# Patient Record
Sex: Male | Born: 1970 | Race: Black or African American | Hispanic: No | Marital: Single | Smoking: Current every day smoker
Health system: Southern US, Community
[De-identification: ages and names within clinical notes are randomized; demographics above are authoritative.]

## PROBLEM LIST (undated history)

## (undated) DIAGNOSIS — E876 Hypokalemia: Secondary | ICD-10-CM

## (undated) DIAGNOSIS — I209 Angina pectoris, unspecified: Secondary | ICD-10-CM

## (undated) DIAGNOSIS — I10 Essential (primary) hypertension: Secondary | ICD-10-CM

## (undated) DIAGNOSIS — I219 Acute myocardial infarction, unspecified: Secondary | ICD-10-CM

## (undated) DIAGNOSIS — I6529 Occlusion and stenosis of unspecified carotid artery: Secondary | ICD-10-CM

## (undated) DIAGNOSIS — F419 Anxiety disorder, unspecified: Secondary | ICD-10-CM

## (undated) DIAGNOSIS — E785 Hyperlipidemia, unspecified: Secondary | ICD-10-CM

## (undated) DIAGNOSIS — D649 Anemia, unspecified: Secondary | ICD-10-CM

## (undated) DIAGNOSIS — G4730 Sleep apnea, unspecified: Secondary | ICD-10-CM

## (undated) DIAGNOSIS — W3400XA Accidental discharge from unspecified firearms or gun, initial encounter: Secondary | ICD-10-CM

## (undated) DIAGNOSIS — I509 Heart failure, unspecified: Secondary | ICD-10-CM

## (undated) DIAGNOSIS — I2510 Atherosclerotic heart disease of native coronary artery without angina pectoris: Secondary | ICD-10-CM

## (undated) DIAGNOSIS — Z9289 Personal history of other medical treatment: Secondary | ICD-10-CM

## (undated) DIAGNOSIS — R569 Unspecified convulsions: Secondary | ICD-10-CM

## (undated) HISTORY — DX: Personal history of other medical treatment: Z92.89

## (undated) HISTORY — PX: HERNIA REPAIR: SHX51

## (undated) HISTORY — DX: Atherosclerotic heart disease of native coronary artery without angina pectoris: I25.10

## (undated) HISTORY — DX: Hyperlipidemia, unspecified: E78.5

## (undated) HISTORY — DX: Occlusion and stenosis of unspecified carotid artery: I65.29

## (undated) HISTORY — PX: KNEE SURGERY: SHX244

## (undated) HISTORY — PX: UMBILICAL HERNIA REPAIR: SHX196

---
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
## 2004-04-15 ENCOUNTER — Emergency Department (HOSPITAL_COMMUNITY): Admission: EM | Admit: 2004-04-15 | Discharge: 2004-04-15 | Payer: Self-pay | Admitting: Family Medicine

## 2004-10-01 ENCOUNTER — Inpatient Hospital Stay (HOSPITAL_COMMUNITY): Admission: EM | Admit: 2004-10-01 | Discharge: 2004-10-02 | Payer: Self-pay | Admitting: Emergency Medicine

## 2004-10-05 ENCOUNTER — Inpatient Hospital Stay (HOSPITAL_COMMUNITY): Admission: EM | Admit: 2004-10-05 | Discharge: 2004-10-07 | Payer: Self-pay | Admitting: Emergency Medicine

## 2004-10-29 ENCOUNTER — Emergency Department (HOSPITAL_COMMUNITY): Admission: EM | Admit: 2004-10-29 | Discharge: 2004-10-30 | Payer: Self-pay | Admitting: Emergency Medicine

## 2005-05-08 HISTORY — PX: CORONARY ANGIOPLASTY WITH STENT PLACEMENT: SHX49

## 2007-05-25 ENCOUNTER — Emergency Department (HOSPITAL_COMMUNITY): Admission: EM | Admit: 2007-05-25 | Discharge: 2007-05-25 | Payer: Self-pay | Admitting: Emergency Medicine

## 2008-03-17 ENCOUNTER — Observation Stay (HOSPITAL_COMMUNITY): Admission: EM | Admit: 2008-03-17 | Discharge: 2008-03-17 | Payer: Self-pay | Admitting: Emergency Medicine

## 2010-09-20 NOTE — H&P (Signed)
NAMENICCO, YAP           ACCOUNT NO.:  [id number]   MEDICAL RECORD NO.:  [id number]          PATIENT TYPE:  INP   LOCATION:  3707                         FACILITY:  [facility]   PHYSICIAN:  Belva Crome, M.D.   DATE OF BIRTH:  25-Sep-1970   DATE OF ADMISSION:  03/17/2008  DATE OF DISCHARGE:  03/17/2008                              HISTORY & PHYSICAL   CHIEF COMPLAINT:  Chest pain.   Mr. Chiappetta is a 40 year old male patient who has known coronary artery  disease.  He has a drug-eluting stent to the OM1 and a bare-metal stent  to the circumflex, which were placed in 2006.  He began having  substernal chest pain/pressure around 5:00 a.m. today.  He said it was a  pressure sensation and he had difficulty breathing.  He took a  sublingual nitroglycerin that helped somewhat, but he took it out of his  mouth before it fully dissolved.  He said it did give him a headache and  his pain did get somewhat better.  He says now that he feels better but  feels funny.  He drinks up to a fifth a liquor daily and he drank last  night and he took some ecstasy.  He denies any cocaine.   PAST MEDICAL HISTORY:  Hypertension and hyperlipidemia.   SOCIAL HISTORY:  Lives in [place], smokes two packs of cigarettes per  day.  Drinks up to fifth a liquor a day, uses ecstasy.   FAMILY HISTORY:  Dad had heart disease in his 34s.   ALLERGIES:  No known drug allergies.   MEDICATIONS:  None.   REVIEW OF SYSTEMS:  As above, otherwise negative.   PHYSICAL EXAM:  VITAL SIGNS:  Temperature 97.8, pulse 67, respirations  20, blood pressure 158/92, and O2 saturations 97% on room air.  GENERAL:  He is in no acute distress.  HEENT:  Grossly normal.  No carotid or subclavian bruits.  No JVD or  thyromegaly.  Sclerae clear.  Conjunctivae normal.  Nares without  drainage.  CHEST:  Clear to auscultation bilaterally.  No wheezing or rhonchi.  HEART:  Regular rate and rhythm.  No gross murmur.  ABDOMEN:  Good  bowel sounds, nontender, nontender, no abdominal masses.  EXTREMITIES:  No peripheral edema.  SKIN:  Warm and dry.  NEUROLOGIC:  Cranial nerves II through XII intact.  Normal mood and  affect, although he does smell of alcohol.   Chest x-ray, no active disease.   Labs today showed hemoglobin 16.8, hematocrit 50.5, and platelets 230,  white count 6.8.  Sodium 139, potassium 3.0, replete, BUN 9, creatinine  1.2, glucose 123, alcohol level 111.  EKG, normal sinus rhythm in the  80s within normal limits.   ASSESSMENT AND PLAN:  1. Chest pain.  2. Coronary artery disease.  3. Polysubstance abuse.   We will need to admit him to the hospital with cycle enzymes.  We will  get a urine drug screen as well as a Education officer, museum consult.  The patient  has been seen and examined by Dr. Daneen Schick.      Joesphine Bare, P.A.  Belva Crome, M.D.  Electronically Signed    LB/MEDQ  D:  03/17/2008  T:  03/17/2008  Job:  [id number]   cc:   Barnett Abu, M.D.

## 2010-09-23 NOTE — Cardiovascular Report (Signed)
Scott Salinas, Scott Salinas           ACCOUNT NO.:  [id number]   MEDICAL RECORD NO.:  [id number]          PATIENT TYPE:  INP   LOCATION:  6524                         FACILITY:  [facility]   PHYSICIAN:  Barnett Abu, M.D.  DATE OF BIRTH:  1971-03-28   DATE OF PROCEDURE:  10/06/2004  DATE OF DISCHARGE:                              CARDIAC CATHETERIZATION   PROCEDURES PERFORMED:  1.  Left heart catheterization.  2.  Coronary angiography.  3.  Left ventriculogram.  4.  PCI/drug-eluting stent implantation, first marginal branch.  5.  PCI/bare metal stent implantation, proximal circumflex.  6.  Percutaneous closure of right femoral artery.   INDICATIONS:  Scott Salinas is a 40 year old man who presented to [facility] initially one week ago complaining of chest pain. It  initially had exertion and then becoming  at rest. Troponin was mildly  elevated. An exercise treadmill test reproduced symptoms within the first  stage of Bruce protocol, and there was 1 mm ST-segment depression. Perfusion  images associated with this showed a mild reversible inferolateral defect.  He was scheduled for cardiac catheterization but signed out AMA. Two days  later he returned to the hospital, continuing to complain of chest pain.  Again, he was treated with heparin and IV nitroglycerin as well as  Integrilin. He did have another increase in his troponin enzymes. He is  brought to catheterization laboratory at this time to identify the extent of  disease and provide for further therapeutic options.   PROCEDURAL NOTE:  The patient brought to the cardiac catheterization  laboratory in a fasting state. The right groin was prepped and draped in  usual sterile fashion. Local anesthesia was obtained with the infiltration  of 1% lidocaine. A 6-French catheter sheath was inserted percutaneously into  the right femoral artery, utilizing an anterior approach over a guiding J-  wire. Left heart  catheterization then proceeded in the standard fashion,  with a 110 cm pigtail catheter and 6-French #4 left and right Judkins  catheters. All catheter manipulations were  performed using fluoroscopic  observation, and exchanges performed over a long guiding J-wire. At the  completion of the diagnostic procedure, the decision was made to proceed  with percutaneous coronary intervention.   The 6-French catheter sheath was exchanged for a 7-French catheter sheath  over a long  guiding J-wire. The patient received 6300 units of intravenous  heparin, for an ACT initially of approximately 170. He did arrive in the  catheterization laboratory on heparin and intravenous Integrilin. The  Integrilin was continued. The resultant ACT after 6100 units of heparin was  190. A 7-French #4 Voda left guiding catheter was advanced to the descending  aorta, where left coronary os was engaged. A 0.014 inch Luge intracoronary  guidewire was passed across the lesion to the proximal to mid circumflex  without difficulty, and on into a very large second marginal. The first  marginal was very small. The lesion in the mid circumflex was primarily  stented using a 4.0 x 16 mm SciMed Liberte'. This was carefully placed  across the lesion and deployed at peak pressure of  14 atm for approximately  35 sec. The stent balloon was deflated and removed. The patient received two  intracoronary doses of 0.2 mg nitroglycerin. A moderate stenosis in  the  first marginal appeared to have worsened/was more severe. This might be due  to nitroglycerin causing increased dilatation of the more normal artery or  because of increased flow. I decided to treat this lesion as well. A 3.20  x  28 mm SciMed Taxus drug-eluting intracoronary stent was advanced into place,  carefully positioned and deployed at peak pressure of 14 atm for 39 sec. The  stent balloon was removed and a 3.5 x 15 mm SciMed Quantum Maverick  intracoronary balloon was  advanced into place. The artery was larger in the  more distal portion of the stent than the proximal portion. Therefore the  3.5 mm diameter balloon was used in the more mid to distal portion of the  stent for post dilatation. It was inflated at 12 atm for 56 sec. It was  deflated and removed. Following confirmation of adequate patency in  orthogonal views, both with and without the guidewire in place, the guiding  catheter was removed.   A right femoral arteriogram in the 45 degree RAO angulation was performed,  via the catheter sheath by hand injection. It documented adequate anatomy  for placement of the percutaneous closure device AngioSeal. This was  deployed successfully with good hemostasis and an intact distal pulse.   HEMODYNAMIC RESULTS:  1.  Systemic arterial pressure:  115/77 with a mean of 96 mmHg.  2.  There was no systolic gradient across the aortic valve.  3.  Left ventricular end-diastolic pressure was 13 mmHg.   ANGIOGRAPHY:  The left ventriculogram demonstrated normal chamber size and  normal global systolic function. There was dense inferoposterior/  diaphragmatic akinesis. The basal portion of the inferior wall moved  normally, as did the apical inferior wall and the inferolateral wall.  A  visual estimate of the ejection fraction is 50-55%.  There was no  significant mitral regurgitation.  There was no significant coronary  calcification.   There was a right dominant coronary system present; although the right  coronary was rather small.   1.  LEFT MAIN CORONARY ARTERY:  Short, large and normal.  2.  LEFT ANTERIOR DESCENDING ARTERY: (and its branches) Minimally diseased;      there are luminal irregularities in the vessel and there is a 30%      stenosis at the junction of the mid and distal segments,  after the      origin of the third diagonal branch (which follows shortly after the      second diagonal branch). The third diagonal branch itself has 50%      stenosis about 1 cm from its origin. Both the second and third diagonal      branches are relatively small.  The first diagonal branch is moderate in      size. The ongoing anterior descending artery is large, transapical and      diffusely diseased, but not obstructed at all.  3.  LEFT CIRCUMFLEX CORONARY ARTERY: (and its branches) Highly diseased; the      vessel gives rise proximally to a tiny marginal branch. The anatomic      second marginal branch is large and dominant vessel on the the lateral      and  the inferolateral wall of the heart. The ongoing circumflex in the      AV  groove gives rise to only a small posterolateral branch. In the      proximal segment there is a focal concentric 90% stenosis. Initial      diagnostic angiography,  the lesion in the marginal branch is diffuse;      but it did not appear to be than 50% stenotic in any segment.  4.  RIGHT CORONARY ARTERY:  Small but anatomically dominant. It gives rise      to a very small posterior descending artery, which does not leave the      basal segment of the LV inferoseptal wall.  Collateral vessels are not      seen.   Following balloon dilatation and stent implantation,  there is a 10%  residual stenosis in the proximal circumflex. There is no residual stenosis  in the marginal branch.   FINAL IMPRESSION:  1.  Atherosclerotic coronary vascular disease, single vessel.  2.  Intact global left ventricular systolic function with regional wall      motion abnormalities noted.  3.  Typical angina was reproduced with device insertion and balloon      inflation.      JHE/MEDQ  D:  10/06/2004  T:  10/06/2004  Job:  [id number]

## 2010-09-23 NOTE — Discharge Summary (Signed)
NAME:  ADRICK, SCHNEIDERS           ACCOUNT NO.:  [id number]   MEDICAL RECORD NO.:  [id number]          PATIENT TYPE:  INP   LOCATION:  [id number]                         FACILITY:  [facility]   PHYSICIAN:  Corinna L. Conley Canal, MDDATE OF BIRTH:  1971-02-06   DATE OF ADMISSION:  09/30/2004  DATE OF DISCHARGE:  10/02/2004                                 DISCHARGE SUMMARY   The patient left against medical advice on Oct 02, 2004.   DIAGNOSES:  1.  Acute coronary syndrome.  2.  Hypertension.  3.  Dyslipidemia.  4.  Tobacco abuse.   HISTORY AND HOSPITAL COURSE:  Mr. Wohlwend is a 40 year old black male who  presented with chest pain. Please see H&P for admission details. Despite his  young age, he did rule in for myocardial infarction by enzymes. His peak  troponin was 7.29, peak CPK was nearly 2000. Peak CPK-MB was 44. His EKG on  admission showed sinus bradycardia with a rate of 53, nonspecific changes.  Serial EKGs were done and were unchanged. The patient was started on  aspirin, nitroglycerin, beta-blocker, Lovenox. Cardiology was consulted and  performed Cardiolite which showed reversible ischemia to the mid lateral  wall, hypokinesia of the mid inferior and lateral walls, and ejection  fraction of 39%. The patient was to have a cardiac catheterization and left  against medical advice before this could be done.       CLS/MEDQ  D:  11/23/2004  T:  11/23/2004  Job:  [id number]

## 2010-09-23 NOTE — H&P (Signed)
Scott Salinas, Scott Salinas           ACCOUNT NO.:  [id number]   MEDICAL RECORD NO.:  [id number]          PATIENT TYPE:  INP   LOCATION:  [id number]                         FACILITY:  [facility]   PHYSICIAN:  Sharlet Salina, M.D.   DATE OF BIRTH:  [birth year]   DATE OF ADMISSION:  09/30/2004  DATE OF DISCHARGE:                                HISTORY & PHYSICAL   CHIEF COMPLAINT:  Chest pain.   HISTORY OF PRESENT ILLNESS:  The patient is a 40 year old, African-American  male with no significant past medical history. The patient presented to the  ED with chest pain with activity that has been going on for the past 3  weeks. It got worse today after he climbed one flight of stairs. He was  diaphoretic, short of breath, and pain lasted for approximately 45 minutes.  This is worse on the right side of the chest, and worse with deep breathing.  No nausea or vomiting. This pain is not related to food. He had some chest  wall tenderness. He smokes about 2.5 packs of cigarettes per day. He does  not use any IV drugs, no cocaine or steroids. He does smoke marijuana.   PAST MEDICAL HISTORY:  None.   FAMILY HISTORY:  Mother and father are both healthy. Mother is 33, father is  23.   SOCIAL HISTORY:  He smoked 2.5 packs tobacco per day. Drinks socially. No IV  drug use, no steroids. Positive for marijuana. Two children. Presently not  employed. He has a brother and a sister that are healthy.   MEDICATIONS:  None.   ALLERGIES:  No known drug allergies.   REVIEW OF SYSTEMS:  As per stated in the HPI.   PHYSICAL EXAMINATION:  Temperature 97.3, blood pressure 137/79, pulse 82,  respirations 24, pulse oximetry 98% on room air.  HEENT:  Head is normocephalic, atraumatic. Pupils are equal, round and  reactive to light. Throat without erythema.  CARDIOVASCULAR:  Regular rate and rhythm, no murmurs, rubs, or gallops.  ABDOMEN:  Positive bowel sounds, soft, nontender, nondistended.  EXTREMITIES:  Without  edema.  LUNGS:  Clear to auscultation bilaterally, no wheezes, rhonchi, rales.   LABORATORY DATA:  WBC 6200, hemoglobin 15.2, platelets 301,000. Sodium 136,  potassium 2.5, chloride 105, CO2 24, BUN 14, creatinine 1.2, glucose 102.  AST 51, ALT 42. UDS positive for marijuana. ECG negative. Myoglobin elevated  at 391 and 480 with MBs of 7.9 and 8.0. Troponin negative.   ASSESSMENT/PLAN:  1.  Chest pain. Differential diagnoses - musculoskeletal versus pulmonary      embolism, versus cardiac, versus gastroesophageal reflux disease. Will      observe. Will rule out all the above. Will cycle enzymes, TSH, fasting      lipid panel, 2-D echocardiogram. Will get a cardiology consultation for      a treadmill stress-test in the morning.   1.  Increased liver function tests. Will recheck in the morning. If elevated      will check an acute hepatitis panel.   1.  Pre-hypertension. Blood pressure is 137/79, will continue to monitor.      Could  be secondary to stress reaction.   1.  Tobacco abuse. Will discuss with him smoking cessation programs.      NJ/MEDQ  D:  10/01/2004  T:  10/01/2004  Job:  [id number]

## 2010-09-23 NOTE — Discharge Summary (Signed)
NAMEADRYAN, Scott Salinas           ACCOUNT NO.:  [id number]   MEDICAL RECORD NO.:  [id number]          PATIENT TYPE:  INP   LOCATION:  [id number]                         FACILITY:  [facility]   PHYSICIAN:  Belva Crome, M.D.   DATE OF BIRTH:  1970-10-04   DATE OF ADMISSION:  03/17/2008  DATE OF DISCHARGE:  03/17/2008                               DISCHARGE SUMMARY   DISCHARGE DIAGNOSES:  1. Chest pain.  2. Coronary artery disease.  3. Polysubstance abuse.   [name] is a 40 year old male patient who has known coronary artery  disease.  He had a drug-eluting stent to the OM1 in the past as well as  a bare-metal stent to the circumflex in 2006.  He began having chest  pain at 5 a.m. on the date of admission.  He complains of pressure and  difficulty breathing.  He took sublingual nitroglycerin that helped  somewhat, but he took it out of his mouth before it completely  dissolved.  He said that it did give him a headache.  He states he feels  better but feels funny.  He drinks up to a 1/5 of liquor daily.  He  drank last night and had some ecstasy.   We admitted him to make sure he ruled out from myocardial infarction and  to possibly even cath him if need be.  Lab studies during his  hospitalization included urine-drug screen that was positive for  cannabinoids.  Otherwise, studies include total cholesterol 187, HDL 38,  LDL 135, triglycerides 66.  Initial point of care marker was negative.  Total CK was 1158 with a MB fraction of 7.5, relative index of 0.6, and  troponin was normal.  His alcohol level was 111.   During the patient's admission, he found it was imperative that he  leave.  He was asked to sign leaving hospital against medical advice  form, he did so voluntarily and he was then discharged to home.      Joesphine Bare, P.A.      Belva Crome, M.D.     LB/MEDQ  D:  04/28/2008  T:  04/29/2008  Job:  [id number]

## 2010-09-23 NOTE — Discharge Summary (Signed)
Scott Salinas, Scott Salinas           ACCOUNT NO.:  [id number]   MEDICAL RECORD NO.:  [id number]          PATIENT TYPE:  INP   LOCATION:  [id number]                         FACILITY:  [facility]   PHYSICIAN:  Barnett Abu, M.D.  DATE OF BIRTH:  22-Aug-1970   DATE OF ADMISSION:  10/04/2004  DATE OF DISCHARGE:  10/07/2004                                 DISCHARGE SUMMARY   CHIEF COMPLAINT AND REASON FOR ADMISSION:  Mr. Christine is a 40 year old male  patient who had been admitted by the hospitalist service on May 27 for a non-  ST segment elevated acute MI.  Please refer to the admission H&P from that  time period for specifics.  We had seen him on consultation on May 27 as  well.  He had a significant bump in his cardiac isoenzymes within eight  hours of initial symptoms.  He also had had a Cardiolite study that had  shown some ischemic changes but, unfortunately, before Dr. Leonia Reeves could  speak with the patient to arrange additional care, the patient signed out  [facility] May 28.   The patient reappeared to the ER on Oct 05, 2004, with additional episodes  of chest pain.  His troponin I in the ER was 2.07.  Because of continued  symptoms in a patient with known significantly elevated cardiac isoenzymes  and non-ST elevated MI with abnormal Cardiolite study, he was admitted for  further treatment and evaluation.   HOSPITAL COURSE:  Problem 1. CHEST PAIN WITH ASSOCIATED NON-ST SEGMENT ELEVATED MYOCARDIAL  INFARCTION WITH POSITIVE ENZYMES:  The patient was admitted to the cardiac  step-down unit, where cardiac isoenzymes were cycled.  He was begun on  aspirin, IV heparin, IV Integrilin and IV nitroglycerin, started on  metoprolol and Lipitor.  He was evaluated by Dr. Leonia Reeves on the following  day.  He was chest pain-free at that time.  His EKG showed ST segment  depression in the inferior leads and the Cardiolite that had been done on  May 28 showed a reversible inferolateral defect.  The patient told  Dr.  Leonia Reeves that he left because he was scared.  Because the patient was having  symptoms of a stuttering non-ST segment elevated MI and noting that his  troponin I peaked on May 31 at 10.17, he had planned on taking the patient  to the cardiac catheterization lab.  Unfortunately, on the morning of May  31, there were no openings in the catheterization lab so the patient was  continued on his admitting medications and was subsequently taken to the  catheterization lab on October 06, 2004, where he underwent left heart  catheterization.  EF was 50% with an inferior posterior akinesis.  Left  circumflex had a 90% proximal lesion and the OM-1 had an 80% diffuse lesion.  He subsequently underwent PCI to both of these lesions with a Taxus stent  implantation in the OM-1 and a Liberty bare metal stent in the proximal left  circumflex.  The patient tolerated the procedure well, was continued on  Integrilin for 18 hours post procedure and had good hemostasis after Angio-  Seal  with intact distal pulses.   On the date of discharge, the patient was very stable, no further chest  pain, the right groin was unremarkable except for some fullness over the  actual right artery area consistent with soft tissue swelling.  Blood  pressure was 125/64.  At sleep, patient's heart rate would go down into the  mid-40s, around 45.  When awake, the patient's heart rate ranged from 52 to  70.  The patient otherwise was asymptomatic and eager to be discharged home.   Problem 2. DYSLIPIDEMIA:  The patient had a fasting statin panel drawn on  the initial admission in late May.  Cholesterol was 232, triglycerides 164,  HDL cholesterol 40, LDL 159.  The patient has been started on Zocor this  admission, and this will continue after discharge with recommended statin  panel in six weeks, noting that on admission the patient had mildly elevated  ASTs and ALTs.  This was suspected due to the stuttering MI that was in   progress.   Problem 3. TOBACCO ABUSE:  The patient has been a heavy smoker for many  years, 2-1/2 packs per day.  The patient has received smoking cessation  counseling on the previous hospitalization as well as this hospitalization.  Also note that on the previous admission as well as this admission, there  was some concern that due to the patient's young age he may have other  substances he was abusing. Urine drug screen was positive for marijuana  metabolites.   Problem 4. NONCOMPLIANCE:  As noted on the previous admission, the patient  became frightened and left AMA prior to receiving adequate treatment for his  serious medical conditions.  Today before I evaluated the patient, he was  eager to be discharged home and told the nursing staff that he would leave  AMA if he did not see a physician soon.  I did explain to the patient that  Dr. Leonia Reeves was tied up doing cardiac catheterization procedures up until  lunch time, and he would be seeing Dr. Leonia Reeves after that.  He was amenable  to waiting for Dr. Leonia Reeves as instructed.   FINAL DISCHARGE DIAGNOSES:  1.  Non-ST segment elevated myocardial infarction, stuttering, initial      presentation Oct 01, 2004.  2.  Status post bare metal stent to the proximal circumflex as well as Taxus      stent to the obtuse marginal 1 after percutaneous coronary balloon      angioplasty.  3.  Dyslipidemia with elevated low density lipoprotein cholesterol, 159, and      total cholesterol elevated at 232.  4.  Tobacco abuse.  5.  Issues related to medical noncompliance and leaving against medical      advice.   DISCHARGE MEDICATIONS:  1.  Lopressor 50 mg tablets one-half tablet twice daily.  2.  Enteric-coated aspirin 325 mg daily.  3.  Plavix 75 mg daily.  4.  Zocor 20 mg daily.   ACTIVITY:  No lifting more than 10 pounds the next two days.  The patient is  unemployed so does not need note to return to work.  DIET:  Heart-healthy.   WOUND  CARE:  Shower only for the next two days.  You are to call cardiology  at [phone] if you have any increase in swelling or bruising of the right  groin or leg.   FOLLOW-UP APPOINTMENT:  It is recommended that he follow up with the nurse  practitioner at Dr.  Leonia Reeves' office in one week for a groin check.  He needs  to see Dr. Leonia Reeves in the next two to three weeks and have a fasting  cholesterol panel done in six weeks.  Our office needs to call this patient  in regard to these appointment times because our computers were down at time  of discharge and I was unable to schedule these appointments.  In addition,  the patient has been given written instructions to not eat or drink anything  the night before or the morning of his cholesterol panel.      ALE/MEDQ  D:  10/07/2004  T:  10/07/2004  Job:  [id number]

## 2010-09-23 NOTE — H&P (Signed)
NAMEAADHAVAN, CLYATT           ACCOUNT NO.:  [id number]   MEDICAL RECORD NO.:  [id number]          PATIENT TYPE:  INP   LOCATION:  [id number]                         FACILITY:  [facility]   PHYSICIAN:  Broadus John, MD DATE OF BIRTH:  [birth year]   DATE OF ADMISSION:  10/04/2004  DATE OF DISCHARGE:                                HISTORY & PHYSICAL   REASON FOR ADMISSION:  Scott Salinas is a 40 year old black man who was  admitted on Oct 01, 2004 for a non-ST segment elevation, acute myocardial  infarction. He signed out [facility] on Oct 02, 2004. He is now readmitted after  experiencing further chest pain today. He reports intermittent episodes of  chest pain, lasting several hours at a time, throughout the course of the  day. The chest pain is described as a substernal left anterior chest  pressure which radiates to the left arm. It is associated with dyspnea but  no diaphoresis or nausea. It seems to be exacerbated by activity such as  walking. He has also experienced chest pain at rest. It is the same chest  pain that prompted his admission four days ago to this hospital. There are  no other exacerbating or ameliorating factors. Please refer to the original  history and physical and cardiology consultation note for further background  details.  He had been scheduled for a cardiac catheterization but obviously  this did not transpire since he left the hospital AMA.   PAST MEDICAL HISTORY:  1.  Hypertension.  2.  Dyslipidemia.  3.  Polysubstance abuse including alcohol, tobacco, and illicit drugs.   PHYSICAL EXAMINATION:  VITAL SIGNS:  Blood pressure 138/73, pulse 68 and  regular, respirations are 22. Temperature 98.5. Pulse oximeter 96% on room  air.  GENERAL:  The patient was a young, black male in no discomfort. He was alert  and oriented.  HEENT:  Unremarkable.  NECK:  Without thyromegaly or adenopathy. Carotid pulses were palpable  bilaterally and without bruits.  CARDIOVASCULAR:  Normal S1 and S2. There was no S3, S4, murmur, rub, or  click. Cardiac rhythm was regular.  CHEST:  No chest wall tenderness noted.  LUNGS:  Clear.  ABDOMEN:  Soft and nontender. There was no mass, hepatosplenomegaly, bruit,  distension, rebound, guarding, or rigidity. Bowel sounds were normal.  RECTAL:  Not performed as they were not pertinent to the reason for acute  care hospitalization.  GENITAL:  Not performed as they were not pertinent to the reason for acute  care hospitalization.  EXTREMITIES:  Without edema, deviation, or deformity. Radial and dorsalis  pedis pulses were palpable bilaterally.  NEUROLOGICAL:  Brief screening neurologic survey was unremarkable.   LABORATORY DATA:  Electrocardiogram revealed normal sinus rhythm at a rate  of 67 beats per minute. There was ST segment depression in the inferior and  anterolateral leads. Chest radiograph report was pending at the time of this  dictation. Myoglobin was 187 with a CK-MB of 7.8 and troponin of 2.07,  potassium 3.9, BUN 9, creatinine 1.1. Remaining studies were pending at the  time of this dictation.   IMPRESSION:  1.  Acute non-ST segment elevation myocardial infarction.  2.  Hypertension.  3.  Ischemia.  4.  Polysubstance abuse.   PLAN:  1.  Cardiac stepdown unit.  2.  Serial cardiac enzymes.  3.  Aspirin.  4.  Intravenous heparin.  5.  Intravenous Integrilin.  6.  Intravenous nitroglycerin.  7.  Metoprolol.  8.  Lipitor.  9.  Discontinue smoking and other substances of abuse.  10. Further measures per Dr. Barnett Abu.      MSC/MEDQ  D:  10/05/2004  T:  10/05/2004  Job:  [id number]   cc:   Barnett Abu, M.D.  [address]  [address]  [place] [address]  Fax: [phone]

## 2010-10-20 ENCOUNTER — Emergency Department (HOSPITAL_COMMUNITY): Payer: Self-pay

## 2010-10-20 ENCOUNTER — Emergency Department (HOSPITAL_COMMUNITY)
Admission: EM | Admit: 2010-10-20 | Discharge: 2010-10-20 | Disposition: A | Discharge status: Home / Self Care | Payer: Self-pay | Attending: Emergency Medicine | Admitting: Emergency Medicine

## 2010-10-20 ENCOUNTER — Inpatient Hospital Stay (INDEPENDENT_AMBULATORY_CARE_PROVIDER_SITE_OTHER)
Admission: RE | Admit: 2010-10-20 | Discharge: 2010-10-20 | Disposition: A | Discharge status: Home / Self Care | Payer: Self-pay | Source: Ambulatory Visit | Attending: Family Medicine | Admitting: Family Medicine

## 2010-10-20 DIAGNOSIS — W2209XA Striking against other stationary object, initial encounter: Secondary | ICD-10-CM | POA: Insufficient documentation

## 2010-10-20 DIAGNOSIS — T148XXA Other injury of unspecified body region, initial encounter: Secondary | ICD-10-CM

## 2010-10-20 DIAGNOSIS — S61209A Unspecified open wound of unspecified finger without damage to nail, initial encounter: Secondary | ICD-10-CM | POA: Insufficient documentation

## 2010-10-20 DIAGNOSIS — M79609 Pain in unspecified limb: Secondary | ICD-10-CM | POA: Insufficient documentation

## 2010-10-20 DIAGNOSIS — I10 Essential (primary) hypertension: Secondary | ICD-10-CM | POA: Insufficient documentation

## 2010-10-20 DIAGNOSIS — R45850 Homicidal ideations: Secondary | ICD-10-CM

## 2010-10-20 DIAGNOSIS — I252 Old myocardial infarction: Secondary | ICD-10-CM | POA: Insufficient documentation

## 2010-10-20 DIAGNOSIS — F3289 Other specified depressive episodes: Secondary | ICD-10-CM | POA: Insufficient documentation

## 2010-10-20 DIAGNOSIS — F329 Major depressive disorder, single episode, unspecified: Secondary | ICD-10-CM | POA: Insufficient documentation

## 2010-10-20 LAB — ETHANOL: Alcohol, Ethyl (B): <11 mg/dL — ABNORMAL HIGH (ref 0–10)

## 2010-10-20 LAB — COMPREHENSIVE METABOLIC PANEL
ALT: 41 U/L (ref 0–53)
AST: 46 U/L — ABNORMAL HIGH (ref 0–37)
BUN: 15 mg/dL (ref 6–23)
CO2: 22 mEq/L (ref 19–32)
Chloride: 103 mEq/L (ref 96–112)
Potassium: 3.7 mEq/L (ref 3.5–5.1)
Sodium: 137 mEq/L (ref 135–145)
Total Protein: 9 g/dL — ABNORMAL HIGH (ref 6.0–8.3)

## 2010-10-20 LAB — RAPID URINE DRUG SCREEN, HOSP PERFORMED
Amphetamines: NOT DETECTED
Barbiturates: NOT DETECTED
Benzodiazepines: NOT DETECTED
Cocaine: NOT DETECTED
Opiates: NOT DETECTED

## 2010-10-20 LAB — DIFFERENTIAL
Eosinophils Relative: 0 % (ref 0–5)
Lymphocytes Relative: 39 % (ref 12–46)
Monocytes Relative: 8 % (ref 3–12)
Neutro Abs: 3.4 10*3/uL (ref 1.7–7.7)
Neutrophils Relative %: 53 % (ref 43–77)

## 2010-10-20 LAB — CBC: Hemoglobin: 16.3 g/dL (ref 13.0–17.0)

## 2011-01-26 LAB — GC/CHLAMYDIA PROBE AMP, GENITAL: GC Probe Amp, Genital: NEGATIVE

## 2011-01-26 LAB — HIV ANTIBODY (ROUTINE TESTING W REFLEX): HIV: NONREACTIVE

## 2011-01-26 LAB — RPR: RPR Ser Ql: NONREACTIVE

## 2011-01-26 LAB — HEPATITIS B SURFACE ANTIBODY,QUALITATIVE: Hep B S Ab: NEGATIVE

## 2011-01-26 LAB — HERPES SIMPLEX VIRUS CULTURE

## 2011-02-07 LAB — POCT I-STAT, CHEM 8
BUN: 9
Creatinine, Ser: 1.2
Hemoglobin: 17.7 — ABNORMAL HIGH
Potassium: 3 — ABNORMAL LOW
Sodium: 139
TCO2: 18

## 2011-02-07 LAB — DIFFERENTIAL
Basophils Absolute: 0.1
Eosinophils Absolute: 0
Lymphs Abs: 1.5
Monocytes Absolute: 0.4
Neutrophils Relative %: 71

## 2011-02-07 LAB — URINALYSIS, ROUTINE W REFLEX MICROSCOPIC
Bilirubin Urine: NEGATIVE
Hgb urine dipstick: NEGATIVE
Ketones, ur: NEGATIVE
Protein, ur: NEGATIVE
Specific Gravity, Urine: 1.008

## 2011-02-07 LAB — CBC
HCT: 50.5
Hemoglobin: 16.8
MCHC: 33.3
MCV: 97
Platelets: 230

## 2011-02-07 LAB — RAPID URINE DRUG SCREEN, HOSP PERFORMED
Amphetamines: NOT DETECTED
Barbiturates: NOT DETECTED
Tetrahydrocannabinol: POSITIVE — AB

## 2011-02-07 LAB — LIPID PANEL
Cholesterol: 187
HDL: 38 — ABNORMAL LOW
Triglycerides: 68

## 2011-02-07 LAB — DRUG SCREEN PANEL (SERUM)
Methadone (Dolophine), Serum: NEGATIVE
Opiates, Blood: NEGATIVE
Phencyclidine, Serum: NEGATIVE
Propoxyphene,Serum: NEGATIVE

## 2011-02-07 LAB — CK TOTAL AND CKMB (NOT AT ARMC)
CK, MB: 7.5 — ABNORMAL HIGH
Total CK: 1158 — ABNORMAL HIGH

## 2011-02-07 LAB — POCT CARDIAC MARKERS
CKMB, poc: 5.3
Myoglobin, poc: 193
Troponin i, poc: <0.05

## 2011-02-07 LAB — TROPONIN I: Troponin I: 0.02

## 2011-03-06 ENCOUNTER — Emergency Department (HOSPITAL_COMMUNITY)
Admission: EM | Admit: 2011-03-06 | Discharge: 2011-03-06 | Disposition: A | Discharge status: Home / Self Care | Payer: Self-pay | Attending: Emergency Medicine | Admitting: Emergency Medicine

## 2011-03-06 DIAGNOSIS — M545 Low back pain, unspecified: Secondary | ICD-10-CM | POA: Insufficient documentation

## 2011-03-06 DIAGNOSIS — Y9229 Other specified public building as the place of occurrence of the external cause: Secondary | ICD-10-CM | POA: Insufficient documentation

## 2011-03-06 DIAGNOSIS — E669 Obesity, unspecified: Secondary | ICD-10-CM | POA: Insufficient documentation

## 2011-03-06 DIAGNOSIS — S335XXA Sprain of ligaments of lumbar spine, initial encounter: Secondary | ICD-10-CM | POA: Insufficient documentation

## 2011-03-06 DIAGNOSIS — I252 Old myocardial infarction: Secondary | ICD-10-CM | POA: Insufficient documentation

## 2011-03-06 DIAGNOSIS — M79609 Pain in unspecified limb: Secondary | ICD-10-CM | POA: Insufficient documentation

## 2011-03-06 DIAGNOSIS — X500XXA Overexertion from strenuous movement or load, initial encounter: Secondary | ICD-10-CM | POA: Insufficient documentation

## 2011-03-06 DIAGNOSIS — I10 Essential (primary) hypertension: Secondary | ICD-10-CM | POA: Insufficient documentation

## 2013-02-02 ENCOUNTER — Encounter (HOSPITAL_COMMUNITY): Payer: Self-pay | Admitting: Nurse Practitioner

## 2013-02-02 ENCOUNTER — Emergency Department (HOSPITAL_COMMUNITY)
Admission: EM | Admit: 2013-02-02 | Discharge: 2013-02-02 | Disposition: A | Discharge status: Home / Self Care | Payer: Medicaid - Out of State | Attending: Emergency Medicine | Admitting: Emergency Medicine

## 2013-02-02 ENCOUNTER — Emergency Department (HOSPITAL_COMMUNITY): Payer: Medicaid - Out of State

## 2013-02-02 DIAGNOSIS — I2510 Atherosclerotic heart disease of native coronary artery without angina pectoris: Secondary | ICD-10-CM | POA: Insufficient documentation

## 2013-02-02 DIAGNOSIS — Z9861 Coronary angioplasty status: Secondary | ICD-10-CM | POA: Insufficient documentation

## 2013-02-02 DIAGNOSIS — Z87828 Personal history of other (healed) physical injury and trauma: Secondary | ICD-10-CM | POA: Insufficient documentation

## 2013-02-02 DIAGNOSIS — R072 Precordial pain: Secondary | ICD-10-CM | POA: Insufficient documentation

## 2013-02-02 DIAGNOSIS — E785 Hyperlipidemia, unspecified: Secondary | ICD-10-CM | POA: Insufficient documentation

## 2013-02-02 DIAGNOSIS — R059 Cough, unspecified: Secondary | ICD-10-CM | POA: Insufficient documentation

## 2013-02-02 DIAGNOSIS — I10 Essential (primary) hypertension: Secondary | ICD-10-CM | POA: Insufficient documentation

## 2013-02-02 DIAGNOSIS — F17200 Nicotine dependence, unspecified, uncomplicated: Secondary | ICD-10-CM | POA: Insufficient documentation

## 2013-02-02 DIAGNOSIS — R61 Generalized hyperhidrosis: Secondary | ICD-10-CM | POA: Insufficient documentation

## 2013-02-02 DIAGNOSIS — Z79899 Other long term (current) drug therapy: Secondary | ICD-10-CM | POA: Insufficient documentation

## 2013-02-02 DIAGNOSIS — R05 Cough: Secondary | ICD-10-CM | POA: Insufficient documentation

## 2013-02-02 DIAGNOSIS — R7989 Other specified abnormal findings of blood chemistry: Secondary | ICD-10-CM

## 2013-02-02 DIAGNOSIS — I252 Old myocardial infarction: Secondary | ICD-10-CM | POA: Insufficient documentation

## 2013-02-02 DIAGNOSIS — R748 Abnormal levels of other serum enzymes: Secondary | ICD-10-CM | POA: Insufficient documentation

## 2013-02-02 DIAGNOSIS — Z791 Long term (current) use of non-steroidal anti-inflammatories (NSAID): Secondary | ICD-10-CM | POA: Insufficient documentation

## 2013-02-02 DIAGNOSIS — R0602 Shortness of breath: Secondary | ICD-10-CM | POA: Insufficient documentation

## 2013-02-02 DIAGNOSIS — R220 Localized swelling, mass and lump, head: Secondary | ICD-10-CM | POA: Insufficient documentation

## 2013-02-02 DIAGNOSIS — F1210 Cannabis abuse, uncomplicated: Secondary | ICD-10-CM | POA: Insufficient documentation

## 2013-02-02 HISTORY — DX: Essential (primary) hypertension: I10

## 2013-02-02 HISTORY — DX: Accidental discharge from unspecified firearms or gun, initial encounter: W34.00XA

## 2013-02-02 LAB — URINE MICROSCOPIC-ADD ON

## 2013-02-02 LAB — PRO B NATRIURETIC PEPTIDE: Pro B Natriuretic peptide (BNP): 269.7 pg/mL — ABNORMAL HIGH (ref 0–125)

## 2013-02-02 LAB — CBC WITH DIFFERENTIAL/PLATELET
Basophils Absolute: 0 10*3/uL (ref 0.0–0.1)
Basophils Relative: 1 % (ref 0–1)
Eosinophils Absolute: 0.1 10*3/uL (ref 0.0–0.7)
Hemoglobin: 16.3 g/dL (ref 13.0–17.0)
MCH: 31 pg (ref 26.0–34.0)
MCHC: 35 g/dL (ref 30.0–36.0)
Neutro Abs: 2.7 10*3/uL (ref 1.7–7.7)
Neutrophils Relative %: 46 % (ref 43–77)
Platelets: 252 10*3/uL (ref 150–400)

## 2013-02-02 LAB — URINALYSIS, ROUTINE W REFLEX MICROSCOPIC
Glucose, UA: NEGATIVE mg/dL
Hgb urine dipstick: NEGATIVE
Specific Gravity, Urine: 1.024 (ref 1.005–1.030)
Urobilinogen, UA: 0.2 mg/dL (ref 0.0–1.0)
pH: 7 (ref 5.0–8.0)

## 2013-02-02 LAB — HEPATIC FUNCTION PANEL
ALT: 30 U/L (ref 0–53)
AST: 34 U/L (ref 0–37)
Albumin: 3.9 g/dL (ref 3.5–5.2)
Bilirubin, Direct: 0.1 mg/dL (ref 0.0–0.3)
Total Protein: 8.1 g/dL (ref 6.0–8.3)

## 2013-02-02 LAB — BASIC METABOLIC PANEL
Chloride: 102 mEq/L (ref 96–112)
GFR calc Af Amer: 73 mL/min — ABNORMAL LOW (ref >90)
GFR calc non Af Amer: 63 mL/min — ABNORMAL LOW (ref >90)
Potassium: 3.8 mEq/L (ref 3.5–5.1)
Sodium: 138 mEq/L (ref 135–145)

## 2013-02-02 LAB — POCT I-STAT TROPONIN I
Troponin i, poc: 0.01 ng/mL (ref 0.00–0.08)
Troponin i, poc: 0.01 ng/mL (ref 0.00–0.08)

## 2013-02-02 MED ORDER — HYDROCHLOROTHIAZIDE 12.5 MG PO TABS: 12.5000 mg | ORAL_TABLET | Freq: Every day | ORAL | Status: DC

## 2013-02-02 MED ORDER — PREDNISONE 20 MG PO TABS
60.0000 mg | ORAL_TABLET | Freq: Once | ORAL | Status: AC
  Administered 2013-02-02: 60 mg via ORAL
  Filled 2013-02-02: qty 3

## 2013-02-02 MED ORDER — FAMOTIDINE 20 MG PO TABS: 40.0000 mg | ORAL_TABLET | Freq: Every evening | ORAL | Status: DC | PRN

## 2013-02-02 MED ORDER — PREDNISONE 20 MG PO TABS: 40.0000 mg | ORAL_TABLET | Freq: Every day | ORAL | Status: DC

## 2013-02-02 MED ORDER — NITROGLYCERIN 0.4 MG SL SUBL: 0.4000 mg | SUBLINGUAL_TABLET | SUBLINGUAL | Status: DC | PRN

## 2013-02-02 MED ORDER — TRAMADOL HCL 50 MG PO TABS
50.0000 mg | ORAL_TABLET | Freq: Once | ORAL | Status: AC
  Administered 2013-02-02: 50 mg via ORAL
  Filled 2013-02-02: qty 1

## 2013-02-02 MED ORDER — ASPIRIN 81 MG PO CHEW
324.0000 mg | CHEWABLE_TABLET | Freq: Once | ORAL | Status: AC
  Administered 2013-02-02: 324 mg via ORAL
  Filled 2013-02-02: qty 4

## 2013-02-02 MED ORDER — DIPHENHYDRAMINE HCL 25 MG PO TABS: 50.0000 mg | ORAL_TABLET | ORAL | Status: DC | PRN
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
## 2013-02-02 NOTE — ED Notes (Signed)
Pt reports after he smoked marijuana on Friday he noticed numbness to his face and headache, then he began to feel sob. States yesterday he noticed his face felt swollen and he continues to feel SOB. Pt is speaking in full unlabored sentences and he is A&Ox4.

## 2013-02-02 NOTE — ED Notes (Signed)
PT states cannot provide urine sample at this time

## 2013-02-02 NOTE — ED Notes (Addendum)
Pt c/o increased facial swelling, face appears to be the same amount of swelling as initial assessment. EDPA made aware of pt concern. Continuing to encourage pt to provide urine sample, states will try to provide sample.

## 2013-02-02 NOTE — ED Notes (Signed)
EKg shown to the Dr., copies in the chart

## 2013-02-07 NOTE — ED Provider Notes (Signed)
Medical screening examination/treatment/procedure(s) were conducted as a shared visit with non-physician practitioner(s) and myself.  I personally evaluated the patient during the encounter  71M here with facial swelling. Smoked marijuana a few days ago, several of patient's friends smoked weed also and experienced similar facial swelling. Will stop lisinopril and given allergic medications.  Hx of CAD with stent placement, here with chest pain also. CP free now. Troponins normal. Stable for discharge.  Osvaldo Shipper, MD 02/07/13 [phone]

## 2014-12-26 ENCOUNTER — Emergency Department (HOSPITAL_COMMUNITY)

## 2014-12-26 ENCOUNTER — Observation Stay (HOSPITAL_COMMUNITY)
Admission: EM | Admit: 2014-12-26 | Discharge: 2014-12-29 | Disposition: A | Discharge status: Home / Self Care | Attending: Internal Medicine | Admitting: Internal Medicine

## 2014-12-26 DIAGNOSIS — I129 Hypertensive chronic kidney disease with stage 1 through stage 4 chronic kidney disease, or unspecified chronic kidney disease: Secondary | ICD-10-CM | POA: Insufficient documentation

## 2014-12-26 DIAGNOSIS — I209 Angina pectoris, unspecified: Secondary | ICD-10-CM

## 2014-12-26 DIAGNOSIS — Z8249 Family history of ischemic heart disease and other diseases of the circulatory system: Secondary | ICD-10-CM | POA: Diagnosis not present

## 2014-12-26 DIAGNOSIS — R079 Chest pain, unspecified: Principal | ICD-10-CM | POA: Insufficient documentation

## 2014-12-26 DIAGNOSIS — N179 Acute kidney failure, unspecified: Secondary | ICD-10-CM | POA: Insufficient documentation

## 2014-12-26 DIAGNOSIS — I5022 Chronic systolic (congestive) heart failure: Secondary | ICD-10-CM | POA: Diagnosis not present

## 2014-12-26 DIAGNOSIS — I160 Hypertensive urgency: Secondary | ICD-10-CM

## 2014-12-26 DIAGNOSIS — Z9119 Patient's noncompliance with other medical treatment and regimen: Secondary | ICD-10-CM | POA: Diagnosis not present

## 2014-12-26 DIAGNOSIS — E785 Hyperlipidemia, unspecified: Secondary | ICD-10-CM | POA: Diagnosis not present

## 2014-12-26 DIAGNOSIS — I25119 Atherosclerotic heart disease of native coronary artery with unspecified angina pectoris: Secondary | ICD-10-CM

## 2014-12-26 DIAGNOSIS — I200 Unstable angina: Secondary | ICD-10-CM

## 2014-12-26 DIAGNOSIS — R61 Generalized hyperhidrosis: Secondary | ICD-10-CM | POA: Insufficient documentation

## 2014-12-26 DIAGNOSIS — I2510 Atherosclerotic heart disease of native coronary artery without angina pectoris: Secondary | ICD-10-CM | POA: Insufficient documentation

## 2014-12-26 DIAGNOSIS — N183 Chronic kidney disease, stage 3 (moderate): Secondary | ICD-10-CM | POA: Diagnosis not present

## 2014-12-26 DIAGNOSIS — R51 Headache: Secondary | ICD-10-CM

## 2014-12-26 DIAGNOSIS — Z9861 Coronary angioplasty status: Secondary | ICD-10-CM | POA: Diagnosis not present

## 2014-12-26 DIAGNOSIS — I10 Essential (primary) hypertension: Secondary | ICD-10-CM | POA: Diagnosis present

## 2014-12-26 DIAGNOSIS — F17210 Nicotine dependence, cigarettes, uncomplicated: Secondary | ICD-10-CM | POA: Insufficient documentation

## 2014-12-26 DIAGNOSIS — I2583 Coronary atherosclerosis due to lipid rich plaque: Secondary | ICD-10-CM

## 2014-12-26 DIAGNOSIS — Z91199 Patient's noncompliance with other medical treatment and regimen due to unspecified reason: Secondary | ICD-10-CM

## 2014-12-26 DIAGNOSIS — I252 Old myocardial infarction: Secondary | ICD-10-CM | POA: Insufficient documentation

## 2014-12-26 DIAGNOSIS — R0602 Shortness of breath: Secondary | ICD-10-CM | POA: Diagnosis not present

## 2014-12-26 LAB — BASIC METABOLIC PANEL
ANION GAP: 11 (ref 5–15)
BUN: 31 mg/dL — ABNORMAL HIGH (ref 6–20)
CO2: 23 mmol/L (ref 22–32)
CREATININE: 1.6 mg/dL — AB (ref 0.61–1.24)
Calcium: 9.7 mg/dL (ref 8.9–10.3)
Chloride: 100 mmol/L — ABNORMAL LOW (ref 101–111)
GFR calc Af Amer: 59 mL/min — ABNORMAL LOW (ref >60)
GFR, EST NON AFRICAN AMERICAN: 51 mL/min — AB (ref >60)
Glucose, Bld: 106 mg/dL — ABNORMAL HIGH (ref 65–99)
Potassium: 3.6 mmol/L (ref 3.5–5.1)
Sodium: 134 mmol/L — ABNORMAL LOW (ref 135–145)

## 2014-12-26 LAB — CBC
HCT: 49.5 % (ref 39.0–52.0)
HEMOGLOBIN: 17.3 g/dL — AB (ref 13.0–17.0)
MCH: 30.7 pg (ref 26.0–34.0)
MCHC: 34.9 g/dL (ref 30.0–36.0)
MCV: 87.9 fL (ref 78.0–100.0)
Platelets: 261 10*3/uL (ref 150–400)
RBC: 5.63 MIL/uL (ref 4.22–5.81)
RDW: 14.6 % (ref 11.5–15.5)
WBC: 7.2 10*3/uL (ref 4.0–10.5)

## 2014-12-26 LAB — HEPARIN LEVEL (UNFRACTIONATED): Heparin Unfractionated: 0.4 IU/mL (ref 0.30–0.70)

## 2014-12-26 LAB — RAPID URINE DRUG SCREEN, HOSP PERFORMED
AMPHETAMINES: NOT DETECTED
BARBITURATES: NOT DETECTED
Benzodiazepines: NOT DETECTED
Cocaine: NOT DETECTED
Opiates: NOT DETECTED
TETRAHYDROCANNABINOL: POSITIVE — AB

## 2014-12-26 LAB — APTT: aPTT: 35 seconds (ref 24–37)

## 2014-12-26 LAB — I-STAT TROPONIN, ED
TROPONIN I, POC: 0.03 ng/mL (ref 0.00–0.08)
Troponin i, poc: 0.03 ng/mL (ref 0.00–0.08)

## 2014-12-26 LAB — PROTIME-INR
INR: 1.04 (ref 0.00–1.49)
Prothrombin Time: 13.8 seconds (ref 11.6–15.2)

## 2014-12-26 LAB — TROPONIN I: TROPONIN I: 0.09 ng/mL — AB (ref <0.031)

## 2014-12-26 MED ORDER — MORPHINE SULFATE (PF) 4 MG/ML IV SOLN
4.0000 mg | Freq: Once | INTRAVENOUS | Status: DC
  Filled 2014-12-26: qty 1

## 2014-12-26 MED ORDER — HYDRALAZINE HCL 50 MG PO TABS
50.0000 mg | ORAL_TABLET | Freq: Three times a day (TID) | ORAL | Status: DC
  Filled 2014-12-26 (×2): qty 1

## 2014-12-26 MED ORDER — HEPARIN BOLUS VIA INFUSION
4000.0000 [IU] | INTRAVENOUS | Status: AC
  Administered 2014-12-26: 4000 [IU] via INTRAVENOUS
  Filled 2014-12-26: qty 4000

## 2014-12-26 MED ORDER — HEPARIN (PORCINE) IN NACL 100-0.45 UNIT/ML-% IJ SOLN
1200.0000 [IU]/h | INTRAMUSCULAR | Status: DC
  Administered 2014-12-26 – 2014-12-28 (×3): 1200 [IU]/h via INTRAVENOUS
  Filled 2014-12-26 (×5): qty 250

## 2014-12-26 MED ORDER — HYDRALAZINE HCL 25 MG PO TABS
25.0000 mg | ORAL_TABLET | Freq: Once | ORAL | Status: AC
  Administered 2014-12-26: 25 mg via ORAL
  Filled 2014-12-26: qty 1

## 2014-12-26 MED ORDER — METOPROLOL TARTRATE 25 MG PO TABS
25.0000 mg | ORAL_TABLET | Freq: Once | ORAL | Status: AC
  Administered 2014-12-26: 25 mg via ORAL
  Filled 2014-12-26: qty 1

## 2014-12-26 MED ORDER — FENTANYL CITRATE (PF) 100 MCG/2ML IJ SOLN
50.0000 ug | Freq: Once | INTRAMUSCULAR | Status: AC
  Administered 2014-12-26: 50 ug via INTRAVENOUS
  Filled 2014-12-26: qty 2

## 2014-12-26 MED ORDER — LORAZEPAM 0.5 MG PO TABS
0.5000 mg | ORAL_TABLET | Freq: Four times a day (QID) | ORAL | Status: DC | PRN
  Administered 2014-12-26 – 2014-12-29 (×3): 0.5 mg via ORAL
  Filled 2014-12-26 (×3): qty 1

## 2014-12-26 MED ORDER — HYDRALAZINE HCL 20 MG/ML IJ SOLN
10.0000 mg | Freq: Once | INTRAMUSCULAR | Status: AC
  Administered 2014-12-26: 10 mg via INTRAVENOUS
  Filled 2014-12-26: qty 1

## 2014-12-26 MED ORDER — HYDRALAZINE HCL 50 MG PO TABS
50.0000 mg | ORAL_TABLET | Freq: Three times a day (TID) | ORAL | Status: DC
  Filled 2014-12-26: qty 1

## 2014-12-26 MED ORDER — ASPIRIN 81 MG PO CHEW
324.0000 mg | CHEWABLE_TABLET | Freq: Once | ORAL | Status: AC
  Administered 2014-12-26: 324 mg via ORAL
  Filled 2014-12-26: qty 4

## 2014-12-26 MED ORDER — NITROGLYCERIN 0.4 MG SL SUBL
0.4000 mg | SUBLINGUAL_TABLET | SUBLINGUAL | Status: DC | PRN
Start: 2014-12-26 — End: 2014-12-29
  Administered 2014-12-26 (×2): 0.4 mg via SUBLINGUAL
  Filled 2014-12-26: qty 1

## 2014-12-26 MED ORDER — ACETAMINOPHEN 325 MG PO TABS
650.0000 mg | ORAL_TABLET | ORAL | Status: DC | PRN
  Administered 2014-12-26 – 2014-12-28 (×2): 650 mg via ORAL
  Filled 2014-12-26 (×2): qty 2

## 2014-12-26 MED ORDER — CLONIDINE HCL 0.1 MG PO TABS
0.1000 mg | ORAL_TABLET | Freq: Every day | ORAL | Status: DC
  Administered 2014-12-26 – 2014-12-27 (×2): 0.1 mg via ORAL
  Filled 2014-12-26 (×2): qty 1

## 2014-12-26 MED ORDER — NITROGLYCERIN 2 % TD OINT
1.0000 [in_us] | TOPICAL_OINTMENT | Freq: Once | TRANSDERMAL | Status: AC
  Administered 2014-12-26: 1 [in_us] via TOPICAL
  Filled 2014-12-26: qty 30

## 2014-12-26 MED ORDER — MORPHINE SULFATE (PF) 2 MG/ML IV SOLN
2.0000 mg | INTRAVENOUS | Status: DC | PRN
  Administered 2014-12-26 – 2014-12-29 (×16): 2 mg via INTRAVENOUS
  Filled 2014-12-26 (×16): qty 1

## 2014-12-26 MED ORDER — CARVEDILOL 25 MG PO TABS: 25.0000 mg | ORAL_TABLET | Freq: Two times a day (BID) | ORAL | Status: DC

## 2014-12-26 MED ORDER — ONDANSETRON HCL 4 MG/2ML IJ SOLN
4.0000 mg | Freq: Four times a day (QID) | INTRAMUSCULAR | Status: DC | PRN
  Administered 2014-12-28: 4 mg via INTRAVENOUS
  Filled 2014-12-26: qty 2

## 2014-12-26 MED ORDER — CARVEDILOL 12.5 MG PO TABS
25.0000 mg | ORAL_TABLET | Freq: Two times a day (BID) | ORAL | Status: DC
Start: 2014-12-26 — End: 2014-12-29
  Administered 2014-12-26 – 2014-12-28 (×5): 25 mg via ORAL
  Administered 2014-12-29: 12.5 mg via ORAL
  Filled 2014-12-26 (×5): qty 2
  Filled 2014-12-26 (×2): qty 1
  Filled 2014-12-26: qty 2
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
## 2014-12-26 NOTE — ED Notes (Signed)
Delay in lab draw pt en route to [place] 

## 2014-12-26 NOTE — ED Notes (Signed)
Pt refused last nitro stating it gives him a headache

## 2014-12-26 NOTE — ED Notes (Signed)
Pt is aware of need urine specimen however could not provide one at that time.
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
## 2014-12-27 ENCOUNTER — Observation Stay (HOSPITAL_BASED_OUTPATIENT_CLINIC_OR_DEPARTMENT_OTHER)

## 2014-12-27 ENCOUNTER — Encounter (HOSPITAL_COMMUNITY): Payer: Self-pay | Admitting: *Deleted

## 2014-12-27 DIAGNOSIS — I214 Non-ST elevation (NSTEMI) myocardial infarction: Secondary | ICD-10-CM

## 2014-12-27 DIAGNOSIS — I10 Essential (primary) hypertension: Secondary | ICD-10-CM

## 2014-12-27 DIAGNOSIS — E785 Hyperlipidemia, unspecified: Secondary | ICD-10-CM

## 2014-12-27 DIAGNOSIS — Z91199 Patient's noncompliance with other medical treatment and regimen due to unspecified reason: Secondary | ICD-10-CM

## 2014-12-27 DIAGNOSIS — Z9119 Patient's noncompliance with other medical treatment and regimen: Secondary | ICD-10-CM

## 2014-12-27 DIAGNOSIS — I209 Angina pectoris, unspecified: Secondary | ICD-10-CM

## 2014-12-27 DIAGNOSIS — R0789 Other chest pain: Secondary | ICD-10-CM

## 2014-12-27 DIAGNOSIS — N179 Acute kidney failure, unspecified: Secondary | ICD-10-CM

## 2014-12-27 DIAGNOSIS — N183 Chronic kidney disease, stage 3 (moderate): Secondary | ICD-10-CM

## 2014-12-27 DIAGNOSIS — R079 Chest pain, unspecified: Secondary | ICD-10-CM

## 2014-12-27 LAB — CBC WITH DIFFERENTIAL/PLATELET
BASOS ABS: 0 10*3/uL (ref 0.0–0.1)
Basophils Relative: 1 % (ref 0–1)
EOS ABS: 0.1 10*3/uL (ref 0.0–0.7)
EOS PCT: 2 % (ref 0–5)
HCT: 45.9 % (ref 39.0–52.0)
Hemoglobin: 15.6 g/dL (ref 13.0–17.0)
Lymphocytes Relative: 46 % (ref 12–46)
Lymphs Abs: 2.8 10*3/uL (ref 0.7–4.0)
MCH: 30 pg (ref 26.0–34.0)
MCHC: 34 g/dL (ref 30.0–36.0)
MCV: 88.3 fL (ref 78.0–100.0)
MONO ABS: 0.6 10*3/uL (ref 0.1–1.0)
Monocytes Relative: 10 % (ref 3–12)
Neutro Abs: 2.4 10*3/uL (ref 1.7–7.7)
Neutrophils Relative %: 41 % — ABNORMAL LOW (ref 43–77)
PLATELETS: 225 10*3/uL (ref 150–400)
RBC: 5.2 MIL/uL (ref 4.22–5.81)
RDW: 14.5 % (ref 11.5–15.5)
WBC: 5.9 10*3/uL (ref 4.0–10.5)

## 2014-12-27 LAB — COMPREHENSIVE METABOLIC PANEL
ALT: 31 U/L (ref 17–63)
AST: 39 U/L (ref 15–41)
Albumin: 3.7 g/dL (ref 3.5–5.0)
Alkaline Phosphatase: 75 U/L (ref 38–126)
Anion gap: 7 (ref 5–15)
BUN: 26 mg/dL — ABNORMAL HIGH (ref 6–20)
CHLORIDE: 103 mmol/L (ref 101–111)
CO2: 26 mmol/L (ref 22–32)
CREATININE: 1.42 mg/dL — AB (ref 0.61–1.24)
Calcium: 8.8 mg/dL — ABNORMAL LOW (ref 8.9–10.3)
GFR, EST NON AFRICAN AMERICAN: 59 mL/min — AB (ref >60)
Glucose, Bld: 97 mg/dL (ref 65–99)
POTASSIUM: 3.2 mmol/L — AB (ref 3.5–5.1)
SODIUM: 136 mmol/L (ref 135–145)
Total Bilirubin: 1 mg/dL (ref 0.3–1.2)
Total Protein: 8 g/dL (ref 6.5–8.1)

## 2014-12-27 LAB — TROPONIN I
TROPONIN I: 0.08 ng/mL — AB (ref <0.031)
TROPONIN I: 0.09 ng/mL — AB (ref <0.031)
TROPONIN I: 0.09 ng/mL — AB (ref <0.031)
Troponin I: 0.09 ng/mL — ABNORMAL HIGH (ref <0.031)

## 2014-12-27 LAB — PROTIME-INR
INR: 1.11 (ref 0.00–1.49)
PROTHROMBIN TIME: 14.5 s (ref 11.6–15.2)

## 2014-12-27 LAB — HEPARIN LEVEL (UNFRACTIONATED): HEPARIN UNFRACTIONATED: 0.38 [IU]/mL (ref 0.30–0.70)

## 2014-12-27 LAB — MRSA PCR SCREENING: MRSA BY PCR: POSITIVE — AB

## 2014-12-27 MED ORDER — MUPIROCIN 2 % EX OINT
1.0000 "application " | TOPICAL_OINTMENT | Freq: Two times a day (BID) | CUTANEOUS | Status: DC
  Administered 2014-12-27 – 2014-12-29 (×5): 1 via NASAL
  Filled 2014-12-27: qty 22

## 2014-12-27 MED ORDER — ATORVASTATIN CALCIUM 40 MG PO TABS
80.0000 mg | ORAL_TABLET | Freq: Every day | ORAL | Status: DC
  Administered 2014-12-27 – 2014-12-28 (×3): 80 mg via ORAL
  Filled 2014-12-27 (×2): qty 2
  Filled 2014-12-27: qty 8
  Filled 2014-12-27: qty 2

## 2014-12-27 MED ORDER — SODIUM CHLORIDE 0.9 % IV SOLN
INTRAVENOUS | Status: DC
  Administered 2014-12-27 – 2014-12-29 (×3): via INTRAVENOUS

## 2014-12-27 MED ORDER — HYDRALAZINE HCL 50 MG PO TABS
50.0000 mg | ORAL_TABLET | Freq: Four times a day (QID) | ORAL | Status: DC
  Administered 2014-12-27 – 2014-12-29 (×8): 50 mg via ORAL
  Filled 2014-12-27 (×8): qty 1

## 2014-12-27 MED ORDER — ISOSORBIDE MONONITRATE ER 60 MG PO TB24
30.0000 mg | ORAL_TABLET | Freq: Every day | ORAL | Status: DC
  Administered 2014-12-27 – 2014-12-29 (×3): 30 mg via ORAL
  Filled 2014-12-27 (×3): qty 1

## 2014-12-27 MED ORDER — ASPIRIN EC 325 MG PO TBEC
325.0000 mg | DELAYED_RELEASE_TABLET | Freq: Every day | ORAL | Status: DC
  Administered 2014-12-27 – 2014-12-29 (×3): 325 mg via ORAL
  Filled 2014-12-27 (×3): qty 1

## 2014-12-27 MED ORDER — AMLODIPINE BESYLATE 10 MG PO TABS
10.0000 mg | ORAL_TABLET | Freq: Every day | ORAL | Status: DC
  Administered 2014-12-28 – 2014-12-29 (×2): 10 mg via ORAL
  Filled 2014-12-27 (×2): qty 1

## 2014-12-27 MED ORDER — AMLODIPINE BESYLATE 5 MG PO TABS
5.0000 mg | ORAL_TABLET | Freq: Every day | ORAL | Status: DC
  Administered 2014-12-27: 5 mg via ORAL
  Filled 2014-12-27: qty 1

## 2014-12-27 MED ORDER — POTASSIUM CHLORIDE CRYS ER 20 MEQ PO TBCR
40.0000 meq | EXTENDED_RELEASE_TABLET | Freq: Once | ORAL | Status: AC
  Administered 2014-12-27: 40 meq via ORAL
  Filled 2014-12-27: qty 2

## 2014-12-27 MED ORDER — HYDRALAZINE HCL 20 MG/ML IJ SOLN
10.0000 mg | INTRAMUSCULAR | Status: DC | PRN
  Administered 2014-12-27 – 2014-12-28 (×3): 10 mg via INTRAVENOUS
  Filled 2014-12-27 (×3): qty 1

## 2014-12-27 MED ORDER — CHLORHEXIDINE GLUCONATE CLOTH 2 % EX PADS
6.0000 | MEDICATED_PAD | Freq: Every day | CUTANEOUS | Status: DC
  Administered 2014-12-27 – 2014-12-29 (×2): 6 via TOPICAL

## 2014-12-27 MED ORDER — HYDRALAZINE HCL 20 MG/ML IJ SOLN: 10.0000 mg | Freq: Four times a day (QID) | INTRAMUSCULAR | Status: DC | PRN
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
## 2014-12-27 NOTE — Progress Notes (Signed)
  Echocardiogram 2D Echocardiogram has been performed.  Lysle Rubens 12/27/2014, 8:51 AM
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
## 2014-12-27 NOTE — Progress Notes (Signed)
ANTICOAGULATION CONSULT NOTE - Follow Up Consult  Pharmacy Consult for Heparin Indication: chest pain/ACS  Allergies  Allergen Reactions  . Lisinopril Anaphylaxis    Whole right side of face swelled.     Patient Measurements: Height: 5' 8.75" (174.6 cm) Weight: 248 lb 3.8 oz (112.6 kg) IBW/kg (Calculated) : 70.13 Heparin Dosing Weight:   Vital Signs: BP: 181/117 mmHg (08/21 0200) Pulse Rate: 60 (08/21 0200)  Labs:  Recent Labs  12/26/14 1521 12/26/14 1648 12/26/14 2153 12/26/14 2154 12/27/14 0025  HGB 17.3*  --   --   --   --   HCT 49.5  --   --   --   --   PLT 261  --   --   --   --   APTT  --  35  --   --   --   LABPROT  --  13.8  --   --   --   INR  --  1.04  --   --   --   HEPARINUNFRC  --   --   --  0.40  --   CREATININE 1.60*  --   --   --   --   TROPONINI  --   --  0.09*  --  0.09*    Estimated Creatinine Clearance: 72.6 mL/min (by C-G formula based on Cr of 1.6).   Medications:  Infusions:  . heparin 1,200 Units/hr (12/26/14 1714)    Assessment: Patient with heparin level at goal.  No heparin issues noted.  Goal of Therapy:  Heparin level 0.3-0.7 units/ml Monitor platelets by anticoagulation protocol: Yes   Plan:  Continue heparin drip at current rate Recheck level with AM labs  Tyler Deis, Shea Stakes Crowford 12/27/2014,2:45 AM

## 2014-12-27 NOTE — Progress Notes (Signed)
CRITICAL VALUE ALERT  Critical value received:  Positive MRSA by PCR   Date of notification:  12/27/2014  Time of notification:  0202  Critical value read back:Yes.    Nurse who received alert:  Reche Dixon  MD notified (1st page):  L Easterwood  Time of first page:  0215  MD notified (2nd page):  Time of second page:  Responding MD:  Vivien Presto   Time MD responded:  [id number]
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
## 2014-12-27 NOTE — Progress Notes (Signed)
[facility] PROGRESS NOTE  Scott Salinas [id number] DOB: 16-Feb-1971 DOA: 12/26/2014 PCP: PROVIDER NOT IN SYSTEM  Assessment/Plan: 1. Chest pain, concerns for unstable angina 1. Presented with sx of L sided chest pain with sob, diaphoresis, nausea and dizziness 2. Pt has been continued on heparin gtt 3. 2d echo with findings of normal LVEF and hypokinesis with LVH suggestive of grade 2 diastolic dysfunction 4. Patient continues with L sided chest pain improved with analgesics 5. Prior records reviewed. Pt was previously admitted in 2009 for chest pain, found to have reversible ischemia on stress test but left against medical advice before cath could be performed. Pt had been lost to follow up since 6. Have discussed case with Cardiology who will see in consultation 7. Trop remains stable at 0.09. Will follow additional trop 2. Accelerated HTN 1. Poorly controlled 2. Question compliance of medications 3. Cont home meds, cont with PRN hydralazine 4. Titrate BP meds as needed 3. Headache 1. Cont analgesics as tolerated 4. Anxiety 1. Seems stable presently 5. Hx noncompliance 1. Per above, historically has demonstrated noncompliance with cardiac recommendations 6. Hx polysubstance abuse 1. Drug screen pos for marijuana 7. DVT prophylaxis 1. On heparin gtt currently  Code Status: Full Family Communication: Pt in room (indicate person spoken with, relationship, and if by phone, the number) Disposition Plan: Pending   Consultants:  [facility]  Procedures:    Antibiotics:   (indicate start date, and stop date if known)  HPI/Subjective: Continues to complain of L sided chest pain  Objective: Filed Vitals:   12/27/14 1100 12/27/14 1200 12/27/14 1221 12/27/14 1300  BP:  125/93 145/107   Pulse: 64 69  69  Temp:      TempSrc:      Resp: 22 19  21   Height:      Weight:      SpO2: 96% 96%  97%    Intake/Output Summary (Last 24 hours) at 12/27/14  1437 Last data filed at 12/27/14 1300  Gross per 24 hour  Intake  237.2 ml  Output   1125 ml  Net -887.8 ml   Filed Weights   12/26/14 1650 12/26/14 2145  Weight: 112.492 kg (248 lb) 112.6 kg (248 lb 3.8 oz)    Exam:   General:  Awake, in nad  Cardiovascular: regular, s1, s2  Respiratory: normal resp effort, no wheezing  Abdomen: soft,nondistended  Musculoskeletal: perfused, no clubbing   Data Reviewed: Basic Metabolic Panel:  Recent Labs Lab 12/26/14 1521 12/27/14 0410  NA 134* 136  K 3.6 3.2*  CL 100* 103  CO2 23 26  GLUCOSE 106* 97  BUN 31* 26*  CREATININE 1.60* 1.42*  CALCIUM 9.7 8.8*   Liver Function Tests:  Recent Labs Lab 12/27/14 0410  AST 39  ALT 31  ALKPHOS 75  BILITOT 1.0  PROT 8.0  ALBUMIN 3.7   No results for input(s): LIPASE, AMYLASE in the last 168 hours. No results for input(s): AMMONIA in the last 168 hours. CBC:  Recent Labs Lab 12/26/14 1521 12/27/14 0410  WBC 7.2 5.9  NEUTROABS  --  2.4  HGB 17.3* 15.6  HCT 49.5 45.9  MCV 87.9 88.3  PLT 261 225   Cardiac Enzymes:  Recent Labs Lab 12/26/14 2153 12/27/14 0025 12/27/14 0410 12/27/14 1403  TROPONINI 0.09* 0.09* 0.09* 0.09*   BNP (last 3 results) No results for input(s): BNP in the last 8760 hours.  ProBNP (last 3 results) No results for input(s): PROBNP in the last 8760  hours.  CBG: No results for input(s): GLUCAP in the last 168 hours.  Recent Results (from the past 240 hour(s))  MRSA PCR Screening     Status: Abnormal   Collection Time: 12/26/14 10:50 PM  Result Value Ref Range Status   MRSA by PCR POSITIVE (A) NEGATIVE Final    Comment:        The GeneXpert MRSA Assay (FDA approved for NASAL specimens only), is one component of a comprehensive MRSA colonization surveillance program. It is not intended to diagnose MRSA infection nor to guide or monitor treatment for MRSA infections. RESULT CALLED TO, READ BACK BY AND VERIFIED WITH: KROLICZAK,A/2W  @0202  ON 12/27/14 BY KARCZEWSKI,S.      Studies: Dg Chest 2 View  12/26/2014   CLINICAL DATA:  Sharp left-sided chest pain and shortness of Breath  EXAM: CHEST - 2 VIEW  COMPARISON:  02/02/2013  FINDINGS: The heart size and mediastinal contours are within normal limits. Both lungs are clear. The visualized skeletal structures are unremarkable.  IMPRESSION: No active disease.   Electronically Signed   By: Inez Catalina M.D.   On: 12/26/2014 15:14    Scheduled Meds: . amLODipine  5 mg Oral Daily  . aspirin EC  325 mg Oral Daily  . atorvastatin  80 mg Oral q1800  . carvedilol  25 mg Oral BID WC  . Chlorhexidine Gluconate Cloth  6 each Topical Q0600  . cloNIDine  0.1 mg Oral Daily  . hydrALAZINE  50 mg Oral 4 times per day  . mupirocin ointment  1 application Nasal BID   Continuous Infusions: . heparin 1,200 Units/hr (12/27/14 1105)    Principal Problem:   Chest pain Active Problems:   Accelerated hypertension   Headache   Scott Salinas K  [facility] Pager [phone]. If 7PM-7AM, please contact night-coverage at [email or web address], password [facility] 12/27/2014, 2:37 PM

## 2014-12-28 ENCOUNTER — Ambulatory Visit (HOSPITAL_COMMUNITY)
Admit: 2014-12-28 | Discharge: 2014-12-28 | Disposition: A | Discharge status: Home / Self Care | Attending: Physician Assistant | Admitting: Physician Assistant

## 2014-12-28 DIAGNOSIS — I200 Unstable angina: Secondary | ICD-10-CM

## 2014-12-28 DIAGNOSIS — R079 Chest pain, unspecified: Secondary | ICD-10-CM | POA: Diagnosis not present

## 2014-12-28 DIAGNOSIS — N189 Chronic kidney disease, unspecified: Secondary | ICD-10-CM

## 2014-12-28 DIAGNOSIS — I25119 Atherosclerotic heart disease of native coronary artery with unspecified angina pectoris: Secondary | ICD-10-CM

## 2014-12-28 DIAGNOSIS — I5022 Chronic systolic (congestive) heart failure: Secondary | ICD-10-CM

## 2014-12-28 DIAGNOSIS — R072 Precordial pain: Secondary | ICD-10-CM

## 2014-12-28 DIAGNOSIS — R7989 Other specified abnormal findings of blood chemistry: Secondary | ICD-10-CM

## 2014-12-28 LAB — NM MYOCAR MULTI W/SPECT W/WALL MOTION / EF
CHL CUP MPHR: 176 {beats}/min
CHL CUP NUCLEAR SDS: 0
CHL CUP NUCLEAR SRS: 8
CHL CUP RESTING HR STRESS: 62 {beats}/min
CHL RATE OF PERCEIVED EXERTION: 0
CSEPED: 0 min
CSEPEDS: 0 s
CSEPEW: 1 METS
LV sys vol: 154 mL
LVDIAVOL: 249 mL
Peak HR: 99 {beats}/min
Percent HR: 0 %
RATE: 0
SSS: 8
TID: 1.16

## 2014-12-28 LAB — CBC
HEMATOCRIT: 45.1 % (ref 39.0–52.0)
Hemoglobin: 15.2 g/dL (ref 13.0–17.0)
MCH: 29.8 pg (ref 26.0–34.0)
MCHC: 33.7 g/dL (ref 30.0–36.0)
MCV: 88.4 fL (ref 78.0–100.0)
PLATELETS: 240 10*3/uL (ref 150–400)
RBC: 5.1 MIL/uL (ref 4.22–5.81)
RDW: 14.4 % (ref 11.5–15.5)
WBC: 5.7 10*3/uL (ref 4.0–10.5)

## 2014-12-28 LAB — BASIC METABOLIC PANEL
Anion gap: 8 (ref 5–15)
BUN: 22 mg/dL — AB (ref 6–20)
CALCIUM: 8.9 mg/dL (ref 8.9–10.3)
CO2: 24 mmol/L (ref 22–32)
CREATININE: 1.49 mg/dL — AB (ref 0.61–1.24)
Chloride: 103 mmol/L (ref 101–111)
GFR, EST NON AFRICAN AMERICAN: 55 mL/min — AB (ref >60)
Glucose, Bld: 101 mg/dL — ABNORMAL HIGH (ref 65–99)
Potassium: 3.6 mmol/L (ref 3.5–5.1)
SODIUM: 135 mmol/L (ref 135–145)

## 2014-12-28 LAB — HEPARIN LEVEL (UNFRACTIONATED): HEPARIN UNFRACTIONATED: 0.43 [IU]/mL (ref 0.30–0.70)

## 2014-12-28 LAB — TROPONIN I: TROPONIN I: 0.08 ng/mL — AB (ref <0.031)

## 2014-12-28 MED ORDER — REGADENOSON 0.4 MG/5ML IV SOLN
0.4000 mg | Freq: Once | INTRAVENOUS | Status: AC
  Administered 2014-12-28: 0.4 mg via INTRAVENOUS
  Filled 2014-12-28: qty 5

## 2014-12-28 MED ORDER — TECHNETIUM TC 99M SESTAMIBI GENERIC - CARDIOLITE
30.0000 | Freq: Once | INTRAVENOUS | Status: AC | PRN
  Administered 2014-12-28: 30 via INTRAVENOUS

## 2014-12-28 MED ORDER — COLCHICINE 0.6 MG PO TABS
0.6000 mg | ORAL_TABLET | Freq: Two times a day (BID) | ORAL | Status: DC
  Administered 2014-12-28 – 2014-12-29 (×2): 0.6 mg via ORAL
  Filled 2014-12-28 (×3): qty 1

## 2014-12-28 MED ORDER — TECHNETIUM TC 99M SESTAMIBI GENERIC - CARDIOLITE
10.0000 | Freq: Once | INTRAVENOUS | Status: AC | PRN
  Administered 2014-12-28: 10 via INTRAVENOUS

## 2014-12-28 MED ORDER — SODIUM CHLORIDE 0.9 % IJ SOLN
80.0000 mg | INTRAVENOUS | Status: AC
  Administered 2014-12-28: 80 mg via INTRAVENOUS
  Filled 2014-12-28: qty 3.2

## 2014-12-28 MED ORDER — CLONIDINE HCL 0.1 MG PO TABS
0.2000 mg | ORAL_TABLET | Freq: Three times a day (TID) | ORAL | Status: DC
  Administered 2014-12-28 (×3): 0.2 mg via ORAL
  Filled 2014-12-28 (×3): qty 2

## 2014-12-28 MED ORDER — REGADENOSON 0.4 MG/5ML IV SOLN
0.4000 mg | Freq: Once | INTRAVENOUS | Status: AC
  Filled 2014-12-28: qty 5

## 2014-12-28 NOTE — Progress Notes (Signed)
[facility] PROGRESS NOTE  Scott Salinas Orders [id number] DOB: March 14, 1971 DOA: 12/26/2014 PCP: PROVIDER NOT IN SYSTEM  Assessment/Plan: 1. Chest pain, concerns for unstable angina 1. Presented with sx of L sided chest pain with sob, diaphoresis, nausea and dizziness 2. Pt has been continued on heparin gtt 3. 2d echo with findings of normal LVEF and hypokinesis with LVH suggestive of grade 2 diastolic dysfunction 4. Patient continues with L sided chest pain improved with analgesics 5. Prior records reviewed. Pt was previously admitted in 2009 for chest pain, found to have reversible ischemia on stress test but left against medical advice before cath could be performed. Pt had been lost to follow up since 6. Trop has remained stable at around 0.09. 7. Pt underwent stress testing on 8/22 that was found to be an intermediate risk study 2. Accelerated HTN 1. Question compliance of medications 2. Initially cont home meds with PRN hydralazine 3. Amlodipine increased to 10mg , imdur added, and  Increased clonidine from 0.1mg  qday to 0.2mg  TID 4. BP much improved 3. Headache 1. Cont analgesics as tolerated 4. Anxiety 1. Seems stable presently 5. Hx noncompliance 1. Per above, historically has demonstrated noncompliance with cardiac recommendations 6. Hx polysubstance abuse 1. Drug screen pos for marijuana 7. DVT prophylaxis 1. On heparin gtt currently  Code Status: Full Family Communication: Pt in room  Disposition Plan: Pending   Consultants:  [facility]ilogy  Procedures:    Antibiotics:   (indicate start date, and stop date if known)  HPI/Subjective: Still complains of L sided chest pains  Objective: Filed Vitals:   12/28/14 1300 12/28/14 1309 12/28/14 1600 12/28/14 1659  BP: 167/97 167/97  135/77  Pulse: 68     Temp:   97.7 F (36.5 C)   TempSrc:   Oral   Resp: 20     Height:      Weight:      SpO2: 99%       Intake/Output Summary (Last 24 hours) at  12/28/14 1748 Last data filed at 12/28/14 0930  Gross per 24 hour  Intake   1533 ml  Output   2650 ml  Net  -1117 ml   Filed Weights   12/26/14 1650 12/26/14 2145  Weight: 112.492 kg (248 lb) 112.6 kg (248 lb 3.8 oz)    Exam:   General:  Awake, laying in bed, in nad  Cardiovascular: regular, s1, s2  Respiratory: normal resp effort, no wheezing  Abdomen: soft,nondistended  Musculoskeletal: perfused, no clubbing, no cyanosis  Data Reviewed: Basic Metabolic Panel:  Recent Labs Lab 12/26/14 1521 12/27/14 0410 12/28/14 0116  NA 134* 136 135  K 3.6 3.2* 3.6  CL 100* 103 103  CO2 23 26 24   GLUCOSE 106* 97 101*  BUN 31* 26* 22*  CREATININE 1.60* 1.42* 1.49*  CALCIUM 9.7 8.8* 8.9   Liver Function Tests:  Recent Labs Lab 12/27/14 0410  AST 39  ALT 31  ALKPHOS 75  BILITOT 1.0  PROT 8.0  ALBUMIN 3.7   No results for input(s): LIPASE, AMYLASE in the last 168 hours. No results for input(s): AMMONIA in the last 168 hours. CBC:  Recent Labs Lab 12/26/14 1521 12/27/14 0410 12/28/14 0116  WBC 7.2 5.9 5.7  NEUTROABS  --  2.4  --   HGB 17.3* 15.6 15.2  HCT 49.5 45.9 45.1  MCV 87.9 88.3 88.4  PLT 261 225 240   Cardiac Enzymes:  Recent Labs Lab 12/27/14 0025 12/27/14 0410 12/27/14 1403 12/27/14 1932 12/28/14 0116  TROPONINI 0.09* 0.09* 0.09* 0.08* 0.08*   BNP (last 3 results) No results for input(s): BNP in the last 8760 hours.  ProBNP (last 3 results) No results for input(s): PROBNP in the last 8760 hours.  CBG: No results for input(s): GLUCAP in the last 168 hours.  Recent Results (from the past 240 hour(s))  MRSA PCR Screening     Status: Abnormal   Collection Time: 12/26/14 10:50 PM  Result Value Ref Range Status   MRSA by PCR POSITIVE (A) NEGATIVE Final    Comment:        The GeneXpert MRSA Assay (FDA approved for NASAL specimens only), is one component of a comprehensive MRSA colonization surveillance program. It is not intended to  diagnose MRSA infection nor to guide or monitor treatment for MRSA infections. RESULT CALLED TO, READ BACK BY AND VERIFIED WITH: KROLICZAK,A/2W @0202  ON 12/27/14 BY KARCZEWSKI,S.      Studies: Nm Myocar Multi W/spect W/wall Motion / Ef  12/28/2014    Downsloping ST segment depression ST segment depression was noted during  stress in the II, III, V4, V5, V6 and aVF leads, beginning at 0 minutes of  stress.  T wave inversion was noted during stress, beginning at 0 minutes of  stress. T wave inversion persisted.  Defect 1: There is a medium defect of mild severity present in the basal  inferolateral and mid inferolateral location.  The left ventricular ejection fraction is moderately decreased (30-44%).  Findings consistent with prior myocardial infarction.  This is an intermediate risk study.     Scheduled Meds: . amLODipine  10 mg Oral Daily  . aspirin EC  325 mg Oral Daily  . atorvastatin  80 mg Oral q1800  . carvedilol  25 mg Oral BID WC  . Chlorhexidine Gluconate Cloth  6 each Topical Q0600  . cloNIDine  0.2 mg Oral TID  . hydrALAZINE  50 mg Oral 4 times per day  . isosorbide mononitrate  30 mg Oral Daily  . mupirocin ointment  1 application Nasal BID   Continuous Infusions: . sodium chloride 75 mL/hr at 12/28/14 0600  . heparin 1,200 Units/hr (12/28/14 [id number])    Principal Problem:   Chest pain Active Problems:   Accelerated hypertension   Headache   History of noncompliance with medical treatment   [facility], [facility] Pager [phone]. If 7PM-7AM, please contact night-coverage at [email or web address], password [facility] 12/28/2014, 5:48 PM
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
## 2014-12-28 NOTE — Progress Notes (Signed)
No ischemia on nuc study, only scar from previous MI, will begin colchicine for pericarditis. Nurse will notify pt.  Dr. Meda Coffee believes echo EF is more accurate.

## 2014-12-28 NOTE — Progress Notes (Signed)
ANTICOAGULATION CONSULT NOTE - Follow Up  Pharmacy Consult for Heparin Indication: chest pain/ACS  Allergies  Allergen Reactions  . Lisinopril Anaphylaxis    Whole right side of face swelled.     Patient Measurements: Height: 5' 8.75" (174.6 cm) Weight: 248 lb 3.8 oz (112.6 kg) IBW/kg (Calculated) : 70.13 Heparin Dosing Weight: 95 kg  Vital Signs: Temp: 97.9 F (36.6 C) (08/22 0000) Temp Source: Oral (08/22 0000) BP: 168/120 mmHg (08/22 0746) Pulse Rate: 82 (08/22 0746)  Labs:  Recent Labs  12/26/14 1521 12/26/14 1648  12/26/14 2154  12/27/14 0410 12/27/14 1403 12/27/14 1932 12/28/14 0116  HGB 17.3*  --   --   --   --  15.6  --   --  15.2  HCT 49.5  --   --   --   --  45.9  --   --  45.1  PLT 261  --   --   --   --  225  --   --  240  APTT  --  35  --   --   --   --   --   --   --   LABPROT  --  13.8  --   --   --  14.5  --   --   --   INR  --  1.04  --   --   --  1.11  --   --   --   HEPARINUNFRC  --   --   --  0.40  --  0.38  --   --  0.43  CREATININE 1.60*  --   --   --   --  1.42*  --   --  1.49*  TROPONINI  --   --   < >  --   < > 0.09* 0.09* 0.08* 0.08*  < > = values in this interval not displayed.  Estimated Creatinine Clearance: 77.9 mL/min (by C-G formula based on Cr of 1.49).   Medications:  Infusions:  . sodium chloride 75 mL/hr at 12/28/14 0600  . heparin 1,200 Units/hr (12/27/14 1900)    Assessment: 84 yoM admitted 8/20 with chest pain.  PMH significant for HTN, MI, and current cigarette smoker.  Pharmacy is consulted to dose Heparin IV for r/o ACS.  Today, 12/28/2014:  Heparin level remains therapeutic on current rate of 1200 units/hr  CBC WNL stable  No reported bleeding  Troponin unchanged  Goal of Therapy:  Heparin level 0.3-0.7 units/ml Monitor platelets by anticoagulation protocol: Yes   Plan:  1) Continue IV heparin at 1200 units/hr 2) Daily heparin level and CBC 3) Per cardiology note, needs cath but holding off d/t renal  insufficiency.  If unable to pursue cath then plan is stress test.   Doreene Eland, PharmD, BCPS.   Pager: [phone] 12/28/2014 8:08 AM

## 2014-12-28 NOTE — Progress Notes (Addendum)
lexiscan myoview completed without complications.  Nuc results to follow. 

## 2014-12-29 ENCOUNTER — Encounter (HOSPITAL_COMMUNITY): Payer: Self-pay | Admitting: Student

## 2014-12-29 DIAGNOSIS — Z9119 Patient's noncompliance with other medical treatment and regimen: Secondary | ICD-10-CM

## 2014-12-29 DIAGNOSIS — I255 Ischemic cardiomyopathy: Secondary | ICD-10-CM

## 2014-12-29 LAB — CBC
HCT: 38.9 % — ABNORMAL LOW (ref 39.0–52.0)
Hemoglobin: 12.7 g/dL — ABNORMAL LOW (ref 13.0–17.0)
MCH: 29.5 pg (ref 26.0–34.0)
MCHC: 32.6 g/dL (ref 30.0–36.0)
MCV: 90.5 fL (ref 78.0–100.0)
PLATELETS: 191 10*3/uL (ref 150–400)
RBC: 4.3 MIL/uL (ref 4.22–5.81)
RDW: 14.6 % (ref 11.5–15.5)
WBC: 4.3 10*3/uL (ref 4.0–10.5)

## 2014-12-29 LAB — HEPARIN LEVEL (UNFRACTIONATED): Heparin Unfractionated: 0.28 IU/mL — ABNORMAL LOW (ref 0.30–0.70)

## 2014-12-29 MED ORDER — NITROGLYCERIN 0.4 MG SL SUBL: 0.4000 mg | SUBLINGUAL_TABLET | SUBLINGUAL | Status: DC | PRN

## 2014-12-29 MED ORDER — ACETAMINOPHEN 325 MG PO TABS: 325.0000 mg | ORAL_TABLET | ORAL | Status: DC | PRN

## 2014-12-29 MED ORDER — ISOSORBIDE MONONITRATE ER 30 MG PO TB24: 30.0000 mg | ORAL_TABLET | Freq: Every day | ORAL | Status: DC

## 2014-12-29 MED ORDER — HEPARIN SODIUM (PORCINE) 5000 UNIT/ML IJ SOLN: 5000.0000 [IU] | Freq: Three times a day (TID) | INTRAMUSCULAR | Status: DC

## 2014-12-29 MED ORDER — AMLODIPINE BESYLATE 10 MG PO TABS: 10.0000 mg | ORAL_TABLET | Freq: Every day | ORAL | Status: DC

## 2014-12-29 MED ORDER — ASPIRIN 325 MG PO TBEC: 325.0000 mg | DELAYED_RELEASE_TABLET | Freq: Every day | ORAL | Status: DC

## 2014-12-29 MED ORDER — COLCHICINE 0.6 MG PO TABS: 0.6000 mg | ORAL_TABLET | Freq: Two times a day (BID) | ORAL | Status: DC

## 2014-12-29 MED ORDER — CLONIDINE HCL 0.1 MG PO TABS: 0.1000 mg | ORAL_TABLET | Freq: Three times a day (TID) | ORAL | Status: DC

## 2014-12-29 MED ORDER — CLONIDINE HCL 0.1 MG PO TABS
0.1000 mg | ORAL_TABLET | Freq: Three times a day (TID) | ORAL | Status: DC
  Administered 2014-12-29: 0.1 mg via ORAL
  Filled 2014-12-29: qty 1

## 2014-12-29 MED ORDER — HYDRALAZINE HCL 50 MG PO TABS: 50.0000 mg | ORAL_TABLET | Freq: Four times a day (QID) | ORAL | Status: DC

## 2014-12-29 MED ORDER — ATORVASTATIN CALCIUM 80 MG PO TABS: 80.0000 mg | ORAL_TABLET | Freq: Every day | ORAL | Status: DC

## 2014-12-29 NOTE — Progress Notes (Signed)
Patient verbalized understanding of discharge instructions. Prescriptions and discharge papers given to [facility]. Patient is stable at discharge.

## 2014-12-29 NOTE — Progress Notes (Signed)
ANTICOAGULATION CONSULT NOTE - Follow Up  Pharmacy Consult for Heparin Indication: chest pain/ACS  Allergies  Allergen Reactions  . Lisinopril Anaphylaxis    Whole right side of face swelled.     Patient Measurements: Height: 5' 8.75" (174.6 cm) Weight: 248 lb 3.8 oz (112.6 kg) IBW/kg (Calculated) : 70.13 Heparin Dosing Weight: 95 kg  Vital Signs: Temp: 97.9 F (36.6 C) (08/23 0349) Temp Source: Oral (08/23 0349) BP: 114/86 mmHg (08/23 0600) Pulse Rate: 59 (08/23 0600)  Labs:  Recent Labs  12/26/14 1521 12/26/14 1648  12/27/14 0410 12/27/14 1403 12/27/14 1932 12/28/14 0116 12/29/14 0350  HGB 17.3*  --   --  15.6  --   --  15.2 12.7*  HCT 49.5  --   --  45.9  --   --  45.1 38.9*  PLT 261  --   --  225  --   --  240 191  APTT  --  35  --   --   --   --   --   --   LABPROT  --  13.8  --  14.5  --   --   --   --   INR  --  1.04  --  1.11  --   --   --   --   HEPARINUNFRC  --   --   < > 0.38  --   --  0.43 0.28*  CREATININE 1.60*  --   --  1.42*  --   --  1.49*  --   TROPONINI  --   --   < > 0.09* 0.09* 0.08* 0.08*  --   < > = values in this interval not displayed.  Estimated Creatinine Clearance: 77.9 mL/min (by C-G formula based on Cr of 1.49).   Medications:  Infusions:  . sodium chloride 75 mL/hr at 12/28/14 0600  . heparin 1,200 Units/hr (12/28/14 1400)    Assessment: Scott Salinas admitted 8/20 with chest pain.  PMH significant for HTN, MI, and current cigarette smoker.  Pharmacy is consulted to dose Heparin IV for r/o ACS.  Today, 12/29/2014:  Heparin level SUBtherapeutic this morning on heparin 1200 units/hr  CBC: Hgb trending down to 12.7, pltc trending down but remains WNL  No reported bleeding  Troponin unchanged  No ischemia on nuclear scan  Goal of Therapy:  Heparin level 0.3-0.7 units/ml Monitor platelets by anticoagulation protocol: Yes   Plan:   Seen by cardiology PA-C and orders to stop heparin gtt and change to VTE prophylaxis.  Start  heparin 5000 units SQ q8h this afternoon  No further adjustment needed.   Doreene Eland, PharmD, BCPS.   Pager: [phone] 12/29/2014 7:08 AM

## 2014-12-29 NOTE — Progress Notes (Addendum)
Patient Name: Scott Salinas Date of Encounter: 12/29/2014  Principal Problem:   Chest pain Active Problems:   Accelerated hypertension   Headache   History of noncompliance with medical treatment   Primary Cardiologist: New Patient Profile: 44yo male w/ PMH of HTN and CAD (s/p PCI 2006 - 2 stents) admitted for chest pain on 12/26/14.  SUBJECTIVE: Denies any chest pain or shortness of breath. Reports having a mild headache when taking the hydralazine. Reports some dizziness when standing up, but reports he has only been out of bed 3 times in the past few days.  OBJECTIVE Filed Vitals:   12/29/14 0349 12/29/14 0400 12/29/14 0600 12/29/14 0800  BP:  103/67 114/86   Pulse:  54 59   Temp: 97.9 F (36.6 C)   97.4 F (36.3 C)  TempSrc: Oral   Oral  Resp:  14 14   Height:      Weight:      SpO2:  96% 96%     Intake/Output Summary (Last 24 hours) at 12/29/14 0815 Last data filed at 12/29/14 0600  Gross per 24 hour  Intake   2574 ml  Output   2770 ml  Net   -196 ml   Filed Weights   12/26/14 1650 12/26/14 2145  Weight: 248 lb (112.492 kg) 248 lb 3.8 oz (112.6 kg)    PHYSICAL EXAM General: Well developed, well nourished, male in no acute distress. Head: Normocephalic, atraumatic.  Neck: Supple without bruits, JVD not elevated. Lungs:  Resp regular and unlabored, CTA without wheezing or rales. Heart: RRR, S1, S2, no S3, S4, or murmur; no rub. Abdomen: Soft, non-tender, non-distended with normoactive bowel sounds. No hepatomegaly. No rebound/guarding. No obvious abdominal masses. Extremities: No clubbing, cyanosis, edema. Distal pedal pulses are 2+ bilaterally. Neuro: Alert and oriented X 3. Moves all extremities spontaneously. Psych: Normal affect.  LABS: CBC: Recent Labs  12/27/14 0410 12/28/14 0116 12/29/14 0350  WBC 5.9 5.7 4.3  NEUTROABS 2.4  --   --   HGB 15.6 15.2 12.7*  HCT 45.9 45.1 38.9*  MCV 88.3 88.4 90.5  PLT 225 240 191   INR: Recent  Labs  12/27/14 0410  INR 0000000   Basic Metabolic Panel: Recent Labs  12/27/14 0410 12/28/14 0116  NA 136 135  K 3.2* 3.6  CL 103 103  CO2 26 24  GLUCOSE 97 101*  BUN 26* 22*  CREATININE 1.42* 1.49*  CALCIUM 8.8* 8.9   Liver Function Tests: Recent Labs  12/27/14 0410  AST 39  ALT 31  ALKPHOS 75  BILITOT 1.0  PROT 8.0  ALBUMIN 3.7   Cardiac Enzymes: Recent Labs  12/27/14 1403 12/27/14 1932 12/28/14 0116  TROPONINI 0.09* 0.08* 0.08*    Recent Labs  12/26/14 1520 12/26/14 1852  TROPIPOC 0.03 0.03    TELE:  Sinus bradycardia with rate in 40's -50's.      ECHO: 12/27/14 Study Conclusions - Left ventricle: The cavity size was normal. Wall thickness was increased in a pattern of moderate LVH. The estimated ejection fraction was 50%. Basal to mid anterolateral hypokinesis. Features are consistent with a pseudonormal left ventricular filling pattern, with concomitant abnormal relaxation and increased filling pressure (grade 2 diastolic dysfunction). E/medial e&' > 15, suggesting LV end diastolic pressure at least 20 mmHg. - Aortic valve: There was no stenosis. There was trivial regurgitation. - Mitral valve: There was mild regurgitation. - Left atrium: The atrium was mildly dilated. - Right ventricle: The cavity size  was normal. Systolic function was normal. - Pulmonary arteries: PA peak pressure: 40 mm Hg (S). - Inferior vena cava: The vessel was normal in size. The respirophasic diameter changes were in the normal range (>= 50%), consistent with normal central venous pressure.  Impressions: - Normal LV size with moderate LV hypertrophy. EF 50% with basal to mid anterolateral hypokinesis. Normal RV size and systolic function. Mild MR. Mild pulmonary hypertension.  Radiology/Studies: Nm Myocar Multi W/spect W/wall Motion / Ef: 12/28/2014    Downsloping ST segment depression ST segment depression was noted during  stress in the  II, III, V4, V5, V6 and aVF leads, beginning at 0 minutes of  stress.  T wave inversion was noted during stress, beginning at 0 minutes of  stress. T wave inversion persisted.  Defect 1: There is a medium defect of mild severity present in the basal  inferolateral and mid inferolateral location.  The left ventricular ejection fraction is moderately decreased (30-44%).  Findings consistent with prior myocardial infarction.  This is an intermediate risk study.      Current Medications:  . amLODipine  10 mg Oral Daily  . aspirin EC  325 mg Oral Daily  . atorvastatin  80 mg Oral q1800  . carvedilol  25 mg Oral BID WC  . Chlorhexidine Gluconate Cloth  6 each Topical Q0600  . cloNIDine  0.1 mg Oral TID  . colchicine  0.6 mg Oral BID  . hydrALAZINE  50 mg Oral 4 times per day  . isosorbide mononitrate  30 mg Oral Daily  . mupirocin ointment  1 application Nasal BID   . sodium chloride 75 mL/hr at 12/28/14 0600  . heparin 1,200 Units/hr (12/28/14 1400)    ASSESSMENT AND PLAN: 1. Elevated troponin likely secondary to pericarditis:  - history of CAD with prior stenting in 2006. Cyclic troponins have been 0.09, 0.08, and 0.08 respectively.   - stress test performed on 12/28/14 as an intermediate risk study, with no ischemia present, only scar from previous MI. - Colchicine 0.6mg  BID started 12/28/14 for probable pericarditis. Will need to continue for 3 months. Avoid NSAIDS due to Stage 3 CKD. Consider adding Aspirin or Prednisone for refractory chest pain.  2. Chronic combined systolic and diastolic CHF  - appears euvolemic - LVEF 50% on Echo 12/27/14 with Grade 2 Diastolic Dysfunction. - Continue carvedilol, hydralazine, and imdur. -  hold ACEI/ARB in the settings of acute on chronic kidney failure.  3. Acute on chronic kidney failure  -  Creatinine was 1.49 on 12/28/14.  4. Hypertension  - BP has been 125/85 - 189/110 in the past 24 hours. - currently on amlodipine 10 mg daily, coreg  25mg  BID, clonidine 0.1mg  qd, hydralazine 50mg  QID. Imdur 30 mg added 12/27/14.  5. Hyperlipidemia:  - continue Atorvastatin 80 mg po daily.   Possible discharge this PM pending sign-off. Patient is currently incarcerated and will follow-up with on-site provider.  Signed, Dineen Kid , PA-C 8:15 AM 12/29/2014   The patient was seen, examined and discussed with Dineen Kid, PA-C and I agree with the above.    44 year old male with known CAD, prior PCIs in 2006, known meds non-compliance, currently incarcerated, presented with atypical chest pain and low troponin elevation with flat trend in the settings of acute on chronic kidney failure and hypertensive urgency. The stress test showed an old inferior scar and no ischemia.  His pain is pleuritic, there is a suspicion for pericarditis, we started colchicine, continue  for 3 months, trying to avoid NSAIDS with CKD 3. He has mild LV dysfunction, but appears euvolemic. BP is now controlled, avoid ACEI/ARB with CKD stage 3.  He is ready for discharge, we will sign off.  Dorothy Spark 12/29/2014

## 2014-12-29 NOTE — Discharge Summary (Signed)
Physician Discharge Summary  Race Hider [id number] DOB: [birth year] DOA: 12/26/2014  PCP: PROVIDER NOT IN SYSTEM  Admit date: 12/26/2014 Discharge date: 12/29/2014  Time spent: 20 minutes  Recommendations for Outpatient Follow-up:  1. Follow with facility provider in 1-2 weeks  Discharge Diagnoses:  Principal Problem:   Chest pain Active Problems:   Accelerated hypertension   Headache   History of noncompliance with medical treatment   Discharge Condition: Stable  Diet recommendation: Heart healthy  Filed Weights   12/26/14 1650 12/26/14 2145  Weight: 112.492 kg (248 lb) 112.6 kg (248 lb 3.8 oz)    History of present illness:  Please see admit h and p from 8/20 for details. Briefly, pt presented with chest pains in the setting of known CAD. Patient was admitted for further work up.  Hospital Course: 1. Chest pain, concerns for unstable angina 1. Presented with sx of L sided chest pain with sob, diaphoresis, nausea and dizziness 2. Pt was empirically continued on heparin gtt 3. 2d echo with findings of normal LVEF and hypokinesis with LVH suggestive of grade 2 diastolic dysfunction 4. Patient continued with L sided chest pain improved with analgesics 5. Prior records reviewed. Pt was previously admitted in 2009 for chest pain, found to have reversible ischemia on stress test but chose to leave against medical advice before cath could be performed. Pt had been lost to follow up since 6. Trop has remained stable at around 0.09. 7. Pt underwent stress testing on 8/22 that was found to be an intermediate risk study which showed an old inferior scar and no ischemia 8. Cardiology started colchicine for pericarditis, advised against NSAIDs given decreased renal function 9. Patient was cleared for discharge per Cardiology 2. Accelerated HTN 1. Question compliance of medications 2. Initially cont home meds with PRN hydralazine 3. Amlodipine increased to 10mg , imdur  added, and Increased clonidine from 0.1mg  qday to 0.1mg  TID 4. BP much improved 5. Avoid ACEI/ARB given suspected chronic renal insufficiency 3. Suspected stage 3 CKD 1. Stable 4. Headache 1. Cont analgesics as tolerated 5. Anxiety 1. Seems stable presently 6. Hx noncompliance 1. Per above, historically has demonstrated noncompliance with cardiac recommendations 7. Hx polysubstance abuse 1. Drug screen pos for marijuana 8. DVT prophylaxis 1. On heparin gtt while admitted  Procedures:  Stress test 8/22 - showed old inferior scar, no ischemia  Consultations:  Cardiology  Discharge Exam: Filed Vitals:   12/29/14 0400 12/29/14 0600 12/29/14 0800 12/29/14 1120  BP: 103/67 114/86 139/89 134/77  Pulse: 54 59 63 51  Temp:   97.4 F (36.3 C)   TempSrc:   Oral   Resp: 14 14 19 16   Height:      Weight:      SpO2: 96% 96% 96% 97%    General: awake, in nad Cardiovascular: regular, s1, s2 Respiratory: normal resp effort,  No wheezing  Discharge Instructions     Medication List    STOP taking these medications        diphenhydrAMINE 25 MG tablet  Commonly known as:  BENADRYL     hydrochlorothiazide 12.5 MG tablet  Commonly known as:  HYDRODIURIL     predniSONE 20 MG tablet  Commonly known as:  DELTASONE      TAKE these medications        acetaminophen 325 MG tablet  Commonly known as:  TYLENOL  Take 1 tablet (325 mg total) by mouth every 4 (four) hours as needed for mild pain, moderate pain or  headache.     amLODipine 10 MG tablet  Commonly known as:  NORVASC  Take 1 tablet (10 mg total) by mouth daily.     aspirin 325 MG EC tablet  Take 1 tablet (325 mg total) by mouth daily.     atorvastatin 80 MG tablet  Commonly known as:  LIPITOR  Take 1 tablet (80 mg total) by mouth daily at 6 PM.     carvedilol 25 MG tablet  Commonly known as:  COREG  Take 25 mg by mouth 2 (two) times daily with a meal.     cloNIDine 0.1 MG tablet  Commonly known as:   CATAPRES  Take 1 tablet (0.1 mg total) by mouth 3 (three) times daily.     colchicine 0.6 MG tablet  Take 1 tablet (0.6 mg total) by mouth 2 (two) times daily.     famotidine 20 MG tablet  Commonly known as:  PEPCID  Take 2 tablets (40 mg total) by mouth at bedtime as needed for heartburn.     hydrALAZINE 50 MG tablet  Commonly known as:  APRESOLINE  Take 1 tablet (50 mg total) by mouth every 6 (six) hours.     isosorbide mononitrate 30 MG 24 hr tablet  Commonly known as:  IMDUR  Take 1 tablet (30 mg total) by mouth daily.     nitroGLYCERIN 0.4 MG SL tablet  Commonly known as:  NITROSTAT  Place 1 tablet (0.4 mg total) under the tongue every 5 (five) minutes as needed for chest pain.       Allergies  Allergen Reactions  . Lisinopril Anaphylaxis    Whole right side of face swelled.        Follow-up Information    Follow up with Follow up with facility provider in 1-2 weeks.   Why:  Hospital follow up       The results of significant diagnostics from this hospitalization (including imaging, microbiology, ancillary and laboratory) are listed below for reference.    Significant Diagnostic Studies: Dg Chest 2 View  12/26/2014   CLINICAL DATA:  Sharp left-sided chest pain and shortness of Breath  EXAM: CHEST - 2 VIEW  COMPARISON:  02/02/2013  FINDINGS: The heart size and mediastinal contours are within normal limits. Both lungs are clear. The visualized skeletal structures are unremarkable.  IMPRESSION: No active disease.   Electronically Signed   By: Inez Catalina M.D.   On: 12/26/2014 15:14   Nm Myocar Multi W/spect W/wall Motion / Ef  12/28/2014    Downsloping ST segment depression ST segment depression was noted during  stress in the II, III, V4, V5, V6 and aVF leads, beginning at 0 minutes of  stress.  T wave inversion was noted during stress, beginning at 0 minutes of  stress. T wave inversion persisted.  Defect 1: There is a medium defect of mild severity present in the  basal  inferolateral and mid inferolateral location.  The left ventricular ejection fraction is moderately decreased (30-44%).  Findings consistent with prior myocardial infarction.  This is an intermediate risk study.     Microbiology: Recent Results (from the past 240 hour(s))  MRSA PCR Screening     Status: Abnormal   Collection Time: 12/26/14 10:50 PM  Result Value Ref Range Status   MRSA by PCR POSITIVE (A) NEGATIVE Final    Comment:        The GeneXpert MRSA Assay (FDA approved for NASAL specimens only), is one component of a comprehensive  MRSA colonization surveillance program. It is not intended to diagnose MRSA infection nor to guide or monitor treatment for MRSA infections. RESULT CALLED TO, READ BACK BY AND VERIFIED WITH: KROLICZAK,A/2W @0202  ON 12/27/14 BY KARCZEWSKI,S.      Labs: Basic Metabolic Panel:  Recent Labs Lab 12/26/14 1521 12/27/14 0410 12/28/14 0116  NA 134* 136 135  K 3.6 3.2* 3.6  CL 100* 103 103  CO2 23 26 24   GLUCOSE 106* 97 101*  BUN 31* 26* 22*  CREATININE 1.60* 1.42* 1.49*  CALCIUM 9.7 8.8* 8.9   Liver Function Tests:  Recent Labs Lab 12/27/14 0410  AST 39  ALT 31  ALKPHOS 75  BILITOT 1.0  PROT 8.0  ALBUMIN 3.7   No results for input(s): LIPASE, AMYLASE in the last 168 hours. No results for input(s): AMMONIA in the last 168 hours. CBC:  Recent Labs Lab 12/26/14 1521 12/27/14 0410 12/28/14 0116 12/29/14 0350  WBC 7.2 5.9 5.7 4.3  NEUTROABS  --  2.4  --   --   HGB 17.3* 15.6 15.2 12.7*  HCT 49.5 45.9 45.1 38.9*  MCV 87.9 88.3 88.4 90.5  PLT 261 225 240 191   Cardiac Enzymes:  Recent Labs Lab 12/27/14 0025 12/27/14 0410 12/27/14 1403 12/27/14 1932 12/28/14 0116  TROPONINI 0.09* 0.09* 0.09* 0.08* 0.08*   BNP: BNP (last 3 results) No results for input(s): BNP in the last 8760 hours.  ProBNP (last 3 results) No results for input(s): PROBNP in the last 8760 hours.  CBG: No results for input(s): GLUCAP  in the last 168 hours.   Signed:  Ksenia Kunz K  [facility] 12/29/2014, 1:15 PM

## 2015-01-05 DIAGNOSIS — I2510 Atherosclerotic heart disease of native coronary artery without angina pectoris: Secondary | ICD-10-CM | POA: Insufficient documentation

## 2015-01-05 DIAGNOSIS — I2583 Coronary atherosclerosis due to lipid rich plaque: Secondary | ICD-10-CM

## 2015-01-11 ENCOUNTER — Emergency Department (HOSPITAL_COMMUNITY): Payer: Medicaid Other

## 2015-01-11 ENCOUNTER — Inpatient Hospital Stay (HOSPITAL_COMMUNITY)
Admission: EM | Admit: 2015-01-11 | Discharge: 2015-01-25 | DRG: 234 | Disposition: A | Discharge status: Home / Self Care | Payer: Medicaid Other | Attending: Thoracic Surgery (Cardiothoracic Vascular Surgery) | Admitting: Thoracic Surgery (Cardiothoracic Vascular Surgery)

## 2015-01-11 ENCOUNTER — Encounter (HOSPITAL_COMMUNITY): Payer: Self-pay | Admitting: *Deleted

## 2015-01-11 DIAGNOSIS — N183 Chronic kidney disease, stage 3 unspecified: Secondary | ICD-10-CM | POA: Insufficient documentation

## 2015-01-11 DIAGNOSIS — I129 Hypertensive chronic kidney disease with stage 1 through stage 4 chronic kidney disease, or unspecified chronic kidney disease: Secondary | ICD-10-CM | POA: Diagnosis present

## 2015-01-11 DIAGNOSIS — Z9114 Patient's other noncompliance with medication regimen: Secondary | ICD-10-CM | POA: Diagnosis present

## 2015-01-11 DIAGNOSIS — J9811 Atelectasis: Secondary | ICD-10-CM | POA: Diagnosis not present

## 2015-01-11 DIAGNOSIS — Z79899 Other long term (current) drug therapy: Secondary | ICD-10-CM

## 2015-01-11 DIAGNOSIS — Z9119 Patient's noncompliance with other medical treatment and regimen: Secondary | ICD-10-CM | POA: Diagnosis present

## 2015-01-11 DIAGNOSIS — E785 Hyperlipidemia, unspecified: Secondary | ICD-10-CM | POA: Diagnosis present

## 2015-01-11 DIAGNOSIS — I2510 Atherosclerotic heart disease of native coronary artery without angina pectoris: Secondary | ICD-10-CM | POA: Diagnosis present

## 2015-01-11 DIAGNOSIS — D62 Acute posthemorrhagic anemia: Secondary | ICD-10-CM | POA: Diagnosis not present

## 2015-01-11 DIAGNOSIS — I25110 Atherosclerotic heart disease of native coronary artery with unstable angina pectoris: Principal | ICD-10-CM | POA: Diagnosis present

## 2015-01-11 DIAGNOSIS — F17210 Nicotine dependence, cigarettes, uncomplicated: Secondary | ICD-10-CM | POA: Diagnosis present

## 2015-01-11 DIAGNOSIS — I2583 Coronary atherosclerosis due to lipid rich plaque: Secondary | ICD-10-CM

## 2015-01-11 DIAGNOSIS — Z955 Presence of coronary angioplasty implant and graft: Secondary | ICD-10-CM

## 2015-01-11 DIAGNOSIS — Z7982 Long term (current) use of aspirin: Secondary | ICD-10-CM

## 2015-01-11 DIAGNOSIS — Y831 Surgical operation with implant of artificial internal device as the cause of abnormal reaction of the patient, or of later complication, without mention of misadventure at the time of the procedure: Secondary | ICD-10-CM | POA: Diagnosis present

## 2015-01-11 DIAGNOSIS — T82857A Stenosis of cardiac prosthetic devices, implants and grafts, initial encounter: Secondary | ICD-10-CM | POA: Diagnosis present

## 2015-01-11 DIAGNOSIS — I252 Old myocardial infarction: Secondary | ICD-10-CM

## 2015-01-11 DIAGNOSIS — Z6837 Body mass index (BMI) 37.0-37.9, adult: Secondary | ICD-10-CM

## 2015-01-11 DIAGNOSIS — N179 Acute kidney failure, unspecified: Secondary | ICD-10-CM | POA: Diagnosis not present

## 2015-01-11 DIAGNOSIS — E8779 Other fluid overload: Secondary | ICD-10-CM | POA: Diagnosis not present

## 2015-01-11 DIAGNOSIS — R079 Chest pain, unspecified: Secondary | ICD-10-CM

## 2015-01-11 DIAGNOSIS — Z8249 Family history of ischemic heart disease and other diseases of the circulatory system: Secondary | ICD-10-CM

## 2015-01-11 DIAGNOSIS — F1290 Cannabis use, unspecified, uncomplicated: Secondary | ICD-10-CM | POA: Diagnosis present

## 2015-01-11 DIAGNOSIS — Z951 Presence of aortocoronary bypass graft: Secondary | ICD-10-CM

## 2015-01-11 LAB — BASIC METABOLIC PANEL
Anion gap: 6 (ref 5–15)
BUN: 17 mg/dL (ref 6–20)
CALCIUM: 9 mg/dL (ref 8.9–10.3)
CHLORIDE: 104 mmol/L (ref 101–111)
CO2: 27 mmol/L (ref 22–32)
CREATININE: 1.45 mg/dL — AB (ref 0.61–1.24)
GFR calc Af Amer: >60 mL/min (ref >60)
GFR calc non Af Amer: 57 mL/min — ABNORMAL LOW (ref >60)
GLUCOSE: 99 mg/dL (ref 65–99)
Potassium: 3.9 mmol/L (ref 3.5–5.1)
Sodium: 137 mmol/L (ref 135–145)

## 2015-01-11 LAB — I-STAT TROPONIN, ED: TROPONIN I, POC: 0.03 ng/mL (ref 0.00–0.08)

## 2015-01-11 LAB — CBC
HCT: 41.5 % (ref 39.0–52.0)
Hemoglobin: 14.1 g/dL (ref 13.0–17.0)
MCH: 30.7 pg (ref 26.0–34.0)
MCHC: 34 g/dL (ref 30.0–36.0)
MCV: 90.2 fL (ref 78.0–100.0)
PLATELETS: 275 10*3/uL (ref 150–400)
RBC: 4.6 MIL/uL (ref 4.22–5.81)
RDW: 14.9 % (ref 11.5–15.5)
WBC: 8.3 10*3/uL (ref 4.0–10.5)

## 2015-01-11 NOTE — ED Notes (Signed)
Pt reports L side cp since yesterday.  Reports pain radiates from L back to L chest.  Worse when taking a deep breath.  Pt describes pain as constant shooting pressure.  Pt also reports SOB when ambulating.

## 2015-01-12 ENCOUNTER — Encounter (HOSPITAL_COMMUNITY): Payer: Self-pay

## 2015-01-12 ENCOUNTER — Emergency Department (HOSPITAL_COMMUNITY): Payer: Medicaid Other

## 2015-01-12 DIAGNOSIS — I10 Essential (primary) hypertension: Secondary | ICD-10-CM | POA: Diagnosis not present

## 2015-01-12 DIAGNOSIS — N179 Acute kidney failure, unspecified: Secondary | ICD-10-CM | POA: Diagnosis not present

## 2015-01-12 DIAGNOSIS — F17210 Nicotine dependence, cigarettes, uncomplicated: Secondary | ICD-10-CM | POA: Diagnosis present

## 2015-01-12 DIAGNOSIS — F1290 Cannabis use, unspecified, uncomplicated: Secondary | ICD-10-CM | POA: Diagnosis present

## 2015-01-12 DIAGNOSIS — Z7982 Long term (current) use of aspirin: Secondary | ICD-10-CM | POA: Diagnosis not present

## 2015-01-12 DIAGNOSIS — R0789 Other chest pain: Secondary | ICD-10-CM | POA: Diagnosis not present

## 2015-01-12 DIAGNOSIS — Z01810 Encounter for preprocedural cardiovascular examination: Secondary | ICD-10-CM | POA: Diagnosis not present

## 2015-01-12 DIAGNOSIS — I209 Angina pectoris, unspecified: Secondary | ICD-10-CM | POA: Diagnosis not present

## 2015-01-12 DIAGNOSIS — Z9114 Patient's other noncompliance with medication regimen: Secondary | ICD-10-CM | POA: Diagnosis present

## 2015-01-12 DIAGNOSIS — T82857A Stenosis of cardiac prosthetic devices, implants and grafts, initial encounter: Secondary | ICD-10-CM | POA: Diagnosis present

## 2015-01-12 DIAGNOSIS — Z6837 Body mass index (BMI) 37.0-37.9, adult: Secondary | ICD-10-CM | POA: Diagnosis not present

## 2015-01-12 DIAGNOSIS — N183 Chronic kidney disease, stage 3 (moderate): Secondary | ICD-10-CM | POA: Diagnosis present

## 2015-01-12 DIAGNOSIS — Z955 Presence of coronary angioplasty implant and graft: Secondary | ICD-10-CM | POA: Diagnosis not present

## 2015-01-12 DIAGNOSIS — I25110 Atherosclerotic heart disease of native coronary artery with unstable angina pectoris: Secondary | ICD-10-CM | POA: Diagnosis present

## 2015-01-12 DIAGNOSIS — I25119 Atherosclerotic heart disease of native coronary artery with unspecified angina pectoris: Secondary | ICD-10-CM | POA: Diagnosis not present

## 2015-01-12 DIAGNOSIS — Z9119 Patient's noncompliance with other medical treatment and regimen: Secondary | ICD-10-CM | POA: Diagnosis present

## 2015-01-12 DIAGNOSIS — R079 Chest pain, unspecified: Secondary | ICD-10-CM | POA: Diagnosis present

## 2015-01-12 DIAGNOSIS — D62 Acute posthemorrhagic anemia: Secondary | ICD-10-CM | POA: Diagnosis not present

## 2015-01-12 DIAGNOSIS — J9811 Atelectasis: Secondary | ICD-10-CM | POA: Diagnosis not present

## 2015-01-12 DIAGNOSIS — I129 Hypertensive chronic kidney disease with stage 1 through stage 4 chronic kidney disease, or unspecified chronic kidney disease: Secondary | ICD-10-CM | POA: Diagnosis present

## 2015-01-12 DIAGNOSIS — Y831 Surgical operation with implant of artificial internal device as the cause of abnormal reaction of the patient, or of later complication, without mention of misadventure at the time of the procedure: Secondary | ICD-10-CM | POA: Diagnosis present

## 2015-01-12 DIAGNOSIS — Z79899 Other long term (current) drug therapy: Secondary | ICD-10-CM | POA: Diagnosis not present

## 2015-01-12 DIAGNOSIS — E8779 Other fluid overload: Secondary | ICD-10-CM | POA: Diagnosis not present

## 2015-01-12 DIAGNOSIS — Z8249 Family history of ischemic heart disease and other diseases of the circulatory system: Secondary | ICD-10-CM | POA: Diagnosis not present

## 2015-01-12 DIAGNOSIS — I252 Old myocardial infarction: Secondary | ICD-10-CM | POA: Diagnosis not present

## 2015-01-12 DIAGNOSIS — E785 Hyperlipidemia, unspecified: Secondary | ICD-10-CM | POA: Diagnosis present

## 2015-01-12 LAB — TROPONIN I
TROPONIN I: 0.05 ng/mL — AB (ref <0.031)
Troponin I: 0.05 ng/mL — ABNORMAL HIGH (ref <0.031)

## 2015-01-12 LAB — CBC
HEMATOCRIT: 42.6 % (ref 39.0–52.0)
HEMOGLOBIN: 14.6 g/dL (ref 13.0–17.0)
MCH: 30.7 pg (ref 26.0–34.0)
MCHC: 34.3 g/dL (ref 30.0–36.0)
MCV: 89.5 fL (ref 78.0–100.0)
Platelets: 256 10*3/uL (ref 150–400)
RBC: 4.76 MIL/uL (ref 4.22–5.81)
RDW: 15 % (ref 11.5–15.5)
WBC: 7.5 10*3/uL (ref 4.0–10.5)

## 2015-01-12 LAB — CREATININE, SERUM
Creatinine, Ser: 1.48 mg/dL — ABNORMAL HIGH (ref 0.61–1.24)
GFR, EST NON AFRICAN AMERICAN: 56 mL/min — AB (ref >60)

## 2015-01-12 LAB — GLUCOSE, CAPILLARY: Glucose-Capillary: 102 mg/dL — ABNORMAL HIGH (ref 65–99)

## 2015-01-12 MED ORDER — MORPHINE SULFATE (PF) 2 MG/ML IV SOLN
2.0000 mg | Freq: Once | INTRAVENOUS | Status: AC
  Administered 2015-01-12: 2 mg via INTRAVENOUS
  Filled 2015-01-12: qty 1

## 2015-01-12 MED ORDER — AMLODIPINE BESYLATE 10 MG PO TABS
10.0000 mg | ORAL_TABLET | Freq: Every day | ORAL | Status: DC
  Administered 2015-01-13 – 2015-01-19 (×7): 10 mg via ORAL
  Filled 2015-01-12 (×7): qty 1

## 2015-01-12 MED ORDER — SODIUM CHLORIDE 0.9 % IJ SOLN: 3.0000 mL | INTRAMUSCULAR | Status: DC | PRN

## 2015-01-12 MED ORDER — ALPRAZOLAM 0.5 MG PO TABS
1.0000 mg | ORAL_TABLET | Freq: Three times a day (TID) | ORAL | Status: DC | PRN
  Administered 2015-01-12 – 2015-01-19 (×10): 1 mg via ORAL
  Filled 2015-01-12 (×11): qty 2
  Filled 2015-01-12: qty 4
  Filled 2015-01-12: qty 2

## 2015-01-12 MED ORDER — COLCHICINE 0.6 MG PO TABS
0.6000 mg | ORAL_TABLET | Freq: Two times a day (BID) | ORAL | Status: DC
  Administered 2015-01-12 – 2015-01-25 (×24): 0.6 mg via ORAL
  Filled 2015-01-12 (×26): qty 1

## 2015-01-12 MED ORDER — ASPIRIN 325 MG PO TABS
325.0000 mg | ORAL_TABLET | Freq: Once | ORAL | Status: AC
  Administered 2015-01-12: 325 mg via ORAL
  Filled 2015-01-12: qty 1

## 2015-01-12 MED ORDER — SODIUM CHLORIDE 0.9 % IV BOLUS (SEPSIS)
1000.0000 mL | Freq: Once | INTRAVENOUS | Status: AC
  Administered 2015-01-12: 1000 mL via INTRAVENOUS

## 2015-01-12 MED ORDER — CLONIDINE HCL 0.1 MG PO TABS
0.2000 mg | ORAL_TABLET | Freq: Once | ORAL | Status: AC
  Administered 2015-01-12: 0.2 mg via ORAL
  Filled 2015-01-12: qty 2

## 2015-01-12 MED ORDER — CLONIDINE HCL 0.1 MG PO TABS: 0.1000 mg | ORAL_TABLET | Freq: Three times a day (TID) | ORAL | Status: DC

## 2015-01-12 MED ORDER — ATORVASTATIN CALCIUM 80 MG PO TABS
80.0000 mg | ORAL_TABLET | Freq: Every day | ORAL | Status: DC
  Administered 2015-01-12 – 2015-01-24 (×12): 80 mg via ORAL
  Filled 2015-01-12 (×12): qty 1

## 2015-01-12 MED ORDER — IOHEXOL 350 MG/ML SOLN
100.0000 mL | Freq: Once | INTRAVENOUS | Status: AC | PRN
  Administered 2015-01-12: 100 mL via INTRAVENOUS

## 2015-01-12 MED ORDER — MORPHINE SULFATE (PF) 2 MG/ML IV SOLN
2.0000 mg | INTRAVENOUS | Status: DC | PRN
  Administered 2015-01-12 – 2015-01-14 (×12): 2 mg via INTRAVENOUS
  Filled 2015-01-12 (×12): qty 1

## 2015-01-12 MED ORDER — NITROGLYCERIN 0.4 MG SL SUBL
0.4000 mg | SUBLINGUAL_TABLET | SUBLINGUAL | Status: AC | PRN
  Administered 2015-01-12 (×3): 0.4 mg via SUBLINGUAL

## 2015-01-12 MED ORDER — FAMOTIDINE 20 MG PO TABS: 40.0000 mg | ORAL_TABLET | Freq: Every evening | ORAL | Status: DC | PRN

## 2015-01-12 MED ORDER — ASPIRIN EC 325 MG PO TBEC
325.0000 mg | DELAYED_RELEASE_TABLET | Freq: Every day | ORAL | Status: DC
  Administered 2015-01-13 – 2015-01-19 (×6): 325 mg via ORAL
  Filled 2015-01-12 (×7): qty 1

## 2015-01-12 MED ORDER — SODIUM CHLORIDE 0.9 % IJ SOLN
3.0000 mL | Freq: Two times a day (BID) | INTRAMUSCULAR | Status: DC
  Administered 2015-01-12 – 2015-01-19 (×8): 3 mL via INTRAVENOUS

## 2015-01-12 MED ORDER — ONDANSETRON HCL 4 MG/2ML IJ SOLN
INTRAMUSCULAR | Status: AC
  Administered 2015-01-12: 4 mg
  Filled 2015-01-12: qty 2

## 2015-01-12 MED ORDER — CARVEDILOL 25 MG PO TABS
25.0000 mg | ORAL_TABLET | Freq: Two times a day (BID) | ORAL | Status: DC
  Administered 2015-01-12: 25 mg via ORAL
  Filled 2015-01-12 (×3): qty 1

## 2015-01-12 MED ORDER — ONDANSETRON HCL 4 MG PO TABS: 4.0000 mg | ORAL_TABLET | Freq: Four times a day (QID) | ORAL | Status: DC | PRN

## 2015-01-12 MED ORDER — HYDROCHLOROTHIAZIDE 25 MG PO TABS
12.5000 mg | ORAL_TABLET | Freq: Every day | ORAL | Status: DC
  Administered 2015-01-12 – 2015-01-13 (×2): 12.5 mg via ORAL
  Filled 2015-01-12 (×2): qty 1

## 2015-01-12 MED ORDER — CLONIDINE HCL 0.1 MG PO TABS
0.1000 mg | ORAL_TABLET | Freq: Three times a day (TID) | ORAL | Status: DC
  Administered 2015-01-12 – 2015-01-19 (×22): 0.1 mg via ORAL
  Filled 2015-01-12 (×22): qty 1

## 2015-01-12 MED ORDER — NITROGLYCERIN IN D5W 200-5 MCG/ML-% IV SOLN
5.0000 ug/min | INTRAVENOUS | Status: DC
  Administered 2015-01-13: 5 ug/min via INTRAVENOUS
  Administered 2015-01-13: 50 ug/min via INTRAVENOUS
  Filled 2015-01-12 (×2): qty 250

## 2015-01-12 MED ORDER — ENOXAPARIN SODIUM 40 MG/0.4ML ~~LOC~~ SOLN
40.0000 mg | SUBCUTANEOUS | Status: DC
  Administered 2015-01-12 – 2015-01-13 (×2): 40 mg via SUBCUTANEOUS
  Filled 2015-01-12 (×2): qty 0.4

## 2015-01-12 MED ORDER — CARVEDILOL 25 MG PO TABS
25.0000 mg | ORAL_TABLET | Freq: Two times a day (BID) | ORAL | Status: DC
  Administered 2015-01-12 – 2015-01-19 (×15): 25 mg via ORAL
  Filled 2015-01-12 (×15): qty 1

## 2015-01-12 MED ORDER — SODIUM CHLORIDE 0.9 % IV SOLN: 250.0000 mL | INTRAVENOUS | Status: DC | PRN

## 2015-01-12 MED ORDER — NITROGLYCERIN IN D5W 200-5 MCG/ML-% IV SOLN
0.0000 ug/min | Freq: Once | INTRAVENOUS | Status: AC
  Administered 2015-01-12: 10 ug/min via INTRAVENOUS
  Filled 2015-01-12: qty 250

## 2015-01-12 MED ORDER — NITROGLYCERIN 0.4 MG SL SUBL: 0.4000 mg | SUBLINGUAL_TABLET | SUBLINGUAL | Status: DC | PRN

## 2015-01-12 MED ORDER — ACETAMINOPHEN 325 MG PO TABS
650.0000 mg | ORAL_TABLET | Freq: Four times a day (QID) | ORAL | Status: DC | PRN
  Administered 2015-01-13 – 2015-01-19 (×5): 650 mg via ORAL
  Filled 2015-01-12 (×5): qty 2

## 2015-01-12 MED ORDER — HYDRALAZINE HCL 25 MG PO TABS
25.0000 mg | ORAL_TABLET | Freq: Three times a day (TID) | ORAL | Status: DC
  Administered 2015-01-12: 25 mg via ORAL
  Filled 2015-01-12 (×4): qty 1

## 2015-01-12 MED ORDER — ONDANSETRON HCL 4 MG/2ML IJ SOLN: 4.0000 mg | Freq: Four times a day (QID) | INTRAMUSCULAR | Status: DC | PRN

## 2015-01-12 MED ORDER — HYDRALAZINE HCL 25 MG PO TABS
25.0000 mg | ORAL_TABLET | Freq: Three times a day (TID) | ORAL | Status: DC
  Administered 2015-01-12 – 2015-01-13 (×4): 25 mg via ORAL
  Filled 2015-01-12 (×4): qty 1

## 2015-01-12 MED ORDER — MORPHINE SULFATE (PF) 4 MG/ML IV SOLN
4.0000 mg | Freq: Once | INTRAVENOUS | Status: AC
  Administered 2015-01-12: 4 mg via INTRAVENOUS
  Filled 2015-01-12: qty 1

## 2015-01-12 NOTE — ED Notes (Signed)
Cardiology PA at bedside. 
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
## 2015-01-12 NOTE — ED Notes (Signed)
Ntg Gtt increased to 89mcg/min; pt BP remains elevated and pt continues to complain of chest pain rated at a 6 on a scale of 0-10

## 2015-01-12 NOTE — Progress Notes (Signed)
[facility] response hx indicates pcp is raymond hilu [place] [address] [place] [address] [phone] EPI updated
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
## 2015-01-12 NOTE — ED Notes (Signed)
Pt states that he began having chest pain Sunday evening; pt states that it has been just aggrevating; pt states that holding his arm up over his head relieves the pain; pt concerned due to family hx

## 2015-01-12 NOTE — ED Notes (Signed)
MD at bedside. 

## 2015-01-12 NOTE — ED Notes (Signed)
Patient transported to CT 

## 2015-01-12 NOTE — ED Notes (Signed)
Spoke with Dr Blaine Hamper about patients BP and continued chest pain; Dr Blaine Hamper advised to change bed to step down and start a NTG gtt.

## 2015-01-12 NOTE — ED Notes (Signed)
Pt would like labs drawn from IV - RN aware.

## 2015-01-13 DIAGNOSIS — N183 Chronic kidney disease, stage 3 unspecified: Secondary | ICD-10-CM | POA: Insufficient documentation

## 2015-01-13 DIAGNOSIS — I25110 Atherosclerotic heart disease of native coronary artery with unstable angina pectoris: Principal | ICD-10-CM

## 2015-01-13 LAB — RAPID URINE DRUG SCREEN, HOSP PERFORMED
Amphetamines: NOT DETECTED
BARBITURATES: NOT DETECTED
Benzodiazepines: POSITIVE — AB
Cocaine: NOT DETECTED
OPIATES: POSITIVE — AB
TETRAHYDROCANNABINOL: POSITIVE — AB

## 2015-01-13 LAB — COMPREHENSIVE METABOLIC PANEL
ALBUMIN: 3.2 g/dL — AB (ref 3.5–5.0)
ALK PHOS: 66 U/L (ref 38–126)
ALT: 24 U/L (ref 17–63)
ANION GAP: 8 (ref 5–15)
AST: 26 U/L (ref 15–41)
BILIRUBIN TOTAL: 0.7 mg/dL (ref 0.3–1.2)
BUN: 10 mg/dL (ref 6–20)
CALCIUM: 8.8 mg/dL — AB (ref 8.9–10.3)
CO2: 26 mmol/L (ref 22–32)
Chloride: 104 mmol/L (ref 101–111)
Creatinine, Ser: 1.69 mg/dL — ABNORMAL HIGH (ref 0.61–1.24)
GFR calc Af Amer: 55 mL/min — ABNORMAL LOW (ref >60)
GFR, EST NON AFRICAN AMERICAN: 48 mL/min — AB (ref >60)
GLUCOSE: 93 mg/dL (ref 65–99)
Potassium: 3.7 mmol/L (ref 3.5–5.1)
Sodium: 138 mmol/L (ref 135–145)
TOTAL PROTEIN: 6.9 g/dL (ref 6.5–8.1)

## 2015-01-13 LAB — MAGNESIUM: MAGNESIUM: 1.9 mg/dL (ref 1.7–2.4)

## 2015-01-13 LAB — MRSA PCR SCREENING: MRSA by PCR: NEGATIVE

## 2015-01-13 MED ORDER — SODIUM CHLORIDE 0.9 % IV SOLN: 250.0000 mL | INTRAVENOUS | Status: DC | PRN

## 2015-01-13 MED ORDER — SODIUM CHLORIDE 0.9 % WEIGHT BASED INFUSION
3.0000 mL/kg/h | INTRAVENOUS | Status: AC
  Administered 2015-01-13: 3 mL/kg/h via INTRAVENOUS

## 2015-01-13 MED ORDER — SODIUM CHLORIDE 0.9 % IJ SOLN
3.0000 mL | Freq: Two times a day (BID) | INTRAMUSCULAR | Status: DC
  Administered 2015-01-13 – 2015-01-14 (×2): 3 mL via INTRAVENOUS

## 2015-01-13 MED ORDER — SODIUM CHLORIDE 0.9 % IJ SOLN: 3.0000 mL | INTRAMUSCULAR | Status: DC | PRN

## 2015-01-13 MED ORDER — ASPIRIN 81 MG PO CHEW
81.0000 mg | CHEWABLE_TABLET | ORAL | Status: AC
  Administered 2015-01-14: 81 mg via ORAL
  Filled 2015-01-13: qty 1

## 2015-01-13 MED ORDER — SODIUM CHLORIDE 0.9 % WEIGHT BASED INFUSION
1.0000 mL/kg/h | INTRAVENOUS | Status: DC
  Administered 2015-01-14: 1 mL/kg/h via INTRAVENOUS

## 2015-01-13 NOTE — Progress Notes (Signed)
PROGRESS NOTE  Acen Mcveigh [id number] DOB: [birth year] DOA: 01/11/2015 PCP: PROVIDER NOT IN SYSTEM  Assessment/Plan: Chest pain Cath tomm per cardiology -UDS  Hypertensive urgency Continue Coreg 25 mg by mouth twice a day, hydralazine 25 mg by mouth 3 times a day, Catapres 0.1 mg 3 times a day. D/C HCTZ -wean off nitro as tolerated  Hyperlipidemia Continue statin  CAD Patient has a history of 2 stents placed 10 years ago Continue nitroglycerin infusion, aspirin, statin, Coreg Cardiology consultation  C KD stage II Patient's creatinine is stable. Baseline 1.49, today creatinine is 1.45. Follow BMP in a.m.  Court tomm -note given by Education officer, museum   Code Status: full Family Communication: patient Disposition Plan:    Consultants:  cards  Procedures:      HPI/Subjective: Mild CP  Objective: Filed Vitals:   01/13/15 0847  BP: 145/95  Pulse: 71  Temp:   Resp:     Intake/Output Summary (Last 24 hours) at 01/13/15 1232 Last data filed at 01/13/15 0800  Gross per 24 hour  Intake    560 ml  Output      0 ml  Net    560 ml   Filed Weights   01/12/15 1750 01/13/15 1049  Weight: 118.389 kg (261 lb) 114.9 kg (253 lb 4.9 oz)    Exam:   General:  A+Ox3, NAD  Cardiovascular: rrr  Respiratory: clear  Abdomen: +BS, soft  Skin: multiple tatooes   Data Reviewed: Basic Metabolic Panel:  Recent Labs Lab 01/11/15 2311 01/12/15 1725 01/13/15 0420  NA 137  --  138  K 3.9  --  3.7  CL 104  --  104  CO2 27  --  26  GLUCOSE 99  --  93  BUN 17  --  10  CREATININE 1.45* 1.48* 1.69*  CALCIUM 9.0  --  8.8*  MG  --   --  1.9   Liver Function Tests:  Recent Labs Lab 01/13/15 0420  AST 26  ALT 24  ALKPHOS 66  BILITOT 0.7  PROT 6.9  ALBUMIN 3.2*   No results for input(s): LIPASE, AMYLASE in the last 168 hours. No results for input(s): AMMONIA in the last 168 hours. CBC:  Recent Labs Lab 01/11/15 2311 01/12/15 1725  WBC 8.3 7.5    HGB 14.1 14.6  HCT 41.5 42.6  MCV 90.2 89.5  PLT 275 256   Cardiac Enzymes:  Recent Labs Lab 01/12/15 0535 01/12/15 1140  TROPONINI 0.05* 0.05*   BNP (last 3 results) No results for input(s): BNP in the last 8760 hours.  ProBNP (last 3 results) No results for input(s): PROBNP in the last 8760 hours.  CBG:  Recent Labs Lab 01/12/15 2150  GLUCAP 102*    Recent Results (from the past 240 hour(s))  MRSA PCR Screening     Status: None   Collection Time: 01/13/15  2:04 AM  Result Value Ref Range Status   MRSA by PCR NEGATIVE NEGATIVE Final    Comment:        The GeneXpert MRSA Assay (FDA approved for NASAL specimens only), is one component of a comprehensive MRSA colonization surveillance program. It is not intended to diagnose MRSA infection nor to guide or monitor treatment for MRSA infections.      Studies: Dg Chest 2 View  01/12/2015   CLINICAL DATA:  44 year old male with chest pain  EXAM: CHEST  2 VIEW  COMPARISON:  Radiograph dated 12/26/2014  FINDINGS: The heart size and mediastinal  contours are within normal limits. Both lungs are clear. The visualized skeletal structures are unremarkable.  IMPRESSION: No active cardiopulmonary disease.   Electronically Signed   By: Anner Crete M.D.   On: 01/12/2015 00:10   Ct Angio Chest Aorta W/cm &/or Wo/cm  01/12/2015   CLINICAL DATA:  Left-sided chest pain radiating to the left shoulder for 2 days. Shortness of breath. Palpitations. Sweating. Coronary stent placement in 2006.  EXAM: CT ANGIOGRAPHY CHEST WITH CONTRAST  TECHNIQUE: Multidetector CT imaging of the chest was performed using the standard protocol during bolus administration of intravenous contrast. Multiplanar CT image reconstructions and MIPs were obtained to evaluate the vascular anatomy.  CONTRAST:  151mL OMNIPAQUE IOHEXOL 350 MG/ML SOLN  COMPARISON:  None.  FINDINGS: Noncontrast axial images of the chest obtained demonstrate calcified granulomas in the  left hilum. Coronary artery calcifications and stents. No significant aortic calcification. No evidence of intramural hematoma.  Images obtained during arterial phase of contrast injection demonstrate normal caliber thoracic aorta. No evidence of aortic dissection or aneurysm. Great vessel origins are patent.  Normal heart size. Mediastinal lymph nodes are not pathologically enlarged. Visualized central pulmonary arteries appear grossly patent although the bolus is not sufficient for complete exclusion of pulmonary emboli. Esophagus is decompressed.  Mild dependent atelectasis in the lung bases. No focal consolidation or airspace disease. Airways are patent. No pleural effusions. No pneumothorax.  Included portions of the upper abdominal organs are grossly unremarkable. Degenerative changes in the spine. No destructive bone lesions appreciated.  Review of the MIP images confirms the above findings.  IMPRESSION: Normal appearance of the thoracic aorta. No evidence of aneurysm or dissection. No evidence of active pulmonary disease.   Electronically Signed   By: Lucienne Capers M.D.   On: 01/12/2015 04:43    Scheduled Meds: . amLODipine  10 mg Oral Daily  . [START ON 01/14/2015] aspirin  81 mg Oral Pre-Cath  . aspirin EC  325 mg Oral Daily  . atorvastatin  80 mg Oral q1800  . carvedilol  25 mg Oral BID WC  . cloNIDine  0.1 mg Oral TID  . colchicine  0.6 mg Oral BID  . enoxaparin (LOVENOX) injection  40 mg Subcutaneous Q24H  . hydrALAZINE  25 mg Oral TID  . hydrochlorothiazide  12.5 mg Oral Daily  . sodium chloride  3 mL Intravenous Q12H  . sodium chloride  3 mL Intravenous Q12H   Continuous Infusions: . sodium chloride Stopped (01/13/15 1219)  . 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

## 2015-01-13 NOTE — Progress Notes (Signed)
CSW (Clinical Education officer, museum) notified pt is requesting CSW assistance because of scheduled court hearing tomorrow. Per pt request, CSW faxed letter notifying of admission and unknown dc date to pt lawyer. CSW also provided pt with copy of letter. Per pt, no further needs. CSW signing off.  [place], [place]

## 2015-01-13 NOTE — Progress Notes (Signed)
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 BP control still suboptimal. Had a more detailed discussion regarding his symptoms and he clearly has a longstanding pattern of exertional angina pectoris, in addition to recent exacerbation. The location and quality of his exertional complaints is identical to angina present in 2006 and reproduced with balloon inflation during angioplasty  CURRENT MEDS . amLODipine  10 mg Oral Daily  . [START ON 01/14/2015] aspirin  81 mg Oral Pre-Cath  . aspirin EC  325 mg Oral Daily  . atorvastatin  80 mg Oral q1800  . carvedilol  25 mg Oral BID WC  . cloNIDine  0.1 mg Oral TID  . colchicine  0.6 mg Oral BID  . enoxaparin (LOVENOX) injection  40 mg Subcutaneous Q24H  . hydrALAZINE  25 mg Oral TID  . hydrochlorothiazide  12.5 mg Oral Daily  . sodium chloride  3 mL Intravenous Q12H  . sodium chloride  3 mL Intravenous Q12H    OBJECTIVE   Intake/Output Summary (Last 24 hours) at 01/13/15 1115 Last data filed at 01/13/15 0800  Gross per 24 hour  Intake    560 ml  Output    600 ml  Net    -40 ml   Filed Weights   01/12/15 1750  Weight: 261 lb (118.389 kg)    PHYSICAL EXAM Filed Vitals:   01/12/15 2035 01/13/15 0011 01/13/15 0635 01/13/15 0847  BP: 164/92 135/73 146/60 145/95  Pulse: 72 71 76 71  Temp: 98.2 F (36.8 C) 98.9 F (37.2 C) 98.1 F (36.7 C)   TempSrc: Oral Oral Oral   Resp:      Height:      Weight:      SpO2: 97% 94% 95% 98%   General: Alert, oriented x3, no distress Head: no evidence of trauma, PERRL, EOMI, no exophtalmos or lid lag, no myxedema, no xanthelasma; normal ears, nose and oropharynx Neck: normal jugular venous pulsations and no hepatojugular reflux; brisk carotid pulses without delay and no carotid bruits Chest:  clear to auscultation, no signs of consolidation by percussion or palpation, normal fremitus, symmetrical and full respiratory excursions Cardiovascular: normal position and quality of the apical impulse, regular rhythm, normal first and second heart sounds, no rubs, loud S4 gallop, no murmur Abdomen: no tenderness or distention, no masses by palpation, no abnormal pulsatility or arterial bruits, normal bowel sounds, no hepatosplenomegaly Extremities: no clubbing, cyanosis or edema; extensive scarring around right knee, 2+ radial, ulnar and brachial pulses bilaterally; 2+ right femoral, posterior tibial and dorsalis pedis pulses; 2+ left femoral, posterior tibial and dorsalis pedis pulses; no subclavian or femoral bruits Neurological: grossly nonfocal  LABS  CBC  Recent Labs  01/11/15 2311 01/12/15 1725  WBC 8.3 7.5  HGB 14.1 14.6  HCT 41.5 42.6  MCV 90.2 89.5  PLT 275 [id number]   Basic Metabolic Panel  Recent Labs  01/11/15 2311 01/12/15 1725 01/13/15 0420  NA 137  --  138  K 3.9  --  3.7  CL 104  --  104  CO2 27  --  26  GLUCOSE 99  --  93  BUN 17  --  10  CREATININE 1.45* 1.48* 1.69*  CALCIUM 9.0  --  8.8*  MG  --   --  1.9  Liver Function Tests  Recent Labs  01/13/15 0420  AST 26  ALT 24  ALKPHOS 66  BILITOT 0.7  PROT 6.9  ALBUMIN 3.2*   No results for input(s): LIPASE, AMYLASE in the last 72 hours. Cardiac Enzymes  Recent Labs  01/12/15 0535 01/12/15 1140  TROPONINI 0.05* 0.05*   BNP Invalid input(s): POCBNP D-Dimer No results for input(s): DDIMER in the last 72 hours. Hemoglobin A1C No results for input(s): HGBA1C in the last 72 hours. Fasting Lipid Panel No results for input(s): CHOL, HDL, LDLCALC, TRIG, CHOLHDL, LDLDIRECT in the last 72 hours. Thyroid Function Tests No results for input(s): TSH, T4TOTAL, T3FREE, THYROIDAB in the last 72 hours.  Invalid input(s): [place]  Radiology Studies Imaging results have been reviewed and Dg Chest 2  View  01/12/2015   CLINICAL DATA:  44 year old male with chest pain  EXAM: CHEST  2 VIEW  COMPARISON:  Radiograph dated 12/26/2014  FINDINGS: The heart size and mediastinal contours are within normal limits. Both lungs are clear. The visualized skeletal structures are unremarkable.  IMPRESSION: No active cardiopulmonary disease.   Electronically Signed   By: Anner Crete M.D.   On: 01/12/2015 00:10   Ct Angio Chest Aorta W/cm &/or Wo/cm  01/12/2015   CLINICAL DATA:  Left-sided chest pain radiating to the left shoulder for 2 days. Shortness of breath. Palpitations. Sweating. Coronary stent placement in 2006.  EXAM: CT ANGIOGRAPHY CHEST WITH CONTRAST  TECHNIQUE: Multidetector CT imaging of the chest was performed using the standard protocol during bolus administration of intravenous contrast. Multiplanar CT image reconstructions and MIPs were obtained to evaluate the vascular anatomy.  CONTRAST:  164mL OMNIPAQUE IOHEXOL 350 MG/ML SOLN  COMPARISON:  None.  FINDINGS: Noncontrast axial images of the chest obtained demonstrate calcified granulomas in the left hilum. Coronary artery calcifications and stents. No significant aortic calcification. No evidence of intramural hematoma.  Images obtained during arterial phase of contrast injection demonstrate normal caliber thoracic aorta. No evidence of aortic dissection or aneurysm. Great vessel origins are patent.  Normal heart size. Mediastinal lymph nodes are not pathologically enlarged. Visualized central pulmonary arteries appear grossly patent although the bolus is not sufficient for complete exclusion of pulmonary emboli. Esophagus is decompressed.  Mild dependent atelectasis in the lung bases. No focal consolidation or airspace disease. Airways are patent. No pleural effusions. No pneumothorax.  Included portions of the upper abdominal organs are grossly unremarkable. Degenerative changes in the spine. No destructive bone lesions appreciated.  Review of the MIP  images confirms the above findings.  IMPRESSION: Normal appearance of the thoracic aorta. No evidence of aneurysm or dissection. No evidence of active pulmonary disease.   Electronically Signed   By: Lucienne Capers M.D.   On: 01/12/2015 04:43    TELE NSR  ASSESSMENT AND PLAN  Young man with established CAD, prior PCI, multiple poorly controlled coronary risk factors, including ongoing smoking, poorly controlled BP and a very strong family history of premature CAD (inluding his father and younger sister). He describes CCS class 2 exertional angina for months, recent exacerbation related to severe BP elevation following cessation of some of his BP meds. Now asymptomatic at rest and BP normal (almost consistently). Creatinine marginally increased from yesterday, really little change from 12/26/2014. Nuclear study showed a large fixed defect, but the presence of angina suggests he may have a very severe stenosis with ischemia at rest or a chronic total occlusion with collateral filling, inferolateral distribution (left circumflex stent 2006 , Dr. Leonia Reeves,  4.0 x 16 mm [employer] Liberte' in mid LCX, 3.20x28 mm [employer] Taxus in OM1).  Depressed EF could be related to myocardial hibernation rather than a true scar. Recommend coronary angio and PCI if indicated.   Sanda Klein, MD, [facility] [phone] office [phone] pager 01/13/2015 11:15 AM

## 2015-01-13 NOTE — Progress Notes (Signed)
Utilization review completed. Dima Ferrufino, RN, BSN. 
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
## 2015-01-14 ENCOUNTER — Encounter (HOSPITAL_COMMUNITY)
Admission: EM | Disposition: A | Discharge status: Home / Self Care | Payer: BLUE CROSS/BLUE SHIELD | Source: Home / Self Care | Attending: Thoracic Surgery (Cardiothoracic Vascular Surgery)

## 2015-01-14 DIAGNOSIS — N183 Chronic kidney disease, stage 3 (moderate): Secondary | ICD-10-CM

## 2015-01-14 HISTORY — PX: CARDIAC CATHETERIZATION: SHX172

## 2015-01-14 LAB — BASIC METABOLIC PANEL
ANION GAP: 8 (ref 5–15)
BUN: 12 mg/dL (ref 6–20)
CHLORIDE: 104 mmol/L (ref 101–111)
CO2: 25 mmol/L (ref 22–32)
CREATININE: 1.51 mg/dL — AB (ref 0.61–1.24)
Calcium: 8.8 mg/dL — ABNORMAL LOW (ref 8.9–10.3)
GFR calc non Af Amer: 55 mL/min — ABNORMAL LOW (ref >60)
Glucose, Bld: 88 mg/dL (ref 65–99)
POTASSIUM: 3.9 mmol/L (ref 3.5–5.1)
SODIUM: 137 mmol/L (ref 135–145)

## 2015-01-14 LAB — CBC
HEMATOCRIT: 40.5 % (ref 39.0–52.0)
HEMOGLOBIN: 14.1 g/dL (ref 13.0–17.0)
MCH: 30.7 pg (ref 26.0–34.0)
MCHC: 34.8 g/dL (ref 30.0–36.0)
MCV: 88 fL (ref 78.0–100.0)
Platelets: 224 10*3/uL (ref 150–400)
RBC: 4.6 MIL/uL (ref 4.22–5.81)
RDW: 15 % (ref 11.5–15.5)
WBC: 5.8 10*3/uL (ref 4.0–10.5)

## 2015-01-14 SURGERY — LEFT HEART CATH AND CORONARY ANGIOGRAPHY

## 2015-01-14 MED ORDER — HEPARIN (PORCINE) IN NACL 100-0.45 UNIT/ML-% IJ SOLN
1700.0000 [IU]/h | INTRAMUSCULAR | Status: DC
  Administered 2015-01-14: 1200 [IU]/h via INTRAVENOUS
  Administered 2015-01-16 – 2015-01-19 (×4): 1700 [IU]/h via INTRAVENOUS
  Filled 2015-01-14 (×7): qty 250

## 2015-01-14 MED ORDER — MIDAZOLAM HCL 2 MG/2ML IJ SOLN
INTRAMUSCULAR | Status: AC
  Filled 2015-01-14: qty 4

## 2015-01-14 MED ORDER — HEPARIN (PORCINE) IN NACL 2-0.9 UNIT/ML-% IJ SOLN
INTRAMUSCULAR | Status: AC
  Filled 2015-01-14: qty 1000

## 2015-01-14 MED ORDER — ISOSORBIDE MONONITRATE ER 30 MG PO TB24
30.0000 mg | ORAL_TABLET | Freq: Every day | ORAL | Status: DC
  Administered 2015-01-14 – 2015-01-19 (×6): 30 mg via ORAL
  Filled 2015-01-14 (×6): qty 1

## 2015-01-14 MED ORDER — LIDOCAINE HCL (PF) 1 % IJ SOLN
INTRAMUSCULAR | Status: AC
  Filled 2015-01-14: qty 30

## 2015-01-14 MED ORDER — FENTANYL CITRATE (PF) 100 MCG/2ML IJ SOLN
INTRAMUSCULAR | Status: AC
  Filled 2015-01-14: qty 4

## 2015-01-14 MED ORDER — HEPARIN SODIUM (PORCINE) 1000 UNIT/ML IJ SOLN
INTRAMUSCULAR | Status: DC | PRN
  Administered 2015-01-14: 5000 [IU] via INTRAVENOUS

## 2015-01-14 MED ORDER — VERAPAMIL HCL 2.5 MG/ML IV SOLN
INTRAVENOUS | Status: DC | PRN
  Administered 2015-01-14: 9 mL via INTRA_ARTERIAL

## 2015-01-14 MED ORDER — HYDROMORPHONE HCL 1 MG/ML IJ SOLN
1.0000 mg | INTRAMUSCULAR | Status: DC | PRN
  Administered 2015-01-14 – 2015-01-20 (×20): 1 mg via INTRAVENOUS
  Filled 2015-01-14 (×20): qty 1

## 2015-01-14 MED ORDER — LABETALOL HCL 5 MG/ML IV SOLN
INTRAVENOUS | Status: DC | PRN
  Administered 2015-01-14: 20 mg via INTRAVENOUS

## 2015-01-14 MED ORDER — SODIUM CHLORIDE 0.9 % IV SOLN
INTRAVENOUS | Status: AC
Start: 2015-01-14 — End: 2015-01-14
  Administered 2015-01-14: 17:00:00 via INTRAVENOUS

## 2015-01-14 MED ORDER — SODIUM CHLORIDE 0.9 % IJ SOLN
3.0000 mL | Freq: Two times a day (BID) | INTRAMUSCULAR | Status: DC
Start: 2015-01-14 — End: 2015-01-20
  Administered 2015-01-14 – 2015-01-19 (×8): 3 mL via INTRAVENOUS

## 2015-01-14 MED ORDER — HYDRALAZINE HCL 50 MG PO TABS
50.0000 mg | ORAL_TABLET | Freq: Three times a day (TID) | ORAL | Status: DC
  Filled 2015-01-14: qty 1

## 2015-01-14 MED ORDER — MIDAZOLAM HCL 2 MG/2ML IJ SOLN
INTRAMUSCULAR | Status: DC | PRN
  Administered 2015-01-14: 2 mg via INTRAVENOUS

## 2015-01-14 MED ORDER — FENTANYL CITRATE (PF) 100 MCG/2ML IJ SOLN
INTRAMUSCULAR | Status: DC | PRN
  Administered 2015-01-14: 50 ug via INTRAVENOUS

## 2015-01-14 MED ORDER — LABETALOL HCL 5 MG/ML IV SOLN
INTRAVENOUS | Status: AC
  Filled 2015-01-14: qty 4

## 2015-01-14 MED ORDER — HEPARIN SODIUM (PORCINE) 1000 UNIT/ML IJ SOLN
INTRAMUSCULAR | Status: AC
  Filled 2015-01-14: qty 1

## 2015-01-14 MED ORDER — SODIUM CHLORIDE 0.9 % IJ SOLN: 3.0000 mL | INTRAMUSCULAR | Status: DC | PRN

## 2015-01-14 MED ORDER — SODIUM CHLORIDE 0.9 % IV SOLN
250.0000 mL | INTRAVENOUS | Status: DC | PRN
  Administered 2015-01-19: 250 mL via INTRAVENOUS

## 2015-01-14 MED ORDER — OXYCODONE-ACETAMINOPHEN 5-325 MG PO TABS
1.0000 | ORAL_TABLET | ORAL | Status: DC | PRN
  Administered 2015-01-15 – 2015-01-18 (×8): 2 via ORAL
  Administered 2015-01-18: 1 via ORAL
  Administered 2015-01-19 – 2015-01-20 (×5): 2 via ORAL
  Filled 2015-01-14 (×13): qty 2

## 2015-01-14 MED ORDER — VERAPAMIL HCL 2.5 MG/ML IV SOLN
INTRAVENOUS | Status: AC
  Filled 2015-01-14: qty 2

## 2015-01-14 MED ORDER — HYDRALAZINE HCL 50 MG PO TABS
50.0000 mg | ORAL_TABLET | Freq: Three times a day (TID) | ORAL | Status: DC
  Administered 2015-01-14 – 2015-01-17 (×10): 50 mg via ORAL
  Filled 2015-01-14 (×9): qty 1

## 2015-01-14 SURGICAL SUPPLY — 11 items
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
## 2015-01-14 NOTE — H&P (View-Only) (Signed)
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  CURRENT MEDS . amLODipine  10 mg Oral Daily  . aspirin EC  325 mg Oral Daily  . atorvastatin  80 mg Oral q1800  . carvedilol  25 mg Oral BID WC  . cloNIDine  0.1 mg Oral TID  . colchicine  0.6 mg Oral BID  . enoxaparin (LOVENOX) injection  40 mg Subcutaneous Q24H  . hydrALAZINE  50 mg Oral TID  . isosorbide mononitrate  30 mg Oral Daily  . sodium chloride  3 mL Intravenous Q12H  . sodium chloride  3 mL Intravenous Q12H    OBJECTIVE   Intake/Output Summary (Last 24 hours) at 01/14/15 1002 Last data filed at 01/13/15 2230  Gross per 24 hour  Intake    960 ml  Output    625 ml  Net    335 ml   Filed Weights   01/12/15 1750 01/13/15 1049 01/14/15 0447  Weight: 261 lb (118.389 kg) 253 lb 4.9 oz (114.9 kg) 252 lb 9.6 oz (114.579 kg)    PHYSICAL EXAM Filed Vitals:   01/13/15 1049 01/13/15 1453 01/13/15 2127 01/14/15 0447  BP:  126/88 141/75 127/96  Pulse:  74 72   Temp:   98.5 F (36.9 C) 98.2 F (36.8 C)  TempSrc:   Oral Oral  Resp:   18 19  Height:      Weight: 253 lb 4.9 oz (114.9 kg)   252 lb 9.6 oz (114.579 kg)  SpO2:  99% 100% 100%   General: Alert, oriented x3, no distress Head: no evidence of trauma, PERRL, EOMI, no exophtalmos or lid lag, no myxedema, no xanthelasma; normal ears, nose and oropharynx Neck: normal jugular venous pulsations and no hepatojugular reflux; brisk carotid pulses without delay and no carotid bruits Chest: clear to auscultation, no signs of consolidation by percussion or palpation, normal fremitus, symmetrical and full respiratory excursions Cardiovascular: normal position and quality of the apical impulse, regular rhythm, normal first  and second heart sounds, no rubs, S4 gallop, no murmur Abdomen: no tenderness or distention, no masses by palpation, no abnormal pulsatility or arterial bruits, normal bowel sounds, no hepatosplenomegaly Extremities: no clubbing, cyanosis or edema; 2+ radial, ulnar and brachial pulses bilaterally; 2+ right femoral, posterior tibial and dorsalis pedis pulses; 2+ left femoral, posterior tibial and dorsalis pedis pulses; no subclavian or femoral bruits Neurological: grossly nonfocal  LABS  CBC  Recent Labs  01/12/15 1725 01/14/15 0540  WBC 7.5 5.8  HGB 14.6 14.1  HCT 42.6 40.5  MCV 89.5 88.0  PLT 256 [id number]   Basic Metabolic Panel  Recent Labs  01/13/15 0420 01/14/15 0540  NA 138 137  K 3.7 3.9  CL 104 104  CO2 26 25  GLUCOSE 93 88  BUN 10 12  CREATININE 1.69* 1.51*  CALCIUM 8.8* 8.8*  MG 1.9  --    Liver Function Tests  Recent Labs  01/13/15 0420  AST 26  ALT 24  ALKPHOS 66  BILITOT 0.7  PROT 6.9  ALBUMIN 3.2*   No results for input(s): LIPASE, AMYLASE in the last 72 hours. Cardiac Enzymes  Recent Labs  01/12/15 0535 01/12/15 1140  TROPONINI 0.05* 0.05*    Radiology Studies Imaging  results have been reviewed and No results found.  TELE Sinus bradycardia 45-55   ASSESSMENT AND PLAN  For cardiac cath today, delayed from yesterday. BP much better, but diastolic BP still unacceptably high. Increase hydralazine. No room to add more beta blocker or clonidine due to heart rate. Avoiding ACEi/ARB for upcoming cath, but these are not contraindicated.   Sanda Klein, MD, [facility] [phone] office [phone] pager 01/14/2015 10:02 AM

## 2015-01-14 NOTE — Progress Notes (Signed)
PROGRESS NOTE  Vanderbilt Portee [id number] DOB: 1970/09/02 DOA: 01/11/2015 PCP: PROVIDER NOT IN SYSTEM  Assessment/Plan: Chest pain Cath today - still on nitro  Hypertensive urgency Continue Coreg 25 mg by mouth twice a day, hydralazine 25 mg by mouth 3 times a day, Catapres 0.1 mg 3 times a day. D/C HCTZ -wean off nitro as tolerated  Hyperlipidemia Continue statin  CAD Patient has a history of 2 stents placed 10 years ago Continue nitroglycerin infusion, aspirin, statin, Coreg Cardiology consultation  CKD stage II Patient's creatinine is stable. Baseline 1.49, today creatinine is 1.45. Follow BMP in a.m.  Court tomm -note given by Education officer, museum   Code Status: full Family Communication: patient Disposition Plan: home after cath?? Or in AM?   Consultants:  cards  Procedures:      HPI/Subjective: Still with HA and chest pain  Objective: Filed Vitals:   01/14/15 0447  BP: 127/96  Pulse:   Temp: 98.2 F (36.8 C)  Resp: 19    Intake/Output Summary (Last 24 hours) at 01/14/15 0935 Last data filed at 01/13/15 2230  Gross per 24 hour  Intake    960 ml  Output    625 ml  Net    335 ml   Filed Weights   01/12/15 1750 01/13/15 1049 01/14/15 0447  Weight: 118.389 kg (261 lb) 114.9 kg (253 lb 4.9 oz) 114.579 kg (252 lb 9.6 oz)    Exam:   General:  A+Ox3, NAD  Cardiovascular: rrr  Respiratory: clear  Abdomen: +BS, soft  Skin: multiple tatooes   Data Reviewed: Basic Metabolic Panel:  Recent Labs Lab 01/11/15 2311 01/12/15 1725 01/13/15 0420 01/14/15 0540  NA 137  --  138 137  K 3.9  --  3.7 3.9  CL 104  --  104 104  CO2 27  --  26 25  GLUCOSE 99  --  93 88  BUN 17  --  10 12  CREATININE 1.45* 1.48* 1.69* 1.51*  CALCIUM 9.0  --  8.8* 8.8*  MG  --   --  1.9  --    Liver Function Tests:  Recent Labs Lab 01/13/15 0420  AST 26  ALT 24  ALKPHOS 66  BILITOT 0.7  PROT 6.9  ALBUMIN 3.2*   No results for input(s): LIPASE,  AMYLASE in the last 168 hours. No results for input(s): AMMONIA in the last 168 hours. CBC:  Recent Labs Lab 01/11/15 2311 01/12/15 1725 01/14/15 0540  WBC 8.3 7.5 5.8  HGB 14.1 14.6 14.1  HCT 41.5 42.6 40.5  MCV 90.2 89.5 88.0  PLT 275 256 224   Cardiac Enzymes:  Recent Labs Lab 01/12/15 0535 01/12/15 1140  TROPONINI 0.05* 0.05*   BNP (last 3 results) No results for input(s): BNP in the last 8760 hours.  ProBNP (last 3 results) No results for input(s): PROBNP in the last 8760 hours.  CBG:  Recent Labs Lab 01/12/15 2150  GLUCAP 102*    Recent Results (from the past 240 hour(s))  MRSA PCR Screening     Status: None   Collection Time: 01/13/15  2:04 AM  Result Value Ref Range Status   MRSA by PCR NEGATIVE NEGATIVE Final    Comment:        The GeneXpert MRSA Assay (FDA approved for NASAL specimens only), is one component of a comprehensive MRSA colonization surveillance program. It is not intended to diagnose MRSA infection nor to guide or monitor treatment for MRSA infections.  Studies: No results found.  Scheduled Meds: . amLODipine  10 mg Oral Daily  . aspirin EC  325 mg Oral Daily  . atorvastatin  80 mg Oral q1800  . carvedilol  25 mg Oral BID WC  . cloNIDine  0.1 mg Oral TID  . colchicine  0.6 mg Oral BID  . enoxaparin (LOVENOX) injection  40 mg Subcutaneous Q24H  . hydrALAZINE  25 mg Oral TID  . sodium chloride  3 mL Intravenous Q12H  . sodium chloride  3 mL Intravenous Q12H   Continuous Infusions: . sodium chloride Stopped (01/13/15 1219)  . 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

## 2015-01-14 NOTE — Interval H&P Note (Signed)
Cath Lab Visit (complete for each Cath Lab visit)  Clinical Evaluation Leading to the Procedure:   ACS: Yes.    Non-ACS:    Anginal Classification: CCS IV  Anti-ischemic medical therapy: Maximal Therapy (2 or more classes of medications)  Non-Invasive Test Results: Intermediate-risk stress test findings: cardiac mortality 1-3%/year  Prior CABG: No previous CABG      History and Physical Interval Note:  01/14/2015 2:36 PM  Scott Salinas  has presented today for surgery, with the diagnosis of cp  The various methods of treatment have been discussed with the patient and family. After consideration of risks, benefits and other options for treatment, the patient has consented to  Procedure(s): Left Heart Cath and Coronary Angiography (N/A) as a surgical intervention .  The patient's history has been reviewed, patient examined, no change in status, stable for surgery.  I have reviewed the patient's chart and labs.  Questions were answered to the patient's satisfaction.     Sherren Mocha

## 2015-01-14 NOTE — Care Management Note (Signed)
Case Management Note  Patient Details  Name: Scott Salinas MRN: [id number] Date of Birth: [birth year]  Subjective/Objective:    Pt admitted for Chest Pain.                 Action/Plan: No needs identified by CM at this time. Will continue to monitor.    Expected Discharge Date:   (unknown)               Expected Discharge Plan:  Home/Self Care  In-House Referral:  NA  Discharge planning Services  CM Consult  Post Acute Care Choice:  NA Choice offered to:  NA  DME Arranged:  N/A DME Agency:  NA  HH Arranged:  NA HH Agency:  NA  Status of Service:  Completed, signed off  Medicare Important Message Given:    Date Medicare IM Given:    Medicare IM give by:    Date Additional Medicare IM Given:    Additional Medicare Important Message give by:     If discussed at Media of Stay Meetings, dates discussed:    Additional Comments:  Bethena Roys, RN 01/14/2015, 11:53 AM

## 2015-01-14 NOTE — Progress Notes (Signed)
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  CURRENT MEDS . amLODipine  10 mg Oral Daily  . aspirin EC  325 mg Oral Daily  . atorvastatin  80 mg Oral q1800  . carvedilol  25 mg Oral BID WC  . cloNIDine  0.1 mg Oral TID  . colchicine  0.6 mg Oral BID  . enoxaparin (LOVENOX) injection  40 mg Subcutaneous Q24H  . hydrALAZINE  50 mg Oral TID  . isosorbide mononitrate  30 mg Oral Daily  . sodium chloride  3 mL Intravenous Q12H  . sodium chloride  3 mL Intravenous Q12H    OBJECTIVE   Intake/Output Summary (Last 24 hours) at 01/14/15 1002 Last data filed at 01/13/15 2230  Gross per 24 hour  Intake    960 ml  Output    625 ml  Net    335 ml   Filed Weights   01/12/15 1750 01/13/15 1049 01/14/15 0447  Weight: 261 lb (118.389 kg) 253 lb 4.9 oz (114.9 kg) 252 lb 9.6 oz (114.579 kg)    PHYSICAL EXAM Filed Vitals:   01/13/15 1049 01/13/15 1453 01/13/15 2127 01/14/15 0447  BP:  126/88 141/75 127/96  Pulse:  74 72   Temp:   98.5 F (36.9 C) 98.2 F (36.8 C)  TempSrc:   Oral Oral  Resp:   18 19  Height:      Weight: 253 lb 4.9 oz (114.9 kg)   252 lb 9.6 oz (114.579 kg)  SpO2:  99% 100% 100%   General: Alert, oriented x3, no distress Head: no evidence of trauma, PERRL, EOMI, no exophtalmos or lid lag, no myxedema, no xanthelasma; normal ears, nose and oropharynx Neck: normal jugular venous pulsations and no hepatojugular reflux; brisk carotid pulses without delay and no carotid bruits Chest: clear to auscultation, no signs of consolidation by percussion or palpation, normal fremitus, symmetrical and full respiratory excursions Cardiovascular: normal position and quality of the apical impulse, regular rhythm, normal first  and second heart sounds, no rubs, S4 gallop, no murmur Abdomen: no tenderness or distention, no masses by palpation, no abnormal pulsatility or arterial bruits, normal bowel sounds, no hepatosplenomegaly Extremities: no clubbing, cyanosis or edema; 2+ radial, ulnar and brachial pulses bilaterally; 2+ right femoral, posterior tibial and dorsalis pedis pulses; 2+ left femoral, posterior tibial and dorsalis pedis pulses; no subclavian or femoral bruits Neurological: grossly nonfocal  LABS  CBC  Recent Labs  01/12/15 1725 01/14/15 0540  WBC 7.5 5.8  HGB 14.6 14.1  HCT 42.6 40.5  MCV 89.5 88.0  PLT 256 [id number]   Basic Metabolic Panel  Recent Labs  01/13/15 0420 01/14/15 0540  NA 138 137  K 3.7 3.9  CL 104 104  CO2 26 25  GLUCOSE 93 88  BUN 10 12  CREATININE 1.69* 1.51*  CALCIUM 8.8* 8.8*  MG 1.9  --    Liver Function Tests  Recent Labs  01/13/15 0420  AST 26  ALT 24  ALKPHOS 66  BILITOT 0.7  PROT 6.9  ALBUMIN 3.2*   No results for input(s): LIPASE, AMYLASE in the last 72 hours. Cardiac Enzymes  Recent Labs  01/12/15 0535 01/12/15 1140  TROPONINI 0.05* 0.05*    Radiology Studies Imaging  results have been reviewed and No results found.  TELE Sinus bradycardia 45-55   ASSESSMENT AND PLAN  For cardiac cath today, delayed from yesterday. BP much better, but diastolic BP still unacceptably high. Increase hydralazine. No room to add more beta blocker or clonidine due to heart rate. Avoiding ACEi/ARB for upcoming cath, but these are not contraindicated.   Sanda Klein, MD, [facility] [phone] office [phone] pager 01/14/2015 10:02 AM

## 2015-01-14 NOTE — Progress Notes (Signed)
Spoke to Merrick PharmD about starting IV heparin in 2 hours per order.  Night shift RN(Alexis)  removing TR band now

## 2015-01-15 ENCOUNTER — Other Ambulatory Visit: Payer: Self-pay | Admitting: *Deleted

## 2015-01-15 ENCOUNTER — Encounter (HOSPITAL_COMMUNITY): Payer: Self-pay | Admitting: Cardiovascular Disease

## 2015-01-15 ENCOUNTER — Inpatient Hospital Stay (HOSPITAL_COMMUNITY): Payer: Medicaid Other

## 2015-01-15 DIAGNOSIS — R079 Chest pain, unspecified: Secondary | ICD-10-CM

## 2015-01-15 DIAGNOSIS — Z01810 Encounter for preprocedural cardiovascular examination: Secondary | ICD-10-CM

## 2015-01-15 DIAGNOSIS — I2510 Atherosclerotic heart disease of native coronary artery without angina pectoris: Secondary | ICD-10-CM

## 2015-01-15 DIAGNOSIS — I25110 Atherosclerotic heart disease of native coronary artery with unstable angina pectoris: Secondary | ICD-10-CM

## 2015-01-15 LAB — HEPARIN LEVEL (UNFRACTIONATED)
Heparin Unfractionated: 0.15 IU/mL — ABNORMAL LOW (ref 0.30–0.70)
Heparin Unfractionated: 0.24 IU/mL — ABNORMAL LOW (ref 0.30–0.70)
Heparin Unfractionated: 0.51 IU/mL (ref 0.30–0.70)

## 2015-01-15 LAB — CBC
HEMATOCRIT: 39.5 % (ref 39.0–52.0)
Hemoglobin: 13.3 g/dL (ref 13.0–17.0)
MCH: 30.2 pg (ref 26.0–34.0)
MCHC: 33.7 g/dL (ref 30.0–36.0)
MCV: 89.8 fL (ref 78.0–100.0)
Platelets: 208 10*3/uL (ref 150–400)
RBC: 4.4 MIL/uL (ref 4.22–5.81)
RDW: 15 % (ref 11.5–15.5)
WBC: 5.2 10*3/uL (ref 4.0–10.5)

## 2015-01-15 MED ORDER — WHITE PETROLATUM GEL
Status: AC
  Filled 2015-01-15: qty 1

## 2015-01-15 MED FILL — Heparin Sodium (Porcine) 2 Unit/ML in Sodium Chloride 0.9%: INTRAMUSCULAR | Qty: 500 | Status: AC

## 2015-01-15 NOTE — Progress Notes (Signed)
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 Cath 09/08 w/   SUBJECTIVE: Nitro off since yesterday, had some chest pain overnight. Struggling emotionally with the news.  OBJECTIVE Filed Vitals:   01/14/15 1539 01/14/15 1655 01/14/15 1936 01/15/15 0524  BP: 159/118 146/103 121/70 122/80  Pulse: 64  62 68  Temp:   97.6 F (36.4 C) 98.9 F (37.2 C)  TempSrc:   Oral Oral  Resp: 13  18 18   Height:      Weight:    257 lb 11.2 oz (116.892 kg)  SpO2: 100%  98% 96%    Intake/Output Summary (Last 24 hours) at 01/15/15 0914 Last data filed at 01/15/15 0815  Gross per 24 hour  Intake   1302 ml  Output   2200 ml  Net   -898 ml   Filed Weights   01/13/15 1049 01/14/15 0447 01/15/15 0524  Weight: 253 lb 4.9 oz (114.9 kg) 252 lb 9.6 oz (114.579 kg) 257 lb 11.2 oz (116.892 kg)    PHYSICAL EXAM General: Well developed, well nourished, male in no acute distress. Head: Normocephalic, atraumatic.  Neck: Supple without bruits, JVD. Lungs:  Resp regular and unlabored, CTA. Heart: RRR, S1, S2, no S3, S4, or murmur; no rub. Abdomen: Soft, non-tender, non-distended, BS + x 4.  Extremities: No clubbing, cyanosis, edema. R radial cath site without ecchymosis or hematoma Neuro: Alert and oriented X 3. Moves all extremities spontaneously. Psych: Normal affect.  LABS: CBC: Recent Labs  01/14/15 0540 01/15/15 0330  WBC 5.8 5.2  HGB 14.1 13.3  HCT 40.5 39.5  MCV 88.0 89.8  PLT 224 [id number]   Basic Metabolic Panel: Recent Labs  01/13/15 0420 01/14/15 0540  NA 138  137  K 3.7 3.9  CL 104 104  CO2 26 25  GLUCOSE 93 88  BUN 10 12  CREATININE 1.69* 1.51*  CALCIUM 8.8* 8.8*  MG 1.9  --    Liver Function Tests: Recent Labs  01/13/15 0420  AST 26  ALT 24  ALKPHOS 66  BILITOT 0.7  PROT 6.9  ALBUMIN 3.2*   Cardiac Enzymes: Recent Labs  01/12/15 1140  TROPONINI 0.05*    TELE:   SR, S brady, 40s at times.     Cath: 01/14/2015  Prox RCA lesion, 100% stenosed.  Prox Cx lesion, 50% stenosed. A bare metal stent was placed. The lesion was previously treated with a bare metal stent greater than two years ago.  Ost 1st Mrg to 1st Mrg lesion, 80% stenosed. A drug-eluting stent was placed. The lesion was previously treated with a drug-eluting stent greater than two years ago.  Prox LAD-1 lesion, 90% stenosed.  Prox LAD-2 lesion, 70% stenosed.  Mid LAD lesion, 50% stenosed.  1. Total occlusion of a small codominant right coronary artery 2. Severe stenosis of the proximal LAD and moderate segmental stenosis of the mid LAD 3. Severe in-stent restenosis in the first obtuse marginal branch of the circumflex  The patient has multivessel coronary artery disease and LV dysfunction. His LAD has diffuse disease and I  think he would be better treated with coronary bypass surgery utilizing a mammary graft. His left circumflex/OM is also not optimal for PCI considering that it involves that bifurcation and there is in-stent restenosis extending back to the ostium of a large branch. I do not think the distal RCA branches will be graftable. Plan for cardiac surgical consultation discussed with both the patient and his mother by telephone.   Radiology/Studies: No results found.   Current Medications:  . amLODipine  10 mg Oral Daily  . aspirin EC  325 mg Oral Daily  . atorvastatin  80 mg Oral q1800  . carvedilol  25 mg Oral BID WC  . cloNIDine  0.1 mg Oral TID  . colchicine  0.6 mg Oral BID  . hydrALAZINE  50 mg Oral TID  . isosorbide mononitrate   30 mg Oral Daily  . sodium chloride  3 mL Intravenous Q12H  . sodium chloride  3 mL Intravenous Q12H   . heparin 1,400 Units/hr (01/15/15 0419)    ASSESSMENT AND PLAN:   Coronary artery disease involving native coronary artery of native heart with unstable angina pectoris - cath results above. - spoke w/ TCTS, Dr Roxan Hockey will see pt. 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  I have reviewed the chart, notes and new data.  I agree with PA's note.  Key new complaints: occasional mild chest pain, did not tolerate headache with topical NTG Key examination changes: BP control greatly improved, no signs CHf Key new findings / data: creat 1.5, better than pre-cath  PLAN: Surgical evaluation for CABG.  Sanda Klein, MD, [place] [phone] 01/15/2015, 10:46 AM

## 2015-01-15 NOTE — Progress Notes (Signed)
ANTICOAGULATION CONSULT NOTE - Follow Up Consult  Pharmacy Consult for Heparin Indication: chest pain/ACS  Allergies  Allergen Reactions  . Lisinopril Anaphylaxis    Whole right side of face swelled.     Patient Measurements: Height: 5\' 8"  (172.7 cm) Weight: 257 lb 11.2 oz (116.892 kg) IBW/kg (Calculated) : 68.4 Heparin Dosing Weight: 95 kg  Vital Signs: Temp: 97.7 F (36.5 C) (09/09 1941) Temp Source: Oral (09/09 1941) BP: 109/69 mmHg (09/09 1941) Pulse Rate: 59 (09/09 1941)  Labs:  Recent Labs  01/13/15 0420 01/14/15 0540 01/15/15 0330 01/15/15 1035 01/15/15 2009  HGB  --  14.1 13.3  --   --   HCT  --  40.5 39.5  --   --   PLT  --  224 208  --   --   HEPARINUNFRC  --   --  0.15* 0.24* 0.51  CREATININE 1.69* 1.51*  --   --   --     Estimated Creatinine Clearance: 77.5 mL/min (by C-G formula based on Cr of 1.51).   Medications:  Scheduled:  . amLODipine  10 mg Oral Daily  . aspirin EC  325 mg Oral Daily  . atorvastatin  80 mg Oral q1800  . carvedilol  25 mg Oral BID WC  . cloNIDine  0.1 mg Oral TID  . colchicine  0.6 mg Oral BID  . hydrALAZINE  50 mg Oral TID  . isosorbide mononitrate  30 mg Oral Daily  . sodium chloride  3 mL Intravenous Q12H  . sodium chloride  3 mL Intravenous Q12H  . white petrolatum       Infusions:  . heparin 1,700 Units/hr (01/15/15 1800)    Assessment: 44 yo M s/p cath showing multivessel disease with plans for TCTS consult for possible CABG.  Pt continues on heparin infusion at 1700 units/hr.  Heparin level tonight is therapeutic at 0.51. No bleeding noted. TCTS consulted and plan is for CABG on Wednesday  9/14- 1st available.   Goal of Therapy:  Heparin level 0.3-0.7 units/ml Monitor platelets by anticoagulation protocol: Yes   Plan:  Continue IV heparin at 1700 units/hr. F/u AM heparin level to confirm remains therapeutic. Daily heparin level and CBC TCTS recommendations : Plan CABG on Wednesday  9/14- 1st available    Nicole Cella, RPh Clinical Pharmacist Pager: [phone]  01/15/2015 9:00 PM

## 2015-01-15 NOTE — Progress Notes (Signed)
ANTICOAGULATION CONSULT NOTE - Follow Up Consult  Pharmacy Consult for Heparin Indication: chest pain/ACS  Allergies  Allergen Reactions  . Lisinopril Anaphylaxis    Whole right side of face swelled.     Patient Measurements: Height: 5\' 8"  (172.7 cm) Weight: 257 lb 11.2 oz (116.892 kg) IBW/kg (Calculated) : 68.4 Heparin Dosing Weight: 95 kg  Vital Signs: Temp: 98.9 F (37.2 C) (09/09 0524) Temp Source: Oral (09/09 0524) BP: 125/73 mmHg (09/09 1105) Pulse Rate: 68 (09/09 0524)  Labs:  Recent Labs  01/12/15 1725 01/13/15 0420 01/14/15 0540 01/15/15 0330 01/15/15 1035  HGB 14.6  --  14.1 13.3  --   HCT 42.6  --  40.5 39.5  --   PLT 256  --  224 208  --   HEPARINUNFRC  --   --   --  0.15* 0.24*  CREATININE 1.48* 1.69* 1.51*  --   --     Estimated Creatinine Clearance: 77.5 mL/min (by C-G formula based on Cr of 1.51).   Medications:  Scheduled:  . amLODipine  10 mg Oral Daily  . aspirin EC  325 mg Oral Daily  . atorvastatin  80 mg Oral q1800  . carvedilol  25 mg Oral BID WC  . cloNIDine  0.1 mg Oral TID  . colchicine  0.6 mg Oral BID  . hydrALAZINE  50 mg Oral TID  . isosorbide mononitrate  30 mg Oral Daily  . sodium chloride  3 mL Intravenous Q12H  . sodium chloride  3 mL Intravenous Q12H   Infusions:  . heparin 1,400 Units/hr (01/15/15 1100)    Assessment: 44 yo M s/p cath showing multivessel disease with plans for TCTS consult for possible CABG.  Pt continues on heparin at 1400 units/hr.  Heparin level has increased with most recent rate change but is still subtherapeutic.  Goal of Therapy:  Heparin level 0.3-0.7 units/ml Monitor platelets by anticoagulation protocol: Yes   Plan:  Increase heparin to 1700 units/hr. Heparin level in 6 hours Daily heparin level and CBC Follow-up TCTS recommendations  [employer], Pharm.D., BCPS Clinical Pharmacist Pager [phone] 01/15/2015 2:06 PM
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
## 2015-01-15 NOTE — Progress Notes (Signed)
ANTICOAGULATION CONSULT NOTE - Follow Up Consult  Pharmacy Consult for heparin Indication: CAD awaiting CVTS consult   Labs:  Recent Labs  01/12/15 0535 01/12/15 1140  01/12/15 1725 01/13/15 0420 01/14/15 0540 01/15/15 0330  HGB  --   --   < > 14.6  --  14.1 13.3  HCT  --   --   --  42.6  --  40.5 39.5  PLT  --   --   --  256  --  224 208  HEPARINUNFRC  --   --   --   --   --   --  0.15*  CREATININE  --   --   --  1.48* 1.69* 1.51*  --   TROPONINI 0.05* 0.05*  --   --   --   --   --   < > = values in this interval not displayed.    Assessment: 44yo male subtherapeutic on heparin after started post-cath; lab drawn early and level may continue to increase.  Goal of Therapy:  Heparin level 0.3-0.7 units/ml   Plan:  Will increase heparin gtt by <2 units/kg/hr to 1400 units/hr and check level in 6hr.  Wynona Neat, PharmD, BCPS  01/15/2015,4:16 AM

## 2015-01-15 NOTE — Progress Notes (Signed)
VASCULAR LAB PRELIMINARY  PRELIMINARY  PRELIMINARY  PRELIMINARY  Pre-op Cardiac Surgery  Carotid Findings:  Bilateral:  1-39% ICA stenosis.  Vertebral artery flow is antegrade.     Upper Extremity Right Left  Brachial Pressures 128 Triphasic 121 Triphasic  Radial Waveforms Triphasic Triphasic  Ulnar Waveforms Triphasic Triphasic  Palmar Arch (Allen's Test) Normal Normal   Findings:  Doppler waveforms remained normal bilaterally with both radial and ulnar compressions.    Lower  Extremity Right Left  Dorsalis Pedis    Anterior Tibial    Posterior Tibial    Ankle/Brachial Indices      Findings:  Pedal pulses were palpable bilaterally at rest.   Mikeria Valin, RVS 01/15/2015, 4:23 PM
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
## 2015-01-16 LAB — BASIC METABOLIC PANEL
Anion gap: 6 (ref 5–15)
BUN: 11 mg/dL (ref 6–20)
CHLORIDE: 105 mmol/L (ref 101–111)
CO2: 26 mmol/L (ref 22–32)
CREATININE: 1.53 mg/dL — AB (ref 0.61–1.24)
Calcium: 8.9 mg/dL (ref 8.9–10.3)
GFR calc Af Amer: >60 mL/min (ref >60)
GFR calc non Af Amer: 54 mL/min — ABNORMAL LOW (ref >60)
GLUCOSE: 97 mg/dL (ref 65–99)
POTASSIUM: 4.1 mmol/L (ref 3.5–5.1)
SODIUM: 137 mmol/L (ref 135–145)

## 2015-01-16 LAB — CBC
HEMATOCRIT: 39.1 % (ref 39.0–52.0)
HEMOGLOBIN: 12.8 g/dL — AB (ref 13.0–17.0)
MCH: 29.6 pg (ref 26.0–34.0)
MCHC: 32.7 g/dL (ref 30.0–36.0)
MCV: 90.5 fL (ref 78.0–100.0)
Platelets: 209 10*3/uL (ref 150–400)
RBC: 4.32 MIL/uL (ref 4.22–5.81)
RDW: 15 % (ref 11.5–15.5)
WBC: 4.3 10*3/uL (ref 4.0–10.5)

## 2015-01-16 LAB — HEPARIN LEVEL (UNFRACTIONATED): Heparin Unfractionated: 0.61 IU/mL (ref 0.30–0.70)

## 2015-01-16 MED ORDER — HYDROXYZINE HCL 25 MG PO TABS
25.0000 mg | ORAL_TABLET | Freq: Three times a day (TID) | ORAL | Status: DC | PRN
  Administered 2015-01-16 – 2015-01-20 (×8): 25 mg via ORAL
  Filled 2015-01-16 (×9): qty 1

## 2015-01-16 MED ORDER — SENNA 8.6 MG PO TABS: 2.0000 | ORAL_TABLET | Freq: Every day | ORAL | Status: DC | PRN

## 2015-01-16 NOTE — Progress Notes (Signed)
ANTICOAGULATION CONSULT NOTE - Follow Up Consult  Pharmacy Consult for Heparin Indication: chest pain/ACS  Allergies  Allergen Reactions  . Lisinopril Anaphylaxis    Whole right side of face swelled.     Patient Measurements: Height: 5\' 8"  (172.7 cm) Weight: 250 lb 11.2 oz (113.717 kg) IBW/kg (Calculated) : 68.4 Heparin Dosing Weight: 95 kg  Vital Signs: Temp: 97.7 F (36.5 C) (09/10 0546) Temp Source: Oral (09/10 0546) BP: 137/83 mmHg (09/10 0849) Pulse Rate: 64 (09/10 0546)  Labs:  Recent Labs  01/14/15 0540  01/15/15 0330 01/15/15 1035 01/15/15 2009 01/16/15 0402  HGB 14.1  --  13.3  --   --  12.8*  HCT 40.5  --  39.5  --   --  39.1  PLT 224  --  208  --   --  209  HEPARINUNFRC  --   < > 0.15* 0.24* 0.51 0.61  CREATININE 1.51*  --   --   --   --  1.53*  < > = values in this interval not displayed.  Estimated Creatinine Clearance: 75.4 mL/min (by C-G formula based on Cr of 1.53).   Medications:  Scheduled:  . amLODipine  10 mg Oral Daily  . aspirin EC  325 mg Oral Daily  . atorvastatin  80 mg Oral q1800  . carvedilol  25 mg Oral BID WC  . cloNIDine  0.1 mg Oral TID  . colchicine  0.6 mg Oral BID  . hydrALAZINE  50 mg Oral TID  . isosorbide mononitrate  30 mg Oral Daily  . sodium chloride  3 mL Intravenous Q12H  . sodium chloride  3 mL Intravenous Q12H   Infusions:  . heparin 1,700 Units/hr (01/16/15 0900)    Assessment: 44 yo M s/p cath showing multivessel disease with plans for CABG on 9/14.  Pt continues on heparin infusion at 1700 units/hr.  Heparin level is therapeutic at 0.61. No bleeding noted or reported on flowsheets aside from "yellow/straw" urine, will monitor. Hgb 12.8, Plt 209  Goal of Therapy:  Heparin level 0.3-0.7 units/ml Monitor platelets by anticoagulation protocol: Yes   Plan:  Continue IV heparin at 1700 units/hr. Daily heparin level and CBC Monitor for s/sx of bleeding CABG 9/14  Governor Specking, PharmD Clinical Pharmacy  Resident Pager: [phone] 01/16/2015 10:27 AM
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
## 2015-01-17 LAB — CBC
HCT: 40.7 % (ref 39.0–52.0)
Hemoglobin: 13.7 g/dL (ref 13.0–17.0)
MCH: 30.3 pg (ref 26.0–34.0)
MCHC: 33.7 g/dL (ref 30.0–36.0)
MCV: 90 fL (ref 78.0–100.0)
Platelets: 220 10*3/uL (ref 150–400)
RBC: 4.52 MIL/uL (ref 4.22–5.81)
RDW: 15.1 % (ref 11.5–15.5)
WBC: 4.6 10*3/uL (ref 4.0–10.5)

## 2015-01-17 LAB — HEPARIN LEVEL (UNFRACTIONATED): Heparin Unfractionated: 0.51 IU/mL (ref 0.30–0.70)

## 2015-01-17 MED ORDER — HYDRALAZINE HCL 50 MG PO TABS
50.0000 mg | ORAL_TABLET | Freq: Four times a day (QID) | ORAL | Status: DC
  Administered 2015-01-17 – 2015-01-20 (×11): 50 mg via ORAL
  Filled 2015-01-17 (×11): qty 1

## 2015-01-17 MED ORDER — DOCUSATE SODIUM 100 MG PO CAPS
100.0000 mg | ORAL_CAPSULE | Freq: Two times a day (BID) | ORAL | Status: DC
  Administered 2015-01-17 – 2015-01-19 (×6): 100 mg via ORAL
  Filled 2015-01-17 (×6): qty 1
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
## 2015-01-17 NOTE — Progress Notes (Signed)
ANTICOAGULATION CONSULT NOTE - Follow Up Consult  Pharmacy Consult for Heparin Indication: chest pain/ACS  Allergies  Allergen Reactions  . Lisinopril Anaphylaxis    Whole right side of face swelled.     Patient Measurements: Height: 5\' 8"  (172.7 cm) Weight: 253 lb 11.2 oz (115.078 kg) IBW/kg (Calculated) : 68.4 Heparin Dosing Weight: 95 kg  Vital Signs: Temp: 97.7 F (36.5 C) (09/11 0506) Temp Source: Oral (09/11 0506) BP: 152/104 mmHg (09/11 0917) Pulse Rate: 65 (09/11 0506)  Labs:  Recent Labs  01/15/15 0330  01/15/15 2009 01/16/15 0402 01/17/15 0333  HGB 13.3  --   --  12.8* 13.7  HCT 39.5  --   --  39.1 40.7  PLT 208  --   --  209 220  HEPARINUNFRC 0.15*  < > 0.51 0.61 0.51  CREATININE  --   --   --  1.53*  --   < > = values in this interval not displayed.  Estimated Creatinine Clearance: 75.9 mL/min (by C-G formula based on Cr of 1.53).   Medications:  Scheduled:  . amLODipine  10 mg Oral Daily  . aspirin EC  325 mg Oral Daily  . atorvastatin  80 mg Oral q1800  . carvedilol  25 mg Oral BID WC  . cloNIDine  0.1 mg Oral TID  . colchicine  0.6 mg Oral BID  . hydrALAZINE  50 mg Oral 4 times per day  . isosorbide mononitrate  30 mg Oral Daily  . sodium chloride  3 mL Intravenous Q12H  . sodium chloride  3 mL Intravenous Q12H   Infusions:  . heparin 1,700 Units/hr (01/16/15 2107)    Assessment: 44 yo M s/p cath showing multivessel disease with plans for CABG on 9/14.  Pt continues on heparin infusion at 1700 units/hr.  Heparin level is therapeutic. No bleeding reported on flowsheets aside from "yellow/straw" urine, will monitor. Pt states there was a small amount of blood in his stool this morning d/t forcing a BM (first BM since 9/5). He is requesting docusate and I left a sticky note for physician.   HL 0.51, Hgb 13.7, Plt 220  Goal of Therapy:  Heparin level 0.3-0.7 units/ml Monitor platelets by anticoagulation protocol: Yes   Plan:  Continue IV  heparin at 1700 units/hr. Daily heparin level and CBC Monitor for s/sx of bleeding F/U initiation of docusate CABG 9/14  Governor Specking, PharmD Clinical Pharmacy Resident Pager: [phone] 01/17/2015 10:13 AM
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
## 2015-01-18 ENCOUNTER — Inpatient Hospital Stay (HOSPITAL_COMMUNITY): Payer: Medicaid Other

## 2015-01-18 DIAGNOSIS — I209 Angina pectoris, unspecified: Secondary | ICD-10-CM

## 2015-01-18 LAB — PULMONARY FUNCTION TEST
DL/VA % PRED: 99 %
DL/VA: 4.48 ml/min/mmHg/L
DLCO unc % pred: 108 %
DLCO unc: 32.02 ml/min/mmHg
FEF 25-75 POST: 4.3 L/s
FEF 25-75 Pre: 4.16 L/sec
FEF2575-%Change-Post: 3 %
FEF2575-%PRED-POST: 126 %
FEF2575-%Pred-Pre: 122 %
FEV1-%CHANGE-POST: 0 %
FEV1-%PRED-PRE: 125 %
FEV1-%Pred-Post: 126 %
FEV1-PRE: 4.08 L
FEV1-Post: 4.12 L
FEV1FVC-%CHANGE-POST: 0 %
FEV1FVC-%PRED-PRE: 98 %
FEV6-%Change-Post: 0 %
FEV6-%Pred-Post: 132 %
FEV6-%Pred-Pre: 131 %
FEV6-Post: 5.16 L
FEV6-Pre: 5.12 L
FEV6FVC-%Change-Post: 0 %
FEV6FVC-%Pred-Post: 101 %
FEV6FVC-%Pred-Pre: 101 %
FVC-%Change-Post: 0 %
FVC-%PRED-POST: 129 %
FVC-%PRED-PRE: 127 %
FVC-POST: 5.18 L
FVC-PRE: 5.13 L
POST FEV1/FVC RATIO: 80 %
PRE FEV6/FVC RATIO: 100 %
Post FEV6/FVC ratio: 100 %
Pre FEV1/FVC ratio: 80 %
RV % pred: 73 %
RV: 1.33 L
TLC % pred: 99 %
TLC: 6.52 L

## 2015-01-18 LAB — HEPARIN LEVEL (UNFRACTIONATED): HEPARIN UNFRACTIONATED: 0.43 [IU]/mL (ref 0.30–0.70)

## 2015-01-18 LAB — CBC
HCT: 40.4 % (ref 39.0–52.0)
HEMOGLOBIN: 13.4 g/dL (ref 13.0–17.0)
MCH: 29.8 pg (ref 26.0–34.0)
MCHC: 33.2 g/dL (ref 30.0–36.0)
MCV: 90 fL (ref 78.0–100.0)
PLATELETS: 224 10*3/uL (ref 150–400)
RBC: 4.49 MIL/uL (ref 4.22–5.81)
RDW: 15.1 % (ref 11.5–15.5)
WBC: 4.2 10*3/uL (ref 4.0–10.5)

## 2015-01-18 LAB — TROPONIN I
Troponin I: <0.03 ng/mL (ref <0.031)
Troponin I: 0.03 ng/mL (ref <0.031)

## 2015-01-18 MED ORDER — ALBUTEROL SULFATE (2.5 MG/3ML) 0.083% IN NEBU
2.5000 mg | INHALATION_SOLUTION | Freq: Once | RESPIRATORY_TRACT | Status: AC
  Administered 2015-01-18: 2.5 mg via RESPIRATORY_TRACT

## 2015-01-18 MED ORDER — ALPRAZOLAM 0.25 MG PO TABS: 0.2500 mg | ORAL_TABLET | ORAL | Status: DC | PRN

## 2015-01-18 NOTE — Progress Notes (Signed)
4 Days Post-Op Procedure(s) (LRB): Left Heart Cath and Coronary Angiography (N/A) Subjective: Has had persistent back pain. troponins are negative   Objective: Vital signs in last 24 hours: Temp:  [97.7 F (36.5 C)-98.5 F (36.9 C)] 97.7 F (36.5 C) (09/12 1600) Pulse Rate:  [59-77] 65 (09/12 1700) Cardiac Rhythm:  [-] Normal sinus rhythm (09/12 1000) Resp:  [18-22] 18 (09/12 1600) BP: (122-151)/(66-127) 137/88 mmHg (09/12 1700) SpO2:  [96 %-100 %] 100 % (09/12 1600) Weight:  [251 lb 1.6 oz (113.898 kg)] 251 lb 1.6 oz (113.898 kg) (09/12 0500)  Hemodynamic parameters for last 24 hours:    Intake/Output from previous day: 09/11 0701 - 09/12 0700 In: 480 [P.O.:480] Out: 1700 [Urine:1700] Intake/Output this shift: Total I/O In: 960 [P.O.:960] Out: 1525 [Urine:1525]  General appearance: alert and no distress Neurologic: intact Heart: regular rate and rhythm  Lab Results:  Recent Labs  01/17/15 0333 01/18/15 0347  WBC 4.6 4.2  HGB 13.7 13.4  HCT 40.7 40.4  PLT 220 224   BMET:  Recent Labs  01/16/15 0402  NA 137  K 4.1  CL 105  CO2 26  GLUCOSE 97  BUN 11  CREATININE 1.53*  CALCIUM 8.9    PT/INR: No results for input(s): LABPROT, INR in the last 72 hours. ABG    Component Value Date/Time   TCO2 18 03/17/2008 0755   CBG (last 3)  No results for input(s): GLUCAP in the last 72 hours.  Assessment/Plan: S/P Procedure(s) (LRB): Left Heart Cath and Coronary Angiography (N/A) -For CABG on Wednesday PFTs reviewed- no issues Will check creatinine again in AM   LOS: 6 days    Scott Salinas 01/18/2015
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
## 2015-01-18 NOTE — Care Management Note (Signed)
Case Management Note  Patient Details  Name: Scott Salinas MRN: [id number] Date of Birth: [birth year]  Subjective/Objective:         severe 3-vessel CAD, for CABG on 01/20/15           Action/Plan: Pt scheduled for surgery on 01/20/2015, pt states he has some anxiety about surgery. He is currently on disability and received a check each month. Will have family at home to assist him after surgery. He can afford his medications. NCM will continue to follow for dc needs.   Expected Discharge Date:    01/25/2015           Expected Discharge Plan:  Home/Self Care   Discharge planning Services  CM Consult   Status of Service:  Completed, signed off  Medicare Important Message Given:    Date Medicare IM Given:    Medicare IM give by:    Date Additional Medicare IM Given:    Additional Medicare Important Message give by:     If discussed at Powers of Stay Meetings, dates discussed:    Additional Comments:  Erenest Rasher, RN 01/18/2015, 2:38 PM

## 2015-01-18 NOTE — Progress Notes (Signed)
ANTICOAGULATION CONSULT NOTE - Follow Up Consult  Pharmacy Consult for Heparin Indication: severe 3-vessel CAD, for CABG on 01/20/15  Allergies  Allergen Reactions  . Lisinopril Anaphylaxis    Whole right side of face swelled.     Patient Measurements: Height: 5\' 8"  (172.7 cm) Weight: 251 lb 1.6 oz (113.898 kg) IBW/kg (Calculated) : 68.4 Heparin Dosing Weight: 95 kg  Vital Signs: Temp: 97.9 F (36.6 C) (09/12 1000) Temp Source: Oral (09/12 1000) BP: 142/112 mmHg (09/12 1000) Pulse Rate: 71 (09/12 1000)  Labs:  Recent Labs  01/16/15 0402 01/17/15 0333 01/18/15 0347 01/18/15 1019  HGB 12.8* 13.7 13.4  --   HCT 39.1 40.7 40.4  --   PLT 209 220 224  --   HEPARINUNFRC 0.61 0.51 0.43  --   CREATININE 1.53*  --   --   --   TROPONINI  --   --   --  <0.03    Estimated Creatinine Clearance: 75.5 mL/min (by C-G formula based on Cr of 1.53).  Assessment:  Heparin level remains therapeutic (0.43) on 1700 units/hr.  CBC stable.   Small amount of blood in stool with forced BM on 9/11; now on Docuate BID. No futher BM yet.    For OHS on 01/20/15.  Goal of Therapy:  Heparin level 0.3-0.7 units/ml Monitor platelets by anticoagulation protocol: Yes   Plan:   Continue heparin drip at 1700 units/hr.  Daily heparin level and CBC; q3day bmet in am.  Follow up for any further blood in stool. Patient will report.  For OHS on 01/20/15.  Arty Baumgartner, [place] Pager: [phone] 01/18/2015,12:19 PM
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
## 2015-01-19 ENCOUNTER — Inpatient Hospital Stay (HOSPITAL_COMMUNITY): Payer: Medicaid Other

## 2015-01-19 LAB — CBC
HCT: 42.2 % (ref 39.0–52.0)
Hemoglobin: 14.1 g/dL (ref 13.0–17.0)
MCH: 29.7 pg (ref 26.0–34.0)
MCHC: 33.4 g/dL (ref 30.0–36.0)
MCV: 89 fL (ref 78.0–100.0)
PLATELETS: 239 10*3/uL (ref 150–400)
RBC: 4.74 MIL/uL (ref 4.22–5.81)
RDW: 15 % (ref 11.5–15.5)
WBC: 3.9 10*3/uL — AB (ref 4.0–10.5)

## 2015-01-19 LAB — BLOOD GAS, ARTERIAL
Acid-base deficit: 1.5 mmol/L (ref 0.0–2.0)
Bicarbonate: 22.7 mEq/L (ref 20.0–24.0)
DRAWN BY: 103701
FIO2: 0.21
O2 SAT: 95.5 %
PH ART: 7.389 (ref 7.350–7.450)
Patient temperature: 98.6
TCO2: 23.9 mmol/L (ref 0–100)
pCO2 arterial: 38.5 mmHg (ref 35.0–45.0)
pO2, Arterial: 82.8 mmHg (ref 80.0–100.0)

## 2015-01-19 LAB — URINALYSIS, ROUTINE W REFLEX MICROSCOPIC
Bilirubin Urine: NEGATIVE
GLUCOSE, UA: NEGATIVE mg/dL
HGB URINE DIPSTICK: NEGATIVE
Ketones, ur: NEGATIVE mg/dL
LEUKOCYTES UA: NEGATIVE
Nitrite: NEGATIVE
Protein, ur: NEGATIVE mg/dL
SPECIFIC GRAVITY, URINE: 1.006 (ref 1.005–1.030)
UROBILINOGEN UA: 0.2 mg/dL (ref 0.0–1.0)
pH: 5.5 (ref 5.0–8.0)

## 2015-01-19 LAB — SURGICAL PCR SCREEN
MRSA, PCR: NEGATIVE
Staphylococcus aureus: NEGATIVE

## 2015-01-19 LAB — TYPE AND SCREEN
ABO/RH(D): O POS
ANTIBODY SCREEN: NEGATIVE

## 2015-01-19 LAB — HEPARIN LEVEL (UNFRACTIONATED): HEPARIN UNFRACTIONATED: 0.56 [IU]/mL (ref 0.30–0.70)

## 2015-01-19 LAB — BASIC METABOLIC PANEL
ANION GAP: 7 (ref 5–15)
BUN: 10 mg/dL (ref 6–20)
CALCIUM: 9.4 mg/dL (ref 8.9–10.3)
CO2: 24 mmol/L (ref 22–32)
Chloride: 106 mmol/L (ref 101–111)
Creatinine, Ser: 1.4 mg/dL — ABNORMAL HIGH (ref 0.61–1.24)
GFR, EST NON AFRICAN AMERICAN: 60 mL/min — AB (ref >60)
GLUCOSE: 107 mg/dL — AB (ref 65–99)
Potassium: 3.8 mmol/L (ref 3.5–5.1)
SODIUM: 137 mmol/L (ref 135–145)

## 2015-01-19 LAB — PROTIME-INR
INR: 1.04 (ref 0.00–1.49)
PROTHROMBIN TIME: 13.8 s (ref 11.6–15.2)

## 2015-01-19 LAB — ABO/RH: ABO/RH(D): O POS

## 2015-01-19 LAB — APTT: APTT: 134 s — AB (ref 24–37)

## 2015-01-19 MED ORDER — METOPROLOL TARTRATE 12.5 MG HALF TABLET
12.5000 mg | ORAL_TABLET | Freq: Once | ORAL | Status: AC
  Administered 2015-01-20: 12.5 mg via ORAL
  Filled 2015-01-19: qty 1

## 2015-01-19 MED ORDER — POTASSIUM CHLORIDE 2 MEQ/ML IV SOLN
80.0000 meq | INTRAVENOUS | Status: DC
  Filled 2015-01-19: qty 40

## 2015-01-19 MED ORDER — TEMAZEPAM 15 MG PO CAPS
15.0000 mg | ORAL_CAPSULE | Freq: Once | ORAL | Status: DC | PRN
Start: 2015-01-19 — End: 2015-01-20

## 2015-01-19 MED ORDER — EPINEPHRINE HCL 1 MG/ML IJ SOLN
0.0000 ug/min | INTRAMUSCULAR | Status: DC
  Filled 2015-01-19: qty 4

## 2015-01-19 MED ORDER — CHLORHEXIDINE GLUCONATE 4 % EX LIQD
60.0000 mL | Freq: Once | CUTANEOUS | Status: AC
  Administered 2015-01-19: 4 via TOPICAL
  Filled 2015-01-19: qty 60

## 2015-01-19 MED ORDER — BISACODYL 5 MG PO TBEC
5.0000 mg | DELAYED_RELEASE_TABLET | Freq: Once | ORAL | Status: AC
  Administered 2015-01-19: 5 mg via ORAL
  Filled 2015-01-19: qty 1

## 2015-01-19 MED ORDER — CHLORHEXIDINE GLUCONATE 4 % EX LIQD
60.0000 mL | Freq: Once | CUTANEOUS | Status: AC
  Administered 2015-01-20: 4 via TOPICAL
  Filled 2015-01-19: qty 60

## 2015-01-19 MED ORDER — SODIUM CHLORIDE 0.9 % IV SOLN
INTRAVENOUS | Status: AC
  Administered 2015-01-20: .7 [IU]/h via INTRAVENOUS
  Filled 2015-01-19: qty 2.5

## 2015-01-19 MED ORDER — DIAZEPAM 5 MG PO TABS
5.0000 mg | ORAL_TABLET | Freq: Once | ORAL | Status: AC
  Administered 2015-01-20: 5 mg via ORAL
  Filled 2015-01-19: qty 1

## 2015-01-19 MED ORDER — PLASMA-LYTE 148 IV SOLN
INTRAVENOUS | Status: AC
  Administered 2015-01-20: 500 mL
  Filled 2015-01-19: qty 2.5

## 2015-01-19 MED ORDER — AMINOCAPROIC ACID 250 MG/ML IV SOLN
INTRAVENOUS | Status: AC
  Administered 2015-01-20: 14:00:00 via INTRAVENOUS
  Administered 2015-01-20: 69.8 mL/h via INTRAVENOUS
  Filled 2015-01-19: qty 40

## 2015-01-19 MED ORDER — DEXTROSE 5 % IV SOLN
1.5000 g | INTRAVENOUS | Status: AC
  Administered 2015-01-20: 750 mg via INTRAVENOUS
  Administered 2015-01-20: 1500 mg via INTRAVENOUS
  Filled 2015-01-19: qty 1.5

## 2015-01-19 MED ORDER — NITROGLYCERIN IN D5W 200-5 MCG/ML-% IV SOLN
2.0000 ug/min | INTRAVENOUS | Status: AC
  Administered 2015-01-20: 5 ug/min via INTRAVENOUS
  Filled 2015-01-19: qty 250

## 2015-01-19 MED ORDER — SODIUM CHLORIDE 0.9 % IV SOLN
INTRAVENOUS | Status: DC
  Filled 2015-01-19: qty 30

## 2015-01-19 MED ORDER — DOPAMINE-DEXTROSE 3.2-5 MG/ML-% IV SOLN
0.0000 ug/kg/min | INTRAVENOUS | Status: AC
  Administered 2015-01-20: 3 ug/kg/min via INTRAVENOUS
  Filled 2015-01-19: qty 250

## 2015-01-19 MED ORDER — VANCOMYCIN HCL 10 G IV SOLR
1250.0000 mg | INTRAVENOUS | Status: AC
  Administered 2015-01-20: 1250 mg via INTRAVENOUS
  Filled 2015-01-19 (×2): qty 1250

## 2015-01-19 MED ORDER — DEXTROSE 5 % IV SOLN
750.0000 mg | INTRAVENOUS | Status: DC
  Filled 2015-01-19: qty 750

## 2015-01-19 MED ORDER — DEXMEDETOMIDINE HCL IN NACL 400 MCG/100ML IV SOLN
0.1000 ug/kg/h | INTRAVENOUS | Status: AC
  Administered 2015-01-20: 15:00:00 via INTRAVENOUS
  Administered 2015-01-20: .3 ug/kg/h via INTRAVENOUS
  Filled 2015-01-19: qty 100

## 2015-01-19 MED ORDER — PHENYLEPHRINE HCL 10 MG/ML IJ SOLN
30.0000 ug/min | INTRAVENOUS | Status: DC
  Filled 2015-01-19: qty 2

## 2015-01-19 MED ORDER — MAGNESIUM SULFATE 50 % IJ SOLN
40.0000 meq | INTRAMUSCULAR | Status: DC
  Filled 2015-01-19: qty 10

## 2015-01-19 NOTE — Progress Notes (Signed)
ANTICOAGULATION CONSULT NOTE - Follow Up Consult  Pharmacy Consult for Heparin Indication: severe 3-vessel CAD, for CABG on 01/20/15  Allergies  Allergen Reactions  . Lisinopril Anaphylaxis    Whole right side of face swelled.     Patient Measurements: Height: 5\' 8"  (172.7 cm) Weight: 249 lb 8 oz (113.172 kg) IBW/kg (Calculated) : 68.4 Heparin Dosing Weight: 95 kg  Vital Signs: Temp: 97.6 F (36.4 C) (09/13 0500) Temp Source: Oral (09/13 0500) BP: 124/80 mmHg (09/13 0500) Pulse Rate: 56 (09/13 0500)  Labs:  Recent Labs  01/17/15 0333 01/18/15 0347 01/18/15 1019 01/18/15 1515 01/18/15 2154 01/19/15 0108  HGB 13.7 13.4  --   --   --  14.1  HCT 40.7 40.4  --   --   --  42.2  PLT 220 224  --   --   --  239  APTT  --   --   --   --   --  134*  LABPROT  --   --   --   --   --  13.8  INR  --   --   --   --   --  1.04  HEPARINUNFRC 0.51 0.43  --   --   --  0.56  CREATININE  --   --   --   --   --  1.40*  TROPONINI  --   --  <0.03 <0.03 0.03  --     Estimated Creatinine Clearance: 82.2 mL/min (by C-G formula based on Cr of 1.4).  Assessment: Heparin level remains therapeutic (0.43) on 1700 units/hr.  CBC stable.  For OHS on 01/20/15.  Goal of Therapy:  Heparin level 0.3-0.7 units/ml Monitor platelets by anticoagulation protocol: Yes   Plan:  Continue heparin drip at 1700 units/hr. Daily heparin level and CBC; q3day bmet in am. For OHS on 01/20/15.  Thank you Anette Guarneri, PharmD [phone] 01/19/2015,8:52 AM

## 2015-01-19 NOTE — Progress Notes (Signed)
5 Days Post-Op Procedure(s) (LRB): Left Heart Cath and Coronary Angiography (N/A) Subjective: Still has his baseline discomfort No shortness of breath  Objective: Vital signs in last 24 hours: Temp:  [97.6 F (36.4 C)-98.6 F (37 C)] 98.6 F (37 C) (09/13 1610) Pulse Rate:  [56-82] 82 (09/13 1610) Cardiac Rhythm:  [-] Normal sinus rhythm (09/13 0900) Resp:  [18] 18 (09/13 1610) BP: (105-141)/(72-88) 141/88 mmHg (09/13 1610) SpO2:  [96 %-98 %] 98 % (09/13 1610) Weight:  [249 lb 8 oz (113.172 kg)] 249 lb 8 oz (113.172 kg) (09/13 0500)  Hemodynamic parameters for last 24 hours:    Intake/Output from previous day: 09/12 0701 - 09/13 0700 In: 960 [P.O.:960] Out: 3825 [Urine:3825] Intake/Output this shift: Total I/O In: 480 [P.O.:480] Out: -   exam unchanged  Lab Results:  Recent Labs  01/18/15 0347 01/19/15 0108  WBC 4.2 3.9*  HGB 13.4 14.1  HCT 40.4 42.2  PLT 224 239   BMET:  Recent Labs  01/19/15 0108  NA 137  K 3.8  CL 106  CO2 24  GLUCOSE 107*  BUN 10  CREATININE 1.40*  CALCIUM 9.4    PT/INR:  Recent Labs  01/19/15 0108  LABPROT 13.8  INR 1.04   ABG    Component Value Date/Time   PHART 7.389 01/19/2015 1213   HCO3 22.7 01/19/2015 1213   TCO2 23.9 01/19/2015 1213   ACIDBASEDEF 1.5 01/19/2015 1213   O2SAT 95.5 01/19/2015 1213   CBG (last 3)  No results for input(s): GLUCAP in the last 72 hours.  Assessment/Plan: S/P Procedure(s) (LRB): Left Heart Cath and Coronary Angiography (N/A) FOR CABG in AM  All questions answered Given unstable presentation will leave heparin running until surgery   LOS: 7 days    [name] 01/19/2015

## 2015-01-19 NOTE — Progress Notes (Signed)
Pt very anxious and upset about order for docusate, states he thought it was ordered because someone charted that he had blood in his stool, educated pt on need for docusate, explained that there was not an entry in his chart stating he had blood in his stool, also c/o being woke up for meds, states he is sleep deprived and would like to be left alone if sleeping, reassured pt we would cluster care from her on out, did explain that pre operative care would start early in the am tomorrow, pt stated that would be fine, verbalized understanding of other education, will continue to monitor closely. Edward Qualia RN
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
## 2015-01-20 ENCOUNTER — Encounter (HOSPITAL_COMMUNITY): Payer: Self-pay | Admitting: Certified Registered Nurse Anesthetist

## 2015-01-20 ENCOUNTER — Encounter (HOSPITAL_COMMUNITY)
Admission: EM | Disposition: A | Discharge status: Home / Self Care | Payer: BLUE CROSS/BLUE SHIELD | Source: Home / Self Care | Attending: Thoracic Surgery (Cardiothoracic Vascular Surgery)

## 2015-01-20 ENCOUNTER — Inpatient Hospital Stay (HOSPITAL_COMMUNITY): Payer: Medicaid Other

## 2015-01-20 ENCOUNTER — Inpatient Hospital Stay (HOSPITAL_COMMUNITY): Payer: Medicaid Other | Admitting: Anesthesiology

## 2015-01-20 DIAGNOSIS — Z951 Presence of aortocoronary bypass graft: Secondary | ICD-10-CM

## 2015-01-20 HISTORY — PX: CORONARY ARTERY BYPASS GRAFT: SHX141

## 2015-01-20 HISTORY — PX: TEE WITHOUT CARDIOVERSION: SHX5443

## 2015-01-20 LAB — BASIC METABOLIC PANEL
Anion gap: 7 (ref 5–15)
BUN: 13 mg/dL (ref 6–20)
CALCIUM: 9.6 mg/dL (ref 8.9–10.3)
CHLORIDE: 103 mmol/L (ref 101–111)
CO2: 26 mmol/L (ref 22–32)
CREATININE: 1.57 mg/dL — AB (ref 0.61–1.24)
GFR, EST NON AFRICAN AMERICAN: 52 mL/min — AB (ref >60)
Glucose, Bld: 125 mg/dL — ABNORMAL HIGH (ref 65–99)
Potassium: 3.8 mmol/L (ref 3.5–5.1)
SODIUM: 136 mmol/L (ref 135–145)

## 2015-01-20 LAB — POCT I-STAT, CHEM 8
BUN: 13 mg/dL (ref 6–20)
BUN: 11 mg/dL (ref 6–20)
BUN: 12 mg/dL (ref 6–20)
BUN: 12 mg/dL (ref 6–20)
BUN: 12 mg/dL (ref 6–20)
BUN: 12 mg/dL (ref 6–20)
CALCIUM ION: 1.28 mmol/L — AB (ref 1.12–1.23)
CHLORIDE: 105 mmol/L (ref 101–111)
CHLORIDE: 104 mmol/L (ref 101–111)
CREATININE: 1.2 mg/dL (ref 0.61–1.24)
CREATININE: 1.2 mg/dL (ref 0.61–1.24)
Calcium, Ion: 1.28 mmol/L — ABNORMAL HIGH (ref 1.12–1.23)
Calcium, Ion: 1.15 mmol/L (ref 1.12–1.23)
Calcium, Ion: 1.1 mmol/L — ABNORMAL LOW (ref 1.12–1.23)
Calcium, Ion: 1.29 mmol/L — ABNORMAL HIGH (ref 1.12–1.23)
Calcium, Ion: 1.25 mmol/L — ABNORMAL HIGH (ref 1.12–1.23)
Chloride: 105 mmol/L (ref 101–111)
Chloride: 104 mmol/L (ref 101–111)
Chloride: 105 mmol/L (ref 101–111)
Chloride: 106 mmol/L (ref 101–111)
Creatinine, Ser: 1.4 mg/dL — ABNORMAL HIGH (ref 0.61–1.24)
Creatinine, Ser: 1.3 mg/dL — ABNORMAL HIGH (ref 0.61–1.24)
Creatinine, Ser: 1.4 mg/dL — ABNORMAL HIGH (ref 0.61–1.24)
Creatinine, Ser: 1.4 mg/dL — ABNORMAL HIGH (ref 0.61–1.24)
Glucose, Bld: 94 mg/dL (ref 65–99)
Glucose, Bld: 100 mg/dL — ABNORMAL HIGH (ref 65–99)
Glucose, Bld: 95 mg/dL (ref 65–99)
Glucose, Bld: 170 mg/dL — ABNORMAL HIGH (ref 65–99)
Glucose, Bld: 140 mg/dL — ABNORMAL HIGH (ref 65–99)
Glucose, Bld: 126 mg/dL — ABNORMAL HIGH (ref 65–99)
HCT: 39 % (ref 39.0–52.0)
HCT: 33 % — ABNORMAL LOW (ref 39.0–52.0)
HEMATOCRIT: 44 % (ref 39.0–52.0)
HEMATOCRIT: 34 % — AB (ref 39.0–52.0)
HEMATOCRIT: 30 % — AB (ref 39.0–52.0)
HEMATOCRIT: 38 % — AB (ref 39.0–52.0)
HEMOGLOBIN: 15 g/dL (ref 13.0–17.0)
HEMOGLOBIN: 11.6 g/dL — AB (ref 13.0–17.0)
HEMOGLOBIN: 12.9 g/dL — AB (ref 13.0–17.0)
Hemoglobin: 13.3 g/dL (ref 13.0–17.0)
Hemoglobin: 11.2 g/dL — ABNORMAL LOW (ref 13.0–17.0)
Hemoglobin: 10.2 g/dL — ABNORMAL LOW (ref 13.0–17.0)
POTASSIUM: 4.4 mmol/L (ref 3.5–5.1)
POTASSIUM: 4.5 mmol/L (ref 3.5–5.1)
Potassium: 3.9 mmol/L (ref 3.5–5.1)
Potassium: 4.5 mmol/L (ref 3.5–5.1)
Potassium: 4.4 mmol/L (ref 3.5–5.1)
Potassium: 3.9 mmol/L (ref 3.5–5.1)
SODIUM: 139 mmol/L (ref 135–145)
SODIUM: 140 mmol/L (ref 135–145)
SODIUM: 138 mmol/L (ref 135–145)
SODIUM: 135 mmol/L (ref 135–145)
SODIUM: 140 mmol/L (ref 135–145)
Sodium: 140 mmol/L (ref 135–145)
TCO2: 25 mmol/L (ref 0–100)
TCO2: 24 mmol/L (ref 0–100)
TCO2: 25 mmol/L (ref 0–100)
TCO2: 24 mmol/L (ref 0–100)
TCO2: 22 mmol/L (ref 0–100)
TCO2: 22 mmol/L (ref 0–100)

## 2015-01-20 LAB — CBC
HCT: 37.8 % — ABNORMAL LOW (ref 39.0–52.0)
HCT: 41.3 % (ref 39.0–52.0)
HCT: 42.4 % (ref 39.0–52.0)
HCT: 43.4 % (ref 39.0–52.0)
HEMOGLOBIN: 14.6 g/dL (ref 13.0–17.0)
HEMOGLOBIN: 13.7 g/dL (ref 13.0–17.0)
Hemoglobin: 14.2 g/dL (ref 13.0–17.0)
Hemoglobin: 12.4 g/dL — ABNORMAL LOW (ref 13.0–17.0)
MCH: 29.9 pg (ref 26.0–34.0)
MCH: 29.7 pg (ref 26.0–34.0)
MCH: 29.8 pg (ref 26.0–34.0)
MCH: 29.2 pg (ref 26.0–34.0)
MCHC: 33.6 g/dL (ref 30.0–36.0)
MCHC: 33.2 g/dL (ref 30.0–36.0)
MCHC: 33.5 g/dL (ref 30.0–36.0)
MCHC: 32.8 g/dL (ref 30.0–36.0)
MCV: 88.8 fL (ref 78.0–100.0)
MCV: 89.1 fL (ref 78.0–100.0)
MCV: 89.2 fL (ref 78.0–100.0)
MCV: 89.6 fL (ref 78.0–100.0)
PLATELETS: 233 10*3/uL (ref 150–400)
PLATELETS: 231 10*3/uL (ref 150–400)
PLATELETS: 185 10*3/uL (ref 150–400)
Platelets: 166 10*3/uL (ref 150–400)
RBC: 4.24 MIL/uL (ref 4.22–5.81)
RBC: 4.61 MIL/uL (ref 4.22–5.81)
RBC: 4.76 MIL/uL (ref 4.22–5.81)
RBC: 4.89 MIL/uL (ref 4.22–5.81)
RDW: 15 % (ref 11.5–15.5)
RDW: 14.9 % (ref 11.5–15.5)
RDW: 15.1 % (ref 11.5–15.5)
RDW: 15.3 % (ref 11.5–15.5)
WBC: 10.3 10*3/uL (ref 4.0–10.5)
WBC: 10.7 10*3/uL — ABNORMAL HIGH (ref 4.0–10.5)
WBC: 4.4 10*3/uL (ref 4.0–10.5)
WBC: 4.7 10*3/uL (ref 4.0–10.5)

## 2015-01-20 LAB — PLATELET COUNT: PLATELETS: 163 10*3/uL (ref 150–400)

## 2015-01-20 LAB — POCT I-STAT 3, ART BLOOD GAS (G3+)
ACID-BASE DEFICIT: 4 mmol/L — AB (ref 0.0–2.0)
Acid-base deficit: 5 mmol/L — ABNORMAL HIGH (ref 0.0–2.0)
Acid-base deficit: 2 mmol/L (ref 0.0–2.0)
Bicarbonate: 25.6 mEq/L — ABNORMAL HIGH (ref 20.0–24.0)
Bicarbonate: 22.3 mEq/L (ref 20.0–24.0)
Bicarbonate: 21.4 mEq/L (ref 20.0–24.0)
Bicarbonate: 23.2 mEq/L (ref 20.0–24.0)
O2 SAT: 100 %
O2 SAT: 96 %
O2 Saturation: 96 %
O2 Saturation: 96 %
PCO2 ART: 46.4 mmHg — AB (ref 35.0–45.0)
PCO2 ART: 43.7 mmHg (ref 35.0–45.0)
PCO2 ART: 43.4 mmHg (ref 35.0–45.0)
PH ART: 7.305 — AB (ref 7.350–7.450)
PH ART: 7.341 — AB (ref 7.350–7.450)
PO2 ART: 91 mmHg (ref 80.0–100.0)
Patient temperature: 37.3
Patient temperature: 38.2
TCO2: 27 mmol/L (ref 0–100)
TCO2: 24 mmol/L (ref 0–100)
TCO2: 23 mmol/L (ref 0–100)
TCO2: 24 mmol/L (ref 0–100)
pCO2 arterial: 43.5 mmHg (ref 35.0–45.0)
pH, Arterial: 7.35 (ref 7.350–7.450)
pH, Arterial: 7.317 — ABNORMAL LOW (ref 7.350–7.450)
pO2, Arterial: 259 mmHg — ABNORMAL HIGH (ref 80.0–100.0)
pO2, Arterial: 89 mmHg (ref 80.0–100.0)
pO2, Arterial: 92 mmHg (ref 80.0–100.0)

## 2015-01-20 LAB — APTT: aPTT: 35 seconds (ref 24–37)

## 2015-01-20 LAB — GLUCOSE, CAPILLARY
GLUCOSE-CAPILLARY: 120 mg/dL — AB (ref 65–99)
GLUCOSE-CAPILLARY: 126 mg/dL — AB (ref 65–99)
GLUCOSE-CAPILLARY: 121 mg/dL — AB (ref 65–99)
Glucose-Capillary: 121 mg/dL — ABNORMAL HIGH (ref 65–99)
Glucose-Capillary: 113 mg/dL — ABNORMAL HIGH (ref 65–99)
Glucose-Capillary: 115 mg/dL — ABNORMAL HIGH (ref 65–99)
Glucose-Capillary: 117 mg/dL — ABNORMAL HIGH (ref 65–99)

## 2015-01-20 LAB — POCT I-STAT 4, (NA,K, GLUC, HGB,HCT)
Glucose, Bld: 112 mg/dL — ABNORMAL HIGH (ref 65–99)
HEMATOCRIT: 40 % (ref 39.0–52.0)
HEMOGLOBIN: 13.6 g/dL (ref 13.0–17.0)
Potassium: 4.1 mmol/L (ref 3.5–5.1)
Sodium: 136 mmol/L (ref 135–145)

## 2015-01-20 LAB — HEMOGLOBIN AND HEMATOCRIT, BLOOD
HCT: 32.5 % — ABNORMAL LOW (ref 39.0–52.0)
Hemoglobin: 11.1 g/dL — ABNORMAL LOW (ref 13.0–17.0)

## 2015-01-20 LAB — CREATININE, SERUM
Creatinine, Ser: 1.49 mg/dL — ABNORMAL HIGH (ref 0.61–1.24)
GFR, EST NON AFRICAN AMERICAN: 55 mL/min — AB (ref >60)

## 2015-01-20 LAB — HEMOGLOBIN A1C
Hgb A1c MFr Bld: 6.2 % — ABNORMAL HIGH (ref 4.8–5.6)
Mean Plasma Glucose: 131 mg/dL

## 2015-01-20 LAB — POCT I-STAT GLUCOSE
Glucose, Bld: 98 mg/dL (ref 65–99)
OPERATOR ID: 295891

## 2015-01-20 LAB — PROTIME-INR
INR: 1.17 (ref 0.00–1.49)
PROTHROMBIN TIME: 15 s (ref 11.6–15.2)

## 2015-01-20 LAB — HEPARIN LEVEL (UNFRACTIONATED): Heparin Unfractionated: 0.62 IU/mL (ref 0.30–0.70)

## 2015-01-20 LAB — MAGNESIUM: Magnesium: 2.3 mg/dL (ref 1.7–2.4)

## 2015-01-20 SURGERY — CORONARY ARTERY BYPASS GRAFTING (CABG)
Anesthesia: General | Site: Chest

## 2015-01-20 MED ORDER — FENTANYL CITRATE (PF) 250 MCG/5ML IJ SOLN
INTRAMUSCULAR | Status: AC
  Filled 2015-01-20: qty 5

## 2015-01-20 MED ORDER — METOPROLOL TARTRATE 12.5 MG HALF TABLET
12.5000 mg | ORAL_TABLET | Freq: Two times a day (BID) | ORAL | Status: DC
  Filled 2015-01-20: qty 1

## 2015-01-20 MED ORDER — ALBUMIN HUMAN 5 % IV SOLN
250.0000 mL | INTRAVENOUS | Status: AC | PRN
  Administered 2015-01-20 (×2): 250 mL via INTRAVENOUS
  Filled 2015-01-20: qty 250

## 2015-01-20 MED ORDER — STERILE WATER FOR INJECTION IJ SOLN
INTRAMUSCULAR | Status: AC
  Filled 2015-01-20: qty 10

## 2015-01-20 MED ORDER — PROPOFOL 10 MG/ML IV BOLUS
INTRAVENOUS | Status: DC | PRN
  Administered 2015-01-20: 50 mg via INTRAVENOUS

## 2015-01-20 MED ORDER — MIDAZOLAM HCL 2 MG/2ML IJ SOLN: 2.0000 mg | INTRAMUSCULAR | Status: DC | PRN

## 2015-01-20 MED ORDER — TRAMADOL HCL 50 MG PO TABS: 50.0000 mg | ORAL_TABLET | ORAL | Status: DC | PRN

## 2015-01-20 MED ORDER — STERILE WATER FOR IRRIGATION IR SOLN
Status: DC | PRN
  Administered 2015-01-20: 1000 mL

## 2015-01-20 MED ORDER — LACTATED RINGERS IV SOLN
INTRAVENOUS | Status: DC | PRN
  Administered 2015-01-20 (×2): via INTRAVENOUS

## 2015-01-20 MED ORDER — ROCURONIUM BROMIDE 50 MG/5ML IV SOLN
INTRAVENOUS | Status: AC
  Filled 2015-01-20: qty 1

## 2015-01-20 MED ORDER — SODIUM CHLORIDE 0.45 % IV SOLN
INTRAVENOUS | Status: DC | PRN
  Administered 2015-01-20: 20 mL/h via INTRAVENOUS

## 2015-01-20 MED ORDER — ONDANSETRON HCL 4 MG/2ML IJ SOLN
4.0000 mg | Freq: Four times a day (QID) | INTRAMUSCULAR | Status: DC | PRN
  Filled 2015-01-20: qty 2

## 2015-01-20 MED ORDER — MAGNESIUM SULFATE 4 GM/100ML IV SOLN
4.0000 g | Freq: Once | INTRAVENOUS | Status: AC
  Administered 2015-01-20: 4 g via INTRAVENOUS
  Filled 2015-01-20: qty 100

## 2015-01-20 MED ORDER — PANTOPRAZOLE SODIUM 40 MG PO TBEC: 40.0000 mg | DELAYED_RELEASE_TABLET | Freq: Every day | ORAL | Status: DC

## 2015-01-20 MED ORDER — BISACODYL 10 MG RE SUPP: 10.0000 mg | Freq: Every day | RECTAL | Status: DC

## 2015-01-20 MED ORDER — MILRINONE IN DEXTROSE 20 MG/100ML IV SOLN
0.3750 ug/kg/min | INTRAVENOUS | Status: DC
  Filled 2015-01-20: qty 100

## 2015-01-20 MED ORDER — PROTAMINE SULFATE 10 MG/ML IV SOLN
INTRAVENOUS | Status: AC
  Filled 2015-01-20: qty 10

## 2015-01-20 MED ORDER — CHLORHEXIDINE GLUCONATE 0.12% ORAL RINSE (MEDLINE KIT)
15.0000 mL | Freq: Two times a day (BID) | OROMUCOSAL | Status: DC
  Administered 2015-01-20 – 2015-01-21 (×2): 15 mL via OROMUCOSAL

## 2015-01-20 MED ORDER — MORPHINE SULFATE (PF) 2 MG/ML IV SOLN
2.0000 mg | INTRAVENOUS | Status: DC | PRN
  Administered 2015-01-20: 2 mg via INTRAVENOUS
  Administered 2015-01-21: 4 mg via INTRAVENOUS
  Administered 2015-01-21: 2 mg via INTRAVENOUS
  Administered 2015-01-21 (×4): 4 mg via INTRAVENOUS
  Administered 2015-01-21 (×2): 2 mg via INTRAVENOUS
  Administered 2015-01-21: 4 mg via INTRAVENOUS
  Administered 2015-01-22: 2 mg via INTRAVENOUS
  Filled 2015-01-20: qty 1
  Filled 2015-01-20 (×3): qty 2
  Filled 2015-01-20 (×2): qty 1
  Filled 2015-01-20 (×2): qty 2
  Filled 2015-01-20: qty 1
  Filled 2015-01-20: qty 2
  Filled 2015-01-20 (×2): qty 1

## 2015-01-20 MED ORDER — INSULIN REGULAR BOLUS VIA INFUSION
0.0000 [IU] | Freq: Three times a day (TID) | INTRAVENOUS | Status: DC
  Filled 2015-01-20: qty 10

## 2015-01-20 MED ORDER — LACTATED RINGERS IV SOLN
INTRAVENOUS | Status: DC | PRN
  Administered 2015-01-20: 08:00:00 via INTRAVENOUS

## 2015-01-20 MED ORDER — LIDOCAINE HCL (CARDIAC) 20 MG/ML IV SOLN
INTRAVENOUS | Status: AC
  Filled 2015-01-20: qty 5

## 2015-01-20 MED ORDER — ACETAMINOPHEN 160 MG/5ML PO SOLN: 650.0000 mg | Freq: Once | ORAL | Status: AC

## 2015-01-20 MED ORDER — PROPOFOL 10 MG/ML IV BOLUS
INTRAVENOUS | Status: AC
  Filled 2015-01-20: qty 20

## 2015-01-20 MED ORDER — HEPARIN SODIUM (PORCINE) 1000 UNIT/ML IJ SOLN
INTRAMUSCULAR | Status: DC | PRN
  Administered 2015-01-20: 2000 [IU] via INTRAVENOUS
  Administered 2015-01-20: 43000 [IU] via INTRAVENOUS

## 2015-01-20 MED ORDER — MIDAZOLAM HCL 10 MG/2ML IJ SOLN
INTRAMUSCULAR | Status: AC
  Filled 2015-01-20: qty 4

## 2015-01-20 MED ORDER — SODIUM CHLORIDE 0.9 % IJ SOLN
OROMUCOSAL | Status: DC | PRN
  Administered 2015-01-20 (×3): 4 mL via TOPICAL

## 2015-01-20 MED ORDER — PROTAMINE SULFATE 10 MG/ML IV SOLN
INTRAVENOUS | Status: AC
  Filled 2015-01-20: qty 25

## 2015-01-20 MED ORDER — ASPIRIN EC 325 MG PO TBEC
325.0000 mg | DELAYED_RELEASE_TABLET | Freq: Every day | ORAL | Status: DC
  Administered 2015-01-21 – 2015-01-25 (×4): 325 mg via ORAL
  Filled 2015-01-20 (×4): qty 1

## 2015-01-20 MED ORDER — VANCOMYCIN HCL IN DEXTROSE 1-5 GM/200ML-% IV SOLN
1000.0000 mg | Freq: Once | INTRAVENOUS | Status: AC
  Administered 2015-01-20: 1000 mg via INTRAVENOUS
  Filled 2015-01-20: qty 200

## 2015-01-20 MED ORDER — PANTOPRAZOLE SODIUM 40 MG PO TBEC
40.0000 mg | DELAYED_RELEASE_TABLET | Freq: Every day | ORAL | Status: DC
  Administered 2015-01-21 – 2015-01-25 (×5): 40 mg via ORAL
  Filled 2015-01-20 (×5): qty 1

## 2015-01-20 MED ORDER — EPHEDRINE SULFATE 50 MG/ML IJ SOLN
INTRAMUSCULAR | Status: AC
  Filled 2015-01-20: qty 1

## 2015-01-20 MED ORDER — METOPROLOL TARTRATE 25 MG/10 ML ORAL SUSPENSION
12.5000 mg | Freq: Two times a day (BID) | ORAL | Status: DC
  Filled 2015-01-20: qty 5

## 2015-01-20 MED ORDER — 0.9 % SODIUM CHLORIDE (POUR BTL) OPTIME
TOPICAL | Status: DC | PRN
  Administered 2015-01-20: 1000 mL
  Administered 2015-01-20: 5000 mL

## 2015-01-20 MED ORDER — MIDAZOLAM HCL 5 MG/5ML IJ SOLN
INTRAMUSCULAR | Status: DC | PRN
  Administered 2015-01-20: 2 mg via INTRAVENOUS
  Administered 2015-01-20: 6 mg via INTRAVENOUS
  Administered 2015-01-20: 2 mg via INTRAVENOUS
  Administered 2015-01-20: 5 mg via INTRAVENOUS

## 2015-01-20 MED ORDER — PROTAMINE SULFATE 10 MG/ML IV SOLN
INTRAVENOUS | Status: DC | PRN
  Administered 2015-01-20: 20 mg via INTRAVENOUS
  Administered 2015-01-20: 40 mg via INTRAVENOUS
  Administered 2015-01-20: 20 mg via INTRAVENOUS
  Administered 2015-01-20: 50 mg via INTRAVENOUS
  Administered 2015-01-20: 20 mg via INTRAVENOUS
  Administered 2015-01-20 (×2): 50 mg via INTRAVENOUS
  Administered 2015-01-20: 20 mg via INTRAVENOUS
  Administered 2015-01-20: 30 mg via INTRAVENOUS
  Administered 2015-01-20: 50 mg via INTRAVENOUS

## 2015-01-20 MED ORDER — PHENYLEPHRINE HCL 10 MG/ML IJ SOLN
20.0000 mg | INTRAMUSCULAR | Status: DC | PRN
  Administered 2015-01-20: 20 ug/min via INTRAVENOUS

## 2015-01-20 MED ORDER — SODIUM CHLORIDE 0.9 % IV SOLN
INTRAVENOUS | Status: DC | PRN
  Administered 2015-01-20: 15:00:00 via INTRAVENOUS

## 2015-01-20 MED ORDER — SODIUM CHLORIDE 0.9 % IJ SOLN
3.0000 mL | Freq: Two times a day (BID) | INTRAMUSCULAR | Status: DC
  Administered 2015-01-21 – 2015-01-22 (×3): 3 mL via INTRAVENOUS

## 2015-01-20 MED ORDER — HEPARIN SODIUM (PORCINE) 1000 UNIT/ML IJ SOLN
INTRAMUSCULAR | Status: AC
  Filled 2015-01-20: qty 2

## 2015-01-20 MED ORDER — NITROGLYCERIN IN D5W 200-5 MCG/ML-% IV SOLN
0.0000 ug/min | INTRAVENOUS | Status: DC
Start: 2015-01-20 — End: 2015-01-22
  Administered 2015-01-21: 90 ug/min via INTRAVENOUS
  Administered 2015-01-22: 50 ug/min via INTRAVENOUS
  Filled 2015-01-20 (×2): qty 250

## 2015-01-20 MED ORDER — BISACODYL 5 MG PO TBEC
10.0000 mg | DELAYED_RELEASE_TABLET | Freq: Every day | ORAL | Status: DC
  Administered 2015-01-21 – 2015-01-25 (×5): 10 mg via ORAL
  Filled 2015-01-20 (×5): qty 2

## 2015-01-20 MED ORDER — PHENYLEPHRINE 40 MCG/ML (10ML) SYRINGE FOR IV PUSH (FOR BLOOD PRESSURE SUPPORT)
PREFILLED_SYRINGE | INTRAVENOUS | Status: AC
  Filled 2015-01-20: qty 10

## 2015-01-20 MED ORDER — ACETAMINOPHEN 650 MG RE SUPP
650.0000 mg | Freq: Once | RECTAL | Status: AC
  Administered 2015-01-20: 650 mg via RECTAL

## 2015-01-20 MED ORDER — POTASSIUM CHLORIDE 10 MEQ/50ML IV SOLN: 10.0000 meq | INTRAVENOUS | Status: AC

## 2015-01-20 MED ORDER — ASPIRIN 81 MG PO CHEW
324.0000 mg | CHEWABLE_TABLET | Freq: Every day | ORAL | Status: DC
  Administered 2015-01-23: 324 mg
  Filled 2015-01-20: qty 4

## 2015-01-20 MED ORDER — ARTIFICIAL TEARS OP OINT
TOPICAL_OINTMENT | OPHTHALMIC | Status: AC
  Filled 2015-01-20: qty 3.5

## 2015-01-20 MED ORDER — ACETAMINOPHEN 160 MG/5ML PO SOLN: 1000.0000 mg | Freq: Four times a day (QID) | ORAL | Status: DC

## 2015-01-20 MED ORDER — PHENYLEPHRINE HCL 10 MG/ML IJ SOLN
10.0000 mg | INTRAVENOUS | Status: DC | PRN
  Administered 2015-01-20: 20 ug/min via INTRAVENOUS

## 2015-01-20 MED ORDER — FENTANYL CITRATE (PF) 100 MCG/2ML IJ SOLN
INTRAMUSCULAR | Status: DC | PRN
  Administered 2015-01-20: 50 ug via INTRAVENOUS
  Administered 2015-01-20 (×2): 100 ug via INTRAVENOUS
  Administered 2015-01-20: 250 ug via INTRAVENOUS
  Administered 2015-01-20: 50 ug via INTRAVENOUS
  Administered 2015-01-20: 250 ug via INTRAVENOUS
  Administered 2015-01-20: 150 ug via INTRAVENOUS
  Administered 2015-01-20: 250 ug via INTRAVENOUS
  Administered 2015-01-20: 300 ug via INTRAVENOUS

## 2015-01-20 MED ORDER — HEMOSTATIC AGENTS (NO CHARGE) OPTIME
TOPICAL | Status: DC | PRN
  Administered 2015-01-20: 1 via TOPICAL

## 2015-01-20 MED ORDER — LACTATED RINGERS IV SOLN: INTRAVENOUS | Status: DC

## 2015-01-20 MED ORDER — ROCURONIUM BROMIDE 50 MG/5ML IV SOLN
INTRAVENOUS | Status: AC
  Filled 2015-01-20: qty 4

## 2015-01-20 MED ORDER — ROCURONIUM BROMIDE 100 MG/10ML IV SOLN
INTRAVENOUS | Status: DC | PRN
  Administered 2015-01-20 (×5): 50 mg via INTRAVENOUS
  Administered 2015-01-20: 100 mg via INTRAVENOUS

## 2015-01-20 MED ORDER — ANTISEPTIC ORAL RINSE SOLUTION (CORINZ)
7.0000 mL | Freq: Four times a day (QID) | OROMUCOSAL | Status: DC
  Administered 2015-01-20 – 2015-01-21 (×3): 7 mL via OROMUCOSAL

## 2015-01-20 MED ORDER — FAMOTIDINE IN NACL 20-0.9 MG/50ML-% IV SOLN
20.0000 mg | Freq: Two times a day (BID) | INTRAVENOUS | Status: AC
  Administered 2015-01-20: 20 mg via INTRAVENOUS
  Filled 2015-01-20: qty 50

## 2015-01-20 MED ORDER — SODIUM CHLORIDE 0.9 % IV SOLN: 250.0000 mL | INTRAVENOUS | Status: DC

## 2015-01-20 MED ORDER — DEXTROSE 5 % IV SOLN
1.5000 g | Freq: Two times a day (BID) | INTRAVENOUS | Status: AC
  Administered 2015-01-20 – 2015-01-22 (×4): 1.5 g via INTRAVENOUS
  Filled 2015-01-20 (×4): qty 1.5

## 2015-01-20 MED ORDER — DOCUSATE SODIUM 100 MG PO CAPS
200.0000 mg | ORAL_CAPSULE | Freq: Every day | ORAL | Status: DC
  Administered 2015-01-21 – 2015-01-25 (×5): 200 mg via ORAL
  Filled 2015-01-20 (×5): qty 2

## 2015-01-20 MED ORDER — ARTIFICIAL TEARS OP OINT
TOPICAL_OINTMENT | OPHTHALMIC | Status: DC | PRN
  Administered 2015-01-20: 1 via OPHTHALMIC

## 2015-01-20 MED ORDER — HEPARIN SODIUM (PORCINE) 1000 UNIT/ML IJ SOLN
INTRAMUSCULAR | Status: AC
  Filled 2015-01-20: qty 1

## 2015-01-20 MED ORDER — SUCCINYLCHOLINE CHLORIDE 20 MG/ML IJ SOLN
INTRAMUSCULAR | Status: AC
  Filled 2015-01-20: qty 1

## 2015-01-20 MED ORDER — MORPHINE SULFATE (PF) 2 MG/ML IV SOLN
1.0000 mg | INTRAVENOUS | Status: AC | PRN
  Administered 2015-01-20 (×2): 2 mg via INTRAVENOUS
  Filled 2015-01-20: qty 1
  Filled 2015-01-20: qty 2
  Filled 2015-01-20: qty 1

## 2015-01-20 MED ORDER — LACTATED RINGERS IV SOLN: 500.0000 mL | Freq: Once | INTRAVENOUS | Status: DC | PRN

## 2015-01-20 MED ORDER — OXYCODONE HCL 5 MG PO TABS
5.0000 mg | ORAL_TABLET | ORAL | Status: DC | PRN
  Administered 2015-01-21: 10 mg via ORAL
  Administered 2015-01-21: 5 mg via ORAL
  Administered 2015-01-21 (×4): 10 mg via ORAL
  Administered 2015-01-22: 5 mg via ORAL
  Administered 2015-01-22 (×2): 10 mg via ORAL
  Filled 2015-01-20 (×5): qty 2
  Filled 2015-01-20: qty 1
  Filled 2015-01-20 (×3): qty 2

## 2015-01-20 MED ORDER — METOPROLOL TARTRATE 1 MG/ML IV SOLN
2.5000 mg | INTRAVENOUS | Status: DC | PRN
  Administered 2015-01-21: 2.5 mg via INTRAVENOUS
  Administered 2015-01-21 (×2): 5 mg via INTRAVENOUS
  Administered 2015-01-21: 2.5 mg via INTRAVENOUS
  Administered 2015-01-22: 5 mg via INTRAVENOUS
  Filled 2015-01-20 (×3): qty 5

## 2015-01-20 MED ORDER — PHENYLEPHRINE HCL 10 MG/ML IJ SOLN
0.0000 ug/min | INTRAVENOUS | Status: DC
  Filled 2015-01-20 (×2): qty 2

## 2015-01-20 MED ORDER — SODIUM CHLORIDE 0.9 % IV SOLN
INTRAVENOUS | Status: DC
  Filled 2015-01-20: qty 2.5

## 2015-01-20 MED ORDER — LEVALBUTEROL HCL 0.63 MG/3ML IN NEBU
0.6300 mg | INHALATION_SOLUTION | Freq: Four times a day (QID) | RESPIRATORY_TRACT | Status: DC
  Administered 2015-01-20 – 2015-01-21 (×3): 0.63 mg via RESPIRATORY_TRACT
  Filled 2015-01-20 (×7): qty 3

## 2015-01-20 MED ORDER — LACTATED RINGERS IV SOLN
INTRAVENOUS | Status: DC | PRN
  Administered 2015-01-20 (×2): via INTRAVENOUS

## 2015-01-20 MED ORDER — CALCIUM CHLORIDE 10 % IV SOLN
INTRAVENOUS | Status: DC | PRN
  Administered 2015-01-20 (×2): 250 mg via INTRAVENOUS

## 2015-01-20 MED ORDER — SODIUM CHLORIDE 0.9 % IV SOLN: INTRAVENOUS | Status: DC

## 2015-01-20 MED ORDER — INSULIN ASPART 100 UNIT/ML ~~LOC~~ SOLN
0.0000 [IU] | SUBCUTANEOUS | Status: DC
  Administered 2015-01-20 – 2015-01-21 (×3): 2 [IU] via SUBCUTANEOUS

## 2015-01-20 MED ORDER — SODIUM CHLORIDE 0.9 % IV SOLN
1.0000 g/h | INTRAVENOUS | Status: DC
  Filled 2015-01-20: qty 20

## 2015-01-20 MED ORDER — DEXMEDETOMIDINE HCL IN NACL 200 MCG/50ML IV SOLN: 0.0000 ug/kg/h | INTRAVENOUS | Status: DC

## 2015-01-20 MED ORDER — ACETAMINOPHEN 500 MG PO TABS
1000.0000 mg | ORAL_TABLET | Freq: Four times a day (QID) | ORAL | Status: DC
  Administered 2015-01-20 – 2015-01-25 (×16): 1000 mg via ORAL
  Filled 2015-01-20 (×17): qty 2

## 2015-01-20 MED ORDER — DEXMEDETOMIDINE HCL IN NACL 200 MCG/50ML IV SOLN
INTRAVENOUS | Status: AC
  Filled 2015-01-20: qty 50

## 2015-01-20 MED ORDER — SODIUM CHLORIDE 0.9 % IJ SOLN: 3.0000 mL | INTRAMUSCULAR | Status: DC | PRN

## 2015-01-20 SURGICAL SUPPLY — 107 items
BAG DECANTER FOR FLEXI CONT (MISCELLANEOUS) ×4 IMPLANT
BANDAGE ELASTIC 4 VELCRO ST LF (GAUZE/BANDAGES/DRESSINGS) ×4 IMPLANT
BANDAGE ELASTIC 6 VELCRO ST LF (GAUZE/BANDAGES/DRESSINGS) ×4 IMPLANT
BASKET HEART  (ORDER IN 25'S) (MISCELLANEOUS) ×1
BASKET HEART (ORDER IN 25'S) (MISCELLANEOUS) ×1
BASKET HEART (ORDER IN 25S) (MISCELLANEOUS) ×2 IMPLANT
BENZOIN TINCTURE PRP APPL 2/3 (GAUZE/BANDAGES/DRESSINGS) ×4 IMPLANT
BLADE 11 SAFETY STRL DISP (BLADE) ×4 IMPLANT
BLADE STERNUM SYSTEM 6 (BLADE) ×4 IMPLANT
BNDG GAUZE ELAST 4 BULKY (GAUZE/BANDAGES/DRESSINGS) ×4 IMPLANT
CANISTER SUCTION 2500CC (MISCELLANEOUS) ×4 IMPLANT
CANNULA EZ GLIDE AORTIC 21FR (CANNULA) ×4 IMPLANT
CATH CPB KIT HENDRICKSON (MISCELLANEOUS) ×4 IMPLANT
CATH IV 16 GAUGE 2 1/4 (CATHETERS) ×4 IMPLANT
CATH ROBINSON RED A/P 18FR (CATHETERS) ×4 IMPLANT
CATH THORACIC 36FR (CATHETERS) ×4 IMPLANT
CATH THORACIC 36FR RT ANG (CATHETERS) ×8 IMPLANT
CLIP TI MEDIUM 24 (CLIP) IMPLANT
CLIP TI WIDE RED SMALL 24 (CLIP) ×44 IMPLANT
CLOSURE WOUND 1/2 X4 (GAUZE/BANDAGES/DRESSINGS) ×1
CONN Y 3/8X3/8X3/8  BEN (MISCELLANEOUS) ×2
CONN Y 3/8X3/8X3/8 BEN (MISCELLANEOUS) ×2 IMPLANT
CRADLE DONUT ADULT HEAD (MISCELLANEOUS) ×4 IMPLANT
DRAPE CARDIOVASCULAR INCISE (DRAPES) ×2
DRAPE SLUSH/WARMER DISC (DRAPES) ×4 IMPLANT
DRAPE SRG 135X102X78XABS (DRAPES) ×2 IMPLANT
DRSG COVADERM 4X14 (GAUZE/BANDAGES/DRESSINGS) ×4 IMPLANT
DRSG KUZMA FLUFF (GAUZE/BANDAGES/DRESSINGS) ×4 IMPLANT
ELECT REM PT RETURN 9FT ADLT (ELECTROSURGICAL) ×8
ELECTRODE REM PT RTRN 9FT ADLT (ELECTROSURGICAL) ×4 IMPLANT
GAUZE SPONGE 4X4 12PLY STRL (GAUZE/BANDAGES/DRESSINGS) ×8 IMPLANT
GLOVE BIO SURGEON STRL SZ 6 (GLOVE) ×20 IMPLANT
GLOVE BIO SURGEON STRL SZ 6.5 (GLOVE) ×3 IMPLANT
GLOVE BIO SURGEONS STRL SZ 6.5 (GLOVE) ×1
GLOVE SURG SIGNA 7.5 PF LTX (GLOVE) ×16 IMPLANT
GOWN STRL REUS W/ TWL LRG LVL3 (GOWN DISPOSABLE) ×8 IMPLANT
GOWN STRL REUS W/ TWL XL LVL3 (GOWN DISPOSABLE) ×4 IMPLANT
GOWN STRL REUS W/TWL LRG LVL3 (GOWN DISPOSABLE) ×8
GOWN STRL REUS W/TWL XL LVL3 (GOWN DISPOSABLE) ×4
HEMOSTAT POWDER SURGIFOAM 1G (HEMOSTASIS) ×12 IMPLANT
HEMOSTAT SURGICEL 2X14 (HEMOSTASIS) ×4 IMPLANT
INSERT FOGARTY XLG (MISCELLANEOUS) IMPLANT
IV CATH 18G X1.75 CATHLON (IV SOLUTION) ×4 IMPLANT
KIT BASIN OR (CUSTOM PROCEDURE TRAY) ×4 IMPLANT
KIT ROOM TURNOVER OR (KITS) ×4 IMPLANT
KIT SUCTION CATH 14FR (SUCTIONS) ×8 IMPLANT
KIT VASOVIEW W/TROCAR VH 2000 (KITS) ×4 IMPLANT
MARKER GRAFT CORONARY BYPASS (MISCELLANEOUS) ×12 IMPLANT
NS IRRIG 1000ML POUR BTL (IV SOLUTION) ×24 IMPLANT
PACK OPEN HEART (CUSTOM PROCEDURE TRAY) ×4 IMPLANT
PAD ARMBOARD 7.5X6 YLW CONV (MISCELLANEOUS) ×8 IMPLANT
PAD ELECT DEFIB RADIOL ZOLL (MISCELLANEOUS) ×4 IMPLANT
PENCIL BUTTON HOLSTER BLD 10FT (ELECTRODE) ×4 IMPLANT
PUNCH AORTIC ROTATE  4.5MM 8IN (MISCELLANEOUS) ×4 IMPLANT
PUNCH AORTIC ROTATE 4.0MM (MISCELLANEOUS) IMPLANT
PUNCH AORTIC ROTATE 4.5MM 8IN (MISCELLANEOUS) IMPLANT
PUNCH AORTIC ROTATE 5MM 8IN (MISCELLANEOUS) IMPLANT
SET CARDIOPLEGIA MPS 5001102 (MISCELLANEOUS) ×4 IMPLANT
SPONGE GAUZE 4X4 12PLY STER LF (GAUZE/BANDAGES/DRESSINGS) ×8 IMPLANT
SPONGE LAP 18X18 X RAY DECT (DISPOSABLE) ×4 IMPLANT
SPONGE LAP 4X18 X RAY DECT (DISPOSABLE) ×12 IMPLANT
STRIP CLOSURE SKIN 1/2X4 (GAUZE/BANDAGES/DRESSINGS) ×3 IMPLANT
SUT BONE WAX W31G (SUTURE) ×4 IMPLANT
SUT ETHIBOND 2 0 SH (SUTURE) ×6
SUT ETHIBOND 2 0 SH 36X2 (SUTURE) ×6 IMPLANT
SUT MNCRL AB 4-0 PS2 18 (SUTURE) ×4 IMPLANT
SUT PROLENE 3 0 SH DA (SUTURE) ×4 IMPLANT
SUT PROLENE 4 0 RB 1 (SUTURE) ×4
SUT PROLENE 4 0 SH DA (SUTURE) IMPLANT
SUT PROLENE 4-0 RB1 .5 CRCL 36 (SUTURE) ×4 IMPLANT
SUT PROLENE 5 0 C 1 36 (SUTURE) ×8 IMPLANT
SUT PROLENE 6 0 C 1 30 (SUTURE) ×16 IMPLANT
SUT PROLENE 7 0 BV1 MDA (SUTURE) ×4 IMPLANT
SUT PROLENE 8 0 BV175 6 (SUTURE) ×36 IMPLANT
SUT SILK  1 MH (SUTURE) ×8
SUT SILK 1 MH (SUTURE) ×8 IMPLANT
SUT SILK 1 TIES 10X30 (SUTURE) ×4 IMPLANT
SUT SILK 2 0 SH CR/8 (SUTURE) ×8 IMPLANT
SUT SILK 2 0 TIES 10X30 (SUTURE) ×4 IMPLANT
SUT SILK 2 0 TIES 17X18 (SUTURE) ×2
SUT SILK 2-0 18XBRD TIE BLK (SUTURE) ×2 IMPLANT
SUT SILK 3 0 SH CR/8 (SUTURE) ×4 IMPLANT
SUT SILK 4 0 TIE 10X30 (SUTURE) ×8 IMPLANT
SUT STEEL 6MS V (SUTURE) ×4 IMPLANT
SUT STEEL STERNAL CCS#1 18IN (SUTURE) IMPLANT
SUT STEEL SZ 6 DBL 3X14 BALL (SUTURE) ×4 IMPLANT
SUT TEM PAC WIRE 2 0 SH (SUTURE) ×16 IMPLANT
SUT VIC AB 1 CTX 36 (SUTURE) ×4
SUT VIC AB 1 CTX36XBRD ANBCTR (SUTURE) ×4 IMPLANT
SUT VIC AB 2-0 CT1 27 (SUTURE) ×2
SUT VIC AB 2-0 CT1 TAPERPNT 27 (SUTURE) ×2 IMPLANT
SUT VIC AB 2-0 CTX 27 (SUTURE) ×8 IMPLANT
SUT VIC AB 3-0 SH 27 (SUTURE)
SUT VIC AB 3-0 SH 27X BRD (SUTURE) IMPLANT
SUT VIC AB 3-0 X1 27 (SUTURE) ×8 IMPLANT
SUT VICRYL 4-0 PS2 18IN ABS (SUTURE) IMPLANT
SUTURE E-PAK OPEN HEART (SUTURE) ×4 IMPLANT
SYSTEM SAHARA CHEST DRAIN ATS (WOUND CARE) ×4 IMPLANT
TAPE CLOTH SURG 4X10 WHT LF (GAUZE/BANDAGES/DRESSINGS) ×8 IMPLANT
TAPE PAPER 2X10 WHT MICROPORE (GAUZE/BANDAGES/DRESSINGS) ×4 IMPLANT
TOWEL OR 17X24 6PK STRL BLUE (TOWEL DISPOSABLE) ×8 IMPLANT
TOWEL OR 17X26 10 PK STRL BLUE (TOWEL DISPOSABLE) ×8 IMPLANT
TRAY FOLEY IC TEMP SENS 16FR (CATHETERS) ×4 IMPLANT
TUBE FEEDING 8FR 16IN STR KANG (MISCELLANEOUS) ×4 IMPLANT
TUBING INSUFFLATION (TUBING) ×4 IMPLANT
UNDERPAD 30X30 INCONTINENT (UNDERPADS AND DIAPERS) ×4 IMPLANT
WATER STERILE IRR 1000ML POUR (IV SOLUTION) ×8 IMPLANT

## 2015-01-20 NOTE — Progress Notes (Signed)
EKG CRITICAL VALUE     12 lead EKG performed.  Critical value noted.Sula Soda RN notified.   Yehuda Mao, [place] 01/20/2015 5:07 PM

## 2015-01-20 NOTE — Progress Notes (Signed)
  Echocardiogram Echocardiogram Transesophageal has been performed.  Bobbye Charleston 01/20/2015, 9:37 AM

## 2015-01-20 NOTE — Brief Op Note (Addendum)
01/11/2015 - 01/20/2015  1:54 PM  PATIENT:  Scott Salinas  44 y.o. male  PRE-OPERATIVE DIAGNOSIS:  Coronary Artery Disease  POST-OPERATIVE DIAGNOSIS:  Coronary Artery Disease  PROCEDURE:   CORONARY ARTERY BYPASS GRAFTING x 4   LIMA-LAD  Free RIMA-OM (Y-graft off LIMA)  Sequential SVG-D2-D3 ENDOSCOPIC GREATER SAPHENOUS VEIN HARVEST LEFT THIGH  SURGEON:  [name], MD  ASSISTANT: Suzzanne Cloud, PA-C  ANESTHESIA:   general   EBL: See anesthesia and perfusion records  PATIENT CONDITION:  ICU - intubated and hemodynamically stable.  PRE-OPERATIVE WEIGHT: 113 kg   XC= 68 min CPB= 135 min. Off on 2nd attempt. 1st attempt aborted due to air in [place]. Roxan Hockey, MD [facility] [phone]

## 2015-01-20 NOTE — Procedures (Signed)
Extubation Procedure Note  Patient Details:   Name: Scott Salinas DOB: [birth year] MRN: [id number]   Airway Documentation:     Evaluation  O2 sats: stable throughout Complications: No apparent complications Patient did tolerate procedure well. Bilateral Breath Sounds: Clear, Diminished   Yes    Patient parameters NIF -40 and VC 961mL. Patient was extubated to a 4L [place].  Patient was able to perform IS of 569mL.   Carrington Clamp A 01/20/2015, 9:24 PM
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
## 2015-01-20 NOTE — Transfer of Care (Signed)
Immediate Anesthesia Transfer of Care Note  Patient: Scott Salinas  Procedure(s) Performed: Procedure(s) with comments: CORONARY ARTERY BYPASS GRAFTING (CABG) X 4 using bilateral internal mammary arteries and left saphenous leg vein harvested endoscopically. (N/A) - Bilateral Mammary TRANSESOPHAGEAL ECHOCARDIOGRAM (TEE) (N/A)  Patient Location: SICU  Anesthesia Type:General  Level of Consciousness: Patient remains intubated per anesthesia plan  Airway & Oxygen Therapy: Patient remains intubated per anesthesia plan and Patient placed on Ventilator (see vital sign flow sheet for setting)  Post-op Assessment: Report given to RN and Post -op Vital signs reviewed and stable  Post vital signs: Reviewed and stable  Last Vitals:  Filed Vitals:   01/20/15 0500  BP: 140/104  Pulse: 78  Temp: 36.6 C  Resp: 18    Complications: No apparent anesthesia complications

## 2015-01-20 NOTE — Anesthesia Preprocedure Evaluation (Addendum)
Anesthesia Evaluation  Patient identified by MRN, date of birth, ID band Patient awake    Reviewed: Allergy & Precautions, NPO status , Patient's Chart, lab work & pertinent test results  Airway Mallampati: III  TM Distance: >3 FB Neck ROM: Full    Dental  (+) Dental Advisory Given, Teeth Intact   Pulmonary Current Smoker,     + decreased breath sounds      Cardiovascular hypertension, Pt. on medications and Pt. on home beta blockers + angina with exertion + CAD, + Past MI and + Cardiac Stents  Normal cardiovascular exam Rhythm:Regular     Neuro/Psych    GI/Hepatic   Endo/Other  Morbid obesity  Renal/GU      Musculoskeletal   Abdominal   Peds  Hematology   Anesthesia Other Findings   Reproductive/Obstetrics                           Anesthesia Physical Anesthesia Plan  ASA: IV  Anesthesia Plan: General   Post-op Pain Management:    Induction: Intravenous  Airway Management Planned: Oral ETT  Additional Equipment: Arterial line, CVP, PA Cath, 3D TEE and Ultrasound Guidance Line Placement  Intra-op Plan:   Post-operative Plan: Post-operative intubation/ventilation  Informed Consent: I have reviewed the patients History and Physical, chart, labs and discussed the procedure including the risks, benefits and alternatives for the proposed anesthesia with the patient or authorized representative who has indicated his/her understanding and acceptance.   Dental advisory given  Plan Discussed with: CRNA, Anesthesiologist and Surgeon  Anesthesia Plan Comments:         Anesthesia Quick Evaluation

## 2015-01-20 NOTE — Anesthesia Procedure Notes (Addendum)
Anesthesia Procedure Note Risks, benefits, and alternatives discussed for central line placement. Patient agrees to procedure.  Patient positioned supine with mild trendelenburg.  Sterile prep and drape of right neck.  Full barrier precautions taken.  1% injectable lidocaine placed SQ for topical anesthesia.  Central line placed with ultrasound guidance.  Appropriate blood return from catheter.  No air.  Sutured into place.  Sterile dressing applied.  No complications. Patient tolerated the procedure well.   The PAC was checked, and floated into the pulmonary artery using real time pulmonary artery wave tracing.  Once in the pulmonary artery, the catheter was secured. The patient tolerated the procedure well. A chest xray was ordered for PACU.  Veatrice Kells, MD  Procedure Name: Intubation Date/Time: 01/20/2015 8:43 AM Performed by: Garrison Columbus T Pre-anesthesia Checklist: Patient identified, Emergency Drugs available, Suction available and Patient being monitored Patient Re-evaluated:Patient Re-evaluated prior to inductionOxygen Delivery Method: Circle system utilized Preoxygenation: Pre-oxygenation with 100% oxygen Intubation Type: IV induction Ventilation: Mask ventilation without difficulty, Oral airway inserted - appropriate to patient size and Two handed mask ventilation required Laryngoscope Size: Miller, 2, Glidescope and 4 Grade View: Grade I Tube type: Oral Tube size: 8.5 mm Number of attempts: 2 Airway Equipment and Method: Stylet,  Video-laryngoscopy and Oral airway Placement Confirmation: ETT inserted through vocal cords under direct vision,  positive ETCO2 and breath sounds checked- equal and bilateral Secured at: 23 cm Tube secured with: Tape Dental Injury: Teeth and Oropharynx as per pre-operative assessment  Difficulty Due To: Difficulty was unanticipated and Difficult Airway-  due to edematous airway Comments: DL x 1 by CRNA with Sabra Heck 2. Unable to identify airway anatomy with  [facility] 2 blade due to edematous airway. DL x 2 with glidescope 4. Grade 1 view achieved with glidescope 4. Atraumatic intubation.    Anesthesia Procedure Note Risks, benefits, and alternatives discussed for central line placement. Patient agrees to procedure.  Patient positioned supine with mild trendelenburg.  Sterile prep and drape of right neck.  Full barrier precautions taken.  1% injectable lidocaine placed SQ for topical anesthesia.  Central line placed with ultrasound guidance.  Appropriate blood return from catheter.  No air.  Sutured into place.  Sterile dressing applied.  No complications. Patient tolerated the procedure well.   The PAC was checked, and floated into the pulmonary artery using real time pulmonary artery wave tracing.  Once in the pulmonary artery, the catheter was secured. The patient tolerated the procedure well. A chest xray was ordered for PACU.  Veatrice Kells, MD

## 2015-01-20 NOTE — Progress Notes (Signed)
CT surgery p.m. Rounds  Postop multivessel CABG with bilateral IMA grafts Sedated on ventilator Postop chest x-ray satisfactory Hemodynamics stable

## 2015-01-20 NOTE — Anesthesia Postprocedure Evaluation (Signed)
  Anesthesia Post-op Note  Patient: Scott Salinas  Procedure(s) Performed: Procedure(s) with comments: CORONARY ARTERY BYPASS GRAFTING (CABG) X 4 using bilateral internal mammary arteries and left saphenous leg vein harvested endoscopically. (N/A) - Bilateral Mammary TRANSESOPHAGEAL ECHOCARDIOGRAM (TEE) (N/A)  Patient Location: ICU  Anesthesia Type:General  Level of Consciousness: sedated  Airway and Oxygen Therapy: Patient placed on Ventilator (see vital sign flow sheet for setting)  Post-op Pain: mild  Post-op Assessment: Post-op Vital signs reviewed, Patient's Cardiovascular Status Stable, Respiratory Function Stable and Patent Airway              Post-op Vital Signs: Reviewed and stable  Last Vitals:  Filed Vitals:   01/20/15 1700  BP: 90/60  Pulse: 80  Temp: 37.9 C  Resp: 14    Complications: No apparent anesthesia complications

## 2015-01-20 NOTE — Progress Notes (Signed)
Pt sent down to short stay with transport, report given. Belongings sent with pt's friend.

## 2015-01-20 NOTE — Interval H&P Note (Signed)
History and Physical Interval Note:  01/20/2015 8:12 AM  Scott Salinas  has presented today for surgery, with the diagnosis of CAD  The various methods of treatment have been discussed with the patient and family. After consideration of risks, benefits and other options for treatment, the patient has consented to  Procedure(s) with comments: CORONARY ARTERY BYPASS GRAFTING (CABG), bilateral mammary (N/A) - Bilateral Mammary TRANSESOPHAGEAL ECHOCARDIOGRAM (TEE) (N/A) as a surgical intervention .  The patient's history has been reviewed, patient examined, no change in status, stable for surgery.  I have reviewed the patient's chart and labs.  Questions were answered to the patient's satisfaction.     [name]

## 2015-01-20 NOTE — OR Nursing (Signed)
SICU notified of surgery progress at 1433, 1451, and 1509.

## 2015-01-21 ENCOUNTER — Inpatient Hospital Stay (HOSPITAL_COMMUNITY): Payer: Medicaid Other

## 2015-01-21 ENCOUNTER — Encounter (HOSPITAL_COMMUNITY): Payer: Self-pay | Admitting: Thoracic Surgery (Cardiothoracic Vascular Surgery)

## 2015-01-21 LAB — CBC
HCT: 34.6 % — ABNORMAL LOW (ref 39.0–52.0)
HCT: 33 % — ABNORMAL LOW (ref 39.0–52.0)
HCT: 30.2 % — ABNORMAL LOW (ref 39.0–52.0)
Hemoglobin: 11.5 g/dL — ABNORMAL LOW (ref 13.0–17.0)
Hemoglobin: 10.9 g/dL — ABNORMAL LOW (ref 13.0–17.0)
Hemoglobin: 10.3 g/dL — ABNORMAL LOW (ref 13.0–17.0)
MCH: 29.5 pg (ref 26.0–34.0)
MCH: 29.2 pg (ref 26.0–34.0)
MCH: 29.9 pg (ref 26.0–34.0)
MCHC: 33.2 g/dL (ref 30.0–36.0)
MCHC: 33 g/dL (ref 30.0–36.0)
MCHC: 34.1 g/dL (ref 30.0–36.0)
MCV: 88.7 fL (ref 78.0–100.0)
MCV: 88.5 fL (ref 78.0–100.0)
MCV: 87.5 fL (ref 78.0–100.0)
PLATELETS: 177 10*3/uL (ref 150–400)
PLATELETS: 167 10*3/uL (ref 150–400)
PLATELETS: 160 10*3/uL (ref 150–400)
RBC: 3.9 MIL/uL — AB (ref 4.22–5.81)
RBC: 3.73 MIL/uL — ABNORMAL LOW (ref 4.22–5.81)
RBC: 3.45 MIL/uL — AB (ref 4.22–5.81)
RDW: 15.3 % (ref 11.5–15.5)
RDW: 15.1 % (ref 11.5–15.5)
RDW: 15.1 % (ref 11.5–15.5)
WBC: 11.5 10*3/uL — ABNORMAL HIGH (ref 4.0–10.5)
WBC: 13.4 10*3/uL — AB (ref 4.0–10.5)
WBC: 13 10*3/uL — AB (ref 4.0–10.5)

## 2015-01-21 LAB — BASIC METABOLIC PANEL
Anion gap: 8 (ref 5–15)
BUN: 10 mg/dL (ref 6–20)
CO2: 24 mmol/L (ref 22–32)
Calcium: 8.5 mg/dL — ABNORMAL LOW (ref 8.9–10.3)
Chloride: 103 mmol/L (ref 101–111)
Creatinine, Ser: 1.56 mg/dL — ABNORMAL HIGH (ref 0.61–1.24)
GFR calc Af Amer: >60 mL/min (ref >60)
GFR, EST NON AFRICAN AMERICAN: 52 mL/min — AB (ref >60)
GLUCOSE: 132 mg/dL — AB (ref 65–99)
POTASSIUM: 4 mmol/L (ref 3.5–5.1)
Sodium: 135 mmol/L (ref 135–145)

## 2015-01-21 LAB — POCT I-STAT 3, ART BLOOD GAS (G3+)
Acid-base deficit: 4 mmol/L — ABNORMAL HIGH (ref 0.0–2.0)
BICARBONATE: 23.1 meq/L (ref 20.0–24.0)
O2 Saturation: 96 %
PH ART: 7.28 — AB (ref 7.350–7.450)
PO2 ART: 97 mmHg (ref 80.0–100.0)
Patient temperature: 37
TCO2: 25 mmol/L (ref 0–100)
pCO2 arterial: 49.2 mmHg — ABNORMAL HIGH (ref 35.0–45.0)

## 2015-01-21 LAB — CREATININE, SERUM
CREATININE: 1.76 mg/dL — AB (ref 0.61–1.24)
Creatinine, Ser: 1.75 mg/dL — ABNORMAL HIGH (ref 0.61–1.24)
GFR calc Af Amer: 53 mL/min — ABNORMAL LOW (ref >60)
GFR calc non Af Amer: 46 mL/min — ABNORMAL LOW (ref >60)
GFR, EST AFRICAN AMERICAN: 53 mL/min — AB (ref >60)
GFR, EST NON AFRICAN AMERICAN: 45 mL/min — AB (ref >60)

## 2015-01-21 LAB — GLUCOSE, CAPILLARY
GLUCOSE-CAPILLARY: 130 mg/dL — AB (ref 65–99)
GLUCOSE-CAPILLARY: 126 mg/dL — AB (ref 65–99)
Glucose-Capillary: 109 mg/dL — ABNORMAL HIGH (ref 65–99)
Glucose-Capillary: 115 mg/dL — ABNORMAL HIGH (ref 65–99)
Glucose-Capillary: 120 mg/dL — ABNORMAL HIGH (ref 65–99)
Glucose-Capillary: 123 mg/dL — ABNORMAL HIGH (ref 65–99)

## 2015-01-21 LAB — TROPONIN I
TROPONIN I: 12.78 ng/mL — AB (ref <0.031)
TROPONIN I: 10.32 ng/mL — AB (ref <0.031)
TROPONIN I: 8.6 ng/mL — AB (ref <0.031)

## 2015-01-21 LAB — POCT I-STAT, CHEM 8
BUN: 17 mg/dL (ref 6–20)
CALCIUM ION: 1.19 mmol/L (ref 1.12–1.23)
Chloride: 102 mmol/L (ref 101–111)
Creatinine, Ser: 1.8 mg/dL — ABNORMAL HIGH (ref 0.61–1.24)
GLUCOSE: 132 mg/dL — AB (ref 65–99)
HCT: 31 % — ABNORMAL LOW (ref 39.0–52.0)
HEMOGLOBIN: 10.5 g/dL — AB (ref 13.0–17.0)
Potassium: 4.1 mmol/L (ref 3.5–5.1)
Sodium: 135 mmol/L (ref 135–145)
TCO2: 23 mmol/L (ref 0–100)

## 2015-01-21 LAB — CK TOTAL AND CKMB (NOT AT ARMC)
CK, MB: 11.6 ng/mL — AB (ref 0.5–5.0)
RELATIVE INDEX: 0.9 (ref 0.0–2.5)
Total CK: 1240 U/L — ABNORMAL HIGH (ref 49–397)

## 2015-01-21 LAB — MAGNESIUM
MAGNESIUM: 2.1 mg/dL (ref 1.7–2.4)
Magnesium: 2 mg/dL (ref 1.7–2.4)

## 2015-01-21 MED ORDER — HYDRALAZINE HCL 20 MG/ML IJ SOLN
10.0000 mg | Freq: Once | INTRAMUSCULAR | Status: AC
  Administered 2015-01-21: 10 mg via INTRAVENOUS
  Filled 2015-01-21: qty 1

## 2015-01-21 MED ORDER — ALBUMIN HUMAN 5 % IV SOLN
12.5000 g | Freq: Once | INTRAVENOUS | Status: AC
  Administered 2015-01-21: 12.5 g via INTRAVENOUS
  Filled 2015-01-21: qty 250

## 2015-01-21 MED ORDER — INSULIN DETEMIR 100 UNIT/ML ~~LOC~~ SOLN
10.0000 [IU] | Freq: Every day | SUBCUTANEOUS | Status: DC
  Filled 2015-01-21: qty 0.1

## 2015-01-21 MED ORDER — CARVEDILOL 25 MG PO TABS
25.0000 mg | ORAL_TABLET | Freq: Two times a day (BID) | ORAL | Status: DC
  Administered 2015-01-21 – 2015-01-25 (×9): 25 mg via ORAL
  Filled 2015-01-21 (×10): qty 1

## 2015-01-21 MED ORDER — METOPROLOL TARTRATE 25 MG PO TABS
25.0000 mg | ORAL_TABLET | Freq: Two times a day (BID) | ORAL | Status: DC
  Administered 2015-01-21: 25 mg via ORAL
  Filled 2015-01-21 (×2): qty 1

## 2015-01-21 MED ORDER — INSULIN DETEMIR 100 UNIT/ML ~~LOC~~ SOLN
20.0000 [IU] | Freq: Every day | SUBCUTANEOUS | Status: AC
  Filled 2015-01-21: qty 0.2

## 2015-01-21 MED ORDER — FUROSEMIDE 10 MG/ML IJ SOLN
20.0000 mg | Freq: Once | INTRAMUSCULAR | Status: AC
  Administered 2015-01-21: 20 mg via INTRAVENOUS
  Filled 2015-01-21: qty 2

## 2015-01-21 MED ORDER — HYDRALAZINE HCL 10 MG PO TABS
10.0000 mg | ORAL_TABLET | Freq: Two times a day (BID) | ORAL | Status: DC
  Administered 2015-01-21 (×2): 10 mg via ORAL
  Filled 2015-01-21 (×4): qty 1

## 2015-01-21 MED ORDER — CLONIDINE HCL 0.1 MG PO TABS
0.1000 mg | ORAL_TABLET | Freq: Three times a day (TID) | ORAL | Status: DC
  Administered 2015-01-21 – 2015-01-25 (×14): 0.1 mg via ORAL
  Filled 2015-01-21 (×15): qty 1

## 2015-01-21 MED ORDER — INSULIN ASPART 100 UNIT/ML ~~LOC~~ SOLN
0.0000 [IU] | SUBCUTANEOUS | Status: DC
  Administered 2015-01-21 – 2015-01-22 (×3): 2 [IU] via SUBCUTANEOUS

## 2015-01-21 MED ORDER — LEVALBUTEROL HCL 0.63 MG/3ML IN NEBU: 0.6300 mg | INHALATION_SOLUTION | RESPIRATORY_TRACT | Status: DC | PRN

## 2015-01-21 MED ORDER — CETYLPYRIDINIUM CHLORIDE 0.05 % MT LIQD
7.0000 mL | Freq: Two times a day (BID) | OROMUCOSAL | Status: DC
  Administered 2015-01-21 – 2015-01-22 (×2): 7 mL via OROMUCOSAL

## 2015-01-21 MED ORDER — METOPROLOL TARTRATE 25 MG/10 ML ORAL SUSPENSION
12.5000 mg | Freq: Two times a day (BID) | ORAL | Status: DC
  Filled 2015-01-21 (×2): qty 5

## 2015-01-21 MED ORDER — ENOXAPARIN SODIUM 40 MG/0.4ML ~~LOC~~ SOLN
40.0000 mg | Freq: Every day | SUBCUTANEOUS | Status: DC
  Administered 2015-01-21 – 2015-01-24 (×4): 40 mg via SUBCUTANEOUS
  Filled 2015-01-21 (×6): qty 0.4

## 2015-01-21 MED ORDER — LEVALBUTEROL HCL 0.63 MG/3ML IN NEBU: 0.6300 mg | INHALATION_SOLUTION | Freq: Three times a day (TID) | RESPIRATORY_TRACT | Status: DC

## 2015-01-21 MED ORDER — POTASSIUM CHLORIDE 10 MEQ/50ML IV SOLN
10.0000 meq | INTRAVENOUS | Status: AC
  Administered 2015-01-21 (×2): 10 meq via INTRAVENOUS
  Filled 2015-01-21: qty 50

## 2015-01-21 MED ORDER — LABETALOL HCL 5 MG/ML IV SOLN
10.0000 mg | INTRAVENOUS | Status: DC | PRN
  Administered 2015-01-21 (×3): 10 mg via INTRAVENOUS
  Filled 2015-01-21 (×3): qty 4

## 2015-01-21 MED FILL — Magnesium Sulfate Inj 50%: INTRAMUSCULAR | Qty: 10 | Status: AC

## 2015-01-21 MED FILL — Potassium Chloride Inj 2 mEq/ML: INTRAVENOUS | Qty: 40 | Status: AC

## 2015-01-21 MED FILL — Mannitol IV Soln 20%: INTRAVENOUS | Qty: 500 | Status: AC

## 2015-01-21 MED FILL — Heparin Sodium (Porcine) Inj 1000 Unit/ML: INTRAMUSCULAR | Qty: 10 | Status: AC

## 2015-01-21 MED FILL — Sodium Chloride IV Soln 0.9%: INTRAVENOUS | Qty: 2000 | Status: AC

## 2015-01-21 MED FILL — Heparin Sodium (Porcine) Inj 1000 Unit/ML: INTRAMUSCULAR | Qty: 30 | Status: AC

## 2015-01-21 MED FILL — Lidocaine HCl IV Inj 20 MG/ML: INTRAVENOUS | Qty: 10 | Status: AC

## 2015-01-21 MED FILL — Electrolyte-R (PH 7.4) Solution: INTRAVENOUS | Qty: 4000 | Status: AC

## 2015-01-21 MED FILL — Sodium Bicarbonate IV Soln 8.4%: INTRAVENOUS | Qty: 50 | Status: AC

## 2015-01-21 NOTE — Progress Notes (Signed)
1 Day Post-Op Procedure(s) (LRB): CORONARY ARTERY BYPASS GRAFTING (CABG) X 4 using bilateral internal mammary arteries and left saphenous leg vein harvested endoscopically. (N/A) TRANSESOPHAGEAL ECHOCARDIOGRAM (TEE) (N/A) Subjective: C/o incisional pain  Objective: Vital signs in last 24 hours: Temp:  [99.5 F (37.5 C)-101.8 F (38.8 C)] 99.9 F (37.7 C) (09/15 0700) Pulse Rate:  [77-101] 100 (09/15 0700) Cardiac Rhythm:  [-] Atrial paced (09/14 2000) Resp:  [13-33] 28 (09/15 0700) BP: (62-159)/(32-139) 139/80 mmHg (09/15 0700) SpO2:  [95 %-100 %] 95 % (09/15 0759) Arterial Line BP: (88-170)/(50-100) 148/84 mmHg (09/15 0700) FiO2 (%):  [40 %-50 %] 40 % (09/14 1924) Weight:  [257 lb 4.4 oz (116.7 kg)] 257 lb 4.4 oz (116.7 kg) (09/15 0445)  Hemodynamic parameters for last 24 hours: PAP: (24-43)/(8-23) 30/9 mmHg CO:  [2.2 L/min-7.5 L/min] 5.6 L/min CI:  [2.4 L/min/m2-3.4 L/min/m2] 2.5 L/min/m2  Intake/Output from previous day: 09/14 0701 - 09/15 0700 In: 6965 [P.O.:480; I.V.:5025; Blood:950; NG/GT:60; IV Piggyback:450] Out: 7330 [Urine:5690; Blood:900; Chest Tube:740] Intake/Output this shift:    General appearance: alert and no distress Neurologic: intact Heart: regular rate and rhythm and friction rub heard apex Lungs: diminished breath sounds bibasilar Abdomen: normal findings: soft, non-tender  Lab Results:  Recent Labs  01/20/15 2200 01/21/15 0410  WBC 10.3 11.5*  HGB 12.4* 11.5*  HCT 37.8* 34.6*  PLT 185 177   BMET:  Recent Labs  01/20/15 0017  01/20/15 2157 01/20/15 2200 01/21/15 0410  NA 136  < > 140  --  135  K 3.8  < > 3.9  --  4.0  CL 103  < > 106  --  103  CO2 26  --   --   --  24  GLUCOSE 125*  < > 126*  --  132*  BUN 13  < > 12  --  10  CREATININE 1.57*  < > 1.40* 1.49* 1.56*  CALCIUM 9.6  --   --   --  8.5*  < > = values in this interval not displayed.  PT/INR:  Recent Labs  01/20/15 1600  LABPROT 15.0  INR 1.17   ABG    Component  Value Date/Time   PHART 7.341* 01/20/2015 2201   HCO3 23.2 01/20/2015 2201   TCO2 24 01/20/2015 2201   ACIDBASEDEF 2.0 01/20/2015 2201   O2SAT 96.0 01/20/2015 2201   CBG (last 3)   Recent Labs  01/20/15 2155 01/20/15 2326 01/21/15 0359  GLUCAP 117* 121* 120*    Assessment/Plan: S/P Procedure(s) (LRB): CORONARY ARTERY BYPASS GRAFTING (CABG) X 4 using bilateral internal mammary arteries and left saphenous leg vein harvested endoscopically. (N/A) TRANSESOPHAGEAL ECHOCARDIOGRAM (TEE) (N/A) POD # 1  CV- good cardiac index  His ECG shows J point elevation anteriorly- was present preop but look a little more pronounced. Reviewed with Dr. Angelena Form who was in ICU  He has a rub so may be pericarditis  Will check enzymes  HYPERTENSION- restart coreg and clonidine  RESP- IS for bibasilar atelectasis- has effusions as well- keep CT in place  RENAL_ creatinine near baseline, lytes OK  ENDO- CBG well controlled- transition to levemir + SSI  SCD + Enoxaparin for DVT prophylaxis    LOS: 9 days    [name] 01/21/2015

## 2015-01-21 NOTE — Care Management Note (Signed)
Case Management Note  Patient Details  Name: Scott Salinas MRN: [id number] Date of Birth: 1971-04-24  Subjective/Objective:       Plan is for discharge home with family.              Action/Plan:   Expected Discharge Date:   (unknown)               Expected Discharge Plan:  Home/Self Care  In-House Referral:     Discharge planning Services  CM Consult  Post Acute Care Choice:    Choice offered to:     DME Arranged:  N/A DME Agency:     HH Arranged:    HH Agency:     Status of Service:  In process, will continue to follow  Medicare Important Message Given:    Date Medicare IM Given:    Medicare IM give by:    Date Additional Medicare IM Given:    Additional Medicare Important Message give by:     If discussed at [place] of Stay Meetings, dates discussed:    Additional Comments:  Rae, [place], RN 01/21/2015, 3:02 PM
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
## 2015-01-21 NOTE — Progress Notes (Signed)
Critical Value received from lab. Troponin 12.78.  MD Roxan Hockey notified. No further orders at this time.  Will continue to monitor.

## 2015-01-22 ENCOUNTER — Inpatient Hospital Stay (HOSPITAL_COMMUNITY): Payer: Medicaid Other

## 2015-01-22 LAB — GLUCOSE, CAPILLARY
GLUCOSE-CAPILLARY: 129 mg/dL — AB (ref 65–99)
GLUCOSE-CAPILLARY: 109 mg/dL — AB (ref 65–99)
Glucose-Capillary: 121 mg/dL — ABNORMAL HIGH (ref 65–99)
Glucose-Capillary: 98 mg/dL (ref 65–99)
Glucose-Capillary: 95 mg/dL (ref 65–99)

## 2015-01-22 LAB — BASIC METABOLIC PANEL
ANION GAP: 8 (ref 5–15)
BUN: 18 mg/dL (ref 6–20)
CALCIUM: 8.4 mg/dL — AB (ref 8.9–10.3)
CO2: 26 mmol/L (ref 22–32)
Chloride: 100 mmol/L — ABNORMAL LOW (ref 101–111)
Creatinine, Ser: 1.62 mg/dL — ABNORMAL HIGH (ref 0.61–1.24)
GFR calc Af Amer: 58 mL/min — ABNORMAL LOW (ref >60)
GFR calc non Af Amer: 50 mL/min — ABNORMAL LOW (ref >60)
GLUCOSE: 155 mg/dL — AB (ref 65–99)
Potassium: 4 mmol/L (ref 3.5–5.1)
Sodium: 134 mmol/L — ABNORMAL LOW (ref 135–145)

## 2015-01-22 LAB — CBC
HCT: 27.9 % — ABNORMAL LOW (ref 39.0–52.0)
HEMOGLOBIN: 9.4 g/dL — AB (ref 13.0–17.0)
MCH: 30.2 pg (ref 26.0–34.0)
MCHC: 33.7 g/dL (ref 30.0–36.0)
MCV: 89.7 fL (ref 78.0–100.0)
Platelets: 134 10*3/uL — ABNORMAL LOW (ref 150–400)
RBC: 3.11 MIL/uL — ABNORMAL LOW (ref 4.22–5.81)
RDW: 15.1 % (ref 11.5–15.5)
WBC: 13.5 10*3/uL — ABNORMAL HIGH (ref 4.0–10.5)

## 2015-01-22 MED ORDER — SODIUM CHLORIDE 0.9 % IJ SOLN: 3.0000 mL | INTRAMUSCULAR | Status: DC | PRN

## 2015-01-22 MED ORDER — HYDROCHLOROTHIAZIDE 12.5 MG PO CAPS
12.5000 mg | ORAL_CAPSULE | Freq: Every day | ORAL | Status: DC
  Administered 2015-01-22 – 2015-01-25 (×4): 12.5 mg via ORAL
  Filled 2015-01-22 (×3): qty 1

## 2015-01-22 MED ORDER — ALPRAZOLAM 0.25 MG PO TABS
0.2500 mg | ORAL_TABLET | Freq: Four times a day (QID) | ORAL | Status: DC | PRN
  Administered 2015-01-22 – 2015-01-25 (×9): 0.25 mg via ORAL
  Filled 2015-01-22 (×9): qty 1

## 2015-01-22 MED ORDER — OXYCODONE HCL 5 MG PO TABS
5.0000 mg | ORAL_TABLET | ORAL | Status: DC | PRN
  Administered 2015-01-22: 10 mg via ORAL
  Administered 2015-01-23: 15 mg via ORAL
  Administered 2015-01-23: 10 mg via ORAL
  Administered 2015-01-23: 15 mg via ORAL
  Administered 2015-01-23: 10 mg via ORAL
  Administered 2015-01-23 (×2): 15 mg via ORAL
  Administered 2015-01-23: 10 mg via ORAL
  Administered 2015-01-24 – 2015-01-25 (×7): 15 mg via ORAL
  Administered 2015-01-25: 10 mg via ORAL
  Administered 2015-01-25 (×2): 15 mg via ORAL
  Filled 2015-01-22 (×6): qty 3
  Filled 2015-01-22: qty 2
  Filled 2015-01-22 (×3): qty 3
  Filled 2015-01-22: qty 2
  Filled 2015-01-22 (×2): qty 3
  Filled 2015-01-22 (×2): qty 2
  Filled 2015-01-22 (×2): qty 3
  Filled 2015-01-22: qty 2

## 2015-01-22 MED ORDER — HYDRALAZINE HCL 25 MG PO TABS
25.0000 mg | ORAL_TABLET | Freq: Three times a day (TID) | ORAL | Status: DC
  Administered 2015-01-22 – 2015-01-25 (×11): 25 mg via ORAL
  Filled 2015-01-22 (×12): qty 1

## 2015-01-22 MED ORDER — ALUM & MAG HYDROXIDE-SIMETH 200-200-20 MG/5ML PO SUSP: 15.0000 mL | ORAL | Status: DC | PRN

## 2015-01-22 MED ORDER — ZOLPIDEM TARTRATE 5 MG PO TABS
5.0000 mg | ORAL_TABLET | Freq: Every evening | ORAL | Status: DC | PRN
  Administered 2015-01-24: 5 mg via ORAL
  Filled 2015-01-22: qty 1

## 2015-01-22 MED ORDER — SODIUM CHLORIDE 0.9 % IV SOLN: 250.0000 mL | INTRAVENOUS | Status: DC | PRN

## 2015-01-22 MED ORDER — FUROSEMIDE 10 MG/ML IJ SOLN
40.0000 mg | Freq: Once | INTRAMUSCULAR | Status: AC
  Administered 2015-01-22: 40 mg via INTRAVENOUS
  Filled 2015-01-22: qty 4

## 2015-01-22 MED ORDER — LEVALBUTEROL HCL 0.63 MG/3ML IN NEBU: 0.6300 mg | INHALATION_SOLUTION | Freq: Three times a day (TID) | RESPIRATORY_TRACT | Status: DC | PRN

## 2015-01-22 MED ORDER — INSULIN ASPART 100 UNIT/ML ~~LOC~~ SOLN
0.0000 [IU] | Freq: Three times a day (TID) | SUBCUTANEOUS | Status: DC
  Administered 2015-01-22 – 2015-01-24 (×3): 2 [IU] via SUBCUTANEOUS

## 2015-01-22 MED ORDER — MOVING RIGHT ALONG BOOK
Freq: Once | Status: AC
  Administered 2015-01-22: 15:00:00
  Filled 2015-01-22: qty 1

## 2015-01-22 MED ORDER — MAGNESIUM HYDROXIDE 400 MG/5ML PO SUSP
30.0000 mL | Freq: Every day | ORAL | Status: DC | PRN
Start: 2015-01-22 — End: 2015-01-25

## 2015-01-22 MED ORDER — HYDROCHLOROTHIAZIDE 25 MG PO TABS
12.5000 mg | ORAL_TABLET | Freq: Every day | ORAL | Status: DC
  Filled 2015-01-22: qty 0.5

## 2015-01-22 MED ORDER — SODIUM CHLORIDE 0.9 % IJ SOLN
3.0000 mL | Freq: Two times a day (BID) | INTRAMUSCULAR | Status: DC
  Administered 2015-01-22 – 2015-01-25 (×7): 3 mL via INTRAVENOUS

## 2015-01-22 MED ORDER — AMLODIPINE BESYLATE 10 MG PO TABS
10.0000 mg | ORAL_TABLET | Freq: Every day | ORAL | Status: DC
  Administered 2015-01-22 – 2015-01-25 (×4): 10 mg via ORAL
  Filled 2015-01-22 (×4): qty 1

## 2015-01-22 MED ORDER — FUROSEMIDE 40 MG PO TABS
40.0000 mg | ORAL_TABLET | Freq: Every day | ORAL | Status: DC
  Administered 2015-01-23 – 2015-01-25 (×3): 40 mg via ORAL
  Filled 2015-01-22 (×3): qty 1

## 2015-01-22 NOTE — Progress Notes (Signed)
2 Days Post-Op Procedure(s) (LRB): CORONARY ARTERY BYPASS GRAFTING (CABG) X 4 using bilateral internal mammary arteries and left saphenous leg vein harvested endoscopically. (N/A) TRANSESOPHAGEAL ECHOCARDIOGRAM (TEE) (N/A) Subjective: C/o incisional pain  Objective: Vital signs in last 24 hours: Temp:  [98.3 F (36.8 C)-100 F (37.8 C)] 98.3 F (36.8 C) (09/16 0413) Pulse Rate:  [32-114] 114 (09/16 0700) Cardiac Rhythm:  [-] Normal sinus rhythm (09/15 1945) Resp:  [17-35] 30 (09/16 0700) BP: (96-156)/(54-104) 123/83 mmHg (09/16 0700) SpO2:  [86 %-100 %] 97 % (09/16 0700) Arterial Line BP: (119-178)/(70-102) 134/77 mmHg (09/15 1600)  Hemodynamic parameters for last 24 hours: PAP: (19-26)/(8-14) 19/8 mmHg CO:  [5.3 L/min] 5.3 L/min CI:  [2.4 L/min/m2] 2.4 L/min/m2  Intake/Output from previous day: 09/15 0701 - 09/16 0700 In: 1613.5 [P.O.:480; I.V.:983.5; IV Piggyback:150] Out: 2655 [Urine:2275; Chest Tube:380] Intake/Output this shift:    General appearance: alert and mild distress Neurologic: intact Heart: tachy, regular, faint rub Lungs: diminished breath sounds bibasilar Abdomen: mildly distended, tender at CT sites, otherwise benign  Lab Results:  Recent Labs  01/21/15 1432 01/21/15 1615 01/22/15 0445  WBC 13.0*  --  13.5*  HGB 10.3* 10.5* 9.4*  HCT 30.2* 31.0* 27.9*  PLT 160  --  134*   BMET:  Recent Labs  01/21/15 0410  01/21/15 1615 01/22/15 0445  NA 135  --  135 134*  K 4.0  --  4.1 4.0  CL 103  --  102 100*  CO2 24  --   --  26  GLUCOSE 132*  --  132* 155*  BUN 10  --  17 18  CREATININE 1.56*  < > 1.80* 1.62*  CALCIUM 8.5*  --   --  8.4*  < > = values in this interval not displayed.  PT/INR:  Recent Labs  01/20/15 1600  LABPROT 15.0  INR 1.17   ABG    Component Value Date/Time   PHART 7.341* 01/20/2015 2201   HCO3 23.2 01/20/2015 2201   TCO2 23 01/21/2015 1615   ACIDBASEDEF 2.0 01/20/2015 2201   O2SAT 96.0 01/20/2015 2201   CBG  (last 3)   Recent Labs  01/21/15 1926 01/21/15 2344 01/22/15 0343  GLUCAP 126* 115* 121*    Assessment/Plan: S/P Procedure(s) (LRB): CORONARY ARTERY BYPASS GRAFTING (CABG) X 4 using bilateral internal mammary arteries and left saphenous leg vein harvested endoscopically. (N/A) TRANSESOPHAGEAL ECHOCARDIOGRAM (TEE) (N/A) Plan for transfer to step-down: see transfer orders   POD # 2 CABG  CV- still hypertensive- restart PO hydralazine, HCTZ and amlodipine, continue coreg, clonidine  troponins trending down- suspect just expected enzyme release due to surgery, defib, etc  RESP_ continue IS  RENAL- creatinine better this AM, lytes OK  ENDO_ CBG well controlled, dc levemir, change to AC/HS  Mobilize ' SCD + enoxaparin for DVT prophylaxis  transfer to 2west   LOS: 10 days    [name] 01/22/2015

## 2015-01-23 ENCOUNTER — Inpatient Hospital Stay (HOSPITAL_COMMUNITY): Payer: Medicaid Other

## 2015-01-23 LAB — CBC
HEMATOCRIT: 30.6 % — AB (ref 39.0–52.0)
Hemoglobin: 10 g/dL — ABNORMAL LOW (ref 13.0–17.0)
MCH: 29.2 pg (ref 26.0–34.0)
MCHC: 32.7 g/dL (ref 30.0–36.0)
MCV: 89.2 fL (ref 78.0–100.0)
Platelets: 183 10*3/uL (ref 150–400)
RBC: 3.43 MIL/uL — AB (ref 4.22–5.81)
RDW: 15.1 % (ref 11.5–15.5)
WBC: 13.6 10*3/uL — AB (ref 4.0–10.5)

## 2015-01-23 LAB — BASIC METABOLIC PANEL
ANION GAP: 10 (ref 5–15)
BUN: 20 mg/dL (ref 6–20)
CHLORIDE: 98 mmol/L — AB (ref 101–111)
CO2: 28 mmol/L (ref 22–32)
Calcium: 9.2 mg/dL (ref 8.9–10.3)
Creatinine, Ser: 1.57 mg/dL — ABNORMAL HIGH (ref 0.61–1.24)
GFR calc Af Amer: >60 mL/min (ref >60)
GFR calc non Af Amer: 52 mL/min — ABNORMAL LOW (ref >60)
GLUCOSE: 128 mg/dL — AB (ref 65–99)
POTASSIUM: 3.6 mmol/L (ref 3.5–5.1)
Sodium: 136 mmol/L (ref 135–145)

## 2015-01-23 LAB — GLUCOSE, CAPILLARY
GLUCOSE-CAPILLARY: 96 mg/dL (ref 65–99)
GLUCOSE-CAPILLARY: 116 mg/dL — AB (ref 65–99)
Glucose-Capillary: 122 mg/dL — ABNORMAL HIGH (ref 65–99)
Glucose-Capillary: 120 mg/dL — ABNORMAL HIGH (ref 65–99)

## 2015-01-23 NOTE — Progress Notes (Signed)
CARDIAC REHAB PHASE I   PRE:  Rate/Rhythm: 86 SR c PVC's   BP:   Sitting: 109/57     SaO2: 96% 2L  MODE:  Ambulation: 500 ft   POST:  Rate/Rhythm: 104 SR  BP:   Sitting: 111/70     SaO2: 98% 2L    93% 1L  Pt ambulated 500 feet with walker, 1 person assist and on 1L of SpO2. Pt did complain of mild shortness of breath with walking but was able to walk entire distance without stopping.Pt SpO2 remained over 90% on 1 L the entire time while walking. Pt returned to recliner with LE elevated and VSS. Discussed the importance of not using UE to get up and not to put too much pressure on arms//hands when using walker.  [id number] Amber D Fair,MS,ACSM-RCEP 01/23/2015 2:00 PM

## 2015-01-23 NOTE — Progress Notes (Addendum)
       [address]       [place] [address]             [phone]          3 Days Post-Op Procedure(s) (LRB): CORONARY ARTERY BYPASS GRAFTING (CABG) X 4 using bilateral internal mammary arteries and left saphenous leg vein harvested endoscopically. (N/A) TRANSESOPHAGEAL ECHOCARDIOGRAM (TEE) (N/A)  Subjective: OOB in chair.  Has walked already this am. Sore in chest, appetite marginal. No BM yet.   Objective: Vital signs in last 24 hours: Patient Vitals for the past 24 hrs:  BP Temp Temp src Pulse Resp SpO2 Weight  01/23/15 0944 118/76 mmHg 99.2 F (37.3 C) Oral 85 18 98 % -  01/23/15 0443 108/69 mmHg 98.6 F (37 C) Oral 93 20 94 % 250 lb 1.6 oz (113.445 kg)  01/22/15 2013 106/72 mmHg 98.6 F (37 C) Oral 95 (!) 22 100 % -  01/22/15 1504 (!) 137/92 mmHg 98.4 F (36.9 C) Oral 96 20 100 % -  01/22/15 1300 120/79 mmHg - - 93 (!) 32 97 % -  01/22/15 1200 120/71 mmHg 97.9 F (36.6 C) Oral 94 (!) 30 98 % -  01/22/15 1100 130/88 mmHg - - 92 16 98 % -  01/22/15 1039 129/85 mmHg - - - - - -   Current Weight  01/23/15 250 lb 1.6 oz (113.445 kg)  PRE-OPERATIVE WEIGHT: 113 kg   Intake/Output from previous day: 09/16 0701 - 09/17 0700 In: 435 [P.O.:360; I.V.:75] Out: 2525 [Urine:2425; Chest Tube:100]  CBGs 109-122-128   PHYSICAL EXAM:  Heart: RRR, few PVCs Lungs: Clear Wound: Clean and dry Extremities: Mild LE edema    Lab Results: CBC: Recent Labs  01/22/15 0445 01/23/15 0600  WBC 13.5* 13.6*  HGB 9.4* 10.0*  HCT 27.9* 30.6*  PLT 134* 183   BMET:  Recent Labs  01/22/15 0445 01/23/15 0600  NA 134* 136  K 4.0 3.6  CL 100* 98*  CO2 26 28  GLUCOSE 155* 128*  BUN 18 20  CREATININE 1.62* 1.57*  CALCIUM 8.4* 9.2    PT/INR:  Recent Labs  01/20/15 1600  LABPROT 15.0  INR 1.17   CXR: FINDINGS: The cardiac silhouette, mediastinal and hilar contours are stable. The right IJ Cordis has been removed. Bilateral chest tubes have been  removed. No pneumothorax identified. Moderate bibasilar subsegmental atelectasis and small persistent left effusion. No pulmonary edema.  IMPRESSION: Removal of the right IJ Cordis and bilateral chest tubes. No pneumothorax.  Bibasilar atelectasis and probable small left effusion.   Assessment/Plan: S/P Procedure(s) (LRB): CORONARY ARTERY BYPASS GRAFTING (CABG) X 4 using bilateral internal mammary arteries and left saphenous leg vein harvested endoscopically. (N/A) TRANSESOPHAGEAL ECHOCARDIOGRAM (TEE) (N/A)  CV- SR, BPs controlled on home meds. Continue current care.  Endocrine- CBGs stable on SSI. A1C=6.2  Vol overload- continue diuresis.  AKI- Cr trending down, continue to monitor.  CRPI, pulm toilet/IS.   LOS: 11 days    COLLINS,GINA H 01/23/2015 No one home to help him until monday I have seen and examined [name] and agree with the above assessment  and plan.  Grace Isaac MD Beeper [phone] Office [phone] 01/23/2015 12:46 PM

## 2015-01-24 LAB — BASIC METABOLIC PANEL
Anion gap: 11 (ref 5–15)
BUN: 17 mg/dL (ref 6–20)
CHLORIDE: 99 mmol/L — AB (ref 101–111)
CO2: 26 mmol/L (ref 22–32)
Calcium: 8.9 mg/dL (ref 8.9–10.3)
Creatinine, Ser: 1.45 mg/dL — ABNORMAL HIGH (ref 0.61–1.24)
GFR calc Af Amer: >60 mL/min (ref >60)
GFR calc non Af Amer: 57 mL/min — ABNORMAL LOW (ref >60)
GLUCOSE: 99 mg/dL (ref 65–99)
POTASSIUM: 4.2 mmol/L (ref 3.5–5.1)
Sodium: 136 mmol/L (ref 135–145)

## 2015-01-24 LAB — GLUCOSE, CAPILLARY
Glucose-Capillary: 128 mg/dL — ABNORMAL HIGH (ref 65–99)
Glucose-Capillary: 108 mg/dL — ABNORMAL HIGH (ref 65–99)
Glucose-Capillary: 131 mg/dL — ABNORMAL HIGH (ref 65–99)
Glucose-Capillary: 116 mg/dL — ABNORMAL HIGH (ref 65–99)

## 2015-01-24 NOTE — Progress Notes (Addendum)
       [address]       [place] [address]             [phone]          4 Days Post-Op Procedure(s) (LRB): CORONARY ARTERY BYPASS GRAFTING (CABG) X 4 using bilateral internal mammary arteries and left saphenous leg vein harvested endoscopically. (N/A) TRANSESOPHAGEAL ECHOCARDIOGRAM (TEE) (N/A)  Subjective: Sore, no other complaints.   Objective: Vital signs in last 24 hours: Patient Vitals for the past 24 hrs:  BP Temp Temp src Pulse Resp SpO2 Weight  01/24/15 0518 138/86 mmHg 98.5 F (36.9 C) Oral 98 18 100 % 246 lb 3.2 oz (111.676 kg)  01/23/15 1948 130/76 mmHg 98.6 F (37 C) Oral 80 18 99 % -  01/23/15 1557 124/76 mmHg 98.7 F (37.1 C) Oral 83 18 100 % -  01/23/15 0944 118/76 mmHg 99.2 F (37.3 C) Oral 85 18 98 % -   Current Weight  01/24/15 246 lb 3.2 oz (111.676 kg)  PRE-OPERATIVE WEIGHT: 113 kg   Intake/Output from previous day: 09/17 0701 - 09/18 0700 In: -  Out: 125 [Urine:125] CBGs [id number]   PHYSICAL EXAM:  Heart: RRR Lungs: Clear Wound: Clean and dry Extremities: Mild LE edema    Lab Results: CBC: Recent Labs  01/22/15 0445 01/23/15 0600  WBC 13.5* 13.6*  HGB 9.4* 10.0*  HCT 27.9* 30.6*  PLT 134* 183   BMET:  Recent Labs  01/23/15 0600 01/24/15 0409  NA 136 136  K 3.6 4.2  CL 98* 99*  CO2 28 26  GLUCOSE 128* 99  BUN 20 17  CREATININE 1.57* 1.45*  CALCIUM 9.2 8.9    PT/INR: No results for input(s): LABPROT, INR in the last 72 hours.    Assessment/Plan: S/P Procedure(s) (LRB): CORONARY ARTERY BYPASS GRAFTING (CABG) X 4 using bilateral internal mammary arteries and left saphenous leg vein harvested endoscopically. (N/A) TRANSESOPHAGEAL ECHOCARDIOGRAM (TEE) (N/A)  CV- SR, BPs controlled on home meds. Continue current care.  Endocrine- CBGs stable on SSI. A1C=6.2 Will need OP followup.  Vol overload- continue diuresis.  AKI- Cr trending down, continue to monitor.  CRPI, pulm toilet/IS. D/c  EPWs.  Home in am if he remains stable.   LOS: 12 days    COLLINS,GINA H 01/24/2015  I have seen and examined [name] and agree with the above assessment  and plan.  Grace Isaac MD Beeper [phone] Office [phone] 01/24/2015 11:47 AM

## 2015-01-25 LAB — GLUCOSE, CAPILLARY
Glucose-Capillary: 103 mg/dL — ABNORMAL HIGH (ref 65–99)
Glucose-Capillary: 104 mg/dL — ABNORMAL HIGH (ref 65–99)

## 2015-01-25 MED ORDER — ALPRAZOLAM 0.25 MG PO TABS: 0.2500 mg | ORAL_TABLET | Freq: Four times a day (QID) | ORAL | Status: DC | PRN

## 2015-01-25 MED ORDER — OXYCODONE HCL 5 MG PO TABS: 5.0000 mg | ORAL_TABLET | ORAL | Status: DC | PRN

## 2015-01-25 MED ORDER — HYDROCHLOROTHIAZIDE 12.5 MG PO TABS
12.5000 mg | ORAL_TABLET | Freq: Every day | ORAL | Status: DC
Start: 2015-01-25 — End: 2015-03-22

## 2015-01-25 MED ORDER — HYDRALAZINE HCL 25 MG PO TABS: 25.0000 mg | ORAL_TABLET | Freq: Three times a day (TID) | ORAL | Status: DC

## 2015-01-25 NOTE — Progress Notes (Signed)
Nursing note Pt given discharge instructions medication list and paper prescription, avs and follow up appointments given pt verbalized understanding will discharge home as ordered. Cloer, Bettina Gavia RN

## 2015-01-25 NOTE — Discharge Instructions (Signed)
Activity: 1.May walk up steps °               2.No lifting more than ten pounds for four weeks.  °               3.No driving for four weeks. °               4.Stop any activity that causes chest pain, shortness of breath, dizziness, sweating or excessive weakness. °               5.Avoid straining. °               6.Continue with your breathing exercises daily. ° °Diet: Diabetic diet and Low fat, Low salt diet ° °Wound Care: May shower.  Clean wounds with mild soap and water daily. Contact the office at [phone] if any problems arise. ° °Coronary Artery Bypass Grafting, Care After °Refer to this sheet in the next few weeks. These instructions provide you with information on caring for yourself after your procedure. Your health care provider may also give you more specific instructions. Your treatment has been planned according to current medical practices, but problems sometimes occur. Call your health care provider if you have any problems or questions after your procedure. °WHAT TO EXPECT AFTER THE PROCEDURE °Recovery from surgery will be different for everyone. Some people feel well after 3 or 4 weeks, while for others it takes longer. After your procedure, it is typical to have the following: °· Nausea and a lack of appetite.   °· Constipation. °· Weakness and fatigue.   °· Depression or irritability.   °· Pain or discomfort at your incision site. °HOME CARE INSTRUCTIONS °· Take medicines only as directed by your health care provider. Do not stop taking medicines or start any new medicines without first checking with your health care provider. °· Take your pulse as directed by your health care provider. °· Perform deep breathing as directed by your health care provider. If you were given a device called an incentive spirometer, use it to practice deep breathing several times a day. Support your chest with a pillow or your arms when you take deep breaths or cough. °· Keep incision areas clean, dry, and  protected. Remove or change any bandages (dressings) only as directed by your health care provider. You may have skin adhesive strips over the incision areas. Do not take the strips off. They will fall off on their own. °· Check incision areas daily for any swelling, redness, or drainage. °· If incisions were made in your legs, do the following: °¨ Avoid crossing your legs.   °¨ Avoid sitting for long periods of time. Change positions every 30 minutes.   °¨ Elevate your legs when you are sitting. °· Wear compression stockings as directed by your health care provider. These stockings help keep blood clots from forming in your legs. °· Take showers once your health care provider approves. Until then, only take sponge baths. Pat incisions dry. Do not rub incisions with a washcloth or towel. Do not take baths, swim, or use a hot tub until your health care provider approves. °· Eat foods that are high in fiber, such as raw fruits and vegetables, whole grains, beans, and nuts. Meats should be lean cut. Avoid canned, processed, and fried foods. °· Drink enough fluid to keep your urine clear or pale yellow. °· Weigh yourself every day. This helps identify if you are retaining fluid that may make your heart and lungs   work harder. °· Rest and limit activity as directed by your health care provider. You may be instructed to: °¨ Stop any activity at once if you have chest pain, shortness of breath, irregular heartbeats, or dizziness. Get help right away if you have any of these symptoms. °¨ Move around frequently for short periods or take short walks as directed by your health care provider. Increase your activities gradually. You may need physical therapy or cardiac rehabilitation to help strengthen your muscles and build your endurance. °¨ Avoid lifting, pushing, or pulling anything heavier than 10 lb (4.5 kg) for at least 6 weeks after surgery. °· Do not drive until your health care provider approves.  °· Ask your health  care provider when you may return to work. °· Ask your health care provider when you may resume sexual activity. °· Keep all follow-up visits as directed by your health care provider. This is important. °SEEK MEDICAL CARE IF: °· You have swelling, redness, increasing pain, or drainage at the site of an incision. °· You have a fever. °· You have swelling in your ankles or legs. °· You have pain in your legs.   °· You gain 2 or more pounds (0.9 kg) a day. °· You are nauseous or vomit. °· You have diarrhea.  °SEEK IMMEDIATE MEDICAL CARE IF: °· You have chest pain that goes to your jaw or arms. °· You have shortness of breath.   °· You have a fast or irregular heartbeat.   °· You notice a "clicking" in your breastbone (sternum) when you move.   °· You have numbness or weakness in your arms or legs. °· You feel dizzy or light-headed.   °MAKE SURE YOU: °· Understand these instructions. °· Will watch your condition. °· Will get help right away if you are not doing well or get worse. °Document Released: 11/11/2004 Document Revised: 09/08/2013 Document Reviewed: 10/01/2012 °ExitCare® Patient Information ©2015 [employer]. This information is not intended to replace advice given to you by your health care provider. Make sure you discuss any questions you have with your health care provider. ° ° ° °

## 2015-01-25 NOTE — Care Management Note (Signed)
Case Management Note  Patient Details  Name: Scott Salinas MRN: [id number] Date of Birth: [birth year]  Subjective/Objective:    Pt is s/p CABG                Action/Plan:  [place].     Expected Discharge Date:   (unknown)               Expected Discharge Plan:  Home/Self Care  In-House Referral:     Discharge planning Services  CM Consult  Post Acute Care Choice:    Choice offered to:     DME Arranged:  N/A DME Agency:     HH Arranged:    [place] Agency:     Status of Service: Complete, will sign off  Medicare Important Message Given:    Date Medicare IM Given:    Medicare IM give by:    Date Additional Medicare IM Given:    Additional Medicare Important Message give by:     If discussed at [place] of Stay Meetings, dates discussed:    Additional Comments: CM assessed pt; pt stated he was not interested in speaking with CM, told CM that he did not want rolling walker, pt previously requested.  CM attempted to ask pt about discharge plans (follow up with PCP, post discharge needs; pt would not answer questions and stated he didn't need anything.  No CM needs identified.    Maryclare Labrador, RN 01/25/2015, 1:52 PM

## 2015-01-25 NOTE — Progress Notes (Addendum)
      [address]       [place] [address]             [phone]      5 Days Post-Op Procedure(s) (LRB): CORONARY ARTERY BYPASS GRAFTING (CABG) X 4 using bilateral internal mammary arteries and left saphenous leg vein harvested endoscopically. (N/A) TRANSESOPHAGEAL ECHOCARDIOGRAM (TEE) (N/A)   Subjective:  Scott Salinas has complains of some sternal discomfort.  He is ambulating independently.  + BM  Objective: Vital signs in last 24 hours: Temp:  [98.5 F (36.9 C)-98.9 F (37.2 C)] 98.5 F (36.9 C) (09/19 0519) Pulse Rate:  [81-97] 97 (09/19 0519) Cardiac Rhythm:  [-] Normal sinus rhythm (09/18 2000) Resp:  [18] 18 (09/19 0519) BP: (128-134)/(77-97) 128/97 mmHg (09/19 0519) SpO2:  [97 %-99 %] 97 % (09/19 0519) Weight:  [245 lb 6.4 oz (111.313 kg)] 245 lb 6.4 oz (111.313 kg) (09/19 0252)  General appearance: alert, cooperative and no distress Heart: regular rate and rhythm Lungs: clear to auscultation bilaterally Abdomen: soft, non-tender; bowel sounds normal; no masses,  no organomegaly Extremities: edema mutiple scars bilaterally, EVH site clean and dry Wound: clean and dry  Lab Results:  Recent Labs  01/23/15 0600  WBC 13.6*  HGB 10.0*  HCT 30.6*  PLT 183   BMET:  Recent Labs  01/23/15 0600 01/24/15 0409  NA 136 136  K 3.6 4.2  CL 98* 99*  CO2 28 26  GLUCOSE 128* 99  BUN 20 17  CREATININE 1.57* 1.45*  CALCIUM 9.2 8.9    PT/INR: No results for input(s): LABPROT, INR in the last 72 hours. ABG    Component Value Date/Time   PHART 7.341* 01/20/2015 2201   HCO3 23.2 01/20/2015 2201   TCO2 23 01/21/2015 1615   ACIDBASEDEF 2.0 01/20/2015 2201   O2SAT 96.0 01/20/2015 2201   CBG (last 3)   Recent Labs  01/24/15 1638 01/24/15 2056 01/25/15 0612  GLUCAP 131* 116* 103*    Assessment/Plan: S/P Procedure(s) (LRB): CORONARY ARTERY BYPASS GRAFTING (CABG) X 4 using bilateral internal mammary arteries and left saphenous leg vein  harvested endoscopically. (N/A) TRANSESOPHAGEAL ECHOCARDIOGRAM (TEE) (N/A)  1. CV- hemodynamically stable- continue Coreg, Norvasc, Clonidine, Hydralazine, HCTZ 2. Pulm- no acute issue, continue IS 3. Renal- creatinine trending down, weight is below baseline, can probably d/c Lasix soon 4. Dispo- patient stable, d/c EPW, patient wishes to stay one more day, will monitor HR/BP after wire removal, home in AM   LOS: 13 days    Scott Salinas, Scott Salinas 01/25/2015  Patient seen and examined, agree with above Dc lasix Dc EPW Home this afternoon  Remo Lipps C. Roxan Hockey, MD [facility] [phone]

## 2015-01-25 NOTE — Progress Notes (Signed)
EPW pulled per protocol and as ordered all ends intact pt reminded to lie supine approximately one hour. See flow sheet documentation for vital signs, will continue to monitor patient. Cloer, Bettina Gavia RN

## 2015-01-25 NOTE — Progress Notes (Signed)
[id number] Education completed with pt who voiced understanding. Encouraged IS. Would like walker for home. Discussed CRP 2 and will refer to [place]. Discussed smoking cessation and gave pt fake cigarette and handout. Encouraged heart healthy diet. Graylon Good RN BSN 01/25/2015 12:17 PM

## 2015-01-25 NOTE — Progress Notes (Signed)
CARDIAC REHAB PHASE I   PRE:  Rate/Rhythm: 94 SR    BP: sitting 149/78    SaO2: 98 RA  MODE:  Ambulation: 300 ft   POST:  Rate/Rhythm: 115 ST withPVCs    BP: sitting 161/102     SaO2: 100 RA  Upon arrival and I introduced myselft, pt immediately irate, sts he was told he would walk after EPW pulled. I offered to come back later but pt insisted on walking now. Pt independently got his gown on and walked with RW, "I'm gonna give you your 5 minutes". Pt requested that I not touch him. HR elevated walking with PVCs. BP elevated after walk as well. He did not need O2 to walk. He now sts "he has had enough of this" and wants to go home today. I was unsuccessful in calming pt.   [place], [place], ACSM 01/25/2015 8:52 AM
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
## 2015-02-01 ENCOUNTER — Other Ambulatory Visit: Payer: Self-pay

## 2015-02-01 DIAGNOSIS — G8918 Other acute postprocedural pain: Secondary | ICD-10-CM

## 2015-02-01 MED ORDER — OXYCODONE HCL 5 MG PO TABS: 5.0000 mg | ORAL_TABLET | Freq: Four times a day (QID) | ORAL | Status: DC | PRN

## 2015-02-01 NOTE — Telephone Encounter (Signed)
RX refill for Oxycodone 5 mg # 40 no refills printed out, Patient will pick up at front desk.
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
## 2015-02-11 ENCOUNTER — Encounter: Payer: Self-pay | Admitting: Physician Assistant

## 2015-02-11 ENCOUNTER — Encounter: Payer: BLUE CROSS/BLUE SHIELD | Admitting: Physician Assistant

## 2015-02-15 ENCOUNTER — Other Ambulatory Visit: Payer: Self-pay | Admitting: *Deleted

## 2015-02-15 DIAGNOSIS — G8918 Other acute postprocedural pain: Secondary | ICD-10-CM

## 2015-02-15 MED ORDER — OXYCODONE HCL 5 MG PO TABS
5.0000 mg | ORAL_TABLET | Freq: Four times a day (QID) | ORAL | Status: DC | PRN
Start: 2015-02-15 — End: 2015-02-23

## 2015-02-15 NOTE — Telephone Encounter (Signed)
Scott Salinas has called the office to request a refill for Oxycodone s/p CABG. I informed him that a new signed script would be available at the office today and he agreed.

## 2015-02-17 ENCOUNTER — Encounter: Payer: Self-pay | Admitting: Physician Assistant

## 2015-02-22 ENCOUNTER — Other Ambulatory Visit: Payer: Self-pay | Admitting: Thoracic Surgery (Cardiothoracic Vascular Surgery)

## 2015-02-22 DIAGNOSIS — Z951 Presence of aortocoronary bypass graft: Secondary | ICD-10-CM

## 2015-02-23 ENCOUNTER — Other Ambulatory Visit: Payer: Self-pay | Admitting: *Deleted

## 2015-02-23 ENCOUNTER — Ambulatory Visit
Admission: RE | Admit: 2015-02-23 | Discharge: 2015-02-23 | Disposition: A | Discharge status: Home / Self Care | Payer: BLUE CROSS/BLUE SHIELD | Source: Ambulatory Visit | Attending: Thoracic Surgery (Cardiothoracic Vascular Surgery) | Admitting: Thoracic Surgery (Cardiothoracic Vascular Surgery)

## 2015-02-23 ENCOUNTER — Encounter: Payer: Self-pay | Admitting: Thoracic Surgery (Cardiothoracic Vascular Surgery)

## 2015-02-23 ENCOUNTER — Ambulatory Visit (INDEPENDENT_AMBULATORY_CARE_PROVIDER_SITE_OTHER): Payer: Self-pay | Admitting: Thoracic Surgery (Cardiothoracic Vascular Surgery)

## 2015-02-23 VITALS — BP 164/110 | HR 75 | Resp 16 | Ht 68.0 in | Wt 248.0 lb

## 2015-02-23 DIAGNOSIS — G8918 Other acute postprocedural pain: Secondary | ICD-10-CM

## 2015-02-23 DIAGNOSIS — Z951 Presence of aortocoronary bypass graft: Secondary | ICD-10-CM

## 2015-02-23 DIAGNOSIS — I25110 Atherosclerotic heart disease of native coronary artery with unstable angina pectoris: Secondary | ICD-10-CM

## 2015-02-23 DIAGNOSIS — I10 Essential (primary) hypertension: Secondary | ICD-10-CM

## 2015-02-23 DIAGNOSIS — F17200 Nicotine dependence, unspecified, uncomplicated: Secondary | ICD-10-CM

## 2015-02-23 DIAGNOSIS — I2510 Atherosclerotic heart disease of native coronary artery without angina pectoris: Secondary | ICD-10-CM

## 2015-02-23 DIAGNOSIS — E7800 Pure hypercholesterolemia, unspecified: Secondary | ICD-10-CM

## 2015-02-23 MED ORDER — NICOTINE 14 MG/24HR TD PT24: 14.0000 mg | MEDICATED_PATCH | Freq: Every day | TRANSDERMAL | Status: DC

## 2015-02-23 MED ORDER — ATORVASTATIN CALCIUM 80 MG PO TABS
80.0000 mg | ORAL_TABLET | Freq: Every day | ORAL | Status: DC
Start: 2015-02-23 — End: 2015-07-25

## 2015-02-23 MED ORDER — AMLODIPINE BESYLATE 10 MG PO TABS
10.0000 mg | ORAL_TABLET | Freq: Every day | ORAL | Status: DC
Start: 2015-02-23 — End: 2015-07-25

## 2015-02-23 MED ORDER — ASPIRIN 325 MG PO TBEC: 325.0000 mg | DELAYED_RELEASE_TABLET | Freq: Every day | ORAL | Status: DC

## 2015-02-23 MED ORDER — OXYCODONE HCL 5 MG PO TABS: 5.0000 mg | ORAL_TABLET | Freq: Four times a day (QID) | ORAL | Status: DC | PRN

## 2015-02-23 MED ORDER — HYDRALAZINE HCL 25 MG PO TABS
25.0000 mg | ORAL_TABLET | Freq: Three times a day (TID) | ORAL | Status: DC
Start: 2015-02-23 — End: 2015-02-26
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
## 2015-02-26 ENCOUNTER — Ambulatory Visit (INDEPENDENT_AMBULATORY_CARE_PROVIDER_SITE_OTHER): Payer: BLUE CROSS/BLUE SHIELD | Admitting: Physician Assistant

## 2015-02-26 ENCOUNTER — Telehealth: Payer: Self-pay

## 2015-02-26 ENCOUNTER — Encounter: Payer: Self-pay | Admitting: Physician Assistant

## 2015-02-26 ENCOUNTER — Other Ambulatory Visit: Payer: Self-pay

## 2015-02-26 VITALS — BP 146/108 | HR 78 | Ht 68.0 in | Wt 248.0 lb

## 2015-02-26 DIAGNOSIS — E785 Hyperlipidemia, unspecified: Secondary | ICD-10-CM

## 2015-02-26 DIAGNOSIS — I2510 Atherosclerotic heart disease of native coronary artery without angina pectoris: Secondary | ICD-10-CM | POA: Diagnosis not present

## 2015-02-26 DIAGNOSIS — I10 Essential (primary) hypertension: Secondary | ICD-10-CM

## 2015-02-26 DIAGNOSIS — N182 Chronic kidney disease, stage 2 (mild): Secondary | ICD-10-CM

## 2015-02-26 DIAGNOSIS — I6523 Occlusion and stenosis of bilateral carotid arteries: Secondary | ICD-10-CM

## 2015-02-26 DIAGNOSIS — I255 Ischemic cardiomyopathy: Secondary | ICD-10-CM

## 2015-02-26 DIAGNOSIS — Z951 Presence of aortocoronary bypass graft: Secondary | ICD-10-CM | POA: Diagnosis not present

## 2015-02-26 DIAGNOSIS — R0683 Snoring: Secondary | ICD-10-CM

## 2015-02-26 DIAGNOSIS — Z720 Tobacco use: Secondary | ICD-10-CM

## 2015-02-26 LAB — BASIC METABOLIC PANEL
BUN: 11 mg/dL (ref 7–25)
CHLORIDE: 104 mmol/L (ref 98–110)
CO2: 24 mmol/L (ref 20–31)
CREATININE: 1.22 mg/dL (ref 0.60–1.35)
Calcium: 9.2 mg/dL (ref 8.6–10.3)
Glucose, Bld: 80 mg/dL (ref 65–99)
POTASSIUM: 3.9 mmol/L (ref 3.5–5.3)
Sodium: 138 mmol/L (ref 135–146)

## 2015-02-26 MED ORDER — ASPIRIN EC 81 MG PO TBEC: 81.0000 mg | DELAYED_RELEASE_TABLET | Freq: Every day | ORAL | Status: DC

## 2015-02-26 MED ORDER — HYDRALAZINE HCL 25 MG PO TABS: 25.0000 mg | ORAL_TABLET | Freq: Three times a day (TID) | ORAL | Status: DC

## 2015-02-26 MED ORDER — NICOTINE 14 MG/24HR TD PT24: 14.0000 mg | MEDICATED_PATCH | Freq: Every day | TRANSDERMAL | Status: DC

## 2015-02-26 MED ORDER — CARVEDILOL 12.5 MG PO TABS: 12.5000 mg | ORAL_TABLET | Freq: Two times a day (BID) | ORAL | Status: DC

## 2015-02-26 NOTE — Patient Instructions (Signed)
Medication Instructions:  Your physician has recommended you make the following change in your medication:  1. Discontinue Clonidine\ 2.  Start Asprin ( 81 mg ) daily, was given samples today 3.  Start Coreg ( 12.5 mg ) twice a day 4.  Start Hydralazine ( 25 mg ) three times daily 5.  Start Nicoderm Patch ( 14 mg ) daily All medication has been sent in to the [facility] on [address]: Your physician recommends that you have lab work today: bmet   Testing/Procedures: Your physician has recommended that you have a sleep study. This test records several body functions during sleep, including: brain activity, eye movement, oxygen and carbon dioxide blood levels, heart rate and rhythm, breathing rate and rhythm, the flow of air through your mouth and nose, snoring, body muscle movements, and chest and belly movement. Our office will be calling you to set up appointment.    Follow-Up: Your physician recommends that you keep your scheduled  follow-up appointment on November 7 with Richardson Dopp, PA-C Your physician recommends that you keep your scheduled  follow-up appointment on January 30 with Dr. Meda Coffee   Any Other Special Instructions Will Be Listed Below (If Applicable).  Your provider has signed you up for Cardiac Rehabilitation Program you will be hearing from that office to let you know when you will start.

## 2015-02-26 NOTE — Telephone Encounter (Signed)
Called and spoke with patient about split night sleep study. [facility] had available appt on January 5th at 8pm and patient was agreeable to this time and date. I explained Teri from sleep center was going to mail him a packet. Gave patient sleep center phone number in case he needed to call them ro reschedule appt.

## 2015-03-03 ENCOUNTER — Telehealth: Payer: Self-pay | Admitting: *Deleted

## 2015-03-03 NOTE — Telephone Encounter (Signed)
Pt notified of lab results by phone with verbal understanding.  

## 2015-03-08 ENCOUNTER — Other Ambulatory Visit: Payer: Self-pay

## 2015-03-08 DIAGNOSIS — G8918 Other acute postprocedural pain: Secondary | ICD-10-CM

## 2015-03-08 MED ORDER — OXYCODONE HCL 5 MG PO TABS: 5.0000 mg | ORAL_TABLET | Freq: Four times a day (QID) | ORAL | Status: DC | PRN

## 2015-03-08 NOTE — Telephone Encounter (Signed)
RX refill for Oxycodone 5 mg printed out. Pt will pick up before 1700 today

## 2015-03-15 ENCOUNTER — Ambulatory Visit: Payer: BLUE CROSS/BLUE SHIELD | Admitting: Physician Assistant

## 2015-03-22 ENCOUNTER — Ambulatory Visit (INDEPENDENT_AMBULATORY_CARE_PROVIDER_SITE_OTHER): Payer: BLUE CROSS/BLUE SHIELD | Admitting: Physician Assistant

## 2015-03-22 ENCOUNTER — Encounter: Payer: Self-pay | Admitting: Physician Assistant

## 2015-03-22 VITALS — BP 150/95 | HR 72 | Ht 68.75 in | Wt 250.0 lb

## 2015-03-22 DIAGNOSIS — I10 Essential (primary) hypertension: Secondary | ICD-10-CM | POA: Diagnosis not present

## 2015-03-22 DIAGNOSIS — Z13220 Encounter for screening for lipoid disorders: Secondary | ICD-10-CM | POA: Diagnosis not present

## 2015-03-22 DIAGNOSIS — I25119 Atherosclerotic heart disease of native coronary artery with unspecified angina pectoris: Secondary | ICD-10-CM

## 2015-03-22 DIAGNOSIS — Z951 Presence of aortocoronary bypass graft: Secondary | ICD-10-CM

## 2015-03-22 MED ORDER — CARVEDILOL 25 MG PO TABS: 25.0000 mg | ORAL_TABLET | Freq: Two times a day (BID) | ORAL | Status: DC

## 2015-03-22 MED ORDER — HYDROCHLOROTHIAZIDE 25 MG PO TABS: 25.0000 mg | ORAL_TABLET | Freq: Every day | ORAL | Status: DC

## 2015-03-22 NOTE — Assessment & Plan Note (Signed)
Stable without chest pain. Walking an hour a day without cardiac complaints.
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
## 2015-03-22 NOTE — Patient Instructions (Signed)
Medication Instructions:  Increase Carvedilol to 25 mg twice daily and increase HCTZ to 25 mg once daily  Labwork: Lipids and Liver Function on same day as f/u with scott Weaver, PA-c in 2 to 4 week. Please do not eat or drink after midnight the night before labs are to be drawn.  Testing/Procedures: None  Follow-Up: Your physician recommends that you schedule a follow-up appointment in: 2 to 4 with Richardson Dopp, PA-c for blood pressure management.      If you need a refill on your cardiac medications before your next appointment, please call your pharmacy.

## 2015-03-22 NOTE — Assessment & Plan Note (Signed)
She has blood pressure is elevated today. He did not take any of his medications this morning. I will increase his Coreg to 25 mg twice a day and HCTZ to 25 mg once daily. He will come back to see Richardson Dopp, PA in 2-4 weeks for blood pressure management. Grams sodium diet.
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
## 2015-04-28 ENCOUNTER — Encounter: Payer: BLUE CROSS/BLUE SHIELD | Admitting: Physician Assistant

## 2015-04-28 ENCOUNTER — Other Ambulatory Visit (INDEPENDENT_AMBULATORY_CARE_PROVIDER_SITE_OTHER): Payer: Self-pay

## 2015-04-28 DIAGNOSIS — R0989 Other specified symptoms and signs involving the circulatory and respiratory systems: Secondary | ICD-10-CM

## 2015-04-28 DIAGNOSIS — Z13220 Encounter for screening for lipoid disorders: Secondary | ICD-10-CM

## 2015-05-05 ENCOUNTER — Encounter: Payer: Self-pay | Admitting: Physician Assistant

## 2015-05-13 ENCOUNTER — Ambulatory Visit (HOSPITAL_BASED_OUTPATIENT_CLINIC_OR_DEPARTMENT_OTHER): Payer: Self-pay | Attending: Physician Assistant

## 2015-06-07 ENCOUNTER — Ambulatory Visit (INDEPENDENT_AMBULATORY_CARE_PROVIDER_SITE_OTHER): Payer: PRIVATE HEALTH INSURANCE | Admitting: Cardiology

## 2015-06-07 ENCOUNTER — Encounter: Payer: Self-pay | Admitting: Cardiology

## 2015-06-07 VITALS — BP 140/70 | HR 76 | Ht 68.75 in | Wt 268.0 lb

## 2015-06-07 DIAGNOSIS — E785 Hyperlipidemia, unspecified: Secondary | ICD-10-CM

## 2015-06-07 DIAGNOSIS — I10 Essential (primary) hypertension: Secondary | ICD-10-CM | POA: Diagnosis not present

## 2015-06-07 DIAGNOSIS — Z951 Presence of aortocoronary bypass graft: Secondary | ICD-10-CM

## 2015-06-07 DIAGNOSIS — I25119 Atherosclerotic heart disease of native coronary artery with unspecified angina pectoris: Secondary | ICD-10-CM

## 2015-06-07 DIAGNOSIS — I255 Ischemic cardiomyopathy: Secondary | ICD-10-CM | POA: Diagnosis not present

## 2015-06-07 DIAGNOSIS — I160 Hypertensive urgency: Secondary | ICD-10-CM
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
## 2015-06-07 NOTE — Patient Instructions (Signed)
Medication Instructions:  Same-no changes  Labwork: None  Testing/Procedures: None  Follow-Up: Your physician recommends that you schedule a follow-up appointment in: 3 months      If you need a refill on your cardiac medications before your next appointment, please call your pharmacy.   

## 2015-06-18 ENCOUNTER — Ambulatory Visit (HOSPITAL_COMMUNITY): Payer: Medicaid - Out of State | Attending: Internal Medicine

## 2015-06-18 ENCOUNTER — Other Ambulatory Visit: Payer: Self-pay

## 2015-06-18 DIAGNOSIS — Z6839 Body mass index (BMI) 39.0-39.9, adult: Secondary | ICD-10-CM | POA: Insufficient documentation

## 2015-06-18 DIAGNOSIS — Z951 Presence of aortocoronary bypass graft: Secondary | ICD-10-CM | POA: Diagnosis not present

## 2015-06-18 DIAGNOSIS — I2510 Atherosclerotic heart disease of native coronary artery without angina pectoris: Secondary | ICD-10-CM | POA: Diagnosis not present

## 2015-06-18 DIAGNOSIS — I10 Essential (primary) hypertension: Secondary | ICD-10-CM | POA: Insufficient documentation

## 2015-06-18 DIAGNOSIS — I340 Nonrheumatic mitral (valve) insufficiency: Secondary | ICD-10-CM | POA: Diagnosis not present

## 2015-06-18 DIAGNOSIS — I517 Cardiomegaly: Secondary | ICD-10-CM | POA: Diagnosis not present

## 2015-06-18 DIAGNOSIS — I255 Ischemic cardiomyopathy: Secondary | ICD-10-CM

## 2015-07-24 ENCOUNTER — Inpatient Hospital Stay (HOSPITAL_COMMUNITY)
Admission: EM | Admit: 2015-07-24 | Discharge: 2015-07-30 | DRG: 291 | Disposition: A | Discharge status: Home / Self Care | Payer: Medicaid - Out of State | Attending: Internal Medicine | Admitting: Internal Medicine

## 2015-07-24 ENCOUNTER — Encounter (HOSPITAL_COMMUNITY): Payer: Self-pay | Admitting: *Deleted

## 2015-07-24 DIAGNOSIS — I130 Hypertensive heart and chronic kidney disease with heart failure and stage 1 through stage 4 chronic kidney disease, or unspecified chronic kidney disease: Principal | ICD-10-CM | POA: Diagnosis present

## 2015-07-24 DIAGNOSIS — R1032 Left lower quadrant pain: Secondary | ICD-10-CM | POA: Diagnosis present

## 2015-07-24 DIAGNOSIS — Z955 Presence of coronary angioplasty implant and graft: Secondary | ICD-10-CM

## 2015-07-24 DIAGNOSIS — R109 Unspecified abdominal pain: Secondary | ICD-10-CM

## 2015-07-24 DIAGNOSIS — Z7982 Long term (current) use of aspirin: Secondary | ICD-10-CM

## 2015-07-24 DIAGNOSIS — N183 Chronic kidney disease, stage 3 (moderate): Secondary | ICD-10-CM | POA: Diagnosis present

## 2015-07-24 DIAGNOSIS — R0602 Shortness of breath: Secondary | ICD-10-CM

## 2015-07-24 DIAGNOSIS — I2510 Atherosclerotic heart disease of native coronary artery without angina pectoris: Secondary | ICD-10-CM | POA: Diagnosis present

## 2015-07-24 DIAGNOSIS — I5031 Acute diastolic (congestive) heart failure: Secondary | ICD-10-CM | POA: Diagnosis present

## 2015-07-24 DIAGNOSIS — E876 Hypokalemia: Secondary | ICD-10-CM | POA: Diagnosis not present

## 2015-07-24 DIAGNOSIS — G8929 Other chronic pain: Secondary | ICD-10-CM | POA: Diagnosis present

## 2015-07-24 DIAGNOSIS — M79604 Pain in right leg: Secondary | ICD-10-CM

## 2015-07-24 DIAGNOSIS — I110 Hypertensive heart disease with heart failure: Secondary | ICD-10-CM

## 2015-07-24 DIAGNOSIS — T783XXA Angioneurotic edema, initial encounter: Secondary | ICD-10-CM | POA: Diagnosis present

## 2015-07-24 DIAGNOSIS — F1190 Opioid use, unspecified, uncomplicated: Secondary | ICD-10-CM | POA: Diagnosis present

## 2015-07-24 DIAGNOSIS — I161 Hypertensive emergency: Secondary | ICD-10-CM

## 2015-07-24 DIAGNOSIS — Z951 Presence of aortocoronary bypass graft: Secondary | ICD-10-CM

## 2015-07-24 DIAGNOSIS — I248 Other forms of acute ischemic heart disease: Secondary | ICD-10-CM | POA: Diagnosis present

## 2015-07-24 DIAGNOSIS — I25119 Atherosclerotic heart disease of native coronary artery with unspecified angina pectoris: Secondary | ICD-10-CM | POA: Diagnosis present

## 2015-07-24 DIAGNOSIS — E785 Hyperlipidemia, unspecified: Secondary | ICD-10-CM | POA: Diagnosis present

## 2015-07-24 DIAGNOSIS — F17210 Nicotine dependence, cigarettes, uncomplicated: Secondary | ICD-10-CM | POA: Diagnosis present

## 2015-07-24 DIAGNOSIS — Z9119 Patient's noncompliance with other medical treatment and regimen: Secondary | ICD-10-CM

## 2015-07-24 DIAGNOSIS — F1210 Cannabis abuse, uncomplicated: Secondary | ICD-10-CM | POA: Diagnosis present

## 2015-07-24 DIAGNOSIS — I252 Old myocardial infarction: Secondary | ICD-10-CM

## 2015-07-24 DIAGNOSIS — Z79899 Other long term (current) drug therapy: Secondary | ICD-10-CM

## 2015-07-24 DIAGNOSIS — Z79891 Long term (current) use of opiate analgesic: Secondary | ICD-10-CM

## 2015-07-24 DIAGNOSIS — T464X5A Adverse effect of angiotensin-converting-enzyme inhibitors, initial encounter: Secondary | ICD-10-CM
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
## 2015-07-24 NOTE — ED Notes (Signed)
The pt is c/o lt upper chest  For awhile with sob.  Mi 2006 cabg sept  He stopped taking all his meds 2 months ago

## 2015-07-25 ENCOUNTER — Other Ambulatory Visit (HOSPITAL_COMMUNITY): Payer: PRIVATE HEALTH INSURANCE

## 2015-07-25 ENCOUNTER — Emergency Department (HOSPITAL_COMMUNITY): Payer: Medicaid - Out of State

## 2015-07-25 DIAGNOSIS — I110 Hypertensive heart disease with heart failure: Secondary | ICD-10-CM

## 2015-07-25 DIAGNOSIS — I252 Old myocardial infarction: Secondary | ICD-10-CM | POA: Diagnosis not present

## 2015-07-25 DIAGNOSIS — I5031 Acute diastolic (congestive) heart failure: Secondary | ICD-10-CM | POA: Diagnosis present

## 2015-07-25 DIAGNOSIS — R1032 Left lower quadrant pain: Secondary | ICD-10-CM | POA: Diagnosis present

## 2015-07-25 DIAGNOSIS — Z955 Presence of coronary angioplasty implant and graft: Secondary | ICD-10-CM | POA: Diagnosis not present

## 2015-07-25 DIAGNOSIS — I2510 Atherosclerotic heart disease of native coronary artery without angina pectoris: Secondary | ICD-10-CM | POA: Diagnosis present

## 2015-07-25 DIAGNOSIS — E876 Hypokalemia: Secondary | ICD-10-CM | POA: Diagnosis not present

## 2015-07-25 DIAGNOSIS — F17210 Nicotine dependence, cigarettes, uncomplicated: Secondary | ICD-10-CM | POA: Diagnosis present

## 2015-07-25 DIAGNOSIS — T783XXA Angioneurotic edema, initial encounter: Secondary | ICD-10-CM | POA: Diagnosis present

## 2015-07-25 DIAGNOSIS — I130 Hypertensive heart and chronic kidney disease with heart failure and stage 1 through stage 4 chronic kidney disease, or unspecified chronic kidney disease: Secondary | ICD-10-CM | POA: Diagnosis present

## 2015-07-25 DIAGNOSIS — I25119 Atherosclerotic heart disease of native coronary artery with unspecified angina pectoris: Secondary | ICD-10-CM | POA: Diagnosis present

## 2015-07-25 DIAGNOSIS — I161 Hypertensive emergency: Secondary | ICD-10-CM

## 2015-07-25 DIAGNOSIS — M79604 Pain in right leg: Secondary | ICD-10-CM | POA: Diagnosis present

## 2015-07-25 DIAGNOSIS — I248 Other forms of acute ischemic heart disease: Secondary | ICD-10-CM | POA: Diagnosis present

## 2015-07-25 DIAGNOSIS — E785 Hyperlipidemia, unspecified: Secondary | ICD-10-CM | POA: Diagnosis present

## 2015-07-25 DIAGNOSIS — Z79899 Other long term (current) drug therapy: Secondary | ICD-10-CM | POA: Diagnosis not present

## 2015-07-25 DIAGNOSIS — N183 Chronic kidney disease, stage 3 (moderate): Secondary | ICD-10-CM | POA: Diagnosis present

## 2015-07-25 DIAGNOSIS — Z9119 Patient's noncompliance with other medical treatment and regimen: Secondary | ICD-10-CM | POA: Diagnosis not present

## 2015-07-25 DIAGNOSIS — Z7982 Long term (current) use of aspirin: Secondary | ICD-10-CM | POA: Diagnosis not present

## 2015-07-25 DIAGNOSIS — Z951 Presence of aortocoronary bypass graft: Secondary | ICD-10-CM | POA: Diagnosis not present

## 2015-07-25 DIAGNOSIS — Z79891 Long term (current) use of opiate analgesic: Secondary | ICD-10-CM | POA: Diagnosis not present

## 2015-07-25 DIAGNOSIS — F1210 Cannabis abuse, uncomplicated: Secondary | ICD-10-CM | POA: Diagnosis present

## 2015-07-25 DIAGNOSIS — G8929 Other chronic pain: Secondary | ICD-10-CM | POA: Diagnosis present

## 2015-07-25 LAB — PROTEIN / CREATININE RATIO, URINE
CREATININE, URINE: 69.74 mg/dL
Protein Creatinine Ratio: 2.04 mg/mg{Cre} — ABNORMAL HIGH (ref 0.00–0.15)
Total Protein, Urine: 142 mg/dL

## 2015-07-25 LAB — BASIC METABOLIC PANEL
Anion gap: 14 (ref 5–15)
BUN: 18 mg/dL (ref 6–20)
CHLORIDE: 102 mmol/L (ref 101–111)
CO2: 21 mmol/L — AB (ref 22–32)
CREATININE: 1.53 mg/dL — AB (ref 0.61–1.24)
Calcium: 8.8 mg/dL — ABNORMAL LOW (ref 8.9–10.3)
GFR calc Af Amer: >60 mL/min (ref >60)
GFR calc non Af Amer: 54 mL/min — ABNORMAL LOW (ref >60)
Glucose, Bld: 117 mg/dL — ABNORMAL HIGH (ref 65–99)
POTASSIUM: 3.6 mmol/L (ref 3.5–5.1)
SODIUM: 137 mmol/L (ref 135–145)

## 2015-07-25 LAB — RAPID URINE DRUG SCREEN, HOSP PERFORMED
Amphetamines: NOT DETECTED
BARBITURATES: NOT DETECTED
BENZODIAZEPINES: NOT DETECTED
COCAINE: NOT DETECTED
Opiates: POSITIVE — AB
Tetrahydrocannabinol: POSITIVE — AB

## 2015-07-25 LAB — MRSA PCR SCREENING: MRSA by PCR: NEGATIVE

## 2015-07-25 LAB — CBC
HEMATOCRIT: 43.5 % (ref 39.0–52.0)
Hemoglobin: 14.8 g/dL (ref 13.0–17.0)
MCH: 29.4 pg (ref 26.0–34.0)
MCHC: 34 g/dL (ref 30.0–36.0)
MCV: 86.5 fL (ref 78.0–100.0)
PLATELETS: 230 10*3/uL (ref 150–400)
RBC: 5.03 MIL/uL (ref 4.22–5.81)
RDW: 16.9 % — AB (ref 11.5–15.5)
WBC: 7.5 10*3/uL (ref 4.0–10.5)

## 2015-07-25 LAB — TROPONIN I
TROPONIN I: 0.16 ng/mL — AB (ref <0.031)
Troponin I: 0.13 ng/mL — ABNORMAL HIGH (ref <0.031)
Troponin I: 0.15 ng/mL — ABNORMAL HIGH (ref <0.031)

## 2015-07-25 LAB — BRAIN NATRIURETIC PEPTIDE: B Natriuretic Peptide: 1071.1 pg/mL — ABNORMAL HIGH (ref 0.0–100.0)

## 2015-07-25 MED ORDER — AMLODIPINE BESYLATE 5 MG PO TABS
5.0000 mg | ORAL_TABLET | ORAL | Status: AC
  Administered 2015-07-25: 5 mg via ORAL
  Filled 2015-07-25: qty 1

## 2015-07-25 MED ORDER — ASPIRIN 81 MG PO CHEW
324.0000 mg | CHEWABLE_TABLET | Freq: Once | ORAL | Status: DC
  Filled 2015-07-25: qty 4

## 2015-07-25 MED ORDER — FUROSEMIDE 10 MG/ML IJ SOLN
40.0000 mg | Freq: Once | INTRAMUSCULAR | Status: AC
  Administered 2015-07-25: 40 mg via INTRAVENOUS
  Filled 2015-07-25: qty 4

## 2015-07-25 MED ORDER — TRAMADOL HCL 50 MG PO TABS
50.0000 mg | ORAL_TABLET | Freq: Once | ORAL | Status: AC
  Administered 2015-07-26: 50 mg via ORAL
  Filled 2015-07-25: qty 1

## 2015-07-25 MED ORDER — SODIUM CHLORIDE 0.9 % IV SOLN: 250.0000 mL | INTRAVENOUS | Status: DC | PRN

## 2015-07-25 MED ORDER — HYDRALAZINE HCL 20 MG/ML IJ SOLN
10.0000 mg | INTRAMUSCULAR | Status: DC | PRN
Start: 2015-07-25 — End: 2015-07-30
  Administered 2015-07-25 – 2015-07-26 (×4): 10 mg via INTRAVENOUS
  Filled 2015-07-25 (×4): qty 1

## 2015-07-25 MED ORDER — CARVEDILOL 12.5 MG PO TABS
12.5000 mg | ORAL_TABLET | Freq: Two times a day (BID) | ORAL | Status: DC
  Administered 2015-07-25: 12.5 mg via ORAL
  Filled 2015-07-25: qty 1

## 2015-07-25 MED ORDER — ONDANSETRON HCL 4 MG/2ML IJ SOLN
4.0000 mg | Freq: Once | INTRAMUSCULAR | Status: AC
  Administered 2015-07-25: 4 mg via INTRAVENOUS
  Filled 2015-07-25: qty 2

## 2015-07-25 MED ORDER — NITROGLYCERIN 0.4 MG SL SUBL
0.4000 mg | SUBLINGUAL_TABLET | SUBLINGUAL | Status: DC | PRN
  Administered 2015-07-25 (×2): 0.4 mg via SUBLINGUAL
  Filled 2015-07-25: qty 1

## 2015-07-25 MED ORDER — FUROSEMIDE 10 MG/ML IJ SOLN
60.0000 mg | Freq: Two times a day (BID) | INTRAMUSCULAR | Status: DC
  Administered 2015-07-25: 60 mg via INTRAVENOUS
  Filled 2015-07-25: qty 6

## 2015-07-25 MED ORDER — CARVEDILOL 6.25 MG PO TABS
6.2500 mg | ORAL_TABLET | Freq: Two times a day (BID) | ORAL | Status: DC
  Administered 2015-07-25: 6.25 mg via ORAL
  Filled 2015-07-25: qty 1

## 2015-07-25 MED ORDER — FUROSEMIDE 10 MG/ML IJ SOLN
60.0000 mg | Freq: Every day | INTRAMUSCULAR | Status: DC
  Administered 2015-07-26 – 2015-07-30 (×5): 60 mg via INTRAVENOUS
  Filled 2015-07-25 (×5): qty 6

## 2015-07-25 MED ORDER — SODIUM CHLORIDE 0.9% FLUSH
3.0000 mL | Freq: Two times a day (BID) | INTRAVENOUS | Status: DC
Start: 2015-07-25 — End: 2015-07-30
  Administered 2015-07-25 – 2015-07-30 (×9): 3 mL via INTRAVENOUS

## 2015-07-25 MED ORDER — MORPHINE SULFATE (PF) 4 MG/ML IV SOLN
4.0000 mg | Freq: Once | INTRAVENOUS | Status: AC
  Administered 2015-07-25: 4 mg via INTRAVENOUS
  Filled 2015-07-25: qty 1

## 2015-07-25 MED ORDER — ENOXAPARIN SODIUM 40 MG/0.4ML ~~LOC~~ SOLN
40.0000 mg | Freq: Every day | SUBCUTANEOUS | Status: DC
  Administered 2015-07-26 – 2015-07-30 (×5): 40 mg via SUBCUTANEOUS
  Filled 2015-07-25 (×6): qty 0.4

## 2015-07-25 MED ORDER — ACETAMINOPHEN 325 MG PO TABS
650.0000 mg | ORAL_TABLET | ORAL | Status: DC | PRN
  Administered 2015-07-25: 650 mg via ORAL
  Filled 2015-07-25: qty 2

## 2015-07-25 MED ORDER — AMLODIPINE BESYLATE 5 MG PO TABS
5.0000 mg | ORAL_TABLET | Freq: Every day | ORAL | Status: DC
  Administered 2015-07-25: 5 mg via ORAL
  Filled 2015-07-25: qty 1

## 2015-07-25 MED ORDER — AMLODIPINE BESYLATE 10 MG PO TABS
10.0000 mg | ORAL_TABLET | Freq: Every day | ORAL | Status: DC
  Administered 2015-07-26 – 2015-07-30 (×5): 10 mg via ORAL
  Filled 2015-07-25 (×5): qty 1

## 2015-07-25 MED ORDER — LORAZEPAM 2 MG/ML IJ SOLN
1.0000 mg | Freq: Once | INTRAMUSCULAR | Status: AC
  Administered 2015-07-25: 1 mg via INTRAVENOUS
  Filled 2015-07-25: qty 1

## 2015-07-25 MED ORDER — SODIUM CHLORIDE 0.9% FLUSH: 3.0000 mL | INTRAVENOUS | Status: DC | PRN

## 2015-07-25 MED ORDER — ACETAMINOPHEN 325 MG PO TABS
325.0000 mg | ORAL_TABLET | ORAL | Status: DC | PRN
  Administered 2015-07-25 (×2): 325 mg via ORAL
  Filled 2015-07-25 (×2): qty 1

## 2015-07-25 MED ORDER — ASPIRIN EC 81 MG PO TBEC
81.0000 mg | DELAYED_RELEASE_TABLET | Freq: Every day | ORAL | Status: DC
  Administered 2015-07-25 – 2015-07-30 (×6): 81 mg via ORAL
  Filled 2015-07-25 (×6): qty 1

## 2015-07-25 MED ORDER — MORPHINE SULFATE (PF) 2 MG/ML IV SOLN
2.0000 mg | Freq: Once | INTRAVENOUS | Status: AC
  Administered 2015-07-25: 2 mg via INTRAVENOUS
  Filled 2015-07-25: qty 1

## 2015-07-25 MED ORDER — NITROGLYCERIN IN D5W 200-5 MCG/ML-% IV SOLN
0.0000 ug/min | Freq: Once | INTRAVENOUS | Status: AC
  Administered 2015-07-25: 5 ug/min via INTRAVENOUS
  Filled 2015-07-25: qty 250

## 2015-07-25 MED ORDER — ONDANSETRON HCL 4 MG/2ML IJ SOLN: 4.0000 mg | Freq: Four times a day (QID) | INTRAMUSCULAR | Status: DC | PRN

## 2015-07-25 MED ORDER — ATORVASTATIN CALCIUM 40 MG PO TABS
40.0000 mg | ORAL_TABLET | Freq: Every day | ORAL | Status: DC
  Administered 2015-07-25 – 2015-07-29 (×5): 40 mg via ORAL
  Filled 2015-07-25 (×5): qty 1

## 2015-07-25 NOTE — Progress Notes (Signed)
Voided now over 2 l after Lasix 60 mg IV now c/o leg cramps latest BP better 140/106 taken @ 1200 noon

## 2015-07-25 NOTE — ED Notes (Signed)
MD made aware of pt exceeding daily limit of ASA

## 2015-07-25 NOTE — Progress Notes (Signed)
Pt Asking for another food tray - called service response and gave pt the phone to order what he wants - Visitor asking for pillow and blanket - given

## 2015-07-25 NOTE — Progress Notes (Addendum)
Pt has a visitor now  -both declined to answer how visitor is related to pt if at all. Pt says no one is listening to me. I only have chest pain because I had open heart surgery in Sept and I'm sore a lot. - " They told me I'd be sore" I have a headache from that "damn Nitro" they been running on me in emergency. Pt now c/o leg cramps - standing at bedside to help relieve cramps in legs. Troponin now trending down. Troponin  0655- 0.16 than  @ 1153 0.13  NSR with frequent PAC's

## 2015-07-25 NOTE — Progress Notes (Signed)
Called by nursing for persistently elevated blood pressure and headache. He is intolerant to nitrates. Home medications were restarted and bp is improved >25% (goal at 24 hours). Review of echo from 06/2015 shows EF has improved back to normal. Will give additional amlodipine 5 mg now. Increase to 10 mg daily. Increase coreg to 12.5 mg BID starting tonight. Hold pm lasix dose and decrease to 60 mg IV daily starting tomorrow morning.  Pixie Casino, MD, [facility] Attending Cardiologist [place]

## 2015-07-25 NOTE — Progress Notes (Signed)
D/C Nitro Gtt no order now to continue

## 2015-07-25 NOTE — ED Notes (Signed)
Pt refuses to take third nitro, pain still 10/10, pt c/o SOB and demanding o2 with stats at 98%, pt placed on 1L

## 2015-07-25 NOTE — ED Notes (Signed)
Attempted report 

## 2015-07-25 NOTE — Progress Notes (Signed)
BP now 150/114 Pt says he does not have a headache now

## 2015-07-25 NOTE — Progress Notes (Addendum)
BP 168/124 Hydralazine 10 mg given IV  Pt c/o headache denies any visual disturbances

## 2015-07-25 NOTE — Progress Notes (Signed)
Pt received from ED per stretcher. CHG bath done, MRSA swab, Oriented to room. NTG gtt at 60 mcg/min.

## 2015-07-25 NOTE — ED Notes (Addendum)
Pt denies CP, c/o headache, pt insists nitro drip be turned off.

## 2015-07-25 NOTE — ED Notes (Signed)
Attempted report.  Spoke with our Charge.

## 2015-07-25 NOTE — ED Notes (Signed)
Spoke with admitting provider, verbal order to give PO BP medications now, and extra dose of Hydralazine.

## 2015-07-25 NOTE — Progress Notes (Signed)
Pt's BP @ 1400 168/136 Call to [facility] to let him know that Troponins are trending down. BP still high - Pt C/o headache PRN Hydralazine due next at 1550 & reviewed Meds with MD.  MD states he will also review BP's and meds and make changes if necessary. MD made aware that Pt is having leg cramps - received Lasix 60 mg IV and put out > 2.5 liters. Next dose due at 6 pm.

## 2015-07-25 NOTE — Progress Notes (Signed)
Attempted report from ED - phone just kept ringing - called ED actual number
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
## 2015-07-25 NOTE — Progress Notes (Signed)
Pt given Norvasc 5 mg tablet po now.

## 2015-07-25 NOTE — ED Notes (Signed)
Admitting at bedside 

## 2015-07-26 DIAGNOSIS — F1190 Opioid use, unspecified, uncomplicated: Secondary | ICD-10-CM | POA: Diagnosis present

## 2015-07-26 DIAGNOSIS — R109 Unspecified abdominal pain: Secondary | ICD-10-CM

## 2015-07-26 DIAGNOSIS — E876 Hypokalemia: Secondary | ICD-10-CM

## 2015-07-26 DIAGNOSIS — M79604 Pain in right leg: Secondary | ICD-10-CM

## 2015-07-26 HISTORY — DX: Hypokalemia: E87.6

## 2015-07-26 LAB — BASIC METABOLIC PANEL
Anion gap: 9 (ref 5–15)
BUN: 15 mg/dL (ref 6–20)
CALCIUM: 8.8 mg/dL — AB (ref 8.9–10.3)
CHLORIDE: 102 mmol/L (ref 101–111)
CO2: 26 mmol/L (ref 22–32)
CREATININE: 1.6 mg/dL — AB (ref 0.61–1.24)
GFR calc Af Amer: 59 mL/min — ABNORMAL LOW (ref >60)
GFR calc non Af Amer: 51 mL/min — ABNORMAL LOW (ref >60)
GLUCOSE: 108 mg/dL — AB (ref 65–99)
Potassium: 3.4 mmol/L — ABNORMAL LOW (ref 3.5–5.1)
Sodium: 137 mmol/L (ref 135–145)

## 2015-07-26 MED ORDER — HYDRALAZINE HCL 25 MG PO TABS
25.0000 mg | ORAL_TABLET | Freq: Three times a day (TID) | ORAL | Status: DC
  Administered 2015-07-26 – 2015-07-28 (×7): 25 mg via ORAL
  Filled 2015-07-26 (×7): qty 1

## 2015-07-26 MED ORDER — CARVEDILOL 25 MG PO TABS
25.0000 mg | ORAL_TABLET | Freq: Two times a day (BID) | ORAL | Status: DC
  Administered 2015-07-26 – 2015-07-30 (×8): 25 mg via ORAL
  Filled 2015-07-26 (×8): qty 1

## 2015-07-26 MED ORDER — HYDROCODONE-ACETAMINOPHEN 7.5-325 MG PO TABS
1.0000 | ORAL_TABLET | Freq: Three times a day (TID) | ORAL | Status: DC
  Administered 2015-07-26 – 2015-07-27 (×4): 1 via ORAL
  Filled 2015-07-26 (×4): qty 1

## 2015-07-26 MED ORDER — MAGNESIUM HYDROXIDE 400 MG/5ML PO SUSP
30.0000 mL | Freq: Every day | ORAL | Status: DC | PRN
  Administered 2015-07-26: 30 mL via ORAL
  Filled 2015-07-26 (×3): qty 30

## 2015-07-26 MED ORDER — POTASSIUM CHLORIDE CRYS ER 20 MEQ PO TBCR
40.0000 meq | EXTENDED_RELEASE_TABLET | Freq: Every day | ORAL | Status: DC
  Administered 2015-07-26 – 2015-07-30 (×5): 40 meq via ORAL
  Filled 2015-07-26 (×5): qty 2
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
## 2015-07-27 LAB — BASIC METABOLIC PANEL
ANION GAP: 12 (ref 5–15)
BUN: 18 mg/dL (ref 6–20)
CALCIUM: 8.8 mg/dL — AB (ref 8.9–10.3)
CO2: 26 mmol/L (ref 22–32)
CREATININE: 1.62 mg/dL — AB (ref 0.61–1.24)
Chloride: 101 mmol/L (ref 101–111)
GFR calc Af Amer: 58 mL/min — ABNORMAL LOW (ref >60)
GFR calc non Af Amer: 50 mL/min — ABNORMAL LOW (ref >60)
GLUCOSE: 101 mg/dL — AB (ref 65–99)
Potassium: 3.9 mmol/L (ref 3.5–5.1)
Sodium: 139 mmol/L (ref 135–145)

## 2015-07-27 MED ORDER — HYDROCODONE-ACETAMINOPHEN 10-325 MG PO TABS
1.0000 | ORAL_TABLET | Freq: Four times a day (QID) | ORAL | Status: DC | PRN
  Administered 2015-07-27 – 2015-07-29 (×6): 1 via ORAL
  Filled 2015-07-27 (×6): qty 1
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
## 2015-07-28 DIAGNOSIS — T464X5A Adverse effect of angiotensin-converting-enzyme inhibitors, initial encounter: Secondary | ICD-10-CM

## 2015-07-28 DIAGNOSIS — T783XXA Angioneurotic edema, initial encounter: Secondary | ICD-10-CM

## 2015-07-28 LAB — BASIC METABOLIC PANEL
ANION GAP: 5 (ref 5–15)
BUN: 15 mg/dL (ref 6–20)
CHLORIDE: 101 mmol/L (ref 101–111)
CO2: 29 mmol/L (ref 22–32)
CREATININE: 1.73 mg/dL — AB (ref 0.61–1.24)
Calcium: 8.6 mg/dL — ABNORMAL LOW (ref 8.9–10.3)
GFR calc non Af Amer: 46 mL/min — ABNORMAL LOW (ref >60)
GFR, EST AFRICAN AMERICAN: 54 mL/min — AB (ref >60)
Glucose, Bld: 127 mg/dL — ABNORMAL HIGH (ref 65–99)
POTASSIUM: 3.9 mmol/L (ref 3.5–5.1)
SODIUM: 135 mmol/L (ref 135–145)

## 2015-07-28 MED ORDER — HYDRALAZINE HCL 50 MG PO TABS
50.0000 mg | ORAL_TABLET | Freq: Three times a day (TID) | ORAL | Status: DC
  Administered 2015-07-28 – 2015-07-30 (×5): 50 mg via ORAL
  Filled 2015-07-28 (×5): qty 1

## 2015-07-28 NOTE — Progress Notes (Signed)
    Subjective:  No chest pain or shortness of breath.  Objective:  Vital Signs in the last 24 hours: Temp:  [97.4 F (36.3 C)-98 F (36.7 C)] 97.7 F (36.5 C) (03/22 1545) Pulse Rate:  [66-92] 66 (03/22 1545) Resp:  [12-23] 13 (03/22 1545) BP: (130-156)/(92-114) 138/92 mmHg (03/22 1545) SpO2:  [94 %-98 %] 98 % (03/22 1545) Weight:  [253 lb 1.6 oz (114.805 kg)] 253 lb 1.6 oz (114.805 kg) (03/22 0500)  Intake/Output from previous day: 03/21 0701 - 03/22 0700 In: 1443 [P.O.:1440; I.V.:3] Out: 2225 [Urine:2225]  Physical Exam: Pt is alert and oriented, NAD HEENT: normal Neck: JVP - normal Lungs: CTA bilaterally CV: RRR without murmur or gallop Abd: soft, NT, Positive BS, no hepatomegaly Ext: no C/C/E, distal pulses intact and equal Skin: warm/dry no rash   Lab Results: No results for input(s): WBC, HGB, PLT in the last 72 hours.  Recent Labs  07/27/15 0310 07/28/15 0300  NA 139 135  K 3.9 3.9  CL 101 101  CO2 26 29  GLUCOSE 101* 127*  BUN 18 15  CREATININE 1.62* 1.73*   No results for input(s): TROPONINI in the last 72 hours.  Invalid input(s): CK, MB  Cardiac Studies: 2-D echocardiogram 06/18/2015: Study Conclusions  - Left ventricle: The cavity size was moderately dilated. Wall  thickness was increased in a pattern of mild LVH. Systolic  function was normal. The estimated ejection fraction was in the  range of 55% to 60%. Features are consistent with a pseudonormal  left ventricular filling pattern, with concomitant abnormal  relaxation and increased filling pressure (grade 2 diastolic  dysfunction). Doppler parameters are consistent with high  ventricular filling pressure. - Mitral valve: There was mild regurgitation. - Left atrium: The atrium was mildly dilated.   Tele: Normal sinus rhythm  Assessment/Plan:  1. Acute diastolic heart failure: Secondary to hypertensive emergency. The patient is noncompliant. He is back on  antihypertensive medications and his blood pressure control is improving. I am going to increase hydralazine to 50 mg 3 times a day. He will continue on carvedilol 25 mg twice a day and amlodipine 10 mg daily. Further diuresis is not indicated at this time as he does not appear volume overloaded.  2. Minimal troponin elevation: Likely related to hypertensive emergency/CHF  3. Coronary artery disease, status post CABG: No ischemic symptoms at present  4. Chronic narcotic use: The patient is going to return to [facility] at discharge. He does not wish to follow-up with pain management. He takes oxycodone as needed for pain. Anticipate discharge tomorrow and if needed will give him 1 week supply of oxycodone without refills to transition him back home.  Sherren Mocha, M.D. 07/28/2015, 4:24 PM

## 2015-07-29 ENCOUNTER — Inpatient Hospital Stay (HOSPITAL_COMMUNITY): Payer: Medicaid - Out of State

## 2015-07-29 MED ORDER — ALPRAZOLAM 0.5 MG PO TABS
0.5000 mg | ORAL_TABLET | Freq: Two times a day (BID) | ORAL | Status: DC | PRN
  Administered 2015-07-29 – 2015-07-30 (×3): 0.5 mg via ORAL
  Filled 2015-07-29 (×4): qty 1

## 2015-07-29 MED ORDER — CLONIDINE HCL ER 0.1 MG PO TB12
0.1000 mg | ORAL_TABLET | Freq: Two times a day (BID) | ORAL | Status: DC
  Administered 2015-07-29 – 2015-07-30 (×3): 0.1 mg via ORAL
  Filled 2015-07-29 (×5): qty 1

## 2015-07-29 MED ORDER — HYDROCODONE-ACETAMINOPHEN 10-325 MG PO TABS
1.0000 | ORAL_TABLET | ORAL | Status: DC | PRN
  Administered 2015-07-29 – 2015-07-30 (×5): 1 via ORAL
  Filled 2015-07-29 (×6): qty 1

## 2015-07-29 MED ORDER — DIPHENHYDRAMINE-ZINC ACETATE 2-0.1 % EX CREA
TOPICAL_CREAM | Freq: Every day | CUTANEOUS | Status: DC | PRN
  Administered 2015-07-29: 12:00:00 via TOPICAL
  Filled 2015-07-29: qty 28

## 2015-07-29 NOTE — Plan of Care (Signed)
Problem: Activity: Goal: Capacity to carry out activities will improve Outcome: Progressing Pt ambulated in hall 150 ft, independent. No oxygen, O2 sats dropped to 97%, upon return to the room pt states he was feeling "out of oxygen,  I need to be sent home with O2" rechecked pulse ox, sating 99% room air. Pt still demonstrating frustration and anxiety towards having to stay in the hospital, wanting to go home then stating "hes going to die", pt reassured. Will continue to monitor.

## 2015-07-29 NOTE — Progress Notes (Signed)
Pt refuses to walk at this time, pt states he will put the pulse ox on himself when he is ready. Pt instructed to not go past the elevators. Pt agitated at this time, wants to discharge home. Frustrated with IV, BP cuff, itching,vitals. Will continue to monitor.
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
## 2015-07-30 ENCOUNTER — Encounter (HOSPITAL_COMMUNITY): Payer: Self-pay | Admitting: Student

## 2015-07-30 ENCOUNTER — Telehealth: Payer: Self-pay

## 2015-07-30 LAB — BASIC METABOLIC PANEL
ANION GAP: 7 (ref 5–15)
BUN: 21 mg/dL — ABNORMAL HIGH (ref 6–20)
CHLORIDE: 103 mmol/L (ref 101–111)
CO2: 28 mmol/L (ref 22–32)
CREATININE: 1.66 mg/dL — AB (ref 0.61–1.24)
Calcium: 8.7 mg/dL — ABNORMAL LOW (ref 8.9–10.3)
GFR calc non Af Amer: 49 mL/min — ABNORMAL LOW (ref >60)
GFR, EST AFRICAN AMERICAN: 56 mL/min — AB (ref >60)
Glucose, Bld: 112 mg/dL — ABNORMAL HIGH (ref 65–99)
POTASSIUM: 3.7 mmol/L (ref 3.5–5.1)
SODIUM: 138 mmol/L (ref 135–145)

## 2015-07-30 MED ORDER — CLONIDINE HCL ER 0.1 MG PO TB12: 0.1000 mg | ORAL_TABLET | Freq: Two times a day (BID) | ORAL | Status: DC

## 2015-07-30 MED ORDER — ATORVASTATIN CALCIUM 40 MG PO TABS: 40.0000 mg | ORAL_TABLET | Freq: Every day | ORAL | Status: DC

## 2015-07-30 MED ORDER — HYDRALAZINE HCL 50 MG PO TABS: 50.0000 mg | ORAL_TABLET | Freq: Three times a day (TID) | ORAL | Status: DC

## 2015-07-30 MED ORDER — FUROSEMIDE 40 MG PO TABS: 40.0000 mg | ORAL_TABLET | Freq: Every day | ORAL | Status: DC

## 2015-07-30 MED ORDER — AMLODIPINE BESYLATE 10 MG PO TABS: 10.0000 mg | ORAL_TABLET | Freq: Every day | ORAL | Status: DC

## 2015-07-30 MED ORDER — POTASSIUM CHLORIDE CRYS ER 20 MEQ PO TBCR: 20.0000 meq | EXTENDED_RELEASE_TABLET | Freq: Every day | ORAL | Status: DC

## 2015-07-30 MED ORDER — NITROGLYCERIN 0.4 MG SL SUBL: 0.4000 mg | SUBLINGUAL_TABLET | SUBLINGUAL | Status: DC | PRN

## 2015-07-30 MED ORDER — HYDROCODONE-ACETAMINOPHEN 10-325 MG PO TABS: 1.0000 | ORAL_TABLET | Freq: Four times a day (QID) | ORAL | Status: DC | PRN

## 2015-07-30 MED ORDER — CARVEDILOL 25 MG PO TABS: 25.0000 mg | ORAL_TABLET | Freq: Two times a day (BID) | ORAL | Status: DC

## 2015-07-30 NOTE — Discharge Summary (Signed)
Discharge Summary    Patient ID: Scott Salinas,  MRN: [id number], DOB/AGE: 1971-02-09 45 y.o.  Admit date: 07/24/2015 Discharge date: 07/30/2015  Primary Care Provider: PROVIDER NOT [place] Primary Cardiologist: Dr. Meda Coffee  Discharge Diagnoses    Active Problems:   S/P CABG x 4   Hypertensive emergency   Heart failure due to high blood pressure (HCC)   Demand ischemia (HCC)   CAD (coronary artery disease)   Hypokalemia   Chronic narcotic use   Abdominal pain   Leg pain, right   Angiotensin converting enzyme inhibitor-aggravated angioedema   History of Present Illness     Scott Salinas is a 45 y.o. male with past medical history of CAD (s/p CABGx4 w/ LIMA-LAD, RIMA-OM1, sequential SVG-D2/D3 in 01/2015), HTN, HLD, Stage 3 CKD, substance abuse (THC) and medical noncompliance who presented to [facility] ED on 07/25/2015 foe worsening shortness of breath and chest discomfort. He reported having not taken any of his medications for over two months. He now resides in [place] but has been in [place] visiting family members.   Hospital Course     Consultants: None  While in the ED, his BP was elevated to greater than 200. He was placed on a NTG drip initially but this was discontinued secondary to his headache and he was started on PO Coreg, Amlodipine, and IV PRN Hydralazine. He said his chest pain has been chronic since his surgery in 01/2015 and he has been on Oxycodone since the surgery due to his pain. Cyclic troponin values were obtained and showed a flat plateau trend with a peak of 0.16, thought to be secondary to demand ischemia in the setting of his hypertensive urgency.   His CXR at time of admission also showed cardiomegaly and pulmonary edema, therefore he was started on IV Lasix. He had a net output of -10.4L throughout the admission and reported significant improvement in his respiratory status. Prior to discharge, he was switched to PO Lasix 40mg  daily.   His BP  medications were titrated effectively and his BP has improved to 120's -130's/ 80's - 90's at the time of discharge. He will be discharged on the medications listed below.    Throughout the admission, one of his main concerns was the chronic chest pain he experiences. He had been on Oxycodone prior to admission and was obtaining this illegally, for he has not been followed by a medical provider regularly since moving to [place] over two months ago. We offered to arrange a Pain Management referral as an outpatient but he declined and said he was moving back to [place] as soon as he was discharged. This was thoroughly dicussed between the patient, Dr. Burt Knack, and Dr. Ron Parker who were in agreement to give the patient 1 week of pain medication. He is fully aware he will not be able to obtain refills of this medication from Cardiology following discharge.   The patient was last examined by Dr. Ron Parker and deemed stable for discharge. At the time of discharge, he said he will actually be staying with his daughter in [place] for the time being. Therefore, a Cardiology follow-up appointment was arranged. He was informed to contact the office if he does end up relocating prior to the appointment.    Discharge Vitals Blood pressure 128/91, pulse 64, temperature 98.1 F (36.7 C), temperature source Oral, resp. rate 16, height 5\' 10"  (1.778 m), weight 253 lb 4.8 oz (114.896 kg), SpO2 97 %.  Filed Weights  07/28/15 2021 07/29/15 0519 07/30/15 0430  Weight: 253 lb 4.8 oz (114.896 kg) 254 lb 11.2 oz (115.531 kg) 253 lb 4.8 oz (114.896 kg)    Labs & Radiologic Studies     CBC No results for input(s): WBC, NEUTROABS, HGB, HCT, MCV, PLT in the last 72 hours. Basic Metabolic Panel  Recent Labs  07/28/15 0300 07/30/15 0610  NA 135 138  K 3.9 3.7  CL 101 103  CO2 29 28  GLUCOSE 127* 112*  BUN 15 21*  CREATININE 1.73* 1.66*  CALCIUM 8.6* 8.7*    Dg Chest 2 View: 07/29/2015  CLINICAL DATA:  Shortness of  breath, worsening today. EXAM: CHEST  2 VIEW COMPARISON:  07/25/2015 FINDINGS: Prior CABG. Cardiac and mediastinal margins appear normal. There is mild blunting of the right posterior costophrenic angle. Is the lungs appear otherwise clear. IMPRESSION: 1. Mild blunting of the right posterior costophrenic angle suggesting trace right pleural effusion. Otherwise, no significant abnormalities are observed. Electronically Signed   By: Van Clines M.D.   On: 07/29/2015 09:00   Dg Chest Portable 1 View: 07/25/2015  CLINICAL DATA:  Pt states that he has had left sided chest pain and SOB onset today. Pt had CABG x4 in September and stents in 2006. Pt has HTN but states that he hasn't taken his medication in 2 months. EXAM: PORTABLE CHEST 1 VIEW COMPARISON:  None. FINDINGS: Status post median sternotomy and CABG. Heart is enlarged. There is mild perihilar opacity consistent with mild edema. No pleural effusions. IMPRESSION: Cardiomegaly and mild pulmonary edema. Electronically Signed   By: Nolon Nations M.D.   On: 07/25/2015 00:14    Diagnostic Studies/Procedures    Echocardiogram: 06/18/2015 Study Conclusions - Left ventricle: The cavity size was moderately dilated. Wall  thickness was increased in a pattern of mild LVH. Systolic  function was normal. The estimated ejection fraction was in the  range of 55% to 60%. Features are consistent with a pseudonormal  left ventricular filling pattern, with concomitant abnormal  relaxation and increased filling pressure (grade 2 diastolic  dysfunction). Doppler parameters are consistent with high  ventricular filling pressure. - Mitral valve: There was mild regurgitation. - Left atrium: The atrium was mildly dilated.   Disposition   Pt is being discharged home today in good condition.  Follow-up Plans & Appointments    Follow-up Information    Follow up with [facility] R, NP On 08/12/2015.   Specialties:  Cardiology, Radiology   Why:   Cardiology Follow-Up on 08/12/2015 at 11:00AM.   Contact information:   [place] [address] [phone]      Discharge Instructions    Diet - low sodium heart healthy    Complete by:  As directed      Increase activity slowly    Complete by:  As directed            Discharge Medications   Current Discharge Medication List    START taking these medications   Details  amLODipine (NORVASC) 10 MG tablet Take 1 tablet (10 mg total) by mouth daily. Qty: 30 tablet, Refills: 3    atorvastatin (LIPITOR) 40 MG tablet Take 1 tablet (40 mg total) by mouth daily at 6 PM. Qty: 30 tablet, Refills: 3    carvedilol (COREG) 25 MG tablet Take 1 tablet (25 mg total) by mouth 2 (two) times daily with a meal. Qty: 60 tablet, Refills: 3    cloNIDine HCl (KAPVAY) 0.1 MG  TB12 ER tablet Take 1 tablet (0.1 mg total) by mouth 2 (two) times daily. Qty: 60 tablet, Refills: 3    furosemide (LASIX) 40 MG tablet Take 1 tablet (40 mg total) by mouth daily. Qty: 30 tablet, Refills: 3    hydrALAZINE (APRESOLINE) 50 MG tablet Take 1 tablet (50 mg total) by mouth every 8 (eight) hours. Qty: 90 tablet, Refills: 3    nitroGLYCERIN (NITROSTAT) 0.4 MG SL tablet Place 1 tablet (0.4 mg total) under the tongue every 5 (five) minutes as needed for chest pain. Qty: 25 tablet, Refills: 0    potassium chloride SA (K-DUR,KLOR-CON) 20 MEQ tablet Take 1 tablet (20 mEq total) by mouth daily. Take with your Lasix. Qty: 30 tablet, Refills: 3      CONTINUE these medications which have CHANGED   Details  HYDROcodone-acetaminophen (NORCO) 10-325 MG tablet Take 1 tablet by mouth every 6 (six) hours as needed for moderate pain. Qty: 30 tablet, Refills: 0      CONTINUE these medications which have NOT CHANGED   Details  acetaminophen (TYLENOL) 325 MG tablet Take 1 tablet (325 mg total) by mouth every 4 (four) hours as needed for mild pain, moderate pain or headache. Qty: 30 tablet, Refills: 0     aspirin EC 81 MG tablet Take 1 tablet (81 mg total) by mouth daily. Qty: 30 tablet, Refills: 11   Associated Diagnoses: Essential hypertension; S/P CABG x 4          Allergies Allergies  Allergen Reactions  . Lisinopril Anaphylaxis    Whole right side of face swelled.      Outstanding Labs/Studies   None  Duration of Discharge Encounter   Greater than 30 minutes including physician time.  Signed, Erma Heritage, PA-C 07/30/2015, 9:51 AM Patient seen and examined. I agree with the assessment and plan as detailed above. See also my additional thoughts below.   Patient is ready for discharge. I made decision for discharge. I agree with th note above and plans.  Dola Argyle, MD, [facility] 08/04/2015 2:42 PM

## 2015-07-30 NOTE — Telephone Encounter (Signed)
Yes, that is what Dr. Ron Parker had him on. I saw him only in the context of doing his discharge summary.   If it is too expensive or he cannot afford the XR, he can be switched to regular immediate release Clonidine 0.1mg  BID.   Signed, Erma Heritage, PA-C 07/30/2015, 3:56 PM Pager: [phone]
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
## 2015-07-30 NOTE — Progress Notes (Signed)
Discharge instructions discussed and Prescriptions given to pt. Denies any barriers to being unable to obtain prescriptions at his Pharmacy. States his ride for discharge is available and downstairs waiting for him. Pt requests Narcotic pain med to be given at this time. Pt informed that he can receive medication at this time, but per hospital policy will need to wait 60 minutes before he can be discharged to home. Prescription for his pain medication given to him to fill once he is discharged. Verbalized Understanding. No further questions or concerns expressed. Discharge instructions signed by pt.

## 2015-07-30 NOTE — Telephone Encounter (Signed)
Pharmacy is calling needing a PA for Clonidine, XR. Did you mean to Rx this or can it be changed to immediate release?

## 2015-07-30 NOTE — Discharge Instructions (Addendum)
Heart Attack A heart attack (myocardial infarction, MI) causes damage to your heart that cannot be fixed. A heart attack can happen when a heart (coronary) artery becomes blocked or narrowed. This cuts off the blood supply and oxygen to your heart. When one or more of your coronary arteries becomes blocked, that area of your heart begins to die. This causes the pain you feel during a heart attack. Heart attack pain can also occur in one part of the body but be felt in another part of the body (referred pain). You may feel referred heart attack pain in your left arm, neck, or jaw. Pain may even be felt in the right arm. CAUSES  Many conditions can cause a heart attack. These include:   Atherosclerosis. This is when a fatty substance (plaque) gradually builds up in the arteries. This buildup can block or reduce the blood supply to one or more of the heart arteries.  A blood clot. A blood clot can develop suddenly when plaque breaks up (ruptures) within a heart artery. A blood clot can block the heart artery, which prevents blood flow to the heart.   Severe tightening (spasm) of the heart artery. This cuts off blood flow through the artery.  RISK FACTORS People at risk for heart attack usually have one or more of the following risk factors:   High blood pressure (hypertension).  High cholesterol.  Smoking.  Being male.  Being overweight or obese.  Older aged.   A family history of heart disease.  Lack of exercise.  Diabetes.  Stress.  Drinking too much alcohol.  Using illegal street drugs, such as cocaine and methamphetamines. SYMPTOMS  Heart attack symptoms can vary from person to person. Symptoms depend on factors like gender and age.   In both men and women, heart attack symptoms can include the following:   Chest pain. This may feel like crushing, squeezing, or a feeling of pressure.  Shortness of breath.  Heartburn or indigestion with or without vomiting,  shortness of breath, or sweating.  Sudden cold sweats.  Sudden light-headedness.  Upper back pain.   Women can have unique heart attack symptoms, such as:   Unexplained feelings of nervousness or anxiety.  Discomfort between the shoulder blades or upper back.  Tingling in the hands and arms.   Older people (of both genders) can have subtle heart attack symptoms, such as:   Sweating.  Shortness of breath.  General tiredness or not feeling well.  DIAGNOSIS  Diagnosing a heart attack involves several tests. They include:   An assessment of your vital signs. This includes checking your:  Heart rhythm.  Blood pressure.  Breathing rate.  Oxygen level.   An ECG (electrocardiogram) to measure the electrical activity of your heart.  Blood tests called cardiac markers. In these tests, blood is drawn at scheduled times to check for the specific proteins or enzymes released by damaged heart muscle.  A chest X-ray.  An echocardiogram to evaluate heart motion and blood flow.  Coronary angiography to look at the heart arteries.  TREATMENT  Treatment for a heart attack may include:   Medicine that breaks up or dissolves blood clots in the heart artery.  Angioplasty.  Cardiac stent placement.  Intra-aortic balloon pump therapy (IABP).  Open heart surgery (coronary artery bypass graft, CABG). HOME CARE INSTRUCTIONS  Take medicines only as directed by your health care provider. You may need to take medicine after a heart attack to:   Keep your blood from  clotting too easily.  Control your blood pressure.  Lower your cholesterol.  Control abnormal heart rhythms.   Do not take the following medicines unless your health care provider approves:  Nonsteroidal anti-inflammatory drugs (NSAIDs), such as ibuprofen, naproxen, or celecoxib.  Vitamin supplements that contain vitamin A, vitamin E, or both.  Hormone replacement therapy that contains estrogen with or  without progestin.  Make lifestyle changes as directed by your health care provider. These may include:   Quitting smoking, if you smoke.  Getting regular exercise. Ask your health care provider to suggest some activities that are safe for you.  Eating a heart-healthy diet. A registered dietitian can help you learn healthy eating options.  Maintaining a healthy weight.   Managing diabetes, if necessary.  Reducing stress.  Limiting how much alcohol you drink. SEEK IMMEDIATE MEDICAL CARE IF:   You have sudden, unexplained chest discomfort.  You have sudden, unexplained discomfort in your arms, back, neck, or jaw.  You have shortness of breath at any time.  You suddenly start to sweat or your skin gets clammy.  You feel nauseous or vomit.  You suddenly feel light-headed or dizzy.  Your heart begins to beat fast or feels like it is skipping beats. These symptoms may represent a serious problem that is an emergency. Do not wait to see if the symptoms will go away. Get medical help right away. Call your local emergency services (911 in the [place] Do not drive yourself to the hospital.   This information is not intended to replace advice given to you by your health care provider. Make sure you discuss any questions you have with your health care provider.   Document Released: 04/24/2005 Document Revised: 05/15/2014 Document Reviewed: 06/27/2013 Elsevier Interactive Patient Education 2016 [place].  Heart Attack A heart attack (myocardial infarction, MI) causes damage to the heart that cannot be fixed. A heart attack often happens when a blood clot or other blockage cuts blood flow to the heart. When this happens, certain areas of the heart begin to die. This causes the pain you feel during a heart attack. HOME CARE  Take medicine as told by your doctor. You may need medicine to:  Keep your blood from clotting too easily.  Control your blood pressure.  Lower your  cholesterol.  Control abnormal heart rhythms.  Change certain behaviors as told by your doctor. This may include:  Quitting smoking.  Being active.  Eating a heart-healthy diet. Ask your doctor for help with this diet.  Keeping a healthy weight.  Keeping your diabetes under control.  Lessening stress.  Limiting how much alcohol you drink. Do not take these medicines unless your doctor says that you can:  Nonsteroidal anti-inflammatory drugs (NSAIDs). These include:  Ibuprofen.  Naproxen.  Celecoxib.  Vitamin supplements that have vitamin A, vitamin E, or both.  Hormone therapy that contains estrogen with or without progestin. GET HELP RIGHT AWAY IF:  You have sudden chest discomfort.  You have sudden discomfort in your:  Arms.  Back.  Neck.  Jaw.  You have shortness of breath at any time.  You have sudden sweating or clammy skin.  You feel sick to your stomach (nauseous) or throw up (vomit).  You suddenly get light-headed or dizzy.  You feel your heart beating fast or skipping beats. These symptoms may be an emergency. Do not wait to see if the symptoms will go away. Get medical help right away. Call your local emergency services (911 in the  [place] Do not drive yourself to the hospital.   This information is not intended to replace advice given to you by your health care provider. Make sure you discuss any questions you have with your health care provider.   Document Released: 10/24/2011 Document Revised: 09/08/2014 Document Reviewed: 06/27/2013 Elsevier Interactive Patient Education 2016 [place].  Heart Failure Heart failure means your heart has trouble pumping blood. This makes it hard for your body to work well. Heart failure is usually a long-term (chronic) condition. You must take good care of yourself and follow your doctor's treatment plan. HOME CARE  Take your heart medicine as told by your doctor.  Do not stop taking medicine unless  your doctor tells you to.  Do not skip any dose of medicine.  Refill your medicines before they run out.  Take other medicines only as told by your doctor or pharmacist.  Stay active if told by your doctor. The elderly and people with severe heart failure should talk with a doctor about physical activity.  Eat heart-healthy foods. Choose foods that are without trans fat and are low in saturated fat, cholesterol, and salt (sodium). This includes fresh or frozen fruits and vegetables, fish, lean meats, fat-free or low-fat dairy foods, whole grains, and high-fiber foods. Lentils and dried peas and beans (legumes) are also good choices.  Limit salt if told by your doctor.  Cook in a healthy way. Roast, grill, broil, bake, poach, steam, or stir-fry foods.  Limit fluids as told by your doctor.  Weigh yourself every morning. Do this after you pee (urinate) and before you eat breakfast. Write down your weight to give to your doctor.  Take your blood pressure and write it down if your doctor tells you to.  Ask your doctor how to check your pulse. Check your pulse as told.  Lose weight if told by your doctor.  Stop smoking or chewing tobacco. Do not use gum or patches that help you quit without your doctor's approval.  Schedule and go to doctor visits as told.  Nonpregnant women should have no more than 1 drink a day. Men should have no more than 2 drinks a day. Talk to your doctor about drinking alcohol.  Stop illegal drug use.  Stay current with shots (immunizations).  Manage your health conditions as told by your doctor.  Learn to manage your stress.  Rest when you are tired.  If it is really hot outside:  Avoid intense activities.  Use air conditioning or fans, or get in a cooler place.  Avoid caffeine and alcohol.  Wear loose-fitting, lightweight, and light-colored clothing.  If it is really cold outside:  Avoid intense activities.  Layer your clothing.  Wear  mittens or gloves, a hat, and a scarf when going outside.  Avoid alcohol.  Learn about heart failure and get support as needed.  Get help to maintain or improve your quality of life and your ability to care for yourself as needed. GET HELP IF:   You gain weight quickly.  You are more short of breath than usual.  You cannot do your normal activities.  You tire easily.  You cough more than normal, especially with activity.  You have any or more puffiness (swelling) in areas such as your hands, feet, ankles, or belly (abdomen).  You cannot sleep because it is hard to breathe.  You feel like your heart is beating fast (palpitations).  You get dizzy or light-headed when you stand up. GET HELP  RIGHT AWAY IF:   You have trouble breathing.  There is a change in mental status, such as becoming less alert or not being able to focus.  You have chest pain or discomfort.  You faint. MAKE SURE YOU:   Understand these instructions.  Will watch your condition.  Will get help right away if you are not doing well or get worse.   This information is not intended to replace advice given to you by your health care provider. Make sure you discuss any questions you have with your health care provider.   Document Released: 02/01/2008 Document Revised: 05/15/2014 Document Reviewed: 06/10/2012 Elsevier Interactive Patient Education 2016 Elsevier Inc[employer]  Cough, Adult A cough helps to clear your throat and lungs. A cough may last only 2-3 weeks (acute), or it may last longer than 8 weeks (chronic). Many different things can cause a cough. A cough may be a sign of an illness or another medical condition. HOME CARE  Pay attention to any changes in your cough.  Take medicines only as told by your doctor.  If you were prescribed an antibiotic medicine, take it as told by your doctor. Do not stop taking it even if you start to feel better.  Talk with your doctor before you try using a cough  medicine.  Drink enough fluid to keep your pee (urine) clear or pale yellow.  If the air is dry, use a cold steam vaporizer or humidifier in your home.  Stay away from things that make you cough at work or at home.  If your cough is worse at night, try using extra pillows to raise your head up higher while you sleep.  Do not smoke, and try not to be around smoke. If you need help quitting, ask your doctor.  Do not have caffeine.  Do not drink alcohol.  Rest as needed. GET HELP IF:  You have new problems (symptoms).  You cough up yellow fluid (pus).  Your cough does not get better after 2-3 weeks, or your cough gets worse.  Medicine does not help your cough and you are not sleeping well.  You have pain that gets worse or pain that is not helped with medicine.  You have a fever.  You are losing weight and you do not know why.  You have night sweats. GET HELP RIGHT AWAY IF:  You cough up blood.  You have trouble breathing.  Your heartbeat is very fast.   This information is not intended to replace advice given to you by your health care provider. Make sure you discuss any questions you have with your health care provider.   Document Released: 01/05/2011 Document Revised: 01/13/2015 Document Reviewed: 07/01/2014 Elsevier Interactive Patient Education [employer].

## 2015-07-30 NOTE — Care Management Note (Signed)
Case Management Note  Patient Details  Name: Glendel Dondiego MRN: [id number] Date of Birth: 1970-05-31  Subjective/Objective:  Pt admitted for Hypertensive Emergency. Pt has Medicaid out of State listed for insurance. Pt states he uses [facility] and does not have any issues getting his medications.                   Action/Plan: No further needs identified by CM at this time.    Expected Discharge Date:  07/29/15               Expected Discharge Plan:  Home/Self Care  In-House Referral:  NA  Discharge planning Services  CM Consult, Medication Assistance  Post Acute Care Choice:  NA Choice offered to:  NA  DME Arranged:  N/A DME Agency:  NA  HH Arranged:  NA HH Agency:  NA  Status of Service:  Completed, signed off  Medicare Important Message Given:    Date Medicare IM Given:    Medicare IM give by:    Date Additional Medicare IM Given:    Additional Medicare Important Message give by:     If discussed at [place] of Stay Meetings, dates discussed:    Additional Comments:  Bethena Roys, RN 07/30/2015, 10:17 AM

## 2015-08-02 NOTE — Telephone Encounter (Signed)
Due to the lateness of the day on Friday, his Clonidine was switched to generic. Pharmacist informed.

## 2015-08-12 ENCOUNTER — Encounter: Payer: Medicaid - Out of State | Admitting: Cardiology

## 2015-08-12 NOTE — Progress Notes (Signed)
This encounter was created in error - please disregard.

## 2015-09-01 ENCOUNTER — Ambulatory Visit: Payer: PRIVATE HEALTH INSURANCE | Admitting: Cardiology

## 2015-09-08 ENCOUNTER — Encounter: Payer: Self-pay | Admitting: Cardiology

## 2015-11-10 ENCOUNTER — Encounter (HOSPITAL_COMMUNITY): Payer: Self-pay | Admitting: *Deleted

## 2015-11-10 ENCOUNTER — Emergency Department (HOSPITAL_COMMUNITY)
Admission: EM | Admit: 2015-11-10 | Discharge: 2015-11-10 | Disposition: A | Discharge status: Home / Self Care | Payer: Medicaid Other | Attending: Emergency Medicine | Admitting: Emergency Medicine

## 2015-11-10 ENCOUNTER — Emergency Department (HOSPITAL_COMMUNITY): Payer: Medicaid Other

## 2015-11-10 DIAGNOSIS — Z7982 Long term (current) use of aspirin: Secondary | ICD-10-CM | POA: Diagnosis not present

## 2015-11-10 DIAGNOSIS — F17210 Nicotine dependence, cigarettes, uncomplicated: Secondary | ICD-10-CM | POA: Insufficient documentation

## 2015-11-10 DIAGNOSIS — Z79899 Other long term (current) drug therapy: Secondary | ICD-10-CM | POA: Diagnosis not present

## 2015-11-10 DIAGNOSIS — I2510 Atherosclerotic heart disease of native coronary artery without angina pectoris: Secondary | ICD-10-CM | POA: Insufficient documentation

## 2015-11-10 DIAGNOSIS — Z955 Presence of coronary angioplasty implant and graft: Secondary | ICD-10-CM | POA: Diagnosis not present

## 2015-11-10 DIAGNOSIS — I10 Essential (primary) hypertension: Secondary | ICD-10-CM | POA: Diagnosis not present

## 2015-11-10 DIAGNOSIS — M25512 Pain in left shoulder: Secondary | ICD-10-CM

## 2015-11-10 DIAGNOSIS — Z951 Presence of aortocoronary bypass graft: Secondary | ICD-10-CM | POA: Insufficient documentation

## 2015-11-10 MED ORDER — IBUPROFEN 600 MG PO TABS: 600.0000 mg | ORAL_TABLET | Freq: Three times a day (TID) | ORAL | Status: DC | PRN

## 2015-11-10 MED ORDER — IBUPROFEN 400 MG PO TABS
600.0000 mg | ORAL_TABLET | Freq: Once | ORAL | Status: AC
  Administered 2015-11-10: 600 mg via ORAL
  Filled 2015-11-10: qty 1

## 2015-11-10 MED ORDER — OXYCODONE-ACETAMINOPHEN 5-325 MG PO TABS
2.0000 | ORAL_TABLET | Freq: Once | ORAL | Status: AC
  Administered 2015-11-10: 2 via ORAL
  Filled 2015-11-10: qty 2

## 2015-11-10 NOTE — ED Notes (Addendum)
Pt in c/o lump to his left shoulder that he first noted today, denies drainage, area tender to touch, swelling noted but no redness noted- pt states he had his arm propped up on the back of his couch and leaned forward when he first felt the pain
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
## 2015-11-10 NOTE — ED Notes (Signed)
Pt reports that he did not take his lasix or BP medication today

## 2015-11-10 NOTE — Discharge Instructions (Signed)

## 2015-12-29 ENCOUNTER — Encounter (HOSPITAL_COMMUNITY): Payer: Self-pay | Admitting: Emergency Medicine

## 2015-12-29 ENCOUNTER — Ambulatory Visit (HOSPITAL_BASED_OUTPATIENT_CLINIC_OR_DEPARTMENT_OTHER): Payer: Medicaid Other

## 2015-12-29 ENCOUNTER — Inpatient Hospital Stay (HOSPITAL_COMMUNITY)
Admission: EM | Admit: 2015-12-29 | Discharge: 2016-01-04 | DRG: 286 | Disposition: A | Discharge status: Home / Self Care | Payer: Medicaid Other | Attending: Internal Medicine | Admitting: Internal Medicine

## 2015-12-29 ENCOUNTER — Emergency Department (HOSPITAL_COMMUNITY): Payer: Medicaid Other

## 2015-12-29 DIAGNOSIS — I25710 Atherosclerosis of autologous vein coronary artery bypass graft(s) with unstable angina pectoris: Secondary | ICD-10-CM | POA: Diagnosis present

## 2015-12-29 DIAGNOSIS — Z9114 Patient's other noncompliance with medication regimen: Secondary | ICD-10-CM

## 2015-12-29 DIAGNOSIS — M7989 Other specified soft tissue disorders: Secondary | ICD-10-CM | POA: Diagnosis not present

## 2015-12-29 DIAGNOSIS — I5033 Acute on chronic diastolic (congestive) heart failure: Secondary | ICD-10-CM | POA: Diagnosis not present

## 2015-12-29 DIAGNOSIS — Z79891 Long term (current) use of opiate analgesic: Secondary | ICD-10-CM

## 2015-12-29 DIAGNOSIS — I5043 Acute on chronic combined systolic (congestive) and diastolic (congestive) heart failure: Secondary | ICD-10-CM | POA: Diagnosis present

## 2015-12-29 DIAGNOSIS — T501X6A Underdosing of loop [high-ceiling] diuretics, initial encounter: Secondary | ICD-10-CM | POA: Diagnosis present

## 2015-12-29 DIAGNOSIS — T39016A Underdosing of aspirin, initial encounter: Secondary | ICD-10-CM | POA: Diagnosis present

## 2015-12-29 DIAGNOSIS — Z87828 Personal history of other (healed) physical injury and trauma: Secondary | ICD-10-CM

## 2015-12-29 DIAGNOSIS — E876 Hypokalemia: Secondary | ICD-10-CM | POA: Diagnosis not present

## 2015-12-29 DIAGNOSIS — F17210 Nicotine dependence, cigarettes, uncomplicated: Secondary | ICD-10-CM | POA: Diagnosis present

## 2015-12-29 DIAGNOSIS — I160 Hypertensive urgency: Secondary | ICD-10-CM | POA: Diagnosis present

## 2015-12-29 DIAGNOSIS — I2583 Coronary atherosclerosis due to lipid rich plaque: Secondary | ICD-10-CM

## 2015-12-29 DIAGNOSIS — F1290 Cannabis use, unspecified, uncomplicated: Secondary | ICD-10-CM | POA: Diagnosis present

## 2015-12-29 DIAGNOSIS — Z8249 Family history of ischemic heart disease and other diseases of the circulatory system: Secondary | ICD-10-CM

## 2015-12-29 DIAGNOSIS — R454 Irritability and anger: Secondary | ICD-10-CM | POA: Diagnosis present

## 2015-12-29 DIAGNOSIS — G894 Chronic pain syndrome: Secondary | ICD-10-CM | POA: Diagnosis present

## 2015-12-29 DIAGNOSIS — I130 Hypertensive heart and chronic kidney disease with heart failure and stage 1 through stage 4 chronic kidney disease, or unspecified chronic kidney disease: Principal | ICD-10-CM | POA: Diagnosis present

## 2015-12-29 DIAGNOSIS — N179 Acute kidney failure, unspecified: Secondary | ICD-10-CM | POA: Diagnosis present

## 2015-12-29 DIAGNOSIS — I2510 Atherosclerotic heart disease of native coronary artery without angina pectoris: Secondary | ICD-10-CM | POA: Diagnosis present

## 2015-12-29 DIAGNOSIS — I10 Essential (primary) hypertension: Secondary | ICD-10-CM | POA: Diagnosis present

## 2015-12-29 DIAGNOSIS — Z888 Allergy status to other drugs, medicaments and biological substances status: Secondary | ICD-10-CM

## 2015-12-29 DIAGNOSIS — I509 Heart failure, unspecified: Secondary | ICD-10-CM

## 2015-12-29 DIAGNOSIS — E785 Hyperlipidemia, unspecified: Secondary | ICD-10-CM | POA: Diagnosis present

## 2015-12-29 DIAGNOSIS — E669 Obesity, unspecified: Secondary | ICD-10-CM | POA: Diagnosis present

## 2015-12-29 DIAGNOSIS — I2582 Chronic total occlusion of coronary artery: Secondary | ICD-10-CM | POA: Diagnosis present

## 2015-12-29 DIAGNOSIS — Z9119 Patient's noncompliance with other medical treatment and regimen: Secondary | ICD-10-CM

## 2015-12-29 DIAGNOSIS — I25110 Atherosclerotic heart disease of native coronary artery with unstable angina pectoris: Secondary | ICD-10-CM | POA: Diagnosis present

## 2015-12-29 DIAGNOSIS — Z6838 Body mass index (BMI) 38.0-38.9, adult: Secondary | ICD-10-CM

## 2015-12-29 DIAGNOSIS — Z955 Presence of coronary angioplasty implant and graft: Secondary | ICD-10-CM

## 2015-12-29 DIAGNOSIS — N183 Chronic kidney disease, stage 3 unspecified: Secondary | ICD-10-CM | POA: Diagnosis present

## 2015-12-29 DIAGNOSIS — Z951 Presence of aortocoronary bypass graft: Secondary | ICD-10-CM

## 2015-12-29 DIAGNOSIS — F1190 Opioid use, unspecified, uncomplicated: Secondary | ICD-10-CM | POA: Diagnosis present

## 2015-12-29 DIAGNOSIS — T465X6A Underdosing of other antihypertensive drugs, initial encounter: Secondary | ICD-10-CM | POA: Diagnosis present

## 2015-12-29 DIAGNOSIS — Z79899 Other long term (current) drug therapy: Secondary | ICD-10-CM

## 2015-12-29 DIAGNOSIS — M79604 Pain in right leg: Secondary | ICD-10-CM | POA: Diagnosis present

## 2015-12-29 DIAGNOSIS — I252 Old myocardial infarction: Secondary | ICD-10-CM

## 2015-12-29 DIAGNOSIS — Z91199 Patient's noncompliance with other medical treatment and regimen due to unspecified reason: Secondary | ICD-10-CM

## 2015-12-29 DIAGNOSIS — Z91128 Patient's intentional underdosing of medication regimen for other reason: Secondary | ICD-10-CM

## 2015-12-29 DIAGNOSIS — F1910 Other psychoactive substance abuse, uncomplicated: Secondary | ICD-10-CM

## 2015-12-29 DIAGNOSIS — Z7982 Long term (current) use of aspirin: Secondary | ICD-10-CM

## 2015-12-29 HISTORY — DX: Angina pectoris, unspecified: I20.9

## 2015-12-29 HISTORY — DX: Unspecified convulsions: R56.9

## 2015-12-29 HISTORY — DX: Acute myocardial infarction, unspecified: I21.9

## 2015-12-29 HISTORY — DX: Anxiety disorder, unspecified: F41.9

## 2015-12-29 HISTORY — DX: Anemia, unspecified: D64.9

## 2015-12-29 HISTORY — DX: Heart failure, unspecified: I50.9

## 2015-12-29 HISTORY — DX: Sleep apnea, unspecified: G47.30

## 2015-12-29 HISTORY — DX: Personal history of other medical treatment: Z92.89

## 2015-12-29 LAB — BASIC METABOLIC PANEL
ANION GAP: 6 (ref 5–15)
BUN: 16 mg/dL (ref 6–20)
CHLORIDE: 107 mmol/L (ref 101–111)
CO2: 23 mmol/L (ref 22–32)
Calcium: 8.5 mg/dL — ABNORMAL LOW (ref 8.9–10.3)
Creatinine, Ser: 1.83 mg/dL — ABNORMAL HIGH (ref 0.61–1.24)
GFR calc Af Amer: 50 mL/min — ABNORMAL LOW (ref >60)
GFR, EST NON AFRICAN AMERICAN: 43 mL/min — AB (ref >60)
Glucose, Bld: 132 mg/dL — ABNORMAL HIGH (ref 65–99)
POTASSIUM: 2.9 mmol/L — AB (ref 3.5–5.1)
SODIUM: 136 mmol/L (ref 135–145)

## 2015-12-29 LAB — CBC
HEMATOCRIT: 43.2 % (ref 39.0–52.0)
HEMOGLOBIN: 14.6 g/dL (ref 13.0–17.0)
MCH: 31.6 pg (ref 26.0–34.0)
MCHC: 33.8 g/dL (ref 30.0–36.0)
MCV: 93.5 fL (ref 78.0–100.0)
Platelets: 225 10*3/uL (ref 150–400)
RBC: 4.62 MIL/uL (ref 4.22–5.81)
RDW: 15.4 % (ref 11.5–15.5)
WBC: 7.9 10*3/uL (ref 4.0–10.5)

## 2015-12-29 LAB — I-STAT TROPONIN, ED
TROPONIN I, POC: 0.03 ng/mL (ref 0.00–0.08)
Troponin i, poc: 0.04 ng/mL (ref 0.00–0.08)

## 2015-12-29 LAB — BRAIN NATRIURETIC PEPTIDE: B NATRIURETIC PEPTIDE 5: 756.2 pg/mL — AB (ref 0.0–100.0)

## 2015-12-29 LAB — RAPID URINE DRUG SCREEN, HOSP PERFORMED
AMPHETAMINES: NOT DETECTED
BARBITURATES: NOT DETECTED
BENZODIAZEPINES: NOT DETECTED
COCAINE: NOT DETECTED
Opiates: NOT DETECTED
TETRAHYDROCANNABINOL: NOT DETECTED

## 2015-12-29 LAB — MAGNESIUM: Magnesium: 2 mg/dL (ref 1.7–2.4)

## 2015-12-29 MED ORDER — AMLODIPINE BESYLATE 10 MG PO TABS
10.0000 mg | ORAL_TABLET | Freq: Every day | ORAL | Status: DC
  Administered 2015-12-29 – 2016-01-04 (×6): 10 mg via ORAL
  Filled 2015-12-29 (×2): qty 2
  Filled 2015-12-29: qty 1
  Filled 2015-12-29 (×3): qty 2

## 2015-12-29 MED ORDER — HYDRALAZINE HCL 50 MG PO TABS
50.0000 mg | ORAL_TABLET | Freq: Three times a day (TID) | ORAL | Status: DC
  Administered 2015-12-29 – 2016-01-03 (×17): 50 mg via ORAL
  Filled 2015-12-29 (×16): qty 1

## 2015-12-29 MED ORDER — HEPARIN SODIUM (PORCINE) 5000 UNIT/ML IJ SOLN
5000.0000 [IU] | Freq: Three times a day (TID) | INTRAMUSCULAR | Status: DC
  Administered 2015-12-29 – 2016-01-03 (×16): 5000 [IU] via SUBCUTANEOUS
  Filled 2015-12-29 (×16): qty 1

## 2015-12-29 MED ORDER — FUROSEMIDE 10 MG/ML IJ SOLN
40.0000 mg | Freq: Two times a day (BID) | INTRAMUSCULAR | Status: DC
  Administered 2015-12-30 – 2015-12-31 (×3): 40 mg via INTRAVENOUS
  Filled 2015-12-29 (×4): qty 4

## 2015-12-29 MED ORDER — SODIUM CHLORIDE 0.9 % IV SOLN
250.0000 mL | INTRAVENOUS | Status: DC | PRN
  Administered 2015-12-29: 250 mL via INTRAVENOUS

## 2015-12-29 MED ORDER — SODIUM CHLORIDE 0.9% FLUSH: 3.0000 mL | INTRAVENOUS | Status: DC | PRN

## 2015-12-29 MED ORDER — ASPIRIN EC 81 MG PO TBEC
81.0000 mg | DELAYED_RELEASE_TABLET | Freq: Every day | ORAL | Status: DC
  Administered 2015-12-29 – 2016-01-02 (×5): 81 mg via ORAL
  Filled 2015-12-29 (×5): qty 1

## 2015-12-29 MED ORDER — CARVEDILOL 25 MG PO TABS
25.0000 mg | ORAL_TABLET | Freq: Two times a day (BID) | ORAL | Status: DC
  Administered 2015-12-29 – 2016-01-04 (×13): 25 mg via ORAL
  Filled 2015-12-29 (×10): qty 2
  Filled 2015-12-29: qty 1
  Filled 2015-12-29: qty 2
  Filled 2015-12-29: qty 1
  Filled 2015-12-29 (×2): qty 2

## 2015-12-29 MED ORDER — FUROSEMIDE 10 MG/ML IJ SOLN
80.0000 mg | Freq: Once | INTRAMUSCULAR | Status: AC
  Administered 2015-12-29: 80 mg via INTRAVENOUS

## 2015-12-29 MED ORDER — CLONIDINE HCL ER 0.1 MG PO TB12
0.1000 mg | ORAL_TABLET | Freq: Two times a day (BID) | ORAL | Status: DC
Start: 2015-12-29 — End: 2016-01-04
  Administered 2015-12-29 – 2016-01-03 (×11): 0.1 mg via ORAL
  Filled 2015-12-29 (×13): qty 1

## 2015-12-29 MED ORDER — ACETAMINOPHEN 325 MG PO TABS
325.0000 mg | ORAL_TABLET | ORAL | Status: DC | PRN
  Administered 2016-01-02 – 2016-01-03 (×2): 325 mg via ORAL
  Filled 2015-12-29 (×2): qty 1

## 2015-12-29 MED ORDER — ATORVASTATIN CALCIUM 40 MG PO TABS
40.0000 mg | ORAL_TABLET | Freq: Every day | ORAL | Status: DC
  Administered 2015-12-29 – 2016-01-03 (×6): 40 mg via ORAL
  Filled 2015-12-29 (×7): qty 1

## 2015-12-29 MED ORDER — SODIUM CHLORIDE 0.9% FLUSH
3.0000 mL | Freq: Two times a day (BID) | INTRAVENOUS | Status: DC
  Administered 2015-12-29 (×2): 3 mL via INTRAVENOUS

## 2015-12-29 MED ORDER — POTASSIUM CHLORIDE CRYS ER 20 MEQ PO TBCR
40.0000 meq | EXTENDED_RELEASE_TABLET | Freq: Two times a day (BID) | ORAL | Status: DC
  Administered 2015-12-29 – 2016-01-04 (×12): 40 meq via ORAL
  Filled 2015-12-29 (×12): qty 2

## 2015-12-29 MED ORDER — LORAZEPAM 2 MG/ML IJ SOLN
0.5000 mg | Freq: Four times a day (QID) | INTRAMUSCULAR | Status: DC | PRN
Start: 2015-12-29 — End: 2015-12-30
  Administered 2015-12-30: 0.5 mg via INTRAVENOUS
  Filled 2015-12-29 (×2): qty 1

## 2015-12-29 MED ORDER — HYDROCODONE-ACETAMINOPHEN 10-325 MG PO TABS
1.0000 | ORAL_TABLET | Freq: Four times a day (QID) | ORAL | Status: DC | PRN
  Administered 2015-12-29 – 2016-01-04 (×17): 1 via ORAL
  Filled 2015-12-29 (×18): qty 1

## 2015-12-29 MED ORDER — POTASSIUM CHLORIDE 10 MEQ/100ML IV SOLN
10.0000 meq | INTRAVENOUS | Status: AC
  Administered 2015-12-29 (×6): 10 meq via INTRAVENOUS
  Filled 2015-12-29 (×5): qty 100

## 2015-12-29 MED ORDER — FUROSEMIDE 10 MG/ML IJ SOLN
80.0000 mg | Freq: Once | INTRAMUSCULAR | Status: AC
  Administered 2015-12-29: 80 mg via INTRAVENOUS
  Filled 2015-12-29: qty 8

## 2015-12-29 NOTE — Progress Notes (Signed)
Pt arrived to the unit oriented to the unit. C/O muscle cramps. Lt Flank. Pt given heading pads and educated on possible causes.

## 2015-12-29 NOTE — Plan of Care (Signed)
45 year old male with history of CHF and CAD presents with chest pain and shortness of breath. As per the ER physician patient is a mattress and looks like in decompensated CHF. Patient was given Lasix 80 mg IV and admitted for further management.  Gean Birchwood.

## 2015-12-29 NOTE — ED Triage Notes (Signed)
Patient arrives with complaint of chest pain and shortness of breath x2 weeks with increased swelling in extremities and abdomen. History of CABG 1 year ago. Takes lasix but misses doses sometimes.

## 2015-12-29 NOTE — ED Notes (Signed)
MD Pollina at bedside. 
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
## 2015-12-29 NOTE — Progress Notes (Signed)
**  Preliminary report by tech**  Bilateral lower extremity venous duplex completed. There is no evidence of deep or superficial vein thrombosis involving the right and left lower extremities. All visualized vessels appear patent and compressible. There is no evidence of Baker's cysts bilaterally.  12/29/15 12:28 PM Scott Salinas RVT

## 2015-12-30 ENCOUNTER — Encounter (HOSPITAL_COMMUNITY): Payer: Self-pay | Admitting: General Practice

## 2015-12-30 ENCOUNTER — Observation Stay (HOSPITAL_COMMUNITY): Payer: Medicaid Other

## 2015-12-30 ENCOUNTER — Other Ambulatory Visit (HOSPITAL_COMMUNITY): Payer: Medicaid - Out of State

## 2015-12-30 DIAGNOSIS — N183 Chronic kidney disease, stage 3 (moderate): Secondary | ICD-10-CM | POA: Diagnosis not present

## 2015-12-30 DIAGNOSIS — E876 Hypokalemia: Secondary | ICD-10-CM

## 2015-12-30 DIAGNOSIS — I10 Essential (primary) hypertension: Secondary | ICD-10-CM | POA: Diagnosis not present

## 2015-12-30 DIAGNOSIS — E669 Obesity, unspecified: Secondary | ICD-10-CM | POA: Diagnosis present

## 2015-12-30 DIAGNOSIS — R454 Irritability and anger: Secondary | ICD-10-CM | POA: Diagnosis present

## 2015-12-30 DIAGNOSIS — I25710 Atherosclerosis of autologous vein coronary artery bypass graft(s) with unstable angina pectoris: Secondary | ICD-10-CM | POA: Diagnosis present

## 2015-12-30 DIAGNOSIS — I5023 Acute on chronic systolic (congestive) heart failure: Secondary | ICD-10-CM | POA: Diagnosis not present

## 2015-12-30 DIAGNOSIS — I5021 Acute systolic (congestive) heart failure: Secondary | ICD-10-CM | POA: Diagnosis not present

## 2015-12-30 DIAGNOSIS — Z91128 Patient's intentional underdosing of medication regimen for other reason: Secondary | ICD-10-CM | POA: Diagnosis not present

## 2015-12-30 DIAGNOSIS — I130 Hypertensive heart and chronic kidney disease with heart failure and stage 1 through stage 4 chronic kidney disease, or unspecified chronic kidney disease: Secondary | ICD-10-CM | POA: Diagnosis present

## 2015-12-30 DIAGNOSIS — G894 Chronic pain syndrome: Secondary | ICD-10-CM | POA: Diagnosis present

## 2015-12-30 DIAGNOSIS — I2582 Chronic total occlusion of coronary artery: Secondary | ICD-10-CM | POA: Diagnosis present

## 2015-12-30 DIAGNOSIS — F17210 Nicotine dependence, cigarettes, uncomplicated: Secondary | ICD-10-CM | POA: Diagnosis present

## 2015-12-30 DIAGNOSIS — F1190 Opioid use, unspecified, uncomplicated: Secondary | ICD-10-CM

## 2015-12-30 DIAGNOSIS — I25119 Atherosclerotic heart disease of native coronary artery with unspecified angina pectoris: Secondary | ICD-10-CM | POA: Diagnosis not present

## 2015-12-30 DIAGNOSIS — R0602 Shortness of breath: Secondary | ICD-10-CM | POA: Diagnosis present

## 2015-12-30 DIAGNOSIS — Z955 Presence of coronary angioplasty implant and graft: Secondary | ICD-10-CM | POA: Diagnosis not present

## 2015-12-30 DIAGNOSIS — R0789 Other chest pain: Secondary | ICD-10-CM | POA: Diagnosis not present

## 2015-12-30 DIAGNOSIS — Z6838 Body mass index (BMI) 38.0-38.9, adult: Secondary | ICD-10-CM | POA: Diagnosis not present

## 2015-12-30 DIAGNOSIS — T465X6A Underdosing of other antihypertensive drugs, initial encounter: Secondary | ICD-10-CM | POA: Diagnosis present

## 2015-12-30 DIAGNOSIS — I252 Old myocardial infarction: Secondary | ICD-10-CM | POA: Diagnosis not present

## 2015-12-30 DIAGNOSIS — T501X6A Underdosing of loop [high-ceiling] diuretics, initial encounter: Secondary | ICD-10-CM | POA: Diagnosis present

## 2015-12-30 DIAGNOSIS — I5043 Acute on chronic combined systolic (congestive) and diastolic (congestive) heart failure: Secondary | ICD-10-CM | POA: Diagnosis not present

## 2015-12-30 DIAGNOSIS — M79604 Pain in right leg: Secondary | ICD-10-CM | POA: Diagnosis present

## 2015-12-30 DIAGNOSIS — I25110 Atherosclerotic heart disease of native coronary artery with unstable angina pectoris: Secondary | ICD-10-CM | POA: Diagnosis not present

## 2015-12-30 DIAGNOSIS — I509 Heart failure, unspecified: Secondary | ICD-10-CM | POA: Diagnosis not present

## 2015-12-30 DIAGNOSIS — N179 Acute kidney failure, unspecified: Secondary | ICD-10-CM | POA: Diagnosis present

## 2015-12-30 DIAGNOSIS — I5033 Acute on chronic diastolic (congestive) heart failure: Secondary | ICD-10-CM | POA: Diagnosis not present

## 2015-12-30 DIAGNOSIS — Z9119 Patient's noncompliance with other medical treatment and regimen: Secondary | ICD-10-CM | POA: Diagnosis not present

## 2015-12-30 DIAGNOSIS — Z9114 Patient's other noncompliance with medication regimen: Secondary | ICD-10-CM | POA: Diagnosis not present

## 2015-12-30 DIAGNOSIS — F1910 Other psychoactive substance abuse, uncomplicated: Secondary | ICD-10-CM | POA: Diagnosis not present

## 2015-12-30 DIAGNOSIS — R079 Chest pain, unspecified: Secondary | ICD-10-CM | POA: Diagnosis not present

## 2015-12-30 DIAGNOSIS — F1290 Cannabis use, unspecified, uncomplicated: Secondary | ICD-10-CM | POA: Diagnosis present

## 2015-12-30 DIAGNOSIS — E785 Hyperlipidemia, unspecified: Secondary | ICD-10-CM | POA: Diagnosis present

## 2015-12-30 DIAGNOSIS — I160 Hypertensive urgency: Secondary | ICD-10-CM | POA: Diagnosis present

## 2015-12-30 DIAGNOSIS — T39016A Underdosing of aspirin, initial encounter: Secondary | ICD-10-CM | POA: Diagnosis present

## 2015-12-30 LAB — BASIC METABOLIC PANEL
ANION GAP: 7 (ref 5–15)
BUN: 17 mg/dL (ref 6–20)
CO2: 27 mmol/L (ref 22–32)
Calcium: 8.9 mg/dL (ref 8.9–10.3)
Chloride: 104 mmol/L (ref 101–111)
Creatinine, Ser: 1.85 mg/dL — ABNORMAL HIGH (ref 0.61–1.24)
GFR calc Af Amer: 49 mL/min — ABNORMAL LOW (ref >60)
GFR, EST NON AFRICAN AMERICAN: 42 mL/min — AB (ref >60)
GLUCOSE: 107 mg/dL — AB (ref 65–99)
POTASSIUM: 3.7 mmol/L (ref 3.5–5.1)
Sodium: 138 mmol/L (ref 135–145)

## 2015-12-30 LAB — CBC
HEMATOCRIT: 47.3 % (ref 39.0–52.0)
HEMOGLOBIN: 15.4 g/dL (ref 13.0–17.0)
MCH: 30.7 pg (ref 26.0–34.0)
MCHC: 32.6 g/dL (ref 30.0–36.0)
MCV: 94.4 fL (ref 78.0–100.0)
Platelets: 251 10*3/uL (ref 150–400)
RBC: 5.01 MIL/uL (ref 4.22–5.81)
RDW: 15.7 % — ABNORMAL HIGH (ref 11.5–15.5)
WBC: 6.6 10*3/uL (ref 4.0–10.5)

## 2015-12-30 LAB — MRSA PCR SCREENING: MRSA by PCR: NEGATIVE

## 2015-12-30 MED ORDER — ISOSORBIDE MONONITRATE ER 30 MG PO TB24
30.0000 mg | ORAL_TABLET | Freq: Every day | ORAL | Status: DC
  Administered 2015-12-30 – 2016-01-04 (×5): 30 mg via ORAL
  Filled 2015-12-30 (×5): qty 1

## 2015-12-30 MED ORDER — GABAPENTIN 300 MG PO CAPS
300.0000 mg | ORAL_CAPSULE | Freq: Three times a day (TID) | ORAL | Status: DC
  Administered 2015-12-30 – 2016-01-04 (×14): 300 mg via ORAL
  Filled 2015-12-30 (×16): qty 1

## 2015-12-30 MED ORDER — ALPRAZOLAM 0.5 MG PO TABS
0.5000 mg | ORAL_TABLET | Freq: Three times a day (TID) | ORAL | Status: DC | PRN
  Administered 2015-12-30 – 2016-01-03 (×8): 0.5 mg via ORAL
  Filled 2015-12-30 (×8): qty 1
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
## 2015-12-30 NOTE — Progress Notes (Signed)
Received CM consult for CHF; patient angry stating that he is still sob and in pain- he states that he is leaving. Patient bedside nurse is aware; unable to complete consult. Mindi Slicker [facility] [phone]

## 2015-12-31 ENCOUNTER — Inpatient Hospital Stay (HOSPITAL_COMMUNITY): Payer: Medicaid Other

## 2015-12-31 ENCOUNTER — Encounter (HOSPITAL_COMMUNITY): Payer: Self-pay | Admitting: Physician Assistant

## 2015-12-31 DIAGNOSIS — I5033 Acute on chronic diastolic (congestive) heart failure: Secondary | ICD-10-CM

## 2015-12-31 DIAGNOSIS — I509 Heart failure, unspecified: Secondary | ICD-10-CM

## 2015-12-31 DIAGNOSIS — I25119 Atherosclerotic heart disease of native coronary artery with unspecified angina pectoris: Secondary | ICD-10-CM

## 2015-12-31 LAB — ECHOCARDIOGRAM COMPLETE
HEIGHTINCHES: 69 in
WEIGHTICAEL: 4184 [oz_av]

## 2015-12-31 LAB — BASIC METABOLIC PANEL
Anion gap: 10 (ref 5–15)
BUN: 21 mg/dL — AB (ref 6–20)
CHLORIDE: 102 mmol/L (ref 101–111)
CO2: 26 mmol/L (ref 22–32)
CREATININE: 1.86 mg/dL — AB (ref 0.61–1.24)
Calcium: 8.9 mg/dL (ref 8.9–10.3)
GFR calc Af Amer: 49 mL/min — ABNORMAL LOW (ref >60)
GFR, EST NON AFRICAN AMERICAN: 42 mL/min — AB (ref >60)
Glucose, Bld: 79 mg/dL (ref 65–99)
Potassium: 3.8 mmol/L (ref 3.5–5.1)
SODIUM: 138 mmol/L (ref 135–145)

## 2015-12-31 MED ORDER — FUROSEMIDE 10 MG/ML IJ SOLN
60.0000 mg | Freq: Two times a day (BID) | INTRAMUSCULAR | Status: DC
  Administered 2015-12-31: 60 mg via INTRAVENOUS
  Filled 2015-12-31: qty 6

## 2015-12-31 NOTE — Progress Notes (Signed)
Pharmacist Heart Failure Core Measure Documentation  Assessment: Scott Salinas has an EF documented as 35-40% on 8/25.   Rationale: Heart failure patients with left ventricular systolic dysfunction (LVSD) and an EF < 40% should be prescribed an angiotensin converting enzyme inhibitor (ACEI) or angiotensin receptor blocker (ARB) at discharge unless a contraindication is documented in the medical record.  This patient is not currently on an ACEI or ARB for HF.  This note is being placed in the record in order to provide documentation that a contraindication to the use of these agents is present for this encounter.  ACE Inhibitor or Angiotensin Receptor Blocker is contraindicated (specify all that apply)  []   ACEI allergy AND ARB allergy [x]   Angioedema - Whole right side of face swelled []   Moderate or severe aortic stenosis []   Hyperkalemia []   Hypotension []   Renal artery stenosis []   Worsening renal function, preexisting renal disease or dysfunction  [name] 12/31/2015 1:23 PM

## 2015-12-31 NOTE — Progress Notes (Signed)
1815 resting comfortaby in chair
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
## 2015-12-31 NOTE — Care Management Note (Signed)
Case Management Note  Patient Details  Name: Scott Salinas MRN: [id number] Date of Birth: 1970/10/02  Subjective/Objective:          Admitted with Chest Pain          Action/Plan: Patient is here from [place] visiting his daughter and is planning on staying in [place]; Patient has Medicaid of [place] and [place]; CM informed to patient that he can follow up at the  [facility] at discharge until he returns to [place] (he states that is where all of his doctors are). Patient is very emotional (ex girlfriend is taking his daughter to [place]) he is fearful of what the heart cath might reveal. Lots of emotional support given.   Expected Discharge Date:    possibly 01/04/2016              Expected Discharge Plan:  Home/Self Care  Discharge planning Services  CM Consult  Status of Service:  In process, will continue to follow  Sherrilyn Rist [id number] 12/31/2015, 3:19 PM

## 2015-12-31 NOTE — Progress Notes (Signed)
Patient c/o being short of breath after ambulating to the door of his room and and back to bed O2 stats 100% on room air  Pt had left side headache 1 vicodan given. Arthor Captain LPN
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
## 2015-12-31 NOTE — Progress Notes (Signed)
  Echocardiogram 2D Echocardiogram has been performed.  Jennette Dubin 12/31/2015, 8:53 AM

## 2016-01-01 DIAGNOSIS — N179 Acute kidney failure, unspecified: Secondary | ICD-10-CM

## 2016-01-01 DIAGNOSIS — N183 Chronic kidney disease, stage 3 (moderate): Secondary | ICD-10-CM

## 2016-01-01 DIAGNOSIS — F1910 Other psychoactive substance abuse, uncomplicated: Secondary | ICD-10-CM

## 2016-01-01 DIAGNOSIS — I5043 Acute on chronic combined systolic (congestive) and diastolic (congestive) heart failure: Secondary | ICD-10-CM

## 2016-01-01 DIAGNOSIS — Z9119 Patient's noncompliance with other medical treatment and regimen: Secondary | ICD-10-CM

## 2016-01-01 DIAGNOSIS — I10 Essential (primary) hypertension: Secondary | ICD-10-CM

## 2016-01-01 LAB — BASIC METABOLIC PANEL
ANION GAP: 9 (ref 5–15)
BUN: 21 mg/dL — ABNORMAL HIGH (ref 6–20)
CHLORIDE: 102 mmol/L (ref 101–111)
CO2: 28 mmol/L (ref 22–32)
Calcium: 8.8 mg/dL — ABNORMAL LOW (ref 8.9–10.3)
Creatinine, Ser: 1.97 mg/dL — ABNORMAL HIGH (ref 0.61–1.24)
GFR calc non Af Amer: 39 mL/min — ABNORMAL LOW (ref >60)
GFR, EST AFRICAN AMERICAN: 46 mL/min — AB (ref >60)
Glucose, Bld: 119 mg/dL — ABNORMAL HIGH (ref 65–99)
Potassium: 3.8 mmol/L (ref 3.5–5.1)
SODIUM: 139 mmol/L (ref 135–145)

## 2016-01-01 MED ORDER — FUROSEMIDE 10 MG/ML IJ SOLN
40.0000 mg | Freq: Once | INTRAMUSCULAR | Status: AC
  Administered 2016-01-01: 40 mg via INTRAVENOUS
  Filled 2016-01-01: qty 4
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
## 2016-01-01 NOTE — Progress Notes (Signed)
Pt had 5 bt run VT. Pt in no distress. Card fellow texted

## 2016-01-01 NOTE — Progress Notes (Signed)
Patient Name: Scott Salinas Date of Encounter: 01/01/2016  Active Problems:   Chest pain   Accelerated hypertension   History of noncompliance with medical treatment   Coronary artery disease with unspecified angina pectoris   CKD (chronic kidney disease) stage 3, GFR 30-59 ml/min   S/P CABG x 4   Hypokalemia   Chronic narcotic use   CHF (congestive heart failure) (HCC)   Acute on chronic diastolic CHF (congestive heart failure) (HCC)   Right leg swelling   Drug abuse   CHF exacerbation ([place])   Length of Stay: 2  SUBJECTIVE  Mildly improved SOB, but persistent 2 pillow orthopnea. Persistent but improved LE edema R> L.  CURRENT MEDS . amLODipine  10 mg Oral Daily  . aspirin EC  81 mg Oral Daily  . atorvastatin  40 mg Oral q1800  . carvedilol  25 mg Oral BID WC  . cloNIDine HCl  0.1 mg Oral BID  . gabapentin  300 mg Oral TID  . heparin  5,000 Units Subcutaneous Q8H  . hydrALAZINE  50 mg Oral Q8H  . isosorbide mononitrate  30 mg Oral Daily  . potassium chloride  40 mEq Oral BID    OBJECTIVE  Vitals:   12/31/15 2031 12/31/15 2200 01/01/16 0611 01/01/16 0946  BP:  119/71 (!) 129/92 (!) 144/109  Pulse:  67 73 79  Resp:  18 17 20   Temp:  97.4 F (36.3 C) 97.8 F (36.6 C) 98 F (36.7 C)  TempSrc:  Oral Oral Oral  SpO2: 100% 99% 97% 100%  Weight:   262 lb (118.8 kg)   Height:        Intake/Output Summary (Last 24 hours) at 01/01/16 1057 Last data filed at 01/01/16 0900  Gross per 24 hour  Intake              960 ml  Output             3150 ml  Net            -2190 ml   Filed Weights   12/30/15 0622 12/31/15 0625 01/01/16 0611  Weight: 262 lb 12.8 oz (119.2 kg) 261 lb 8 oz (118.6 kg) 262 lb (118.8 kg)    PHYSICAL EXAM  General: Pleasant, NAD. Neuro: Alert and oriented X 3. Moves all extremities spontaneously. Psych: Normal affect. HEENT:  Normal  Neck: Supple without bruits or JVD. Lungs:  Resp regular and unlabored, CTA. Heart: RRR no s3, s4,  or murmurs. Abdomen: Soft, non-tender, non-distended, BS + x 4.  Extremities: No clubbing, cyanosis, +2 LE edema R>L. DP/PT/Radials 2+ and equal bilaterally.  Accessory Clinical Findings  CBC  Recent Labs  12/30/15 0546  WBC 6.6  HGB 15.4  HCT 47.3  MCV 94.4  PLT 123XX123   Basic Metabolic Panel  Recent Labs  12/31/15 0346 01/01/16 0457  NA 138 139  K 3.8 3.8  CL 102 102  CO2 26 28  GLUCOSE 79 119*  BUN 21* 21*  CREATININE 1.86* 1.97*  CALCIUM 8.9 8.8*   Radiology/Studies  Dg Chest 2 View  Result Date: 12/30/2015 CLINICAL DATA:  CHF, coronary artery disease, prior CABG. EXAM: CHEST  2 VIEW COMPARISON:  PA and lateral chest x-ray of December 29, 2015 FINDINGS: The lungs are well-expanded and clear. There is no interstitial or alveolar edema or pleural effusion. The cardiac silhouette is mildly enlarged though stable. The central pulmonary vascularity is prominent but also stable. The sternal wires are  intact. The bony thorax exhibits no acute abnormality. IMPRESSION: Stable mild cardiomegaly. No significant interstitial edema. Stable post CABG changes. Electronically Signed   By: David  Martinique M.D.   On: 12/30/2015 07:11   TELE: SR  ECG:   TTE: 06/2015 Study Conclusions  - Left ventricle: The cavity size was moderately dilated. Wall   thickness was increased in a pattern of mild LVH. Systolic   function was normal. The estimated ejection fraction was in the   range of 55% to 60%. Features are consistent with a pseudonormal   left ventricular filling pattern, with concomitant abnormal   relaxation and increased filling pressure (grade 2 diastolic   dysfunction). Doppler parameters are consistent with high   ventricular filling pressure. - Mitral valve: There was mild regurgitation. - Left atrium: The atrium was mildly dilated.   TTE: 12/31/15 Study Conclusions  - Left ventricle: The cavity size was mildly dilated. There was   mild concentric hypertrophy. Systolic  function was moderately   reduced. The estimated ejection fraction was in the range of 35%   to 40%. Severe hypokinesis of the basal-midanterolateral,   inferolateral, and inferior myocardium; consistent with ischemia   in the distribution of the right coronary and left circumflex   coronary artery. Doppler parameters are consistent with   restrictive physiology, indicative of decreased left ventricular   diastolic compliance and/or increased left atrial pressure. - Ventricular septum: Septal motion showed paradox. These changes   are consistent with a post-thoracotomy state. - Aortic valve: There was trivial regurgitation. - Mitral valve: There was mild to moderate regurgitation. - Left atrium: The atrium was moderately dilated. - Right ventricle: The cavity size was mildly dilated. Systolic   function was moderately reduced. - Right atrium: The atrium was moderately dilated. - Tricuspid valve: There was moderate-severe regurgitation directed   centrally. - Pulmonary arteries: Systolic pressure was mildly increased. PA   peak pressure: 40 mm Hg (S).  Impressions:  - Compared to February 2017, LV function has deteriorated and there   are new wall motion abnormalities.   ASSESSMENT AND PLAN  45 y.o. year old male with a history of HTN, CKD-3, CAD, MI in 2006 treated with DES to OM1 and BMS to LCx, CABG 01/2015 w/ LIMA-LAD, RIMA-OM1 (Y graft off LIMA), SVG-D2-D3, THC use, medication noncompliance. HTN urgency in 07/2015.   Admitted on 12/29/2015 with hypertensive urgency, chest pain and CHF. Diagnosed with new LV dysfunction 35-40% with regional WMA in the RCA/LCX territory, previously 55-60% and no regional WMA.   1. CAD, s/p CABG as described above, with new CP and SOB and new LV dysfunction and regional WMA, he is scheduled for a cath on Monday. The problem might be acute on chronic systolic CHF. I will give additional lasix 40 mg iv x 1 today and follow crea tomorrow, then bicarb  drip prior to cath.   2. Acute on chronic combined systolic and diastolic CHF, give additional lasix 40 mg iv x 1 and follow in the am, elevate legs.   3. New LV dysfunction - he is n carvedilol, amlodipine, hydralazine, imdur, BP elevated today, I will increase imdur to 60 mg po daily, no ACEI/ARB with acute on chronic kidney failure  4. Hypertensive urgency - I will increase imdur to 60 mg po daily,  5. Acute on chronic kidney failure - I will give additional lasix 40 mg iv x 1 today and follow crea tomorrow, then bicarb drip prior to cath.    Signed,  Ena Dawley MD, [facility] 01/01/2016

## 2016-01-02 DIAGNOSIS — R079 Chest pain, unspecified: Secondary | ICD-10-CM

## 2016-01-02 LAB — BASIC METABOLIC PANEL
ANION GAP: 6 (ref 5–15)
BUN: 20 mg/dL (ref 6–20)
CHLORIDE: 106 mmol/L (ref 101–111)
CO2: 26 mmol/L (ref 22–32)
CREATININE: 1.85 mg/dL — AB (ref 0.61–1.24)
Calcium: 8.7 mg/dL — ABNORMAL LOW (ref 8.9–10.3)
GFR calc non Af Amer: 42 mL/min — ABNORMAL LOW (ref >60)
GFR, EST AFRICAN AMERICAN: 49 mL/min — AB (ref >60)
GLUCOSE: 107 mg/dL — AB (ref 65–99)
POTASSIUM: 3.9 mmol/L (ref 3.5–5.1)
SODIUM: 138 mmol/L (ref 135–145)

## 2016-01-02 MED ORDER — POLYETHYLENE GLYCOL 3350 17 G PO PACK
17.0000 g | PACK | Freq: Every day | ORAL | Status: DC
Start: 2016-01-02 — End: 2016-01-04
  Administered 2016-01-02: 17 g via ORAL
  Filled 2016-01-02 (×2): qty 1

## 2016-01-02 MED ORDER — SORBITOL 70 % SOLN
30.0000 mL | Freq: Every day | Status: DC | PRN
Start: 2016-01-02 — End: 2016-01-04
  Administered 2016-01-02: 30 mL via ORAL
  Filled 2016-01-02: qty 30

## 2016-01-02 MED ORDER — SENNOSIDES-DOCUSATE SODIUM 8.6-50 MG PO TABS
2.0000 | ORAL_TABLET | Freq: Every evening | ORAL | Status: DC | PRN
Start: 2016-01-02 — End: 2016-01-04
  Administered 2016-01-02: 2 via ORAL
  Filled 2016-01-02: qty 2

## 2016-01-02 NOTE — Progress Notes (Signed)
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 HTN urgency in 07/2015.   Admitted on 12/29/2015 with hypertensive urgency, chest pain and CHF. Diagnosed with new LV dysfunction 35-40% with regional WMA in the RCA/LCX territory, previously 55-60% and no regional WMA.   Subjective   No chest pain.  Edema improved.  No SOB  Inpatient Medications    . amLODipine  10 mg Oral Daily  . aspirin EC  81 mg Oral Daily  . atorvastatin  40 mg Oral q1800  . carvedilol  25 mg Oral BID WC  . cloNIDine HCl  0.1 mg Oral BID  . gabapentin  300 mg Oral TID  . heparin  5,000 Units Subcutaneous Q8H  . hydrALAZINE  50 mg Oral Q8H  . isosorbide mononitrate  30 mg Oral Daily  . polyethylene glycol  17 g Oral Daily  . potassium chloride  40 mEq Oral BID    Vital Signs    Vitals:   01/02/16 0529 01/02/16 0537 01/02/16 0900 01/02/16 0904  BP: (!) 138/108  (!) 151/112 (!) 146/105  Pulse: 71     Resp: 16     Temp: 97.7 F (36.5 C)     TempSrc: Oral     SpO2: 100%     Weight:  265 lb 6.4 oz (120.4 kg)    Height:        Intake/Output Summary (Last 24 hours) at 01/02/16 1142 Last data filed at 01/02/16 0811  Gross per 24 hour  Intake             1196 ml  Output             4225 ml    Net            -3029 ml   Filed Weights   12/31/15 0625 01/01/16 0611 01/02/16 0537  Weight: 261 lb 8 oz (118.6 kg) 262 lb (118.8 kg) 265 lb 6.4 oz (120.4 kg)    Physical Exam    GEN: Well nourished, well developed, in no acute distress.  HEENT: normal.  Neck: Supple, no JVD, carotid bruits, or masses. Cardiac: RRR, no murmurs, rubs, or gallops. No clubbing, cyanosis, edema.  Radials/DP/PT 2+ and equal bilaterally.  Respiratory:  Respirations regular and unlabored, clear to auscultation bilaterally. GI: Soft, nontender, nondistended, BS + x 4. MS: no deformity or atrophy. Skin: warm and dry, no rash. Neuro:  Strength and sensation are intact. Psych: Normal affect.  Labs    CBC No results for input(s): WBC, NEUTROABS, HGB, HCT, MCV, PLT in the last 72 hours. Basic Metabolic Panel  Recent Labs  01/01/16 0457 01/02/16 0217  NA 139 138  K 3.8 3.9  CL 102 106  CO2 28 26  GLUCOSE 119* 107*  BUN 21* 20  CREATININE 1.97* 1.85*  CALCIUM 8.8* 8.7*   Liver Function Tests No results for input(s): AST, ALT, ALKPHOS, BILITOT, PROT, ALBUMIN in the last 72 hours. No results for input(s): LIPASE, AMYLASE in the last 72 hours. Cardiac Enzymes No results for input(s): CKTOTAL, CKMB, CKMBINDEX, TROPONINI in the last 72 hours. BNP Invalid input(s): POCBNP D-Dimer No results for input(s): DDIMER in the last 72 hours. Hemoglobin A1C No results for input(s): HGBA1C in the last 72 hours. Fasting Lipid Panel No results for input(s): CHOL, HDL, LDLCALC, TRIG, CHOLHDL, LDLDIRECT in the last 72 hours. Thyroid Function Tests No results for input(s): TSH, T4TOTAL, T3FREE, THYROIDAB in the last 72 hours.  Invalid input(s): FREET3  Telemetry    NSR  ECG    NA  Radiology    Dg Chest 2 View  Result Date: 12/30/2015 CLINICAL DATA:  CHF, coronary artery disease, prior CABG. EXAM: CHEST  2 VIEW COMPARISON:  PA and lateral chest x-ray of December 29, 2015 FINDINGS: The lungs are  well-expanded and clear. There is no interstitial or alveolar edema or pleural effusion. The cardiac silhouette is mildly enlarged though stable. The central pulmonary vascularity is prominent but also stable. The sternal wires are intact. The bony thorax exhibits no acute abnormality. IMPRESSION: Stable mild cardiomegaly. No significant interstitial edema. Stable post CABG changes. Electronically Signed   By: David  Martinique M.D.   On: 12/30/2015 07:11   Dg Chest 2 View  Result Date: 12/29/2015 CLINICAL DATA:  Chest pain, shortness of breath, leg swelling for 3 days. EXAM: CHEST  2 VIEW COMPARISON:  Radiographs 07/29/2015 FINDINGS: Patient is post median sternotomy and CABG. Mild cardiomegaly. Unchanged mediastinal contours. Chronic blunting of the right costophrenic angle. No large pleural effusion. No pulmonary edema or focal airspace opacity. Unchanged osseous structures. IMPRESSION: Mild cardiomegaly. Post CABG. No congestive failure or acute abnormality. Electronically Signed   By: Jeb Levering M.D.   On: 12/29/2015 01:14    Assessment & Plan     CAD:  Cath on Monday.   Need to limit dye use.  Hydration is limited because he is volume overloaded.  I will not give further Lasix and he will have gentle hydration in the AM.   Acute on chronic combined systolic and diastolic CHF:  Given additional Lasix yesterday.   Given additional lasix 40 mg iv x 1 yesterdat  New LV dysfunction:   -   Continue current therapy.  Imdur increased yesterday.  No ACEI/ARB with acute on chronic kidney failure  Hypertensive urgency:  Imdur increased yesterday.  BP is elevated but improved.   Acute on chronic kidney failure -  As above.  Creat improved today.    Signed, Minus Breeding, MD  01/02/2016, 11:42 AM
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
## 2016-01-02 NOTE — Progress Notes (Signed)
Patient refused bed alarm then again agreed to have it put on. When phlebotomist was in his room, did not like bed alarm being on and asked to have it turned off. Report will be given to on coming RN momentarily.

## 2016-01-03 ENCOUNTER — Inpatient Hospital Stay (HOSPITAL_COMMUNITY)
Admission: EM | Disposition: A | Discharge status: Home / Self Care | Payer: Self-pay | Source: Home / Self Care | Attending: Internal Medicine

## 2016-01-03 ENCOUNTER — Encounter (HOSPITAL_COMMUNITY): Payer: Self-pay | Admitting: Internal Medicine

## 2016-01-03 DIAGNOSIS — I25110 Atherosclerotic heart disease of native coronary artery with unstable angina pectoris: Secondary | ICD-10-CM

## 2016-01-03 HISTORY — PX: CARDIAC CATHETERIZATION: SHX172

## 2016-01-03 LAB — BASIC METABOLIC PANEL
ANION GAP: 8 (ref 5–15)
BUN: 14 mg/dL (ref 6–20)
CALCIUM: 8.7 mg/dL — AB (ref 8.9–10.3)
CO2: 24 mmol/L (ref 22–32)
Chloride: 106 mmol/L (ref 101–111)
Creatinine, Ser: 1.62 mg/dL — ABNORMAL HIGH (ref 0.61–1.24)
GFR calc Af Amer: 58 mL/min — ABNORMAL LOW (ref >60)
GFR calc non Af Amer: 50 mL/min — ABNORMAL LOW (ref >60)
GLUCOSE: 121 mg/dL — AB (ref 65–99)
Potassium: 3.9 mmol/L (ref 3.5–5.1)
Sodium: 138 mmol/L (ref 135–145)

## 2016-01-03 LAB — PROTIME-INR
INR: 0.93
Prothrombin Time: 12.5 seconds (ref 11.4–15.2)

## 2016-01-03 LAB — CBC
HEMATOCRIT: 44.1 % (ref 39.0–52.0)
HEMOGLOBIN: 14.6 g/dL (ref 13.0–17.0)
MCH: 31.2 pg (ref 26.0–34.0)
MCHC: 33.1 g/dL (ref 30.0–36.0)
MCV: 94.2 fL (ref 78.0–100.0)
Platelets: 242 10*3/uL (ref 150–400)
RBC: 4.68 MIL/uL (ref 4.22–5.81)
RDW: 15.3 % (ref 11.5–15.5)
WBC: 4.6 10*3/uL (ref 4.0–10.5)

## 2016-01-03 LAB — CREATININE, SERUM
CREATININE: 1.63 mg/dL — AB (ref 0.61–1.24)
GFR, EST AFRICAN AMERICAN: 57 mL/min — AB (ref >60)
GFR, EST NON AFRICAN AMERICAN: 49 mL/min — AB (ref >60)

## 2016-01-03 SURGERY — LEFT HEART CATH AND CORS/GRAFTS ANGIOGRAPHY

## 2016-01-03 MED ORDER — SODIUM CHLORIDE 0.9% FLUSH
3.0000 mL | Freq: Two times a day (BID) | INTRAVENOUS | Status: DC
  Administered 2016-01-03: 3 mL via INTRAVENOUS

## 2016-01-03 MED ORDER — IOPAMIDOL (ISOVUE-370) INJECTION 76%
INTRAVENOUS | Status: AC
  Filled 2016-01-03: qty 100

## 2016-01-03 MED ORDER — HEPARIN (PORCINE) IN NACL 2-0.9 UNIT/ML-% IJ SOLN
INTRAMUSCULAR | Status: DC | PRN
  Administered 2016-01-03: 1000 mL via INTRA_ARTERIAL

## 2016-01-03 MED ORDER — SODIUM CHLORIDE 0.9% FLUSH: 3.0000 mL | INTRAVENOUS | Status: DC | PRN

## 2016-01-03 MED ORDER — ASPIRIN EC 81 MG PO TBEC
81.0000 mg | DELAYED_RELEASE_TABLET | Freq: Every day | ORAL | Status: DC
  Administered 2016-01-04: 81 mg via ORAL
  Filled 2016-01-03: qty 1

## 2016-01-03 MED ORDER — FENTANYL CITRATE (PF) 100 MCG/2ML IJ SOLN
INTRAMUSCULAR | Status: AC
  Filled 2016-01-03: qty 2

## 2016-01-03 MED ORDER — LIDOCAINE HCL (PF) 1 % IJ SOLN
INTRAMUSCULAR | Status: AC
  Filled 2016-01-03: qty 30

## 2016-01-03 MED ORDER — SODIUM CHLORIDE 0.9 % IV SOLN: 250.0000 mL | INTRAVENOUS | Status: DC | PRN

## 2016-01-03 MED ORDER — HYDRALAZINE HCL 50 MG PO TABS
75.0000 mg | ORAL_TABLET | Freq: Three times a day (TID) | ORAL | Status: DC
  Administered 2016-01-03 – 2016-01-04 (×2): 75 mg via ORAL
  Filled 2016-01-03 (×3): qty 1

## 2016-01-03 MED ORDER — VERAPAMIL HCL 2.5 MG/ML IV SOLN
INTRAVENOUS | Status: AC
  Filled 2016-01-03: qty 2

## 2016-01-03 MED ORDER — ONDANSETRON HCL 4 MG/2ML IJ SOLN
4.0000 mg | Freq: Four times a day (QID) | INTRAMUSCULAR | Status: DC | PRN
Start: 2016-01-03 — End: 2016-01-04

## 2016-01-03 MED ORDER — MIDAZOLAM HCL 2 MG/2ML IJ SOLN
INTRAMUSCULAR | Status: DC | PRN
  Administered 2016-01-03: 2 mg via INTRAVENOUS

## 2016-01-03 MED ORDER — SODIUM CHLORIDE 0.9% FLUSH
3.0000 mL | Freq: Two times a day (BID) | INTRAVENOUS | Status: DC
  Administered 2016-01-03 (×2): 3 mL via INTRAVENOUS

## 2016-01-03 MED ORDER — IOPAMIDOL (ISOVUE-370) INJECTION 76%
INTRAVENOUS | Status: DC | PRN
  Administered 2016-01-03: 60 mL via INTRA_ARTERIAL

## 2016-01-03 MED ORDER — ACETAMINOPHEN 325 MG PO TABS: 650.0000 mg | ORAL_TABLET | ORAL | Status: DC | PRN

## 2016-01-03 MED ORDER — FENTANYL CITRATE (PF) 100 MCG/2ML IJ SOLN
INTRAMUSCULAR | Status: DC | PRN
  Administered 2016-01-03: 50 ug via INTRAVENOUS

## 2016-01-03 MED ORDER — SODIUM CHLORIDE 0.9 % IV SOLN
INTRAVENOUS | Status: DC
  Administered 2016-01-03: 04:00:00 via INTRAVENOUS

## 2016-01-03 MED ORDER — HYDRALAZINE HCL 20 MG/ML IJ SOLN
10.0000 mg | Freq: Once | INTRAMUSCULAR | Status: AC
  Administered 2016-01-03: 10 mg via INTRAVENOUS

## 2016-01-03 MED ORDER — LIDOCAINE HCL (PF) 1 % IJ SOLN
INTRAMUSCULAR | Status: DC | PRN
  Administered 2016-01-03: 20 mL via INTRADERMAL

## 2016-01-03 MED ORDER — MIDAZOLAM HCL 2 MG/2ML IJ SOLN
INTRAMUSCULAR | Status: AC
  Filled 2016-01-03: qty 2

## 2016-01-03 MED ORDER — HEPARIN SODIUM (PORCINE) 5000 UNIT/ML IJ SOLN
5000.0000 [IU] | Freq: Three times a day (TID) | INTRAMUSCULAR | Status: DC
  Administered 2016-01-03 – 2016-01-04 (×2): 5000 [IU] via SUBCUTANEOUS
  Filled 2016-01-03 (×2): qty 1

## 2016-01-03 MED ORDER — HEPARIN (PORCINE) IN NACL 2-0.9 UNIT/ML-% IJ SOLN
INTRAMUSCULAR | Status: AC
  Filled 2016-01-03: qty 1500

## 2016-01-03 MED ORDER — SODIUM CHLORIDE 0.9 % IV SOLN: INTRAVENOUS | Status: AC

## 2016-01-03 MED ORDER — SODIUM CHLORIDE 0.9 % IV SOLN
250.0000 mL | INTRAVENOUS | Status: DC | PRN
Start: 2016-01-03 — End: 2016-01-03

## 2016-01-03 MED ORDER — ASPIRIN 81 MG PO CHEW
324.0000 mg | CHEWABLE_TABLET | ORAL | Status: AC
  Administered 2016-01-03: 324 mg via ORAL
  Filled 2016-01-03: qty 4

## 2016-01-03 MED ORDER — HYDRALAZINE HCL 20 MG/ML IJ SOLN
INTRAMUSCULAR | Status: AC
  Filled 2016-01-03: qty 1

## 2016-01-03 SURGICAL SUPPLY — 9 items
CATH INFINITI 5FR JL5 (CATHETERS) ×3 IMPLANT
CATH INFINITI 5FR MULTPACK ANG (CATHETERS) ×3 IMPLANT
GLIDESHEATH SLEND A-KIT 6F 20G (SHEATH) IMPLANT
KIT HEART LEFT (KITS) ×3 IMPLANT
PACK CARDIAC CATHETERIZATION (CUSTOM PROCEDURE TRAY) ×3 IMPLANT
SHEATH PINNACLE 5F 10CM (SHEATH) ×3 IMPLANT
TRANSDUCER W/STOPCOCK (MISCELLANEOUS) ×3 IMPLANT
TUBING CIL FLEX 10 FLL-RA (TUBING) ×3 IMPLANT
WIRE EMERALD 3MM-J .035X150CM (WIRE) ×3 IMPLANT

## 2016-01-03 NOTE — H&P (View-Only) (Signed)
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 HTN urgency in 07/2015.   Admitted on 12/29/2015 with hypertensive urgency, chest pain and CHF. Diagnosed with new LV dysfunction 35-40% with regional WMA in the RCA/LCX territory, previously 55-60% and no regional WMA.   Subjective   No chest pain.  Edema improved.  No SOB  Inpatient Medications    . amLODipine  10 mg Oral Daily  . aspirin EC  81 mg Oral Daily  . atorvastatin  40 mg Oral q1800  . carvedilol  25 mg Oral BID WC  . cloNIDine HCl  0.1 mg Oral BID  . gabapentin  300 mg Oral TID  . heparin  5,000 Units Subcutaneous Q8H  . hydrALAZINE  50 mg Oral Q8H  . isosorbide mononitrate  30 mg Oral Daily  . polyethylene glycol  17 g Oral Daily  . potassium chloride  40 mEq Oral BID    Vital Signs    Vitals:   01/02/16 0529 01/02/16 0537 01/02/16 0900 01/02/16 0904  BP: (!) 138/108  (!) 151/112 (!) 146/105  Pulse: 71     Resp: 16     Temp: 97.7 F (36.5 C)     TempSrc: Oral     SpO2: 100%     Weight:  265 lb 6.4 oz (120.4 kg)    Height:        Intake/Output Summary (Last 24 hours) at 01/02/16 1142 Last data filed at 01/02/16 0811  Gross per 24 hour  Intake             1196 ml  Output             4225 ml    Net            -3029 ml   Filed Weights   12/31/15 0625 01/01/16 0611 01/02/16 0537  Weight: 261 lb 8 oz (118.6 kg) 262 lb (118.8 kg) 265 lb 6.4 oz (120.4 kg)    Physical Exam    GEN: Well nourished, well developed, in no acute distress.  HEENT: normal.  Neck: Supple, no JVD, carotid bruits, or masses. Cardiac: RRR, no murmurs, rubs, or gallops. No clubbing, cyanosis, edema.  Radials/DP/PT 2+ and equal bilaterally.  Respiratory:  Respirations regular and unlabored, clear to auscultation bilaterally. GI: Soft, nontender, nondistended, BS + x 4. MS: no deformity or atrophy. Skin: warm and dry, no rash. Neuro:  Strength and sensation are intact. Psych: Normal affect.  Labs    CBC No results for input(s): WBC, NEUTROABS, HGB, HCT, MCV, PLT in the last 72 hours. Basic Metabolic Panel  Recent Labs  01/01/16 0457 01/02/16 0217  NA 139 138  K 3.8 3.9  CL 102 106  CO2 28 26  GLUCOSE 119* 107*  BUN 21* 20  CREATININE 1.97* 1.85*  CALCIUM 8.8* 8.7*   Liver Function Tests No results for input(s): AST, ALT, ALKPHOS, BILITOT, PROT, ALBUMIN in the last 72 hours. No results for input(s): LIPASE, AMYLASE in the last 72 hours. Cardiac Enzymes No results for input(s): CKTOTAL, CKMB, CKMBINDEX, TROPONINI in the last 72 hours. BNP Invalid input(s): POCBNP D-Dimer No results for input(s): DDIMER in the last 72 hours. Hemoglobin A1C No results for input(s): HGBA1C in the last 72 hours. Fasting Lipid Panel No results for input(s): CHOL, HDL, LDLCALC, TRIG, CHOLHDL, LDLDIRECT in the last 72 hours. Thyroid Function Tests No results for input(s): TSH, T4TOTAL, T3FREE, THYROIDAB in the last 72 hours.  Invalid input(s): FREET3  Telemetry    NSR  ECG    NA  Radiology    Dg Chest 2 View  Result Date: 12/30/2015 CLINICAL DATA:  CHF, coronary artery disease, prior CABG. EXAM: CHEST  2 VIEW COMPARISON:  PA and lateral chest x-ray of December 29, 2015 FINDINGS: The lungs are  well-expanded and clear. There is no interstitial or alveolar edema or pleural effusion. The cardiac silhouette is mildly enlarged though stable. The central pulmonary vascularity is prominent but also stable. The sternal wires are intact. The bony thorax exhibits no acute abnormality. IMPRESSION: Stable mild cardiomegaly. No significant interstitial edema. Stable post CABG changes. Electronically Signed   By: David  Martinique M.D.   On: 12/30/2015 07:11   Dg Chest 2 View  Result Date: 12/29/2015 CLINICAL DATA:  Chest pain, shortness of breath, leg swelling for 3 days. EXAM: CHEST  2 VIEW COMPARISON:  Radiographs 07/29/2015 FINDINGS: Patient is post median sternotomy and CABG. Mild cardiomegaly. Unchanged mediastinal contours. Chronic blunting of the right costophrenic angle. No large pleural effusion. No pulmonary edema or focal airspace opacity. Unchanged osseous structures. IMPRESSION: Mild cardiomegaly. Post CABG. No congestive failure or acute abnormality. Electronically Signed   By: Jeb Levering M.D.   On: 12/29/2015 01:14    Assessment & Plan     CAD:  Cath on Monday.   Need to limit dye use.  Hydration is limited because he is volume overloaded.  I will not give further Lasix and he will have gentle hydration in the AM.   Acute on chronic combined systolic and diastolic CHF:  Given additional Lasix yesterday.   Given additional lasix 40 mg iv x 1 yesterdat  New LV dysfunction:   -   Continue current therapy.  Imdur increased yesterday.  No ACEI/ARB with acute on chronic kidney failure  Hypertensive urgency:  Imdur increased yesterday.  BP is elevated but improved.   Acute on chronic kidney failure -  As above.  Creat improved today.    Signed, Minus Breeding, MD  01/02/2016, 11:42 AM

## 2016-01-03 NOTE — CV Procedure (Addendum)
Cardiac cath results:  Hemodynamics:  Ao = 139/105 (122) LV =  129/19  Assessment:  1. 3v CAD  2. SVG to D2/D3 is occluded 3. LIMA to LAD with Y-graft RIMA (off LIMA) to OM-1 widely patent 4, RCA totally occluded (chronic) 5. Persistent HTN  Total contrast: 60cc  Plan:  Graft to diagonal system is occluded. Other grafts patent. LVED 19. BP up. Will increase hydralazine to 75 tid. Hydrate gently. Likely home in am.  Bensimhon, Daniel,MD 12:39 PM

## 2016-01-03 NOTE — Progress Notes (Signed)
Site area: rt groin Site Prior to Removal:  Level 0 Pressure Applied For:  20 minutes Manual:   yes Patient Status During Pull: stable  Post Pull Site:  Level  0 Post Pull Instructions Given:  yes Post Pull Pulses Present: yes Dressing Applied:  tegaderm Bedrest begins @  [id number] Comments:
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
## 2016-01-03 NOTE — Interval H&P Note (Signed)
History and Physical Interval Note:  01/03/2016 11:42 AM  Scott Salinas  has presented today for surgery, with the diagnosis of unstable angina  The various methods of treatment have been discussed with the patient and family. After consideration of risks, benefits and other options for treatment, the patient has consented to  Procedure(s): Right/Left Heart Cath and Coronary/Graft Angiography (N/A) and possible angioplasty as a surgical intervention .  The patient's history has been reviewed, patient examined, no change in status, stable for surgery.  I have reviewed the patient's chart and labs.  Questions were answered to the patient's satisfaction.     Kiko Ripp, Quillian Quince

## 2016-01-03 NOTE — Progress Notes (Signed)
    The patient is scheduled for cardiac catheterization today. Creatinine is better than it has been at 1.6.  Daryel November, MD

## 2016-01-03 NOTE — Interval H&P Note (Signed)
Cath Lab Visit (complete for each Cath Lab visit)  Clinical Evaluation Leading to the Procedure:   ACS: No.  Non-ACS:    Anginal Classification: CCS IV  Anti-ischemic medical therapy: Minimal Therapy (1 class of medications)  Non-Invasive Test Results: No non-invasive testing performed  Prior CABG: Previous CABG      History and Physical Interval Note:  01/03/2016 11:43 AM  Scott Salinas  has presented today for surgery, with the diagnosis of unstable angina  The various methods of treatment have been discussed with the patient and family. After consideration of risks, benefits and other options for treatment, the patient has consented to  Procedure(s): Right/Left Heart Cath and Coronary/Graft Angiography (N/A) and possible angioplasty as a surgical intervention .  The patient's history has been reviewed, patient examined, no change in status, stable for surgery.  I have reviewed the patient's chart and labs.  Questions were answered to the patient's satisfaction.     Xander Jutras, Quillian Quince

## 2016-01-04 ENCOUNTER — Telehealth: Payer: Self-pay | Admitting: Physician Assistant

## 2016-01-04 DIAGNOSIS — I5023 Acute on chronic systolic (congestive) heart failure: Secondary | ICD-10-CM

## 2016-01-04 DIAGNOSIS — I5021 Acute systolic (congestive) heart failure: Secondary | ICD-10-CM

## 2016-01-04 DIAGNOSIS — R0789 Other chest pain: Secondary | ICD-10-CM

## 2016-01-04 DIAGNOSIS — Z951 Presence of aortocoronary bypass graft: Secondary | ICD-10-CM

## 2016-01-04 LAB — BASIC METABOLIC PANEL
Anion gap: 6 (ref 5–15)
BUN: 16 mg/dL (ref 6–20)
CO2: 25 mmol/L (ref 22–32)
Calcium: 8.6 mg/dL — ABNORMAL LOW (ref 8.9–10.3)
Chloride: 107 mmol/L (ref 101–111)
Creatinine, Ser: 1.67 mg/dL — ABNORMAL HIGH (ref 0.61–1.24)
GFR calc Af Amer: 56 mL/min — ABNORMAL LOW (ref >60)
GFR calc non Af Amer: 48 mL/min — ABNORMAL LOW (ref >60)
Glucose, Bld: 109 mg/dL — ABNORMAL HIGH (ref 65–99)
Potassium: 4.1 mmol/L (ref 3.5–5.1)
Sodium: 138 mmol/L (ref 135–145)

## 2016-01-04 MED ORDER — ATORVASTATIN CALCIUM 40 MG PO TABS: 40.0000 mg | ORAL_TABLET | Freq: Every day | ORAL | 0 refills | Status: DC

## 2016-01-04 MED ORDER — FUROSEMIDE 40 MG PO TABS: 40.0000 mg | ORAL_TABLET | Freq: Every day | ORAL | 0 refills | Status: DC

## 2016-01-04 MED ORDER — CARVEDILOL 25 MG PO TABS: 25.0000 mg | ORAL_TABLET | Freq: Two times a day (BID) | ORAL | 0 refills | Status: DC

## 2016-01-04 MED ORDER — FUROSEMIDE 10 MG/ML IJ SOLN
60.0000 mg | Freq: Two times a day (BID) | INTRAMUSCULAR | Status: DC
  Administered 2016-01-04: 60 mg via INTRAVENOUS
  Filled 2016-01-04: qty 6

## 2016-01-04 MED ORDER — NITROGLYCERIN 0.4 MG SL SUBL: 0.4000 mg | SUBLINGUAL_TABLET | SUBLINGUAL | 0 refills | Status: DC | PRN

## 2016-01-04 MED ORDER — GABAPENTIN 300 MG PO CAPS: 300.0000 mg | ORAL_CAPSULE | Freq: Three times a day (TID) | ORAL | 0 refills | Status: DC

## 2016-01-04 MED ORDER — CLONIDINE HCL ER 0.1 MG PO TB12: 0.1000 mg | ORAL_TABLET | Freq: Two times a day (BID) | ORAL | 0 refills | Status: DC

## 2016-01-04 MED ORDER — ISOSORBIDE MONONITRATE ER 30 MG PO TB24: 30.0000 mg | ORAL_TABLET | Freq: Every day | ORAL | 0 refills | Status: DC

## 2016-01-04 MED ORDER — HYDRALAZINE HCL 50 MG PO TABS: 75.0000 mg | ORAL_TABLET | Freq: Three times a day (TID) | ORAL | 0 refills | Status: DC

## 2016-01-04 MED ORDER — AMLODIPINE BESYLATE 10 MG PO TABS: 10.0000 mg | ORAL_TABLET | Freq: Every day | ORAL | 0 refills | Status: DC

## 2016-01-04 MED ORDER — POTASSIUM CHLORIDE CRYS ER 20 MEQ PO TBCR: 20.0000 meq | EXTENDED_RELEASE_TABLET | Freq: Two times a day (BID) | ORAL | 0 refills | Status: DC

## 2016-01-04 MED FILL — Verapamil HCl IV Soln 2.5 MG/ML: INTRAVENOUS | Qty: 2 | Status: AC

## 2016-01-04 NOTE — Progress Notes (Signed)
All d/c instructions explained and given to pt at 1200.  Verbalized understanding.  D/c off floor via w/c to awaiting transport.    Pt d/c off floor at 1216.  Karie Kirks, Therapist, sports.
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
## 2016-01-04 NOTE — Telephone Encounter (Signed)
TOC phone call .Marland Kitchen Appt on 01/14/16 at 2:30 am w/ Pharmacist and 3:00pm w/ Angelena Form .Marland Kitchen  Thanks

## 2016-01-04 NOTE — Progress Notes (Signed)
Patient Name: Scott Salinas Date of Encounter: 01/04/2016  Primary Cardiologist   Dr Meda Coffee  Patient Profile     45 y.o.year old malewith a history of HTN, CKD-3, CAD, MI in 2006 treated with DES to OM1 and BMS to LCx, CABG 01/2015 w/ LIMA-LAD, RIMA-OM1 (Y graft off LIMA), SVG-D2-D3, THC use, medication noncompliance. HTN urgency in 07/2015.   Admitted on 12/29/2015 with hypertensive urgency, chest pain and CHF. Diagnosed with new LV dysfunction 35-40% with regional WMA in the RCA/LCX territory, previously 55-60% and no regional WMA.   SUBJECTIVE  Intermittent SOB. No chest pain.   CURRENT MEDS . amLODipine  10 mg Oral Daily  . aspirin EC  81 mg Oral Daily  . atorvastatin  40 mg Oral q1800  . carvedilol  25 mg Oral BID WC  . cloNIDine HCl  0.1 mg Oral BID  . furosemide  60 mg Intravenous BID  . gabapentin  300 mg Oral TID  . heparin  5,000 Units Subcutaneous Q8H  . hydrALAZINE  75 mg Oral Q8H  . isosorbide mononitrate  30 mg Oral Daily  . polyethylene glycol  17 g Oral Daily  . potassium chloride  40 mEq Oral BID  . sodium chloride flush  3 mL Intravenous Q12H    OBJECTIVE  Vitals:   01/03/16 1315 01/03/16 1333 01/03/16 2032 01/04/16 0345  BP: (!) 158/99 (!) 147/101 (!) 164/101 (!) 160/100  Pulse: (!) 59 80 87 76  Resp: 12 18 20 20   Temp:  98.2 F (36.8 C) 98.2 F (36.8 C) 98.2 F (36.8 C)  TempSrc:  Oral Oral Oral  SpO2: 98% 99% 99% 100%  Weight:    271 lb 6.4 oz (123.1 kg)  Height:        Intake/Output Summary (Last 24 hours) at 01/04/16 0937 Last data filed at 01/04/16 0600  Gross per 24 hour  Intake             1680 ml  Output             2000 ml  Net             -320 ml   Filed Weights   01/02/16 0537 01/03/16 0401 01/04/16 0345  Weight: 265 lb 6.4 oz (120.4 kg) 268 lb 14.4 oz (122 kg) 271 lb 6.4 oz (123.1 kg)    PHYSICAL EXAM  General: Pleasant, NAD. Neuro: Alert and oriented X 3. Moves all extremities spontaneously. Psych: Normal  affect. HEENT:  Normal  Neck: Supple without bruits. + JVD. Lungs:  Resp regular and unlabored, diminished breath sound bibasilar. Heart: RRR no s3, s4, or murmurs. Abdomen: Soft, non-tender, non-distended, BS + x 4.  Extremities: No clubbing, cyanosis or edema. DP/PT/Radials 2+ and equal bilaterally. R groin cath site stable.   Accessory Clinical Findings  CBC  Recent Labs  01/03/16 1652  WBC 4.6  HGB 14.6  HCT 44.1  MCV 94.2  PLT [id number]   Basic Metabolic Panel  Recent Labs  01/03/16 0154 01/03/16 1652 01/04/16 0348  NA 138  --  138  K 3.9  --  4.1  CL 106  --  107  CO2 24  --  25  GLUCOSE 121*  --  109*  BUN 14  --  16  CREATININE 1.62* 1.63* 1.67*  CALCIUM 8.7*  --  8.6*   Liver Function Tests No results for input(s): AST, ALT, ALKPHOS, BILITOT, PROT, ALBUMIN in the last 72 hours. No results for input(s): LIPASE,  AMYLASE in the last 72 hours. Cardiac Enzymes No results for input(s): CKTOTAL, CKMB, CKMBINDEX, TROPONINI in the last 72 hours. BNP Invalid input(s): POCBNP D-Dimer No results for input(s): DDIMER in the last 72 hours. Hemoglobin A1C No results for input(s): HGBA1C in the last 72 hours. Fasting Lipid Panel No results for input(s): CHOL, HDL, LDLCALC, TRIG, CHOLHDL, LDLDIRECT in the last 72 hours. Thyroid Function Tests No results for input(s): TSH, T4TOTAL, T3FREE, THYROIDAB in the last 72 hours.  Invalid input(s): FREET3  TELE  Sinus rhythm  Radiology/Studies  Dg Chest 2 View  Result Date: 12/30/2015 CLINICAL DATA:  CHF, coronary artery disease, prior CABG. EXAM: CHEST  2 VIEW COMPARISON:  PA and lateral chest x-ray of December 29, 2015 FINDINGS: The lungs are well-expanded and clear. There is no interstitial or alveolar edema or pleural effusion. The cardiac silhouette is mildly enlarged though stable. The central pulmonary vascularity is prominent but also stable. The sternal wires are intact. The bony thorax exhibits no acute abnormality.  IMPRESSION: Stable mild cardiomegaly. No significant interstitial edema. Stable post CABG changes. Electronically Signed   By: David  Martinique M.D.   On: 12/30/2015 07:11   Dg Chest 2 View  Result Date: 12/29/2015 CLINICAL DATA:  Chest pain, shortness of breath, leg swelling for 3 days. EXAM: CHEST  2 VIEW COMPARISON:  Radiographs 07/29/2015 FINDINGS: Patient is post median sternotomy and CABG. Mild cardiomegaly. Unchanged mediastinal contours. Chronic blunting of the right costophrenic angle. No large pleural effusion. No pulmonary edema or focal airspace opacity. Unchanged osseous structures. IMPRESSION: Mild cardiomegaly. Post CABG. No congestive failure or acute abnormality. Electronically Signed   By: Jeb Levering M.D.   On: 12/29/2015 01:14    Cath 01/03/16 Left Heart Cath and Cors/Grafts Angiography  Conclusion     Prox RCA lesion, 100 %stenosed.  Prox Cx lesion, 50 %stenosed.  Prox LAD-1 lesion, 90 %stenosed.  Prox LAD-2 lesion, 70 %stenosed.  Mid LAD lesion, 50 %stenosed.  Seq SVG-.  Origin lesion, 100 %stenosed.  LIMA and is normal in caliber and anatomically normal.  RIMA and is normal in caliber and anatomically normal.  Ost 1st Mrg to 1st Mrg lesion, 95 %stenosed.    Hemodynamics:  Ao = 139/105 (122) LV =  129/19  Assessment:  1. 3v CAD  2. SVG to D2/D3 is occluded 3. LIMA to LAD with Y-graft RIMA (off LIMA) to OM-1 widely patent 4, RCA totally occluded (chronic) 5. Persistent HTN  Plan:  Graft to diagonal system is occluded. Other grafts patent. LVED 19. BP up. Will increase hydralazine to 75 tid. Hydrate gently. Likely home in am.     ASSESSMENT AND PLAN   1. CAD, s/p CABG  -  New CP and SOB and new LV dysfunction and regional WMA. Cath showed graft to diagonal system is occluded. Other grafts patent. LVED 19. Elevated BP.  Chronic occluded RCA. Dist RCA filled by collaterals from Dist LAD. - Continue ASA and statin.   2. Acute on  chronic combined systolic and diastolic CHF - Now has  New LV dysfunction. Cath report as above.  - Continue carvedilol, amlodipine, hydralazine, imdur.  No ACEI/ARB with acute on chronic kidney failure. Net I&O negative 14L. Weight is up 3lb since yesterday. + JVD. He is on lV lasix 60mg . Wants to go home today. He was on lasix 40mg  qd prior to admission. Likely needs higher discharge dose 40 or 60mg  BID. MD to decide discharge dose. Consider adding spironolactone. BMET in week.  4. Hypertensive urgency: adjusted medications. BP still elevated.   5. Acute on chronic kidney failure - Scr stable at 1.67   Dispo: Heart failure education given. *Weigh yourself on the same scale at same time of day and keep a log. *Report weight gain of > 3 lbs in 1 day or 5 lbs over the course of a week and/or symptoms of excess fluid (shortness of breath, difficulty lying flat, swelling, poor appetite, abdominal fullness/bloating, etc) to your doctor immediately. *Avoid foods that are high in sodium (processed, pre-packaged/canned goods, fast foods, etc). *Please attend all scheduled and reccommended follow up appointments   Signed, Bhagat,Bhavinkumar PA-C Pager [phone]  Agree with note by Robbie Lis PA-C  Pt admitted with new LV dysfunction and CHF. 1 year S/P CABG with an occluded seq Diag SVG. Pt continues to smoke. He has diuresed 14 L. Still has some orthopnea but no periiph edema and clear lungs. BP still elevated but this can be managed as OP. Prob OK to be DCd home today on BID lasix 40 mg (was on q day on admission). TOC 7 then F/U with Dr. Francie Massing, M.D., [place], [facility], [place] [address] [place], [place]  [address]  [phone] 01/04/2016 10:42 AM

## 2016-01-05 NOTE — Telephone Encounter (Signed)
F/u ° ° ° ° ° °Pt returning nurse call.  °

## 2016-01-06 NOTE — Telephone Encounter (Signed)
Returned call to patient no answer.No voice mail unable to leave a message.

## 2016-01-07 NOTE — Telephone Encounter (Signed)
TOC call attempt # 2  Phone did not ring. Went to VM. No VM set up.

## 2016-01-12 ENCOUNTER — Encounter: Payer: Self-pay | Admitting: Physician Assistant

## 2016-01-14 ENCOUNTER — Ambulatory Visit: Payer: Medicaid - Out of State | Admitting: Pharmacist

## 2016-01-14 ENCOUNTER — Ambulatory Visit: Payer: Medicaid - Out of State | Admitting: Physician Assistant

## 2016-01-14 ENCOUNTER — Encounter: Payer: Self-pay | Admitting: Physician Assistant
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
## 2016-01-19 ENCOUNTER — Encounter: Payer: Self-pay | Admitting: Physician Assistant

## 2016-02-15 ENCOUNTER — Ambulatory Visit: Payer: Medicaid - Out of State | Admitting: Family Medicine

## 2016-03-02 ENCOUNTER — Encounter (HOSPITAL_COMMUNITY): Payer: Self-pay | Admitting: *Deleted

## 2016-03-02 ENCOUNTER — Inpatient Hospital Stay (HOSPITAL_COMMUNITY)
Admission: EM | Admit: 2016-03-02 | Discharge: 2016-03-05 | DRG: 291 | Disposition: A | Discharge status: Home / Self Care | Payer: Medicaid Other | Attending: Internal Medicine | Admitting: Internal Medicine

## 2016-03-02 ENCOUNTER — Emergency Department (HOSPITAL_COMMUNITY): Payer: Medicaid Other

## 2016-03-02 DIAGNOSIS — Z23 Encounter for immunization: Secondary | ICD-10-CM

## 2016-03-02 DIAGNOSIS — R9431 Abnormal electrocardiogram [ECG] [EKG]: Secondary | ICD-10-CM

## 2016-03-02 DIAGNOSIS — Z8249 Family history of ischemic heart disease and other diseases of the circulatory system: Secondary | ICD-10-CM

## 2016-03-02 DIAGNOSIS — I25119 Atherosclerotic heart disease of native coronary artery with unspecified angina pectoris: Secondary | ICD-10-CM | POA: Diagnosis present

## 2016-03-02 DIAGNOSIS — Z9119 Patient's noncompliance with other medical treatment and regimen: Secondary | ICD-10-CM

## 2016-03-02 DIAGNOSIS — N179 Acute kidney failure, unspecified: Secondary | ICD-10-CM | POA: Diagnosis present

## 2016-03-02 DIAGNOSIS — N183 Chronic kidney disease, stage 3 unspecified: Secondary | ICD-10-CM | POA: Diagnosis present

## 2016-03-02 DIAGNOSIS — E785 Hyperlipidemia, unspecified: Secondary | ICD-10-CM | POA: Diagnosis present

## 2016-03-02 DIAGNOSIS — Z951 Presence of aortocoronary bypass graft: Secondary | ICD-10-CM

## 2016-03-02 DIAGNOSIS — I248 Other forms of acute ischemic heart disease: Secondary | ICD-10-CM | POA: Diagnosis present

## 2016-03-02 DIAGNOSIS — Z91199 Patient's noncompliance with other medical treatment and regimen due to unspecified reason: Secondary | ICD-10-CM

## 2016-03-02 DIAGNOSIS — F17210 Nicotine dependence, cigarettes, uncomplicated: Secondary | ICD-10-CM | POA: Diagnosis present

## 2016-03-02 DIAGNOSIS — I209 Angina pectoris, unspecified: Secondary | ICD-10-CM

## 2016-03-02 DIAGNOSIS — Z9114 Patient's other noncompliance with medication regimen: Secondary | ICD-10-CM

## 2016-03-02 DIAGNOSIS — I5043 Acute on chronic combined systolic (congestive) and diastolic (congestive) heart failure: Secondary | ICD-10-CM

## 2016-03-02 DIAGNOSIS — I130 Hypertensive heart and chronic kidney disease with heart failure and stage 1 through stage 4 chronic kidney disease, or unspecified chronic kidney disease: Principal | ICD-10-CM | POA: Diagnosis present

## 2016-03-02 DIAGNOSIS — N289 Disorder of kidney and ureter, unspecified: Secondary | ICD-10-CM

## 2016-03-02 DIAGNOSIS — Z7982 Long term (current) use of aspirin: Secondary | ICD-10-CM

## 2016-03-02 DIAGNOSIS — I252 Old myocardial infarction: Secondary | ICD-10-CM

## 2016-03-02 LAB — I-STAT TROPONIN, ED: TROPONIN I, POC: 0.06 ng/mL (ref 0.00–0.08)

## 2016-03-02 LAB — CBC
HCT: 45.4 % (ref 39.0–52.0)
HEMOGLOBIN: 15.3 g/dL (ref 13.0–17.0)
MCH: 30.7 pg (ref 26.0–34.0)
MCHC: 33.7 g/dL (ref 30.0–36.0)
MCV: 91.2 fL (ref 78.0–100.0)
PLATELETS: 212 10*3/uL (ref 150–400)
RBC: 4.98 MIL/uL (ref 4.22–5.81)
RDW: 15.7 % — ABNORMAL HIGH (ref 11.5–15.5)
WBC: 7.5 10*3/uL (ref 4.0–10.5)

## 2016-03-02 LAB — BASIC METABOLIC PANEL
ANION GAP: 9 (ref 5–15)
BUN: 15 mg/dL (ref 6–20)
CALCIUM: 8.6 mg/dL — AB (ref 8.9–10.3)
CO2: 23 mmol/L (ref 22–32)
CREATININE: 1.82 mg/dL — AB (ref 0.61–1.24)
Chloride: 104 mmol/L (ref 101–111)
GFR, EST AFRICAN AMERICAN: 50 mL/min — AB (ref >60)
GFR, EST NON AFRICAN AMERICAN: 43 mL/min — AB (ref >60)
Glucose, Bld: 124 mg/dL — ABNORMAL HIGH (ref 65–99)
Potassium: 3.5 mmol/L (ref 3.5–5.1)
Sodium: 136 mmol/L (ref 135–145)

## 2016-03-02 MED ORDER — FUROSEMIDE 10 MG/ML IJ SOLN
40.0000 mg | Freq: Once | INTRAMUSCULAR | Status: AC
  Administered 2016-03-03: 40 mg via INTRAVENOUS
  Filled 2016-03-02: qty 4
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
## 2016-03-02 NOTE — ED Triage Notes (Signed)
Pt here with SOB and pressure on his chest. Pt states that he needs to be upright to be able to breathe.  Pt has CHF and has been out of lasix for a week.  Pt took lasix again today but has not experienced any relief.  Pt states that he normally weighs 256-266lbslbs but does not weigh himself regularly, in triage he is 271lbs.

## 2016-03-03 DIAGNOSIS — E785 Hyperlipidemia, unspecified: Secondary | ICD-10-CM | POA: Diagnosis present

## 2016-03-03 DIAGNOSIS — Z23 Encounter for immunization: Secondary | ICD-10-CM | POA: Diagnosis not present

## 2016-03-03 DIAGNOSIS — Z9114 Patient's other noncompliance with medication regimen: Secondary | ICD-10-CM

## 2016-03-03 DIAGNOSIS — Z8249 Family history of ischemic heart disease and other diseases of the circulatory system: Secondary | ICD-10-CM | POA: Diagnosis not present

## 2016-03-03 DIAGNOSIS — Z7982 Long term (current) use of aspirin: Secondary | ICD-10-CM

## 2016-03-03 DIAGNOSIS — F1210 Cannabis abuse, uncomplicated: Secondary | ICD-10-CM

## 2016-03-03 DIAGNOSIS — N189 Chronic kidney disease, unspecified: Secondary | ICD-10-CM | POA: Diagnosis not present

## 2016-03-03 DIAGNOSIS — F17210 Nicotine dependence, cigarettes, uncomplicated: Secondary | ICD-10-CM

## 2016-03-03 DIAGNOSIS — I25119 Atherosclerotic heart disease of native coronary artery with unspecified angina pectoris: Secondary | ICD-10-CM

## 2016-03-03 DIAGNOSIS — Z79899 Other long term (current) drug therapy: Secondary | ICD-10-CM

## 2016-03-03 DIAGNOSIS — I248 Other forms of acute ischemic heart disease: Secondary | ICD-10-CM | POA: Diagnosis present

## 2016-03-03 DIAGNOSIS — R079 Chest pain, unspecified: Secondary | ICD-10-CM | POA: Diagnosis present

## 2016-03-03 DIAGNOSIS — Z951 Presence of aortocoronary bypass graft: Secondary | ICD-10-CM

## 2016-03-03 DIAGNOSIS — I130 Hypertensive heart and chronic kidney disease with heart failure and stage 1 through stage 4 chronic kidney disease, or unspecified chronic kidney disease: Secondary | ICD-10-CM | POA: Diagnosis present

## 2016-03-03 DIAGNOSIS — I252 Old myocardial infarction: Secondary | ICD-10-CM | POA: Diagnosis not present

## 2016-03-03 DIAGNOSIS — N183 Chronic kidney disease, stage 3 (moderate): Secondary | ICD-10-CM

## 2016-03-03 DIAGNOSIS — N179 Acute kidney failure, unspecified: Secondary | ICD-10-CM | POA: Diagnosis present

## 2016-03-03 DIAGNOSIS — I5023 Acute on chronic systolic (congestive) heart failure: Secondary | ICD-10-CM

## 2016-03-03 DIAGNOSIS — Z888 Allergy status to other drugs, medicaments and biological substances status: Secondary | ICD-10-CM

## 2016-03-03 DIAGNOSIS — I5043 Acute on chronic combined systolic (congestive) and diastolic (congestive) heart failure: Secondary | ICD-10-CM | POA: Diagnosis present

## 2016-03-03 DIAGNOSIS — Z9119 Patient's noncompliance with other medical treatment and regimen: Secondary | ICD-10-CM | POA: Diagnosis not present

## 2016-03-03 DIAGNOSIS — R9431 Abnormal electrocardiogram [ECG] [EKG]: Secondary | ICD-10-CM

## 2016-03-03 LAB — BASIC METABOLIC PANEL
ANION GAP: 9 (ref 5–15)
Anion gap: 9 (ref 5–15)
BUN: 16 mg/dL (ref 6–20)
BUN: 14 mg/dL (ref 6–20)
CALCIUM: 8.6 mg/dL — AB (ref 8.9–10.3)
CALCIUM: 8.9 mg/dL (ref 8.9–10.3)
CHLORIDE: 105 mmol/L (ref 101–111)
CHLORIDE: 104 mmol/L (ref 101–111)
CO2: 24 mmol/L (ref 22–32)
CO2: 24 mmol/L (ref 22–32)
CREATININE: 1.69 mg/dL — AB (ref 0.61–1.24)
Creatinine, Ser: 1.79 mg/dL — ABNORMAL HIGH (ref 0.61–1.24)
GFR calc non Af Amer: 44 mL/min — ABNORMAL LOW (ref >60)
GFR, EST AFRICAN AMERICAN: 51 mL/min — AB (ref >60)
GFR, EST AFRICAN AMERICAN: 55 mL/min — AB (ref >60)
GFR, EST NON AFRICAN AMERICAN: 47 mL/min — AB (ref >60)
Glucose, Bld: 98 mg/dL (ref 65–99)
Glucose, Bld: 105 mg/dL — ABNORMAL HIGH (ref 65–99)
Potassium: 3.6 mmol/L (ref 3.5–5.1)
Potassium: 3.5 mmol/L (ref 3.5–5.1)
SODIUM: 137 mmol/L (ref 135–145)
Sodium: 138 mmol/L (ref 135–145)

## 2016-03-03 LAB — TROPONIN I
TROPONIN I: 0.1 ng/mL — AB (ref <0.03)
TROPONIN I: 0.08 ng/mL — AB (ref <0.03)
Troponin I: 0.13 ng/mL (ref <0.03)

## 2016-03-03 LAB — MAGNESIUM: MAGNESIUM: 1.8 mg/dL (ref 1.7–2.4)

## 2016-03-03 LAB — MRSA PCR SCREENING: MRSA by PCR: NEGATIVE

## 2016-03-03 LAB — BRAIN NATRIURETIC PEPTIDE: B NATRIURETIC PEPTIDE 5: 930.4 pg/mL — AB (ref 0.0–100.0)

## 2016-03-03 MED ORDER — NITROGLYCERIN 0.4 MG SL SUBL: 0.4000 mg | SUBLINGUAL_TABLET | SUBLINGUAL | Status: DC | PRN

## 2016-03-03 MED ORDER — CLONIDINE HCL 0.1 MG PO TABS
0.1000 mg | ORAL_TABLET | Freq: Two times a day (BID) | ORAL | Status: DC
  Administered 2016-03-03 – 2016-03-05 (×5): 0.1 mg via ORAL
  Filled 2016-03-03 (×5): qty 1

## 2016-03-03 MED ORDER — HYDRALAZINE HCL 50 MG PO TABS
75.0000 mg | ORAL_TABLET | Freq: Three times a day (TID) | ORAL | Status: DC
  Administered 2016-03-03 – 2016-03-05 (×8): 75 mg via ORAL
  Filled 2016-03-03 (×8): qty 1

## 2016-03-03 MED ORDER — POTASSIUM CHLORIDE CRYS ER 20 MEQ PO TBCR
40.0000 meq | EXTENDED_RELEASE_TABLET | Freq: Once | ORAL | Status: AC
  Administered 2016-03-03: 40 meq via ORAL
  Filled 2016-03-03: qty 2

## 2016-03-03 MED ORDER — SODIUM CHLORIDE 0.9% FLUSH
3.0000 mL | Freq: Two times a day (BID) | INTRAVENOUS | Status: DC
  Administered 2016-03-03 – 2016-03-05 (×6): 3 mL via INTRAVENOUS

## 2016-03-03 MED ORDER — INFLUENZA VAC SPLIT QUAD 0.5 ML IM SUSY
0.5000 mL | PREFILLED_SYRINGE | INTRAMUSCULAR | Status: AC
  Administered 2016-03-04: 0.5 mL via INTRAMUSCULAR
  Filled 2016-03-03: qty 0.5

## 2016-03-03 MED ORDER — ISOSORBIDE MONONITRATE ER 30 MG PO TB24
30.0000 mg | ORAL_TABLET | Freq: Every day | ORAL | Status: DC
  Administered 2016-03-03 – 2016-03-05 (×3): 30 mg via ORAL
  Filled 2016-03-03 (×3): qty 1

## 2016-03-03 MED ORDER — CARVEDILOL 25 MG PO TABS: 25.0000 mg | ORAL_TABLET | Freq: Two times a day (BID) | ORAL | Status: DC

## 2016-03-03 MED ORDER — CYCLOBENZAPRINE HCL 10 MG PO TABS
5.0000 mg | ORAL_TABLET | Freq: Three times a day (TID) | ORAL | Status: DC | PRN
  Administered 2016-03-03: 5 mg via ORAL
  Administered 2016-03-03: 0.5 mg via ORAL
  Administered 2016-03-04: 5 mg via ORAL
  Filled 2016-03-03 (×2): qty 1

## 2016-03-03 MED ORDER — HEPARIN SODIUM (PORCINE) 5000 UNIT/ML IJ SOLN: 5000.0000 [IU] | Freq: Three times a day (TID) | INTRAMUSCULAR | Status: DC

## 2016-03-03 MED ORDER — ASPIRIN EC 81 MG PO TBEC
81.0000 mg | DELAYED_RELEASE_TABLET | Freq: Every day | ORAL | Status: DC
  Administered 2016-03-03 – 2016-03-05 (×3): 81 mg via ORAL
  Filled 2016-03-03 (×3): qty 1

## 2016-03-03 MED ORDER — MORPHINE SULFATE (PF) 4 MG/ML IV SOLN
1.0000 mg | INTRAVENOUS | Status: DC | PRN
  Administered 2016-03-03 – 2016-03-04 (×5): 1 mg via INTRAVENOUS
  Filled 2016-03-03 (×6): qty 1

## 2016-03-03 MED ORDER — FUROSEMIDE 10 MG/ML IJ SOLN
40.0000 mg | Freq: Two times a day (BID) | INTRAMUSCULAR | Status: DC
  Administered 2016-03-03 – 2016-03-04 (×3): 40 mg via INTRAVENOUS
  Filled 2016-03-03 (×3): qty 4

## 2016-03-03 MED ORDER — HEPARIN SODIUM (PORCINE) 5000 UNIT/ML IJ SOLN
5000.0000 [IU] | Freq: Three times a day (TID) | INTRAMUSCULAR | Status: DC
  Administered 2016-03-03 – 2016-03-05 (×6): 5000 [IU] via SUBCUTANEOUS
  Filled 2016-03-03 (×6): qty 1

## 2016-03-03 MED ORDER — CYCLOBENZAPRINE HCL 10 MG PO TABS
5.0000 mg | ORAL_TABLET | Freq: Once | ORAL | Status: DC | PRN
  Filled 2016-03-03: qty 1

## 2016-03-03 MED ORDER — ATORVASTATIN CALCIUM 40 MG PO TABS
40.0000 mg | ORAL_TABLET | Freq: Every day | ORAL | Status: DC
  Administered 2016-03-03 – 2016-03-04 (×2): 40 mg via ORAL
  Filled 2016-03-03 (×2): qty 1

## 2016-03-03 MED ORDER — ALBUTEROL SULFATE (2.5 MG/3ML) 0.083% IN NEBU: 2.5000 mg | INHALATION_SOLUTION | Freq: Four times a day (QID) | RESPIRATORY_TRACT | Status: DC | PRN

## 2016-03-03 MED ORDER — GABAPENTIN 300 MG PO CAPS
300.0000 mg | ORAL_CAPSULE | Freq: Three times a day (TID) | ORAL | Status: DC
  Administered 2016-03-03 – 2016-03-05 (×7): 300 mg via ORAL
  Filled 2016-03-03 (×7): qty 1
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
## 2016-03-03 NOTE — Progress Notes (Signed)
03/03/2016 patient had vent [place] at1002, patient was asymptomatic. Internal medicine teaching service was made aware at 1200. [facility] RN.

## 2016-03-03 NOTE — Progress Notes (Signed)
Was paged by the nurse about right sided wheezing and shortness of breath.  I went to evaluate the patient.  He was sleeping when I arrived.  He told me that his breathing is the same from when we had admitted him earlier at around 2am.  He states it may have improved slightly with the lasix.  On exam I did not hear wheezing.  He is being admitted for CHF exacerbation and being treated with lasix.  Upon further review of the patient's chart he had PFTs on 01/18/15 that suggested minimal obstructive airway disease.  I will add albuterol PRN in case patient begins to wheeze again.  I will notify the morning team.

## 2016-03-03 NOTE — Care Management Note (Signed)
Case Management Note  Patient Details  Name: Demonie Kassa MRN: [id number] Date of Birth: Oct 30, 1970  Subjective/Objective:  Referral received to assist pt in finding PCP and transportation to get meds.  Met with pt, he stated he did not want a PCP locally, he plans to drive to [place] to follow with his doctors there.  Questioned pt about any problem he might have picking up meds, he stated he had no problem - he drove to the [facility] to pick up his prescriptions, the only problem he had was that he did not get a script for gabapentin.  Also stated he gets most scripts filled with no copay, infrequently pays $1.60 for 30-day supply.               Expected Discharge Plan:  Home/Self Care  Discharge planning Services  CM Consult  Status of Service:  Completed, signed off  Girard Cooter, [place] 03/03/2016, 1:55 PM

## 2016-03-03 NOTE — Progress Notes (Signed)
Critical lab troponin level 0.13 called to MD at 0325 and 0335. MD called stated to monitor at this time. Spoke with MD due to pt BP 135/118 and checking multiple times, MD aware and states to monitor at this time. Also spoke about pain control for pts back pain and MD states to stay with 1mg  morphine IV q4 PRN at this time and will relay to day docs

## 2016-03-03 NOTE — ED Notes (Signed)
MD at bedside. 

## 2016-03-03 NOTE — ED Notes (Signed)
Spoke with admitting MD about pt having active CP and need for pain meds
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
## 2016-03-04 DIAGNOSIS — I130 Hypertensive heart and chronic kidney disease with heart failure and stage 1 through stage 4 chronic kidney disease, or unspecified chronic kidney disease: Principal | ICD-10-CM

## 2016-03-04 DIAGNOSIS — I2510 Atherosclerotic heart disease of native coronary artery without angina pectoris: Secondary | ICD-10-CM

## 2016-03-04 DIAGNOSIS — N189 Chronic kidney disease, unspecified: Secondary | ICD-10-CM

## 2016-03-04 DIAGNOSIS — E785 Hyperlipidemia, unspecified: Secondary | ICD-10-CM

## 2016-03-04 LAB — MAGNESIUM: Magnesium: 2.1 mg/dL (ref 1.7–2.4)

## 2016-03-04 LAB — BASIC METABOLIC PANEL
Anion gap: 9 (ref 5–15)
BUN: 17 mg/dL (ref 6–20)
CALCIUM: 9.4 mg/dL (ref 8.9–10.3)
CO2: 27 mmol/L (ref 22–32)
CREATININE: 1.74 mg/dL — AB (ref 0.61–1.24)
Chloride: 103 mmol/L (ref 101–111)
GFR calc Af Amer: 53 mL/min — ABNORMAL LOW (ref >60)
GFR calc non Af Amer: 46 mL/min — ABNORMAL LOW (ref >60)
GLUCOSE: 119 mg/dL — AB (ref 65–99)
Potassium: 3.7 mmol/L (ref 3.5–5.1)
Sodium: 139 mmol/L (ref 135–145)

## 2016-03-04 MED ORDER — CARVEDILOL 12.5 MG PO TABS
12.5000 mg | ORAL_TABLET | Freq: Two times a day (BID) | ORAL | Status: DC
Start: 2016-03-04 — End: 2016-03-05
  Administered 2016-03-04 – 2016-03-05 (×3): 12.5 mg via ORAL
  Filled 2016-03-04 (×3): qty 1

## 2016-03-04 MED ORDER — CYCLOBENZAPRINE HCL 10 MG PO TABS
10.0000 mg | ORAL_TABLET | Freq: Three times a day (TID) | ORAL | Status: DC | PRN
  Administered 2016-03-04 – 2016-03-05 (×4): 10 mg via ORAL
  Filled 2016-03-04 (×4): qty 1

## 2016-03-04 MED ORDER — LORAZEPAM 0.5 MG PO TABS
0.5000 mg | ORAL_TABLET | Freq: Four times a day (QID) | ORAL | Status: DC | PRN
  Administered 2016-03-04 – 2016-03-05 (×3): 0.5 mg via ORAL
  Filled 2016-03-04 (×3): qty 1

## 2016-03-04 MED ORDER — FUROSEMIDE 40 MG PO TABS
40.0000 mg | ORAL_TABLET | Freq: Two times a day (BID) | ORAL | Status: DC
  Administered 2016-03-04 – 2016-03-05 (×2): 40 mg via ORAL
  Filled 2016-03-04 (×2): qty 1

## 2016-03-04 MED ORDER — FUROSEMIDE 40 MG PO TABS: 40.0000 mg | ORAL_TABLET | Freq: Every day | ORAL | Status: DC

## 2016-03-04 MED ORDER — HYDROCODONE-ACETAMINOPHEN 10-325 MG PO TABS
1.0000 | ORAL_TABLET | Freq: Four times a day (QID) | ORAL | Status: DC | PRN
  Administered 2016-03-04 – 2016-03-05 (×4): 1 via ORAL
  Filled 2016-03-04 (×4): qty 1

## 2016-03-04 NOTE — Progress Notes (Addendum)
   Subjective: Patient is complaining of diffuse muscle cramps all over his body. He is breathing well off oxygen. SOB improved.  Objective:  Vital signs in last 24 hours: Vitals:   03/04/16 0433 03/04/16 0500 03/04/16 0613 03/04/16 0730  BP: (!) 142/120  (!) 149/116   Pulse: 81     Resp: (!) 32     Temp:    (!) 96.6 F (35.9 C)  TempSrc:    Axillary  SpO2: 99%     Weight:  245 lb 6 oz (111.3 kg)    Height:       Physical Exam Constitutional: NAD, appears comfortable Cardiovascular: RRR, no murmurs, rubs, or gallops.  Pulmonary/Chest: CTAB, no wheezes, rales, or rhonchi.  Abdominal: Soft, non tender, non distended. +BS.  Extremities: Warm and well perfused. Distal pulses intact. No edema.   Assessment/Plan:  Acute on Chronic HFrEF: Last ECHO in August with LV EF 35-40%. Patient presented with SOB found to be hypervolemic on exam in the setting of medication non compliance. Patient is responding well to IV lasix, down 26 lbs since admission. Net negative 2L over the past 24 hours. Breathing has improved.  -- Change lasix PO 40 mg BID (home dose is 40mg  daily) -- Continue Hydralazine 75 mg Q8 hrs -- Restart home coreg at lower dose; 12.5 mg BID (home dose is 25mg  BID) -- Daily weight, strict I/Os, fluid restrict  -- Transfer to Med-Surg  Acute on Chronic Kidney Disease: Secondary to CHF exacerbation. Basline creatinine ~ 1.6. Creatine this morning is pending. Has been improving with diuresis.  -- Continue lasix -- Check mag -- Daily MBP  CAD: Hx of CABG in 2006 and recent cath 12/2015. Troponin elevated on admission 0.13 -> 0.10 -> 0.08. Likely secondary to CHF exacerbation. Patient denies chest pain.  -- ASA 81 daily, Statin -- SL nitro prn for chest pain   HTN: BP last 24 hours has ranged from 133-171/84-101 - continue hydralazine 75mg  Q8H - continue Imdur 30mg  daily - continue Clonidine 0.1mg  BID  HLD: -- Lipitor 40 mg daily   Muscle Cramps: Potassium today pending.  Encouraged patient to get out of bed today and walk around. Mag level pending.  -- Flexeril increased to 10 mg TID prn  -- Continue home gabapentin 300 mg TID  Dispo: Anticipated discharge in approximately 1-2 days.   Jule Ser, DO 03/04/2016, 8:33 AM Pager: [phone]

## 2016-03-04 NOTE — Progress Notes (Signed)
03/04/2016 influenza vaccine given in  left deltoid at 1153. Lot # [id number] and expire 10/12/2016. [facility] RN.
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
## 2016-03-05 DIAGNOSIS — I4581 Long QT syndrome: Secondary | ICD-10-CM

## 2016-03-05 LAB — BASIC METABOLIC PANEL
ANION GAP: 7 (ref 5–15)
BUN: 19 mg/dL (ref 6–20)
CALCIUM: 9.1 mg/dL (ref 8.9–10.3)
CO2: 27 mmol/L (ref 22–32)
Chloride: 103 mmol/L (ref 101–111)
Creatinine, Ser: 1.76 mg/dL — ABNORMAL HIGH (ref 0.61–1.24)
GFR, EST AFRICAN AMERICAN: 52 mL/min — AB (ref >60)
GFR, EST NON AFRICAN AMERICAN: 45 mL/min — AB (ref >60)
GLUCOSE: 98 mg/dL (ref 65–99)
POTASSIUM: 3.5 mmol/L (ref 3.5–5.1)
Sodium: 137 mmol/L (ref 135–145)

## 2016-03-05 LAB — MAGNESIUM: MAGNESIUM: 2.3 mg/dL (ref 1.7–2.4)

## 2016-03-05 MED ORDER — HYDROCODONE-ACETAMINOPHEN 10-325 MG PO TABS: 1.0000 | ORAL_TABLET | Freq: Four times a day (QID) | ORAL | 0 refills | Status: DC | PRN

## 2016-03-05 MED ORDER — ISOSORBIDE MONONITRATE ER 30 MG PO TB24: 30.0000 mg | ORAL_TABLET | Freq: Every day | ORAL | 0 refills | Status: DC

## 2016-03-05 MED ORDER — ATORVASTATIN CALCIUM 40 MG PO TABS: 40.0000 mg | ORAL_TABLET | Freq: Every day | ORAL | 0 refills | Status: DC

## 2016-03-05 MED ORDER — CARVEDILOL 25 MG PO TABS: 25.0000 mg | ORAL_TABLET | Freq: Two times a day (BID) | ORAL | 0 refills | Status: DC

## 2016-03-05 MED ORDER — GABAPENTIN 300 MG PO CAPS: 300.0000 mg | ORAL_CAPSULE | Freq: Three times a day (TID) | ORAL | 0 refills | Status: DC

## 2016-03-05 MED ORDER — CLONIDINE HCL ER 0.1 MG PO TB12: 0.1000 mg | ORAL_TABLET | Freq: Two times a day (BID) | ORAL | 0 refills | Status: DC

## 2016-03-05 MED ORDER — POTASSIUM CHLORIDE CRYS ER 20 MEQ PO TBCR: 20.0000 meq | EXTENDED_RELEASE_TABLET | Freq: Two times a day (BID) | ORAL | 0 refills | Status: DC

## 2016-03-05 MED ORDER — AMLODIPINE BESYLATE 10 MG PO TABS: 10.0000 mg | ORAL_TABLET | Freq: Every day | ORAL | 0 refills | Status: DC

## 2016-03-05 MED ORDER — FUROSEMIDE 40 MG PO TABS: 40.0000 mg | ORAL_TABLET | Freq: Every day | ORAL | 0 refills | Status: DC

## 2016-03-05 MED ORDER — HYDRALAZINE HCL 50 MG PO TABS: 50.0000 mg | ORAL_TABLET | Freq: Three times a day (TID) | ORAL | 0 refills | Status: DC

## 2016-03-05 NOTE — Discharge Summary (Signed)
Name: Scott Salinas MRN: [id number] DOB: [birth year] 45 y.o. PCP: No Pcp Per Patient  Date of Admission: 03/02/2016 11:16 PM Date of Discharge: 03/05/2016 Attending Physician: Sid Falcon, MD  Discharge Diagnosis:  Principal Problem:   CHF exacerbation [facility]) Active Problems:   History of noncompliance with medical treatment   CKD (chronic kidney disease) stage 3, GFR 30-59 ml/min   S/P CABG x 4   CAD (coronary artery disease)   CHF (congestive heart failure) (HCC)   Prolonged Q-T interval on ECG   Renal insufficiency   Discharge Medications:   Medication List    TAKE these medications   acetaminophen 325 MG tablet Commonly known as:  TYLENOL Take 1 tablet (325 mg total) by mouth every 4 (four) hours as needed for mild pain, moderate pain or headache.   amLODipine 10 MG tablet Commonly known as:  NORVASC Take 1 tablet (10 mg total) by mouth daily.   aspirin EC 81 MG tablet Take 1 tablet (81 mg total) by mouth daily.   atorvastatin 40 MG tablet Commonly known as:  LIPITOR Take 1 tablet (40 mg total) by mouth daily at 6 PM.   carvedilol 25 MG tablet Commonly known as:  COREG Take 1 tablet (25 mg total) by mouth 2 (two) times daily with a meal.   cloNIDine HCl 0.1 MG Tb12 ER tablet Commonly known as:  KAPVAY Take 1 tablet (0.1 mg total) by mouth 2 (two) times daily.   furosemide 40 MG tablet Commonly known as:  LASIX Take 1 tablet (40 mg total) by mouth daily.   gabapentin 300 MG capsule Commonly known as:  NEURONTIN Take 1 capsule (300 mg total) by mouth 3 (three) times daily.   hydrALAZINE 50 MG tablet Commonly known as:  APRESOLINE Take 1 tablet (50 mg total) by mouth every 8 (eight) hours.   HYDROcodone-acetaminophen 10-325 MG tablet Commonly known as:  NORCO Take 1 tablet by mouth every 6 (six) hours as needed for moderate pain.   isosorbide mononitrate 30 MG 24 hr tablet Commonly known as:  IMDUR Take 1 tablet (30 mg total) by mouth  daily.   nitroGLYCERIN 0.4 MG SL tablet Commonly known as:  NITROSTAT Place 1 tablet (0.4 mg total) under the tongue every 5 (five) minutes as needed for chest pain.   potassium chloride SA 20 MEQ tablet Commonly known as:  K-DUR,KLOR-CON Take 1 tablet (20 mEq total) by mouth 2 (two) times daily. Take with your Lasix. What changed:  how much to take  when to take this  additional instructions       Disposition and follow-up:   Mr.Scott Salinas was discharged from [facility] in Stable condition.  At the hospital follow up visit please address:  1.  Patients adherence with medications and follow up with cardiology, signs and/or symptoms of worsening cardiac function, and chronic pain.  2.  Labs / imaging needed at time of follow-up: BMET  3.  Pending labs/ test needing follow-up: none  Follow-up Appointments: Follow-up Information    Ena Dawley, MD. Call today.   Specialty:  Cardiology Why:  for a hospital follow up appointment. Contact information: [address] [place] [address] [phone]        [place]. Call today.   Why:  to make an appointment for primary care. Contact information: [place] [address] [phone]  Course by problem list: Principal Problem:   CHF exacerbation ([place]) Active Problems:   History of noncompliance with medical treatment   CKD (chronic kidney disease) stage 3, GFR 30-59 ml/min   S/P CABG x 4   CAD (coronary artery disease)   CHF (congestive heart failure) (HCC)   Prolonged Q-T interval on ECG   Renal insufficiency  Acute on Chronic HFrEF: Last ECHO in August with LV EF 35-40%. Patient presented with SOB found to be hypervolemic on exam in the setting of medication non compliance.  His estimated dry weight is approximately 260 pounds and he presented at 271 pounds.  Patient responded well  to IV Lasix and was transitioned to PO Lasix prior to discharge.  His BB was initially held due to non-adherence.  We added back his BB at half dose prior to discharge and then resumed home dosing upon discharge.  His discharge weight is at 258 pounds and net negative approximately 5 liters since admission. Advised follow up with his cardiologist outpatient.  Stated he did not need any refills of his HF meds.  Regardless, will send a month supply to his pharmacy as non-compliance contributed to his admission.  Acute on Chronic Kidney Disease: Secondary to CHF exacerbation. Basline creatinine ~ 1.6. Creatine at discharge is mildly elevated above this to 1.7 but improved from his admission.  Magnesium is normal.  CAD: Hx of CABG in 2006 and recent cath 12/2015. Troponin elevated on admission 0.13 -> 0.10 -> 0.08. Likely secondary to CHF exacerbation. Patient denies chest pain. Continue ASA 81 daily, Statin daily at discharge.  HTN: Elevated some during admission with SBP in the 150s.  We continued hydralazine 75mg  Q8H, Imdur 30mg  daily, and Clonidine 0.1mg  BID.  We resumed his Amlodipine at discharge.  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  Also uses Gabapentin for nerve pain.  Asking for a refill of both at discharge.  We will provide a gabapentin prescription for 1 month and a 5-day supply of his Norco, both of which have been controlling his symptoms over the last 24 hours.   Discharge Vitals:   BP (!) 150/109 (BP Location: Left Arm)   Pulse 99   Temp 97.9 F (36.6 C) (Oral)   Resp 18   Ht 5\' 9"  (1.753 m)   Wt 258 lb 13.1 oz (117.4 kg)   SpO2 92%   BMI 38.22 kg/m   Pertinent Labs, Studies, and Procedures:  Chest X-ray : Stable cardiomegaly with post CABG change. Slight decrease in pulmonary vascular congestion since prior.  BMP Latest Ref Rng & Units  03/05/2016 03/04/2016 03/03/2016  Glucose 65 - 99 mg/dL 98 119(H) 105(H)  BUN 6 - 20 mg/dL 19 17 14   Creatinine 0.61 - 1.24 mg/dL 1.76(H) 1.74(H) 1.69(H)  Sodium 135 - 145 mmol/L 137 139 137  Potassium 3.5 - 5.1 mmol/L 3.5 3.7 3.5  Chloride 101 - 111 mmol/L 103 103 104  CO2 22 - 32 mmol/L 27 27 24   Calcium 8.9 - 10.3 mg/dL 9.1 9.4 8.9   CBC Latest Ref Rng & Units 03/02/2016 01/03/2016 12/30/2015  WBC 4.0 - 10.5 K/uL 7.5 4.6 6.6  Hemoglobin 13.0 - 17.0 g/dL 15.3 14.6 15.4  Hematocrit 39.0 - 52.0 % 45.4 44.1 47.3  Platelets 150 - 400 K/uL 212 242 251     Discharge Instructions: Discharge Instructions    (HEART  FAILURE PATIENTS) Call MD:  Anytime you have any of the following symptoms: 1) 3 pound weight gain in 24 hours or 5 pounds in 1 week 2) shortness of breath, with or without a dry hacking cough 3) swelling in the hands, feet or stomach 4) if you have to sleep on extra pillows at night in order to breathe.    Complete by:  As directed    Diet - low sodium heart healthy    Complete by:  As directed    Discharge instructions    Complete by:  As directed    Mr. Meggett,  We have provided you with a 30 day supply of your medications for your heart disease and blood pressure.   These were sent to your pharmacy.  We also gave you a 30-day supply of Gabapentin for your nerve type pain and a short course of your pain medications.  Please contact your cardiologist (Dr. Meda Coffee) to schedule a hospital follow up appointment.  Please also try contacting [facility] to establish with a Primary Care doctor.  Take care.   Increase activity slowly    Complete by:  As directed       Signed: Jule Ser, DO 03/05/2016, 10:00 AM   Pager: [phone]

## 2016-03-05 NOTE — Care Management (Signed)
CM met with patient at bedside to discuss care transitional planning. Patient reports is visiting a friend from [place] but plans to return. Patient denies any difficulties obtaining medication, and will be transported home via private vehicle. No further CM needs identified.

## 2016-03-05 NOTE — Progress Notes (Signed)
   Subjective: Patient is doing well this morning.  Asking if he will get to go home today.  Lying flat in the bed with no SOB.  He is asking about his pain medications at discharge.  Objective:  Vital signs in last 24 hours: Vitals:   03/04/16 1635 03/04/16 2039 03/05/16 0517 03/05/16 0840  BP: (!) 161/92 (!) 155/113 (!) 156/99 (!) 150/109  Pulse: 86 (!) 40 83 99  Resp:  20 19 18   Temp:  97.8 F (36.6 C) 97.5 F (36.4 C) 97.9 F (36.6 C)  TempSrc:  Oral Oral Oral  SpO2: 98% 96% 97% 92%  Weight:  258 lb 13.1 oz (117.4 kg)    Height:  5\' 9"  (1.753 m)     Physical Exam Constitutional: NAD, appears comfortable, lying flat Cardiovascular: RRR, no murmurs, rubs, or gallops.  Pulmonary/Chest: normal effort, CTAB, no wheezes, rales, or rhonchi.  Extremities: Warm and well perfused.  No edema.   Assessment/Plan:  Acute on Chronic HFrEF: Last ECHO in August with LV EF 35-40%. Patient presented with SOB found to be hypervolemic on exam in the setting of medication non compliance. Patient responded well to IV Lasix and was transitioned to PO Lasix yesterday.  Overall, he is at 258 pounds this morning and net negative approximately 5 liters since admission.  He tolerated adding back his beta blocker yesterday. -- Discharge home today on home regimen -- Advised follow up with his cardiologist outpatient -- Stated he did not need any refills of his HF meds.  Regardless, will send a month supply to his pharmacy as non-compliance contributed to his admission.  Acute on Chronic Kidney Disease: Secondary to CHF exacerbation. Basline creatinine ~ 1.6. Creatine this morning is mildly elevated above this to 1.7 but improved from his admission.  Magnesium is normal.  CAD: Hx of CABG in 2006 and recent cath 12/2015. Troponin elevated on admission 0.13 -> 0.10 -> 0.08. Likely secondary to CHF exacerbation. Patient denies chest pain. Continue ASA 81 daily, Statin daily at discharge.  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  Also uses Gabapentin for nerve pain.  Asking for a refill of both at discharge.  We will provide a gabapentin prescription for 1 month and a 5-day supply of his Norco, both of which have been controlling his symptoms over the last 24 hours.  Dispo: Home today.  Jule Ser, DO 03/05/2016, 9:37 AM Pager: [phone]

## 2016-03-05 NOTE — Progress Notes (Signed)
Pharmacist Heart Failure Core Measure Documentation  Assessment: Scott Salinas has an EF documented as 35-40% on 12/31/15 by ECHO.  Rationale: Heart failure patients with left ventricular systolic dysfunction (LVSD) and an EF < 40% should be prescribed an angiotensin converting enzyme inhibitor (ACEI) or angiotensin receptor blocker (ARB) at discharge unless a contraindication is documented in the medical record.  This patient is not currently on an ACEI or ARB for HF.  This note is being placed in the record in order to provide documentation that a contraindication to the use of these agents is present for this encounter.  ACE Inhibitor or Angiotensin Receptor Blocker is contraindicated (specify all that apply)  [x]   ACEI allergy AND ARB allergy []   Angioedema []   Moderate or severe aortic stenosis []   Hyperkalemia []   Hypotension []   Renal artery stenosis []   Worsening renal function, preexisting renal disease or dysfunction   *Patient on Imdur + Hydralazine in setting of ACEi allergy.    Jay, Haskew 03/05/2016 11:15 AM

## 2016-05-27 ENCOUNTER — Inpatient Hospital Stay (HOSPITAL_COMMUNITY)
Admission: EM | Admit: 2016-05-27 | Discharge: 2016-06-01 | DRG: 291 | Disposition: A | Discharge status: Home / Self Care | Payer: Medicaid Other | Attending: Internal Medicine | Admitting: Internal Medicine

## 2016-05-27 ENCOUNTER — Emergency Department (HOSPITAL_COMMUNITY): Payer: Medicaid Other

## 2016-05-27 ENCOUNTER — Encounter (HOSPITAL_COMMUNITY): Payer: Self-pay | Admitting: *Deleted

## 2016-05-27 DIAGNOSIS — Z888 Allergy status to other drugs, medicaments and biological substances status: Secondary | ICD-10-CM

## 2016-05-27 DIAGNOSIS — N183 Chronic kidney disease, stage 3 unspecified: Secondary | ICD-10-CM | POA: Diagnosis present

## 2016-05-27 DIAGNOSIS — Z765 Malingerer [conscious simulation]: Secondary | ICD-10-CM

## 2016-05-27 DIAGNOSIS — I5042 Chronic combined systolic (congestive) and diastolic (congestive) heart failure: Secondary | ICD-10-CM | POA: Diagnosis present

## 2016-05-27 DIAGNOSIS — Z7982 Long term (current) use of aspirin: Secondary | ICD-10-CM

## 2016-05-27 DIAGNOSIS — Z9111 Patient's noncompliance with dietary regimen: Secondary | ICD-10-CM

## 2016-05-27 DIAGNOSIS — N433 Hydrocele, unspecified: Secondary | ICD-10-CM | POA: Diagnosis present

## 2016-05-27 DIAGNOSIS — N5089 Other specified disorders of the male genital organs: Secondary | ICD-10-CM | POA: Diagnosis present

## 2016-05-27 DIAGNOSIS — I130 Hypertensive heart and chronic kidney disease with heart failure and stage 1 through stage 4 chronic kidney disease, or unspecified chronic kidney disease: Principal | ICD-10-CM | POA: Diagnosis present

## 2016-05-27 DIAGNOSIS — Z6841 Body Mass Index (BMI) 40.0 and over, adult: Secondary | ICD-10-CM

## 2016-05-27 DIAGNOSIS — G4733 Obstructive sleep apnea (adult) (pediatric): Secondary | ICD-10-CM | POA: Diagnosis present

## 2016-05-27 DIAGNOSIS — I10 Essential (primary) hypertension: Secondary | ICD-10-CM | POA: Diagnosis present

## 2016-05-27 DIAGNOSIS — I5043 Acute on chronic combined systolic (congestive) and diastolic (congestive) heart failure: Secondary | ICD-10-CM | POA: Diagnosis present

## 2016-05-27 DIAGNOSIS — I2583 Coronary atherosclerosis due to lipid rich plaque: Secondary | ICD-10-CM | POA: Diagnosis present

## 2016-05-27 DIAGNOSIS — I509 Heart failure, unspecified: Secondary | ICD-10-CM

## 2016-05-27 DIAGNOSIS — Z8249 Family history of ischemic heart disease and other diseases of the circulatory system: Secondary | ICD-10-CM

## 2016-05-27 DIAGNOSIS — Z951 Presence of aortocoronary bypass graft: Secondary | ICD-10-CM

## 2016-05-27 DIAGNOSIS — Z791 Long term (current) use of non-steroidal anti-inflammatories (NSAID): Secondary | ICD-10-CM

## 2016-05-27 DIAGNOSIS — F17210 Nicotine dependence, cigarettes, uncomplicated: Secondary | ICD-10-CM | POA: Diagnosis present

## 2016-05-27 DIAGNOSIS — I252 Old myocardial infarction: Secondary | ICD-10-CM

## 2016-05-27 DIAGNOSIS — E785 Hyperlipidemia, unspecified: Secondary | ICD-10-CM | POA: Diagnosis present

## 2016-05-27 DIAGNOSIS — I2510 Atherosclerotic heart disease of native coronary artery without angina pectoris: Secondary | ICD-10-CM | POA: Diagnosis present

## 2016-05-27 DIAGNOSIS — E669 Obesity, unspecified: Secondary | ICD-10-CM | POA: Diagnosis present

## 2016-05-27 HISTORY — DX: Hypokalemia: E87.6

## 2016-05-27 LAB — BASIC METABOLIC PANEL
Anion gap: 11 (ref 5–15)
BUN: 17 mg/dL (ref 6–20)
CHLORIDE: 102 mmol/L (ref 101–111)
CO2: 23 mmol/L (ref 22–32)
CREATININE: 1.75 mg/dL — AB (ref 0.61–1.24)
Calcium: 8.9 mg/dL (ref 8.9–10.3)
GFR calc Af Amer: 53 mL/min — ABNORMAL LOW (ref >60)
GFR calc non Af Amer: 45 mL/min — ABNORMAL LOW (ref >60)
GLUCOSE: 103 mg/dL — AB (ref 65–99)
Potassium: 3.5 mmol/L (ref 3.5–5.1)
SODIUM: 136 mmol/L (ref 135–145)

## 2016-05-27 LAB — CBC
HCT: 44 % (ref 39.0–52.0)
Hemoglobin: 14.9 g/dL (ref 13.0–17.0)
MCH: 30.9 pg (ref 26.0–34.0)
MCHC: 33.9 g/dL (ref 30.0–36.0)
MCV: 91.3 fL (ref 78.0–100.0)
PLATELETS: 253 10*3/uL (ref 150–400)
RBC: 4.82 MIL/uL (ref 4.22–5.81)
RDW: 15.6 % — AB (ref 11.5–15.5)
WBC: 7.2 10*3/uL (ref 4.0–10.5)

## 2016-05-27 LAB — I-STAT TROPONIN, ED: Troponin i, poc: 0.05 ng/mL (ref 0.00–0.08)

## 2016-05-27 LAB — BRAIN NATRIURETIC PEPTIDE: B NATRIURETIC PEPTIDE 5: 1016.5 pg/mL — AB (ref 0.0–100.0)

## 2016-05-27 MED ORDER — ACETAMINOPHEN 500 MG PO TABS
1000.0000 mg | ORAL_TABLET | Freq: Once | ORAL | Status: AC
  Administered 2016-05-27: 1000 mg via ORAL
  Filled 2016-05-27: qty 2

## 2016-05-27 MED ORDER — HYDRALAZINE HCL 20 MG/ML IJ SOLN
10.0000 mg | Freq: Once | INTRAMUSCULAR | Status: DC
  Filled 2016-05-27: qty 1

## 2016-05-27 MED ORDER — POTASSIUM CHLORIDE CRYS ER 20 MEQ PO TBCR
40.0000 meq | EXTENDED_RELEASE_TABLET | Freq: Once | ORAL | Status: AC
  Administered 2016-05-27: 40 meq via ORAL
  Filled 2016-05-27: qty 2

## 2016-05-27 MED ORDER — FUROSEMIDE 10 MG/ML IJ SOLN
60.0000 mg | Freq: Once | INTRAMUSCULAR | Status: AC
  Administered 2016-05-27: 60 mg via INTRAVENOUS
  Filled 2016-05-27: qty 6

## 2016-05-27 MED ORDER — NITROGLYCERIN 2 % TD OINT
1.0000 [in_us] | TOPICAL_OINTMENT | Freq: Once | TRANSDERMAL | Status: AC
  Administered 2016-05-27: 1 [in_us] via TOPICAL
  Filled 2016-05-27: qty 1

## 2016-05-27 MED ORDER — NITROGLYCERIN 0.4 MG SL SUBL: 0.4000 mg | SUBLINGUAL_TABLET | SUBLINGUAL | Status: DC | PRN

## 2016-05-27 NOTE — ED Triage Notes (Signed)
Patient reports increase in shortness of breath and chest pain x 1 week, patient has doubled up on his lasix, last dose yesterday. States he now has scrotal swelling. Patien with hx of CABG. Patient is short of breath during triage, RR 30.

## 2016-05-27 NOTE — ED Notes (Signed)
Patient transported to X-ray 
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
## 2016-05-28 ENCOUNTER — Encounter (HOSPITAL_COMMUNITY): Payer: Self-pay | Admitting: Family Medicine

## 2016-05-28 ENCOUNTER — Inpatient Hospital Stay (HOSPITAL_COMMUNITY): Payer: Medicaid Other

## 2016-05-28 DIAGNOSIS — E785 Hyperlipidemia, unspecified: Secondary | ICD-10-CM | POA: Diagnosis present

## 2016-05-28 DIAGNOSIS — Z765 Malingerer [conscious simulation]: Secondary | ICD-10-CM | POA: Diagnosis not present

## 2016-05-28 DIAGNOSIS — I2583 Coronary atherosclerosis due to lipid rich plaque: Secondary | ICD-10-CM

## 2016-05-28 DIAGNOSIS — I5042 Chronic combined systolic (congestive) and diastolic (congestive) heart failure: Secondary | ICD-10-CM | POA: Diagnosis present

## 2016-05-28 DIAGNOSIS — Z8249 Family history of ischemic heart disease and other diseases of the circulatory system: Secondary | ICD-10-CM | POA: Diagnosis not present

## 2016-05-28 DIAGNOSIS — Z951 Presence of aortocoronary bypass graft: Secondary | ICD-10-CM | POA: Diagnosis not present

## 2016-05-28 DIAGNOSIS — Z6841 Body Mass Index (BMI) 40.0 and over, adult: Secondary | ICD-10-CM | POA: Diagnosis not present

## 2016-05-28 DIAGNOSIS — I5043 Acute on chronic combined systolic (congestive) and diastolic (congestive) heart failure: Secondary | ICD-10-CM | POA: Diagnosis not present

## 2016-05-28 DIAGNOSIS — G4733 Obstructive sleep apnea (adult) (pediatric): Secondary | ICD-10-CM | POA: Diagnosis present

## 2016-05-28 DIAGNOSIS — Z888 Allergy status to other drugs, medicaments and biological substances status: Secondary | ICD-10-CM | POA: Diagnosis not present

## 2016-05-28 DIAGNOSIS — I2510 Atherosclerotic heart disease of native coronary artery without angina pectoris: Secondary | ICD-10-CM | POA: Diagnosis not present

## 2016-05-28 DIAGNOSIS — I130 Hypertensive heart and chronic kidney disease with heart failure and stage 1 through stage 4 chronic kidney disease, or unspecified chronic kidney disease: Secondary | ICD-10-CM | POA: Diagnosis present

## 2016-05-28 DIAGNOSIS — N183 Chronic kidney disease, stage 3 (moderate): Secondary | ICD-10-CM

## 2016-05-28 DIAGNOSIS — I255 Ischemic cardiomyopathy: Secondary | ICD-10-CM | POA: Diagnosis not present

## 2016-05-28 DIAGNOSIS — Z9111 Patient's noncompliance with dietary regimen: Secondary | ICD-10-CM | POA: Diagnosis not present

## 2016-05-28 DIAGNOSIS — F17210 Nicotine dependence, cigarettes, uncomplicated: Secondary | ICD-10-CM | POA: Diagnosis present

## 2016-05-28 DIAGNOSIS — I10 Essential (primary) hypertension: Secondary | ICD-10-CM

## 2016-05-28 DIAGNOSIS — Z791 Long term (current) use of non-steroidal anti-inflammatories (NSAID): Secondary | ICD-10-CM | POA: Diagnosis not present

## 2016-05-28 DIAGNOSIS — N5089 Other specified disorders of the male genital organs: Secondary | ICD-10-CM | POA: Diagnosis present

## 2016-05-28 DIAGNOSIS — Z7982 Long term (current) use of aspirin: Secondary | ICD-10-CM | POA: Diagnosis not present

## 2016-05-28 DIAGNOSIS — I252 Old myocardial infarction: Secondary | ICD-10-CM | POA: Diagnosis not present

## 2016-05-28 DIAGNOSIS — E669 Obesity, unspecified: Secondary | ICD-10-CM | POA: Diagnosis present

## 2016-05-28 DIAGNOSIS — N433 Hydrocele, unspecified: Secondary | ICD-10-CM | POA: Diagnosis present

## 2016-05-28 LAB — I-STAT TROPONIN, ED: Troponin i, poc: 0.04 ng/mL (ref 0.00–0.08)

## 2016-05-28 LAB — GLUCOSE, CAPILLARY: Glucose-Capillary: 132 mg/dL — ABNORMAL HIGH (ref 65–99)

## 2016-05-28 LAB — RAPID URINE DRUG SCREEN, HOSP PERFORMED
AMPHETAMINES: NOT DETECTED
Barbiturates: NOT DETECTED
Benzodiazepines: NOT DETECTED
COCAINE: NOT DETECTED
OPIATES: NOT DETECTED
Tetrahydrocannabinol: NOT DETECTED

## 2016-05-28 MED ORDER — AMLODIPINE BESYLATE 5 MG PO TABS
10.0000 mg | ORAL_TABLET | Freq: Every day | ORAL | Status: DC
  Administered 2016-05-28 – 2016-06-01 (×5): 10 mg via ORAL
  Filled 2016-05-28 (×5): qty 2

## 2016-05-28 MED ORDER — POTASSIUM CHLORIDE CRYS ER 20 MEQ PO TBCR
40.0000 meq | EXTENDED_RELEASE_TABLET | Freq: Every day | ORAL | Status: DC
  Administered 2016-05-28 – 2016-05-29 (×2): 40 meq via ORAL
  Filled 2016-05-28 (×2): qty 2

## 2016-05-28 MED ORDER — ACETAMINOPHEN 650 MG RE SUPP: 650.0000 mg | Freq: Four times a day (QID) | RECTAL | Status: DC | PRN

## 2016-05-28 MED ORDER — ACETAMINOPHEN 325 MG PO TABS
650.0000 mg | ORAL_TABLET | Freq: Four times a day (QID) | ORAL | Status: DC | PRN
  Administered 2016-05-30 – 2016-05-31 (×2): 650 mg via ORAL
  Filled 2016-05-28 (×2): qty 2

## 2016-05-28 MED ORDER — CLONIDINE HCL ER 0.1 MG PO TB12: 0.1000 mg | ORAL_TABLET | Freq: Two times a day (BID) | ORAL | Status: DC

## 2016-05-28 MED ORDER — TRAMADOL HCL 50 MG PO TABS: 50.0000 mg | ORAL_TABLET | Freq: Four times a day (QID) | ORAL | Status: DC | PRN

## 2016-05-28 MED ORDER — ENOXAPARIN SODIUM 40 MG/0.4ML ~~LOC~~ SOLN
40.0000 mg | Freq: Every day | SUBCUTANEOUS | Status: DC
  Administered 2016-05-28: 40 mg via SUBCUTANEOUS
  Filled 2016-05-28 (×4): qty 0.4

## 2016-05-28 MED ORDER — ISOSORBIDE MONONITRATE ER 30 MG PO TB24
30.0000 mg | ORAL_TABLET | Freq: Every day | ORAL | Status: DC
  Administered 2016-05-28 – 2016-06-01 (×5): 30 mg via ORAL
  Filled 2016-05-28 (×5): qty 1

## 2016-05-28 MED ORDER — FUROSEMIDE 10 MG/ML IJ SOLN
60.0000 mg | Freq: Two times a day (BID) | INTRAMUSCULAR | Status: DC
  Administered 2016-05-28 – 2016-05-29 (×4): 60 mg via INTRAVENOUS
  Filled 2016-05-28 (×4): qty 6

## 2016-05-28 MED ORDER — ATORVASTATIN CALCIUM 40 MG PO TABS
40.0000 mg | ORAL_TABLET | Freq: Every day | ORAL | Status: DC
  Administered 2016-05-28 – 2016-05-31 (×4): 40 mg via ORAL
  Filled 2016-05-28 (×4): qty 1

## 2016-05-28 MED ORDER — HYDRALAZINE HCL 50 MG PO TABS
50.0000 mg | ORAL_TABLET | Freq: Three times a day (TID) | ORAL | Status: DC
  Administered 2016-05-28 – 2016-05-30 (×8): 50 mg via ORAL
  Filled 2016-05-28 (×8): qty 1

## 2016-05-28 MED ORDER — CARVEDILOL 25 MG PO TABS
25.0000 mg | ORAL_TABLET | Freq: Two times a day (BID) | ORAL | Status: DC
  Administered 2016-05-28 – 2016-06-01 (×10): 25 mg via ORAL
  Filled 2016-05-28 (×5): qty 1
  Filled 2016-05-28: qty 2
  Filled 2016-05-28 (×4): qty 1

## 2016-05-28 MED ORDER — SODIUM CHLORIDE 0.9% FLUSH
3.0000 mL | Freq: Two times a day (BID) | INTRAVENOUS | Status: DC
  Administered 2016-05-28 – 2016-06-01 (×10): 3 mL via INTRAVENOUS

## 2016-05-28 MED ORDER — ASPIRIN EC 81 MG PO TBEC
81.0000 mg | DELAYED_RELEASE_TABLET | Freq: Every day | ORAL | Status: DC
  Administered 2016-05-28 – 2016-06-01 (×5): 81 mg via ORAL
  Filled 2016-05-28 (×5): qty 1
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
## 2016-05-28 NOTE — Progress Notes (Signed)
Patient states that we are not giving him enough Lasix as he is not putting out enough.  I reminded patient that he has put out over 3000 ccs of urine just since my shift started and that he has to be patient with his body.  Patient also states he needs something for pain, first he had mentioned pain to nurse.  Patient states his testicles and legs hurt, states he takes Norco at home for his legs.  Notified Dr. Wyline Copas, new orders received.

## 2016-05-28 NOTE — ED Notes (Addendum)
Admitting MD Danford aware of BP-189/132.

## 2016-05-28 NOTE — ED Notes (Signed)
Attempted report x1. 

## 2016-05-28 NOTE — Progress Notes (Signed)
PROGRESS NOTE    Scott Salinas  DTO:[id number] DOB: [birth year] DOA: 05/27/2016 PCP: No PCP Per Patient    Brief Narrative:  46 y.o. male with a past medical history significant for chronic systolic CHF EF 98%, CAD s/p CABG x4, HTN, and CKD III baseline Cr 1.7 who presents with dyspnea for 1 week.  The patient at baseline is dyspneic to walk short distances.  However, in the last 1-2 weeks, he has noticed worsening of his dyspnea to include dyspnea at rest or even from minor exertion.  He has worsening swelling in both legs and swelling in his scrotum.  He has had worsening of his chronic orthopnea.  There is no fever, chills, sputum production.  He reports regular adherence to his home diuretics and BP medicines, says he just didn't take them today.  He tried increasing his home Lasix without improvement, so he came to the ER.  Assessment & Plan:   Principal Problem:   Acute on chronic combined systolic and diastolic CHF (congestive heart failure) (HCC) Active Problems:   Accelerated hypertension   Coronary artery disease due to lipid rich plaque   CKD (chronic kidney disease) stage 3, GFR 30-59 ml/min   S/P CABG x 4   1. Acute on chronic systolic CHF:    -Patient is continued on Furosemide 60 mg IV BID -Strict I/Os, daily weights - Thus far, net neg 3.6L - Wt on admit: 129.9kg - Wt today 125kg - Dry wt: 117.4kg -repeat bmet in AM  2. Malignant HTN:  - Presented hypertensive at admission. -Continue home Imdur, hydralazine, carvedilol, clonidine, and amlodipine  3. Scrotal swelling:  - Suspect secondary to edema, but he endorses low abdominal pain, and a recent change, with his impression that this was unilateral swelling, will rule out hernia with [place]. -Scrotal US reviewed. Findings of large bilatereal hydroceles - Continue diuresis as tolerated  4. Coronary disease secondary prevention:  -Continue statin, aspirin -Continue HTN meds - Stable at present  5. CKD  III:  Creatinine at baseline 1.7 -Cr currently at baseline.  -Repeat BMET in AM  6. Drug seeking behavior -complains of scrotal and LE pain -per RN, pt had declined ultram and is wanting "real" pain medications -NCCSR reviewed. Patient was last prescribed 5 days of narcotics in 10/17, claims to be still taking them currently. Pt had mentioned to RN that he will obtain narcotics on his own if not prescribed here. -Avoid narcotics  7. Likely OSA - Witnessed apnea while sleeping this AM - Will give trial of CPAP when sleeping - Recommend outpatient sleep study  DVT prophylaxis: Lovenox subQ Code Status: Full Family Communication: Pt in room, family not at bedside Disposition Plan: Uncertain at this time  Consultants:     Procedures:     Antimicrobials: Anti-infectives    None       Subjective: Without complaints early this AM  Objective: Vitals:   05/28/16 0217 05/28/16 0640 05/28/16 0951 05/28/16 1202  BP: (!) 174/129 (!) 142/74 130/90 (!) 129/91  Pulse: 70 64 77 74  Resp: 20 19  20   Temp: 97.6 F (36.4 C) 98 F (36.7 C)  97.3 F (36.3 C)  TempSrc: Oral Oral  Oral  SpO2: 100% 94% 93% 94%  Weight: 125 kg (275 lb 9.6 oz)     Height: 5\' 9"  (1.753 m)       Intake/Output Summary (Last 24 hours) at 05/28/16 1534 Last data filed at 05/28/16 1515  Gross per 24 hour  Intake             1080 ml  Output             5400 ml  Net            -4320 ml   Filed Weights   05/27/16 2152 05/28/16 0217  Weight: 130 kg (286 lb 9 oz) 125 kg (275 lb 9.6 oz)    Examination:  General exam: Appears calm and comfortable  Respiratory system: Clear to auscultation. Respiratory effort normal. Cardiovascular system: S1 & S2 heard, RRR Gastrointestinal system: Abdomen is nondistended, soft and nontender. No organomegaly or masses felt. Normal bowel sounds heard. Central nervous system: Alert and oriented. No focal neurological deficits. Extremities: Symmetric 5 x 5  power. Skin: No rashes, lesions Psychiatry: Judgement and insight appear normal. Mood & affect appropriate.   Data Reviewed: I have personally reviewed following labs and imaging studies  CBC:  Recent Labs Lab 05/27/16 2155  WBC 7.2  HGB 14.9  HCT 44.0  MCV 91.3  PLT 130   Basic Metabolic Panel:  Recent Labs Lab 05/27/16 2155  NA 136  K 3.5  CL 102  CO2 23  GLUCOSE 103*  BUN 17  CREATININE 1.75*  CALCIUM 8.9   GFR: Estimated Creatinine Clearance: 69.7 mL/min (by C-G formula based on SCr of 1.75 mg/dL (H)). Liver Function Tests: No results for input(s): AST, ALT, ALKPHOS, BILITOT, PROT, ALBUMIN in the last 168 hours. No results for input(s): LIPASE, AMYLASE in the last 168 hours. No results for input(s): AMMONIA in the last 168 hours. Coagulation Profile: No results for input(s): INR, PROTIME in the last 168 hours. Cardiac Enzymes: No results for input(s): CKTOTAL, CKMB, CKMBINDEX, TROPONINI in the last 168 hours. BNP (last 3 results) No results for input(s): PROBNP in the last 8760 hours. HbA1C: No results for input(s): HGBA1C in the last 72 hours. CBG:  Recent Labs Lab 05/28/16 0517  GLUCAP 132*   Lipid Profile: No results for input(s): CHOL, HDL, LDLCALC, TRIG, CHOLHDL, LDLDIRECT in the last 72 hours. Thyroid Function Tests: No results for input(s): TSH, T4TOTAL, FREET4, T3FREE, THYROIDAB in the last 72 hours. Anemia Panel: No results for input(s): VITAMINB12, FOLATE, FERRITIN, TIBC, IRON, RETICCTPCT in the last 72 hours. Sepsis Labs: No results for input(s): PROCALCITON, LATICACIDVEN in the last 168 hours.  No results found for this or any previous visit (from the past 240 hour(s)).   Radiology Studies: Dg Chest 2 View  Result Date: 05/27/2016 CLINICAL DATA:  Shortness of breath EXAM: CHEST  2 VIEW COMPARISON:  03/02/2016 FINDINGS: Previous sternotomy. Cardiomegaly without overt failure. Tiny pleural effusion. No consolidation. No pneumothorax.  IMPRESSION: Tiny pleural effusions.  Cardiomegaly without overt failure. Electronically Signed   By: Donavan Foil M.D.   On: 05/27/2016 23:10   US Scrotum  Result Date: 05/28/2016 CLINICAL DATA:  Scrotal swelling EXAM: ULTRASOUND OF SCROTUM TECHNIQUE: Complete ultrasound examination of the testicles, epididymis, and other scrotal structures was performed. COMPARISON:  None. FINDINGS: Right testicle Measurements: 4.8 x 3.5 x 3.8 cm. No mass or microlithiasis visualized. Left testicle Measurements: 4.9 x 3.1 x 4.0 cm. No mass or microlithiasis visualized. Right epididymis:  Normal in size and appearance. Left epididymis:  Normal in size and appearance. Hydrocele:  Bilateral large hydroceles identified. Varicocele:  Bilateral varicoceles seen, left greater than right. IMPRESSION: 1. Large bilateral hydroceles. 2. Left greater than right varicoceles. Electronically Signed   By: Dorise Bullion III M.D   On: 05/28/2016  10:57    Scheduled Meds: . amLODipine  10 mg Oral Daily  . aspirin EC  81 mg Oral Daily  . atorvastatin  40 mg Oral q1800  . carvedilol  25 mg Oral BID WC  . enoxaparin (LOVENOX) injection  40 mg Subcutaneous Daily  . furosemide  60 mg Intravenous BID  . hydrALAZINE  50 mg Oral Q8H  . isosorbide mononitrate  30 mg Oral Daily  . potassium chloride  40 mEq Oral Daily  . sodium chloride flush  3 mL Intravenous Q12H   Continuous Infusions:   LOS: 0 days   CHIU, Orpah Melter, MD [facility] Pager [phone]  If 7PM-7AM, please contact night-coverage [email or web address] Password [facility] 05/28/2016, 3:34 PM

## 2016-05-29 LAB — BASIC METABOLIC PANEL
ANION GAP: 8 (ref 5–15)
BUN: 13 mg/dL (ref 6–20)
CHLORIDE: 105 mmol/L (ref 101–111)
CO2: 24 mmol/L (ref 22–32)
Calcium: 8.4 mg/dL — ABNORMAL LOW (ref 8.9–10.3)
Creatinine, Ser: 1.95 mg/dL — ABNORMAL HIGH (ref 0.61–1.24)
GFR calc Af Amer: 46 mL/min — ABNORMAL LOW (ref >60)
GFR calc non Af Amer: 40 mL/min — ABNORMAL LOW (ref >60)
Glucose, Bld: 125 mg/dL — ABNORMAL HIGH (ref 65–99)
POTASSIUM: 3.3 mmol/L — AB (ref 3.5–5.1)
SODIUM: 137 mmol/L (ref 135–145)

## 2016-05-29 MED ORDER — FUROSEMIDE 10 MG/ML IJ SOLN
60.0000 mg | Freq: Two times a day (BID) | INTRAMUSCULAR | Status: DC
Start: 2016-05-30 — End: 2016-05-29

## 2016-05-29 MED ORDER — BISACODYL 5 MG PO TBEC
10.0000 mg | DELAYED_RELEASE_TABLET | Freq: Once | ORAL | Status: AC
  Administered 2016-05-29: 10 mg via ORAL
  Filled 2016-05-29: qty 2

## 2016-05-29 MED ORDER — IPRATROPIUM-ALBUTEROL 0.5-2.5 (3) MG/3ML IN SOLN
3.0000 mL | RESPIRATORY_TRACT | Status: DC | PRN
  Administered 2016-05-29: 3 mL via RESPIRATORY_TRACT
  Filled 2016-05-29: qty 3

## 2016-05-29 MED ORDER — FUROSEMIDE 10 MG/ML IJ SOLN
60.0000 mg | Freq: Two times a day (BID) | INTRAMUSCULAR | Status: DC
  Administered 2016-05-30 – 2016-05-31 (×4): 60 mg via INTRAVENOUS
  Filled 2016-05-29 (×6): qty 6

## 2016-05-29 NOTE — Progress Notes (Signed)
Pt said he wanted respiratory to adjust his C-pap machine. He stated the the C-pap was making him hurt all over. When I offered Ultram for pain he said "no they can keep that sh**. He yelled and told me to get the F out of his room. The respiratory therapist came and adjusted his C-pap.

## 2016-05-29 NOTE — Progress Notes (Signed)
PROGRESS NOTE    Scott Salinas  XIP:[id number] DOB: 09/25/1970 DOA: 05/27/2016 PCP: No PCP Per Patient    Brief Narrative:  46 y.o. male with a past medical history significant for chronic systolic CHF EF 67%, CAD s/p CABG x4, HTN, and CKD III baseline Cr 1.7 who presents with dyspnea for 1 week.  The patient at baseline is dyspneic to walk short distances.  However, in the last 1-2 weeks, he has noticed worsening of his dyspnea to include dyspnea at rest or even from minor exertion.  He has worsening swelling in both legs and swelling in his scrotum.  He has had worsening of his chronic orthopnea.  There is no fever, chills, sputum production.  He reports regular adherence to his home diuretics and BP medicines, says he just didn't take them today.  He tried increasing his home Lasix without improvement, so he came to the ER.  Assessment & Plan:   Principal Problem:   Acute on chronic combined systolic and diastolic CHF (congestive heart failure) (HCC) Active Problems:   Accelerated hypertension   Coronary artery disease due to lipid rich plaque   CKD (chronic kidney disease) stage 3, GFR 30-59 ml/min   S/P CABG x 4   1. Acute on chronic systolic CHF:    -Patient is continued on Furosemide 60 mg IV BID -Strict I/Os, daily weights - Thus far, net neg 5.3L - Wt on admit: 129.9kg - Wt today 122.1kg - Dry wt: 117.4kg -Will repeat bmet in AM  2. Malignant HTN:  - Presented hypertensive at admission. -For now, continue home Imdur, hydralazine, carvedilol, clonidine, and amlodipine -BP stable  3. Scrotal swelling:  - Suspect secondary to edema, but he endorses low abdominal pain, and a recent change, with his impression that this was unilateral swelling, will rule out hernia with [place]. -Scrotal US reviewed. Findings of large bilatereal hydroceles - will continue to diurese as tolerated  4. Coronary disease secondary prevention:  -Continue statin, aspirin -Continue HTN  meds - Remains stable at this time  5. CKD III:  -Creatinine at baseline 1.7 -Cr this AM of 1.95 -Cont to diurese for now. -Recheck bmet in am  6. Drug seeking behavior -complains of scrotal and LE pain -per RN, pt had declined ultram and is wanting "real" pain medications -NCCSR reviewed. Patient was last prescribed 5 days of narcotics in 10/17, claims to be still taking them currently. Pt had mentioned to RN that he will obtain narcotics on his own if not prescribed here. -Would continue to avoid narcotics  7. Likely OSA - Witnessed apnea while sleeping this AM - For now, will continue CPAP when sleeping - Would benefit from outpatient sleep study  DVT prophylaxis: Lovenox subQ Code Status: Full Family Communication: Pt in room, family not at bedside Disposition Plan: Uncertain at this time  Consultants:     Procedures:     Antimicrobials: Anti-infectives    None      Subjective: No complaints today  Objective: Vitals:   05/28/16 2341 05/29/16 0031 05/29/16 0446 05/29/16 1157  BP:   (!) 152/108 130/74  Pulse: 87  77 77  Resp: 19  19 18   Temp:   97.4 F (36.3 C) 98 F (36.7 C)  TempSrc:   Oral Oral  SpO2: 96% 96% 100% 100%  Weight:   122.2 kg (269 lb 6.4 oz)   Height:        Intake/Output Summary (Last 24 hours) at 05/29/16 1418 Last data filed at  05/29/16 1300  Gross per 24 hour  Intake              720 ml  Output             5875 ml  Net            -5155 ml   Filed Weights   05/27/16 2152 05/28/16 0217 05/29/16 0446  Weight: 130 kg (286 lb 9 oz) 125 kg (275 lb 9.6 oz) 122.2 kg (269 lb 6.4 oz)    Examination:  General exam: Laying in bed, in nad Respiratory system: normal resp effort, no audible wheezing Cardiovascular system: regular rate, s1, s2 Gastrointestinal system: soft, nondistended, pos BS Central nervous system: cn2-12 grossly intact, strength intact Extremities: perfused, no clubbing Skin: no pallor, no rashes Psychiatry:  mood normal// no visual hallucinations.   Data Reviewed: I have personally reviewed following labs and imaging studies  CBC:  Recent Labs Lab 05/27/16 2155  WBC 7.2  HGB 14.9  HCT 44.0  MCV 91.3  PLT 782   Basic Metabolic Panel:  Recent Labs Lab 05/27/16 2155 05/29/16 0518  NA 136 137  K 3.5 3.3*  CL 102 105  CO2 23 24  GLUCOSE 103* 125*  BUN 17 13  CREATININE 1.75* 1.95*  CALCIUM 8.9 8.4*   GFR: Estimated Creatinine Clearance: 61.8 mL/min (by C-G formula based on SCr of 1.95 mg/dL (H)). Liver Function Tests: No results for input(s): AST, ALT, ALKPHOS, BILITOT, PROT, ALBUMIN in the last 168 hours. No results for input(s): LIPASE, AMYLASE in the last 168 hours. No results for input(s): AMMONIA in the last 168 hours. Coagulation Profile: No results for input(s): INR, PROTIME in the last 168 hours. Cardiac Enzymes: No results for input(s): CKTOTAL, CKMB, CKMBINDEX, TROPONINI in the last 168 hours. BNP (last 3 results) No results for input(s): PROBNP in the last 8760 hours. HbA1C: No results for input(s): HGBA1C in the last 72 hours. CBG:  Recent Labs Lab 05/28/16 0517  GLUCAP 132*   Lipid Profile: No results for input(s): CHOL, HDL, LDLCALC, TRIG, CHOLHDL, LDLDIRECT in the last 72 hours. Thyroid Function Tests: No results for input(s): TSH, T4TOTAL, FREET4, T3FREE, THYROIDAB in the last 72 hours. Anemia Panel: No results for input(s): VITAMINB12, FOLATE, FERRITIN, TIBC, IRON, RETICCTPCT in the last 72 hours. Sepsis Labs: No results for input(s): PROCALCITON, LATICACIDVEN in the last 168 hours.  No results found for this or any previous visit (from the past 240 hour(s)).   Radiology Studies: Dg Chest 2 View  Result Date: 05/27/2016 CLINICAL DATA:  Shortness of breath EXAM: CHEST  2 VIEW COMPARISON:  03/02/2016 FINDINGS: Previous sternotomy. Cardiomegaly without overt failure. Tiny pleural effusion. No consolidation. No pneumothorax. IMPRESSION: Tiny  pleural effusions.  Cardiomegaly without overt failure. Electronically Signed   By: Donavan Foil M.D.   On: 05/27/2016 23:10   US Scrotum  Result Date: 05/28/2016 CLINICAL DATA:  Scrotal swelling EXAM: ULTRASOUND OF SCROTUM TECHNIQUE: Complete ultrasound examination of the testicles, epididymis, and other scrotal structures was performed. COMPARISON:  None. FINDINGS: Right testicle Measurements: 4.8 x 3.5 x 3.8 cm. No mass or microlithiasis visualized. Left testicle Measurements: 4.9 x 3.1 x 4.0 cm. No mass or microlithiasis visualized. Right epididymis:  Normal in size and appearance. Left epididymis:  Normal in size and appearance. Hydrocele:  Bilateral large hydroceles identified. Varicocele:  Bilateral varicoceles seen, left greater than right. IMPRESSION: 1. Large bilateral hydroceles. 2. Left greater than right varicoceles. Electronically Signed   By: Shanon Brow  Jimmye Norman III M.D   On: 05/28/2016 10:57    Scheduled Meds: . amLODipine  10 mg Oral Daily  . aspirin EC  81 mg Oral Daily  . atorvastatin  40 mg Oral q1800  . carvedilol  25 mg Oral BID WC  . enoxaparin (LOVENOX) injection  40 mg Subcutaneous Daily  . furosemide  60 mg Intravenous BID  . hydrALAZINE  50 mg Oral Q8H  . isosorbide mononitrate  30 mg Oral Daily  . potassium chloride  40 mEq Oral Daily  . sodium chloride flush  3 mL Intravenous Q12H   Continuous Infusions:   LOS: 1 day   Keith Cancio, Orpah Melter, MD [facility] Pager [phone]  If 7PM-7AM, please contact night-coverage [email or web address] Password [email or web address] 05/29/2016, 2:18 PM

## 2016-05-30 LAB — BASIC METABOLIC PANEL
Anion gap: 9 (ref 5–15)
BUN: 16 mg/dL (ref 6–20)
CHLORIDE: 102 mmol/L (ref 101–111)
CO2: 25 mmol/L (ref 22–32)
Calcium: 8.6 mg/dL — ABNORMAL LOW (ref 8.9–10.3)
Creatinine, Ser: 1.81 mg/dL — ABNORMAL HIGH (ref 0.61–1.24)
GFR calc non Af Amer: 44 mL/min — ABNORMAL LOW (ref >60)
GFR, EST AFRICAN AMERICAN: 50 mL/min — AB (ref >60)
Glucose, Bld: 150 mg/dL — ABNORMAL HIGH (ref 65–99)
POTASSIUM: 3 mmol/L — AB (ref 3.5–5.1)
SODIUM: 136 mmol/L (ref 135–145)

## 2016-05-30 LAB — MAGNESIUM: MAGNESIUM: 2.2 mg/dL (ref 1.7–2.4)

## 2016-05-30 MED ORDER — HYDRALAZINE HCL 50 MG PO TABS
75.0000 mg | ORAL_TABLET | Freq: Three times a day (TID) | ORAL | Status: DC
  Administered 2016-05-30 – 2016-06-01 (×6): 75 mg via ORAL
  Filled 2016-05-30 (×6): qty 1

## 2016-05-30 MED ORDER — POTASSIUM CHLORIDE CRYS ER 20 MEQ PO TBCR
40.0000 meq | EXTENDED_RELEASE_TABLET | Freq: Every day | ORAL | Status: DC
  Administered 2016-05-31 – 2016-06-01 (×2): 40 meq via ORAL
  Filled 2016-05-30 (×3): qty 2

## 2016-05-30 MED ORDER — POTASSIUM CHLORIDE CRYS ER 20 MEQ PO TBCR
40.0000 meq | EXTENDED_RELEASE_TABLET | Freq: Two times a day (BID) | ORAL | Status: AC
  Administered 2016-05-30 (×2): 40 meq via ORAL
  Filled 2016-05-30 (×2): qty 2

## 2016-05-30 NOTE — Progress Notes (Signed)
PROGRESS NOTE    Scott Salinas  RXV:[id number] DOB: 09/23/1970 DOA: 05/27/2016 PCP: No PCP Per Patient    Brief Narrative:  46 y.o. male with a past medical history significant for chronic systolic CHF EF 50%, CAD s/p CABG x4, HTN, and CKD III baseline Cr 1.7 who presents with dyspnea for 1 week.  The patient at baseline is dyspneic to walk short distances.  However, in the last 1-2 weeks, he has noticed worsening of his dyspnea to include dyspnea at rest or even from minor exertion.  He has worsening swelling in both legs and swelling in his scrotum.  He has had worsening of his chronic orthopnea.  There is no fever, chills, sputum production.  He reports regular adherence to his home diuretics and BP medicines, says he just didn't take them today.  He tried increasing his home Lasix without improvement, so he came to the ER.  Assessment & Plan:   Principal Problem:   Acute on chronic combined systolic and diastolic CHF (congestive heart failure) (HCC) Active Problems:   Accelerated hypertension   Coronary artery disease due to lipid rich plaque   CKD (chronic kidney disease) stage 3, GFR 30-59 ml/min   S/P CABG x 4   1. Acute on chronic systolic CHF:    -Patient is continued on Furosemide 60 mg IV BID -Strict I/Os, daily weights - Thus far, net neg 11 L - Wt on admit: 129.9kg - Wt today 119.1kg - Dry wt: 117.4kg from previous admit, however anticipate lower new dry wt this admit given continued LE edema -recheck bmet in AM  2. Malignant HTN:  - Presented hypertensive at admission. -For now, continue home Imdur, hydralazine, carvedilol, clonidine, and amlodipine -BP remained poorly controlled -Have increased dose of hydralazine  3. Scrotal swelling:  - Suspect secondary to edema, but he endorses low abdominal pain, and a recent change, with his impression that this was unilateral swelling, will rule out hernia with [place]. -Scrotal US reviewed. Findings of large bilatereal  hydroceles - for now, will continue to diurese as tolerated  4. Coronary disease secondary prevention:  -Continue statin, aspirin -Continue HTN meds - currently stable at this time  5. CKD III:  -Creatinine at baseline 1.7 -Cr this AM of 1.81 -Will continue to diurese for now. -Recheck bmet in am  6. Drug seeking behavior -complains of scrotal and LE pain -per RN, pt had declined ultram and is wanting "real" pain medications -NCCSR reviewed. Patient was last prescribed 5 days of narcotics in 10/17, claims to be still taking them currently. Pt had mentioned to RN that he will obtain narcotics on his own (in [place]) if not prescribed here. -Presenting UDS was neg for opiates -Would continue to avoid narcotics  7. Likely OSA - Witnessed apnea while sleeping - For now, will continue CPAP when sleeping - Would greatly benefit from outpatient sleep study  DVT prophylaxis: Lovenox subQ Code Status: Full Family Communication: Pt in room, family not at bedside Disposition Plan: Possible d/c home in 48-72hrs  Consultants:     Procedures:     Antimicrobials: Anti-infectives    None      Subjective: Reports feeling better today  Objective: Vitals:   05/30/16 0605 05/30/16 0845 05/30/16 0850 05/30/16 1142  BP: (!) 162/107 (!) 151/124 (!) 136/115 118/69  Pulse: 78   (!) 48  Resp: 18   18  Temp: 97.2 F (36.2 C)   97.3 F (36.3 C)  TempSrc: Oral   Oral  SpO2: 100%   100%  Weight: 119.6 kg (263 lb 11.2 oz)     Height:        Intake/Output Summary (Last 24 hours) at 05/30/16 1655 Last data filed at 05/30/16 1500  Gross per 24 hour  Intake             1360 ml  Output             4480 ml  Net            -3120 ml   Filed Weights   05/28/16 0217 05/29/16 0446 05/30/16 0605  Weight: 125 kg (275 lb 9.6 oz) 122.2 kg (269 lb 6.4 oz) 119.6 kg (263 lb 11.2 oz)    Examination:  General exam: Sitting in chair, in nad Respiratory system: normal chest rise, no  audible wheezing Cardiovascular system: regular rhythm, s1-2 Gastrointestinal system: obese, soft, nondistended Central nervous system: No seizures, no tremors Extremities: no cyanosis, no joint deformities Skin: normal skin turgor, no rashes Psychiatry: affect normal// no auditory hallucinations.   Data Reviewed: I have personally reviewed following labs and imaging studies  CBC:  Recent Labs Lab 05/27/16 2155  WBC 7.2  HGB 14.9  HCT 44.0  MCV 91.3  PLT 749   Basic Metabolic Panel:  Recent Labs Lab 05/27/16 2155 05/29/16 0518 05/30/16 0447  NA 136 137 136  K 3.5 3.3* 3.0*  CL 102 105 102  CO2 23 24 25   GLUCOSE 103* 125* 150*  BUN 17 13 16   CREATININE 1.75* 1.95* 1.81*  CALCIUM 8.9 8.4* 8.6*  MG  --   --  2.2   GFR: Estimated Creatinine Clearance: 65.8 mL/min (by C-G formula based on SCr of 1.81 mg/dL (H)). Liver Function Tests: No results for input(s): AST, ALT, ALKPHOS, BILITOT, PROT, ALBUMIN in the last 168 hours. No results for input(s): LIPASE, AMYLASE in the last 168 hours. No results for input(s): AMMONIA in the last 168 hours. Coagulation Profile: No results for input(s): INR, PROTIME in the last 168 hours. Cardiac Enzymes: No results for input(s): CKTOTAL, CKMB, CKMBINDEX, TROPONINI in the last 168 hours. BNP (last 3 results) No results for input(s): PROBNP in the last 8760 hours. HbA1C: No results for input(s): HGBA1C in the last 72 hours. CBG:  Recent Labs Lab 05/28/16 0517  GLUCAP 132*   Lipid Profile: No results for input(s): CHOL, HDL, LDLCALC, TRIG, CHOLHDL, LDLDIRECT in the last 72 hours. Thyroid Function Tests: No results for input(s): TSH, T4TOTAL, FREET4, T3FREE, THYROIDAB in the last 72 hours. Anemia Panel: No results for input(s): VITAMINB12, FOLATE, FERRITIN, TIBC, IRON, RETICCTPCT in the last 72 hours. Sepsis Labs: No results for input(s): PROCALCITON, LATICACIDVEN in the last 168 hours.  No results found for this or any  previous visit (from the past 240 hour(s)).   Radiology Studies: No results found.  Scheduled Meds: . amLODipine  10 mg Oral Daily  . aspirin EC  81 mg Oral Daily  . atorvastatin  40 mg Oral q1800  . carvedilol  25 mg Oral BID WC  . enoxaparin (LOVENOX) injection  40 mg Subcutaneous Daily  . furosemide  60 mg Intravenous BID  . hydrALAZINE  75 mg Oral Q8H  . isosorbide mononitrate  30 mg Oral Daily  . potassium chloride  40 mEq Oral BID  . [START ON 05/31/2016] potassium chloride  40 mEq Oral Daily  . sodium chloride flush  3 mL Intravenous Q12H   Continuous Infusions:   LOS: 2 days  CHIU, Orpah Melter, MD [facility] Pager [phone]  If 7PM-7AM, please contact night-coverage [email or web address] Password [email or web address] 05/30/2016, 4:55 PM

## 2016-05-30 NOTE — Progress Notes (Signed)
Pharmacist Heart Failure Core Measure Documentation  Assessment: Scott Salinas has an EF documented as 35-40% on 12/31/15 by Mikki Santee.  Rationale: Heart failure patients with left ventricular systolic dysfunction (LVSD) and an EF < 40% should be prescribed an angiotensin converting enzyme inhibitor (ACEI) or angiotensin receptor blocker (ARB) at discharge unless a contraindication is documented in the medical record.  This patient is not currently on an ACEI or ARB for HF.  This note is being placed in the record in order to provide documentation that a contraindication to the use of these agents is present for this encounter.  ACE Inhibitor or Angiotensin Receptor Blocker is contraindicated (specify all that apply)  []   ACEI allergy AND ARB allergy [x]   Angioedema []   Moderate or severe aortic stenosis []   Hyperkalemia []   Hypotension []   Renal artery stenosis []   Worsening renal function, preexisting renal disease or dysfunction   [name] 05/30/2016 12:22 PM

## 2016-05-31 ENCOUNTER — Ambulatory Visit (HOSPITAL_COMMUNITY): Payer: Medicaid Other

## 2016-05-31 DIAGNOSIS — I255 Ischemic cardiomyopathy: Secondary | ICD-10-CM

## 2016-05-31 DIAGNOSIS — I5043 Acute on chronic combined systolic (congestive) and diastolic (congestive) heart failure: Secondary | ICD-10-CM

## 2016-05-31 LAB — BASIC METABOLIC PANEL
Anion gap: 9 (ref 5–15)
BUN: 16 mg/dL (ref 6–20)
CHLORIDE: 104 mmol/L (ref 101–111)
CO2: 29 mmol/L (ref 22–32)
Calcium: 8.9 mg/dL (ref 8.9–10.3)
Creatinine, Ser: 1.92 mg/dL — ABNORMAL HIGH (ref 0.61–1.24)
GFR calc Af Amer: 47 mL/min — ABNORMAL LOW (ref >60)
GFR calc non Af Amer: 41 mL/min — ABNORMAL LOW (ref >60)
Glucose, Bld: 100 mg/dL — ABNORMAL HIGH (ref 65–99)
POTASSIUM: 3.7 mmol/L (ref 3.5–5.1)
Sodium: 142 mmol/L (ref 135–145)

## 2016-05-31 LAB — ECHOCARDIOGRAM COMPLETE
Height: 69 in
WEIGHTICAEL: 4129.6 [oz_av]

## 2016-05-31 MED ORDER — POTASSIUM CHLORIDE CRYS ER 20 MEQ PO TBCR
20.0000 meq | EXTENDED_RELEASE_TABLET | Freq: Once | ORAL | Status: AC
  Administered 2016-05-31: 20 meq via ORAL
  Filled 2016-05-31: qty 1

## 2016-05-31 MED ORDER — POLYETHYLENE GLYCOL 3350 17 G PO PACK: 17.0000 g | PACK | Freq: Every day | ORAL | Status: DC | PRN

## 2016-05-31 MED ORDER — PNEUMOCOCCAL VAC POLYVALENT 25 MCG/0.5ML IJ INJ
0.5000 mL | INJECTION | INTRAMUSCULAR | Status: DC
  Filled 2016-05-31: qty 0.5

## 2016-05-31 MED ORDER — DOCUSATE SODIUM 100 MG PO CAPS
100.0000 mg | ORAL_CAPSULE | Freq: Two times a day (BID) | ORAL | Status: DC | PRN
  Administered 2016-05-31: 100 mg via ORAL
  Filled 2016-05-31: qty 1

## 2016-05-31 NOTE — Progress Notes (Signed)
PROGRESS NOTE    Scott Salinas  HAL:[id number] DOB: [birth year] DOA: 05/27/2016 PCP: No PCP Per Patient    Brief Narrative:  46 y.o. male with a past medical history significant for chronic systolic CHF EF 73%, CAD s/p CABG x4, HTN, and CKD III baseline Cr 1.7 who presents with dyspnea for 1 week.  The patient at baseline is dyspneic to walk short distances.  However, in the last 1-2 weeks, he has noticed worsening of his dyspnea to include dyspnea at rest or even from minor exertion.  He has worsening swelling in both legs and swelling in his scrotum.  He has had worsening of his chronic orthopnea.  There is no fever, chills, sputum production.  He reports regular adherence to his home diuretics and BP medicines, says he just didn't take them today.  He tried increasing his home Lasix without improvement, so he came to the ER.  Assessment & Plan:   Principal Problem:   Acute on chronic combined systolic and diastolic CHF (congestive heart failure) (HCC) Active Problems:   Accelerated hypertension   Coronary artery disease due to lipid rich plaque   CKD (chronic kidney disease) stage 3, GFR 30-59 ml/min   S/P CABG x 4   1. Acute on chronic systolic CHF:    -Patient is continued on Furosemide 60 mg IV BID. -Strict I/Os, daily weights - Thus far, net neg 15 L monitor lytes daily -will need repeat echo, ordered    2. Malignant HTN: improed  - Presented hypertensive at admission. -For now, continue home Imdur, hydralazine, carvedilol, and amlodipine -BP remained poorly controlled overall -patient reports of dietary non compliance.   3. Scrotal swelling:  - Suspect secondary to edema, but he endorses low abdominal pain, and a recent change, with his impression that this was unilateral swelling, will rule out hernia with [place]. -Scrotal US reviewed. Findings of large bilatereal hydroceles - for now, will continue to diurese as tolerated  4. Coronary disease secondary  prevention:  -Continue statin, aspirin -Continue HTN meds - currently stable at this time  5. CKD III:  -Creatinine at baseline 1.7 -Will continue to diurese for now. -Recheck bmet in am  6. Drug seeking behavior -complains of scrotal and LE pain -per RN, pt had declined ultram and is wanting "real" pain medications -NCCSR reviewed. Patient was last prescribed 5 days of narcotics in 10/17, claims to be still taking them currently. Pt had mentioned to RN that he will obtain narcotics on his own (in [place]) if not prescribed here. -Presenting UDS was neg for opiates -Would continue to avoid narcotics  7. Likely OSA - Witnessed apnea while sleeping - For now, will continue CPAP when sleeping - Would greatly benefit from outpatient sleep study  Had extensive conversation educating him regarding his cardiac disease and importance of proper follow up and dietary compliance. He continues to show drug seeking behavior but apparently his UDS has been neg in the past.   DVT prophylaxis: Lovenox subQ Code Status: Full Family Communication: Patient can comprehend well.  Disposition Plan: Possible d/c home in next day or two.   Consultants:   None  Procedures:   None  Antimicrobials: Anti-infectives    None      Subjective: Reports feeling better today. He doesn't have any complaints.    Objective: Vitals:   05/30/16 2300 05/30/16 2345 05/31/16 0542 05/31/16 0732  BP: (!) 145/95  (!) 146/104 (!) 128/96  Pulse: 70 68 72 81  Resp: 18  18 18 20   Temp: 97.9 F (36.6 C)  97.3 F (36.3 C) 98 F (36.7 C)  TempSrc: Oral  Oral Oral  SpO2: 99% 98% 100% 98%  Weight:   117.1 kg (258 lb 1.6 oz)   Height:        Intake/Output Summary (Last 24 hours) at 05/31/16 1137 Last data filed at 05/31/16 1040  Gross per 24 hour  Intake             1280 ml  Output             4711 ml  Net            -3431 ml   Filed Weights   05/29/16 0446 05/30/16 0605 05/31/16 0542  Weight:  122.2 kg (269 lb 6.4 oz) 119.6 kg (263 lb 11.2 oz) 117.1 kg (258 lb 1.6 oz)    Examination:  General exam: Sitting in chair, in nad Respiratory system: normal chest rise, no audible wheezing Cardiovascular system: regular rhythm, s1-2 Gastrointestinal system: obese, soft, nondistended Central nervous system: No seizures, no tremors Extremities: no cyanosis, no joint deformities Skin: normal skin turgor, no rashes Psychiatry: affect normal// no auditory hallucinations.   Data Reviewed: I have personally reviewed following labs and imaging studies  CBC:  Recent Labs Lab 05/27/16 2155  WBC 7.2  HGB 14.9  HCT 44.0  MCV 91.3  PLT 973   Basic Metabolic Panel:  Recent Labs Lab 05/27/16 2155 05/29/16 0518 05/30/16 0447 05/31/16 0536  NA 136 137 136 142  K 3.5 3.3* 3.0* 3.7  CL 102 105 102 104  CO2 23 24 25 29   GLUCOSE 103* 125* 150* 100*  BUN 17 13 16 16   CREATININE 1.75* 1.95* 1.81* 1.92*  CALCIUM 8.9 8.4* 8.6* 8.9  MG  --   --  2.2  --    GFR: Estimated Creatinine Clearance: 61.4 mL/min (by C-G formula based on SCr of 1.92 mg/dL (H)). Liver Function Tests: No results for input(s): AST, ALT, ALKPHOS, BILITOT, PROT, ALBUMIN in the last 168 hours. No results for input(s): LIPASE, AMYLASE in the last 168 hours. No results for input(s): AMMONIA in the last 168 hours. Coagulation Profile: No results for input(s): INR, PROTIME in the last 168 hours. Cardiac Enzymes: No results for input(s): CKTOTAL, CKMB, CKMBINDEX, TROPONINI in the last 168 hours. BNP (last 3 results) No results for input(s): PROBNP in the last 8760 hours. HbA1C: No results for input(s): HGBA1C in the last 72 hours. CBG:  Recent Labs Lab 05/28/16 0517  GLUCAP 132*   Lipid Profile: No results for input(s): CHOL, HDL, LDLCALC, TRIG, CHOLHDL, LDLDIRECT in the last 72 hours. Thyroid Function Tests: No results for input(s): TSH, T4TOTAL, FREET4, T3FREE, THYROIDAB in the last 72 hours. Anemia  Panel: No results for input(s): VITAMINB12, FOLATE, FERRITIN, TIBC, IRON, RETICCTPCT in the last 72 hours. Sepsis Labs: No results for input(s): PROCALCITON, LATICACIDVEN in the last 168 hours.  No results found for this or any previous visit (from the past 240 hour(s)).   Radiology Studies: No results found.  Scheduled Meds: . amLODipine  10 mg Oral Daily  . aspirin EC  81 mg Oral Daily  . atorvastatin  40 mg Oral q1800  . carvedilol  25 mg Oral BID WC  . enoxaparin (LOVENOX) injection  40 mg Subcutaneous Daily  . furosemide  60 mg Intravenous BID  . hydrALAZINE  75 mg Oral Q8H  . isosorbide mononitrate  30 mg Oral Daily  . [START  ON 06/01/2016] pneumococcal 23 valent vaccine  0.5 mL Intramuscular Tomorrow-1000  . potassium chloride  40 mEq Oral Daily  . sodium chloride flush  3 mL Intravenous Q12H   Continuous Infusions:   LOS: 3 days   Ankit Arsenio Loader, MD [facility] Pager [phone]  If 7PM-7AM, please contact night-coverage [email or web address] Password [facility] 05/31/2016, 11:37 AM

## 2016-05-31 NOTE — Progress Notes (Signed)
RT came to place patient on CPAP. Patient is able to put mask on his self. He does not require any assistance.

## 2016-05-31 NOTE — Progress Notes (Signed)
  Echocardiogram 2D Echocardiogram has been performed.  Diamond Nickel 05/31/2016, 3:54 PM

## 2016-05-31 NOTE — Progress Notes (Signed)
Pt placing cpap mask on own. No assistance needed.

## 2016-06-01 LAB — BASIC METABOLIC PANEL
Anion gap: 9 (ref 5–15)
BUN: 17 mg/dL (ref 6–20)
CALCIUM: 9 mg/dL (ref 8.9–10.3)
CO2: 28 mmol/L (ref 22–32)
CREATININE: 1.88 mg/dL — AB (ref 0.61–1.24)
Chloride: 102 mmol/L (ref 101–111)
GFR calc non Af Amer: 42 mL/min — ABNORMAL LOW (ref >60)
GFR, EST AFRICAN AMERICAN: 48 mL/min — AB (ref >60)
GLUCOSE: 114 mg/dL — AB (ref 65–99)
Potassium: 3.6 mmol/L (ref 3.5–5.1)
Sodium: 139 mmol/L (ref 135–145)

## 2016-06-01 LAB — MAGNESIUM: Magnesium: 2.3 mg/dL (ref 1.7–2.4)

## 2016-06-01 MED ORDER — FUROSEMIDE 20 MG PO TABS: 60.0000 mg | ORAL_TABLET | Freq: Every day | ORAL | 0 refills | Status: DC

## 2016-06-01 MED ORDER — FUROSEMIDE 40 MG PO TABS
60.0000 mg | ORAL_TABLET | Freq: Every day | ORAL | Status: DC
  Administered 2016-06-01: 60 mg via ORAL
  Filled 2016-06-01: qty 1

## 2016-06-01 MED ORDER — FUROSEMIDE 40 MG PO TABS: 60.0000 mg | ORAL_TABLET | Freq: Every day | ORAL | Status: DC

## 2016-06-01 MED ORDER — FUROSEMIDE 8 MG/ML PO SOLN
60.0000 mg | Freq: Every day | ORAL | Status: DC
  Filled 2016-06-01: qty 10

## 2016-06-01 MED ORDER — AMLODIPINE BESYLATE 10 MG PO TABS: 10.0000 mg | ORAL_TABLET | Freq: Every day | ORAL | 0 refills | Status: DC

## 2016-06-01 MED ORDER — ASPIRIN 81 MG PO TBEC: 81.0000 mg | DELAYED_RELEASE_TABLET | Freq: Every day | ORAL | 0 refills | Status: DC

## 2016-06-01 NOTE — Progress Notes (Signed)
Pt discharge education went over at bedside with patient. IV discontinued by nursing student, catheter intact. Telemetry box removed. Pt has all belongings, and discharge paperwork. Pt discharged via wheelchair with volunteer services,    Evangelyne Loja Leory Plowman

## 2016-06-01 NOTE — Discharge Summary (Signed)
Physician Discharge Summary  Scott Salinas WSF:[id number] DOB: 07-Aug-1970 DOA: 05/27/2016  PCP: No PCP Per Patient  Admit date: 05/27/2016 Discharge date: 06/01/2016  Admitted From: Home Disposition:  Home  Recommendations for Outpatient Follow-up:  1. Follow up appointments as listed below in the appointment section.    Home Health: No Equipment/Devices: None  Discharge Condition: Stable CODE STATUS: Full  Diet recommendation: Heart Healthy  Brief/Interim Summary: 46 y.o.malewith a past medical history significant for chronic systolic CHF EF 01%, CAD s/p CABG x4, HTN, and CKD III baseline Cr 1.7who presents with dyspnea for 1 week.  The patient at baseline is dyspneic to walk short distances. However, in the last 1-2 weeks, he has noticed worsening of his dyspnea to include dyspnea at rest or even from minor exertion. He has worsening swelling in both legs and swelling in his scrotum. He has had worsening of his chronic orthopnea. There is no fever, chills, sputum production. He reports regular adherence to his home diuretics and BP medicines, says he just didn't take them today. He tried increasing his home Lasix without improvement, so he came to the Er.  After being admitted he was aggressively diuresed which improved his symptoms. In total about 17L was fluid was taken out. During his stay he did continue to show some evidence of narcotic seeking behaviors and at times refused his CPAP at night. UdS was negative. He is not on ACEI due to severe allergy.  We also discussed regarding the importance of follow up with his cardiologist and PCP as outpatient to avoid frequent exac for  CHF. Repeat echo prior to discharged showed slightly improved ef of 45% otherwise unchanged. Currently he is stable to be discharged for outpatient follow up.  Appointments has been made.   Discharge Diagnoses:  Principal Problem:   Acute on chronic combined systolic and diastolic CHF  (congestive heart failure) (HCC) Active Problems:   Accelerated hypertension   Coronary artery disease due to lipid rich plaque   CKD (chronic kidney disease) stage 3, GFR 30-59 ml/min   S/P CABG x 4  1. Acute on chronic systolic VCB:[id number]  "-lasix changed to po -Strict I/Os, daily weights - Thus far, net neg 17 L -monitor lytes daily -repeat echo improved ef now- ~40-45% otherwise unchanged.     2. Malignant MBW:[id number]  - Presented hypertensive at admission. -For now, continue home Imdur, hydralazine, carvedilol, and amlodipine -BP remained poorly controlled overall -patient reports of dietary non compliance.   3. Scrotal swelling: - Suspect secondary to edema, but he endorses low abdominal pain, and a recent change, with his impression that this was unilateral swelling, will rule out hernia with [place]. -Scrotal US reviewed. Findings of large bilatereal hydroceles - for now, will continue to diurese as tolerated  4. Coronary disease secondary prevention: -Continue statin, aspirin -Continue HTN meds - currently stable at this time  5. CKD III: -Creatinine at baseline now  -cont to avoid nephrotoxic drugs.   6. Drug seeking behavior -complains of scrotal and LE pain -per RN, pt had declined ultram and is wanting "real" pain medications -NCCSR reviewed. Patient was last prescribed 5 days of narcotics in 10/17, claims to be still taking them currently. Pt had mentioned to RN that he will obtain narcotics on his own (in [place]) if not prescribed here. -Presenting UDS was neg for opiates -Would continue to avoid narcotics. He needs to closely follow up with his PCP and comply with the clinic rule to get routine narcotics  if needed given his extensive hx of MSK surgeries and injuries.   7. Likely OSA - Witnessed apnea while sleeping - For now, will continue CPAP when sleeping. But continues to refuse it overnight.  - Would greatly benefit from outpatient  sleep study  Discharge Instructions   Allergies as of 06/01/2016      Reactions   Lisinopril Anaphylaxis   Whole right side of face swelled.       Medication List    TAKE these medications   acetaminophen 325 MG tablet Commonly known as:  TYLENOL Take 1 tablet (325 mg total) by mouth every 4 (four) hours as needed for mild pain, moderate pain or headache. What changed:  Another medication with the same name was removed. Continue taking this medication, and follow the directions you see here.   amLODipine 10 MG tablet Commonly known as:  NORVASC Take 1 tablet (10 mg total) by mouth daily.   aspirin 81 MG EC tablet Take 1 tablet (81 mg total) by mouth daily.   atorvastatin 40 MG tablet Commonly known as:  LIPITOR Take 1 tablet (40 mg total) by mouth daily at 6 PM.   carvedilol 25 MG tablet Commonly known as:  COREG Take 1 tablet (25 mg total) by mouth 2 (two) times daily with a meal.   furosemide 20 MG tablet Commonly known as:  LASIX Take 3 tablets (60 mg total) by mouth daily. What changed:  medication strength  how much to take   hydrALAZINE 50 MG tablet Commonly known as:  APRESOLINE Take 1 tablet (50 mg total) by mouth every 8 (eight) hours.   HYDROcodone-acetaminophen 10-325 MG tablet Commonly known as:  NORCO Take 1 tablet by mouth every 6 (six) hours as needed for moderate pain.   isosorbide mononitrate 30 MG 24 hr tablet Commonly known as:  IMDUR Take 1 tablet (30 mg total) by mouth daily.   nitroGLYCERIN 0.4 MG SL tablet Commonly known as:  NITROSTAT Place 1 tablet (0.4 mg total) under the tongue every 5 (five) minutes as needed for chest pain.   potassium chloride SA 20 MEQ tablet Commonly known as:  K-DUR,KLOR-CON Take 1 tablet (20 mEq total) by mouth 2 (two) times daily. Take with your Lasix. What changed:  how much to take  when to take this  additional instructions      Follow-up Information    [facility] Follow up on 06/05/2016.   Why:  call for appointment Contact information: [place] [address] [phone]       Ena Dawley, MD Follow up on 06/12/2016.   Specialty:  Cardiology Why:  @ 12:45pm with Ermalinda Barrios Contact information: [address] [place] [address] [phone]          Allergies  Allergen Reactions  . Lisinopril Anaphylaxis    Whole right side of face swelled.     Consultations:  None   Procedures/Studies: Dg Chest 2 View  Result Date: 05/27/2016 CLINICAL DATA:  Shortness of breath EXAM: CHEST  2 VIEW COMPARISON:  03/02/2016 FINDINGS: Previous sternotomy. Cardiomegaly without overt failure. Tiny pleural effusion. No consolidation. No pneumothorax. IMPRESSION: Tiny pleural effusions.  Cardiomegaly without overt failure. Electronically Signed   By: Donavan Foil M.D.   On: 05/27/2016 23:10   US Scrotum  Result Date: 05/28/2016 CLINICAL DATA:  Scrotal swelling EXAM: ULTRASOUND OF SCROTUM TECHNIQUE: Complete ultrasound examination of the testicles, epididymis, and other scrotal structures  was performed. COMPARISON:  None. FINDINGS: Right testicle Measurements: 4.8 x 3.5 x 3.8 cm. No mass or microlithiasis visualized. Left testicle Measurements: 4.9 x 3.1 x 4.0 cm. No mass or microlithiasis visualized. Right epididymis:  Normal in size and appearance. Left epididymis:  Normal in size and appearance. Hydrocele:  Bilateral large hydroceles identified. Varicocele:  Bilateral varicoceles seen, left greater than right. IMPRESSION: 1. Large bilateral hydroceles. 2. Left greater than right varicoceles. Electronically Signed   By: Dorise Bullion III M.D   On: 05/28/2016 10:57     2d Echo 05/31/16: Impressions:  - Compared to a prior study in 12/2015, the LVEF is slightly higher   at 40-45% with the previously mentioned inferior wall motion   abnormality. LV filling pressure is elevated. 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 There is severe biatrial enlargment.  Subjective:   Discharge Exam: Vitals:   05/31/16 2141 06/01/16 0603  BP: 129/81 (!) 137/113  Pulse: 80 82  Resp: 20 20  Temp: 97.8 F (36.6 C) 97.9 F (36.6 C)   Vitals:   05/31/16 1614 05/31/16 1618 05/31/16 2141 06/01/16 0603  BP: (!) 137/105 (!) 137/105 129/81 (!) 137/113  Pulse:  78 80 82  Resp:  20 20 20   Temp:  97.9 F (36.6 C) 97.8 F (36.6 C) 97.9 F (36.6 C)  TempSrc:  Oral Oral Oral  SpO2:  97% 99% 99%  Weight:    116.3 kg (256 lb 6.4 oz)  Height:        General: Pt is alert, awake, not in acute distress Cardiovascular: RRR, S1/S2 +, no rubs, no gallops Respiratory: CTA bilaterally, no wheezing, no rhonchi Abdominal: Soft, NT, ND, bowel sounds + Extremities: no edema, no cyanosis    The results of significant diagnostics from this hospitalization (including imaging, microbiology, ancillary and laboratory) are listed below for reference.     Microbiology: No results found for this or any previous visit (from the past 240 hour(s)).   Labs: BNP (last 3 results)  Recent Labs  12/29/15 0053 03/02/16 2359 05/27/16 2308  BNP 756.2* 930.4* 9,798.9*   Basic Metabolic Panel:  Recent Labs Lab 05/27/16 2155 05/29/16 0518 05/30/16 0447 05/31/16 0536 06/01/16 0409  NA 136 137 136 142 139  K 3.5 3.3* 3.0* 3.7 3.6  CL 102 105 102 104 102  CO2 23 24 25 29 28   GLUCOSE 103* 125* 150* 100* 114*  BUN 17 13 16 16 17   CREATININE 1.75* 1.95* 1.81* 1.92* 1.88*  CALCIUM 8.9 8.4* 8.6* 8.9 9.0  MG  --   --  2.2  --  2.3   Liver Function Tests: No results for input(s): AST, ALT, ALKPHOS, BILITOT, PROT, ALBUMIN in the last 168 hours. No results for input(s): LIPASE, AMYLASE in the last 168 hours. No results for input(s): AMMONIA in the last 168 hours. CBC:  Recent Labs Lab 05/27/16 2155  WBC 7.2  HGB 14.9  HCT 44.0  MCV 91.3  PLT 253   Cardiac Enzymes: No results for input(s):  CKTOTAL, CKMB, CKMBINDEX, TROPONINI in the last 168 hours. BNP: Invalid input(s): POCBNP CBG:  Recent Labs Lab 05/28/16 0517  GLUCAP 132*   D-Dimer No results for input(s): DDIMER in the last 72 hours. Hgb A1c No results for input(s): HGBA1C in the last 72 hours. Lipid Profile No results for input(s): CHOL, HDL, LDLCALC, TRIG, CHOLHDL, LDLDIRECT in the last 72 hours. Thyroid function studies No results for input(s): TSH, T4TOTAL, T3FREE, THYROIDAB  in the last 72 hours.  Invalid input(s): FREET3 Anemia work up No results for input(s): VITAMINB12, FOLATE, FERRITIN, TIBC, IRON, RETICCTPCT in the last 72 hours. Urinalysis    Component Value Date/Time   COLORURINE YELLOW 01/19/2015 0134   APPEARANCEUR CLEAR 01/19/2015 0134   LABSPEC 1.006 01/19/2015 0134   PHURINE 5.5 01/19/2015 0134   GLUCOSEU NEGATIVE 01/19/2015 0134   HGBUR NEGATIVE 01/19/2015 0134   BILIRUBINUR NEGATIVE 01/19/2015 0134   KETONESUR NEGATIVE 01/19/2015 0134   PROTEINUR NEGATIVE 01/19/2015 0134   UROBILINOGEN 0.2 01/19/2015 0134   NITRITE NEGATIVE 01/19/2015 0134   LEUKOCYTESUR NEGATIVE 01/19/2015 0134   Sepsis Labs Invalid input(s): PROCALCITONIN,  WBC,  LACTICIDVEN Microbiology No results found for this or any previous visit (from the past 240 hour(s)).   Time coordinating discharge: Over 30 minutes  SIGNED:   Damita Lack, MD  [facility] 06/01/2016, 9:08 AM Pager   If 7PM-7AM, please contact night-coverage [email or web address] Password [email or web address]

## 2016-06-01 NOTE — Progress Notes (Signed)
PROGRESS NOTE    Scott Salinas  FGH:[id number] DOB: 1970/09/17 DOA: 05/27/2016 PCP: No PCP Per Patient    Brief Narrative:  46 y.o. male with a past medical history significant for chronic systolic CHF EF 67%, CAD s/p CABG x4, HTN, and CKD III baseline Cr 1.7 who presents with dyspnea for 1 week.  The patient at baseline is dyspneic to walk short distances.  However, in the last 1-2 weeks, he has noticed worsening of his dyspnea to include dyspnea at rest or even from minor exertion.  He has worsening swelling in both legs and swelling in his scrotum.  He has had worsening of his chronic orthopnea.  There is no fever, chills, sputum production.  He reports regular adherence to his home diuretics and BP medicines, says he just didn't take them today.  He tried increasing his home Lasix without improvement, so he came to the ER.  Assessment & Plan:   Principal Problem:   Acute on chronic combined systolic and diastolic CHF (congestive heart failure) (HCC) Active Problems:   Accelerated hypertension   Coronary artery disease due to lipid rich plaque   CKD (chronic kidney disease) stage 3, GFR 30-59 ml/min   S/P CABG x 4   1. Acute on chronic systolic CHF:    "-lasix changed to po -Strict I/Os, daily weights - Thus far, net neg 15 L -monitor lytes daily -repeat echo improved ef now- ~40-45% otherwise unchanged.     2. Malignant HTN: improved  - Presented hypertensive at admission. -For now, continue home Imdur, hydralazine, carvedilol, and amlodipine -BP remained poorly controlled overall -patient reports of dietary non compliance.   3. Scrotal swelling:  - Suspect secondary to edema, but he endorses low abdominal pain, and a recent change, with his impression that this was unilateral swelling, will rule out hernia with [place]. -Scrotal US reviewed. Findings of large bilatereal hydroceles - for now, will continue to diurese as tolerated  4. Coronary disease secondary  prevention:  -Continue statin, aspirin -Continue HTN meds - currently stable at this time  5. CKD III:  -Creatinine at baseline now  -cont to avoid nephrotoxic drugs.   6. Drug seeking behavior -complains of scrotal and LE pain -per RN, pt had declined ultram and is wanting "real" pain medications -NCCSR reviewed. Patient was last prescribed 5 days of narcotics in 10/17, claims to be still taking them currently. Pt had mentioned to RN that he will obtain narcotics on his own (in [place]) if not prescribed here. -Presenting UDS was neg for opiates -Would continue to avoid narcotics  7. Likely OSA - Witnessed apnea while sleeping - For now, will continue CPAP when sleeping. But continues to refuse it overnight.  - Would greatly benefit from outpatient sleep study    DVT prophylaxis: Lovenox subQ Code Status: Full Family Communication: Patient can comprehend well.  Disposition Plan: Discharge today as his symptoms have resolved and back to his basline. I have explained him the importance of follow up. Appointment has been made to follow up.   Consultants:   None  Procedures:   None  Antimicrobials: Anti-infectives    None      Subjective: Feels back to his baseline, no complaints.    Objective: Vitals:   05/31/16 1614 05/31/16 1618 05/31/16 2141 06/01/16 0603  BP: (!) 137/105 (!) 137/105 129/81 (!) 137/113  Pulse:  78 80 82  Resp:  20 20 20   Temp:  97.9 F (36.6 C) 97.8 F (36.6 C)  97.9 F (36.6 C)  TempSrc:  Oral Oral Oral  SpO2:  97% 99% 99%  Weight:    116.3 kg (256 lb 6.4 oz)  Height:        Intake/Output Summary (Last 24 hours) at 06/01/16 3149 Last data filed at 06/01/16 7026  Gross per 24 hour  Intake             1440 ml  Output             4800 ml  Net            -3360 ml   Filed Weights   05/30/16 0605 05/31/16 0542 06/01/16 0603  Weight: 119.6 kg (263 lb 11.2 oz) 117.1 kg (258 lb 1.6 oz) 116.3 kg (256 lb 6.4 oz)     Examination:  General exam: Sitting in chair, in nad Respiratory system: normal chest rise, no audible wheezing Cardiovascular system: regular rhythm, s1-2 Gastrointestinal system: obese, soft, nondistended Central nervous system: No seizures, no tremors Extremities: no cyanosis, no joint deformities Skin: normal skin turgor, no rashes Psychiatry: affect normal// no auditory hallucinations.   Data Reviewed: I have personally reviewed following labs and imaging studies  CBC:  Recent Labs Lab 05/27/16 2155  WBC 7.2  HGB 14.9  HCT 44.0  MCV 91.3  PLT 378   Basic Metabolic Panel:  Recent Labs Lab 05/27/16 2155 05/29/16 0518 05/30/16 0447 05/31/16 0536 06/01/16 0409  NA 136 137 136 142 139  K 3.5 3.3* 3.0* 3.7 3.6  CL 102 105 102 104 102  CO2 23 24 25 29 28   GLUCOSE 103* 125* 150* 100* 114*  BUN 17 13 16 16 17   CREATININE 1.75* 1.95* 1.81* 1.92* 1.88*  CALCIUM 8.9 8.4* 8.6* 8.9 9.0  MG  --   --  2.2  --  2.3   GFR: Estimated Creatinine Clearance: 62.4 mL/min (by C-G formula based on SCr of 1.88 mg/dL (H)). Liver Function Tests: No results for input(s): AST, ALT, ALKPHOS, BILITOT, PROT, ALBUMIN in the last 168 hours. No results for input(s): LIPASE, AMYLASE in the last 168 hours. No results for input(s): AMMONIA in the last 168 hours. Coagulation Profile: No results for input(s): INR, PROTIME in the last 168 hours. Cardiac Enzymes: No results for input(s): CKTOTAL, CKMB, CKMBINDEX, TROPONINI in the last 168 hours. BNP (last 3 results) No results for input(s): PROBNP in the last 8760 hours. HbA1C: No results for input(s): HGBA1C in the last 72 hours. CBG:  Recent Labs Lab 05/28/16 0517  GLUCAP 132*   Lipid Profile: No results for input(s): CHOL, HDL, LDLCALC, TRIG, CHOLHDL, LDLDIRECT in the last 72 hours. Thyroid Function Tests: No results for input(s): TSH, T4TOTAL, FREET4, T3FREE, THYROIDAB in the last 72 hours. Anemia Panel: No results for  input(s): VITAMINB12, FOLATE, FERRITIN, TIBC, IRON, RETICCTPCT in the last 72 hours. Sepsis Labs: No results for input(s): PROCALCITON, LATICACIDVEN in the last 168 hours.  No results found for this or any previous visit (from the past 240 hour(s)).   Radiology Studies: No results found.  Scheduled Meds: . amLODipine  10 mg Oral Daily  . aspirin EC  81 mg Oral Daily  . atorvastatin  40 mg Oral q1800  . carvedilol  25 mg Oral BID WC  . enoxaparin (LOVENOX) injection  40 mg Subcutaneous Daily  . furosemide  60 mg Oral Daily  . hydrALAZINE  75 mg Oral Q8H  . isosorbide mononitrate  30 mg Oral Daily  . pneumococcal 23 valent vaccine  0.5 mL Intramuscular Tomorrow-1000  . potassium chloride  40 mEq Oral Daily  . sodium chloride flush  3 mL Intravenous Q12H   Continuous Infusions:   LOS: 4 days   Scott Arsenio Loader, MD [facility] Pager [phone]  If 7PM-7AM, please contact night-coverage [email or web address] Password [email or web address] 06/01/2016, 9:03 AM

## 2016-06-01 NOTE — Progress Notes (Signed)
Pt refused bed alarm on. Will continue to do hourly rounding. 

## 2016-06-01 NOTE — Progress Notes (Signed)
Pt states he is in chronic pain, refusing pain medication, tramadol 50mg . Pt states he wants real pain medication, and states he will get it when he gets discharged. Will inform MD and continue to monitor  Jobeth Pangilinan

## 2016-06-12 ENCOUNTER — Encounter: Payer: Self-pay | Admitting: Physician Assistant

## 2016-06-12 ENCOUNTER — Ambulatory Visit (INDEPENDENT_AMBULATORY_CARE_PROVIDER_SITE_OTHER): Payer: Medicaid Other | Admitting: Physician Assistant

## 2016-06-12 VITALS — BP 140/100 | HR 90 | Ht 69.0 in | Wt 268.0 lb

## 2016-06-12 DIAGNOSIS — I2510 Atherosclerotic heart disease of native coronary artery without angina pectoris: Secondary | ICD-10-CM

## 2016-06-12 DIAGNOSIS — I5043 Acute on chronic combined systolic (congestive) and diastolic (congestive) heart failure: Secondary | ICD-10-CM

## 2016-06-12 DIAGNOSIS — I2583 Coronary atherosclerosis due to lipid rich plaque: Secondary | ICD-10-CM | POA: Diagnosis not present

## 2016-06-12 DIAGNOSIS — N183 Chronic kidney disease, stage 3 unspecified: Secondary | ICD-10-CM

## 2016-06-12 DIAGNOSIS — R0609 Other forms of dyspnea: Secondary | ICD-10-CM

## 2016-06-12 DIAGNOSIS — I10 Essential (primary) hypertension: Secondary | ICD-10-CM

## 2016-06-12 MED ORDER — TORSEMIDE 20 MG PO TABS: 40.0000 mg | ORAL_TABLET | Freq: Two times a day (BID) | ORAL | 11 refills | Status: DC

## 2016-06-12 MED ORDER — ISOSORBIDE MONONITRATE ER 30 MG PO TB24: 30.0000 mg | ORAL_TABLET | Freq: Every day | ORAL | 11 refills | Status: DC

## 2016-06-12 NOTE — Patient Instructions (Addendum)
Medication Instructions:  1) STOP LASIX 2) START DEMADEX 40 mg (two tablets) TWICE DAILY  Labwork: None  Testing/Procedures: None  Follow-Up: Your physician recommends that you schedule a follow-up appointment in [place] with Dr. Meda Coffee.  Any Other Special Instructions Will Be Listed Below (If Applicable).     If you need a refill on your cardiac medications before your next appointment, please call your pharmacy.
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
## 2016-07-14 ENCOUNTER — Ambulatory Visit (INDEPENDENT_AMBULATORY_CARE_PROVIDER_SITE_OTHER): Payer: Medicaid Other | Admitting: Cardiology

## 2016-07-14 ENCOUNTER — Encounter (INDEPENDENT_AMBULATORY_CARE_PROVIDER_SITE_OTHER): Payer: Self-pay

## 2016-07-14 ENCOUNTER — Encounter: Payer: Self-pay | Admitting: Cardiology

## 2016-07-14 VITALS — BP 116/64 | HR 74 | Ht 69.0 in | Wt 262.0 lb

## 2016-07-14 DIAGNOSIS — R42 Dizziness and giddiness: Secondary | ICD-10-CM | POA: Insufficient documentation

## 2016-07-14 DIAGNOSIS — I9589 Other hypotension: Secondary | ICD-10-CM | POA: Diagnosis not present

## 2016-07-14 DIAGNOSIS — I5043 Acute on chronic combined systolic (congestive) and diastolic (congestive) heart failure: Secondary | ICD-10-CM | POA: Diagnosis not present

## 2016-07-14 DIAGNOSIS — I2583 Coronary atherosclerosis due to lipid rich plaque: Secondary | ICD-10-CM

## 2016-07-14 DIAGNOSIS — Z951 Presence of aortocoronary bypass graft: Secondary | ICD-10-CM

## 2016-07-14 DIAGNOSIS — E782 Mixed hyperlipidemia: Secondary | ICD-10-CM | POA: Diagnosis not present

## 2016-07-14 DIAGNOSIS — I959 Hypotension, unspecified: Secondary | ICD-10-CM | POA: Insufficient documentation

## 2016-07-14 DIAGNOSIS — N183 Chronic kidney disease, stage 3 unspecified: Secondary | ICD-10-CM

## 2016-07-14 DIAGNOSIS — I2510 Atherosclerotic heart disease of native coronary artery without angina pectoris: Secondary | ICD-10-CM | POA: Diagnosis not present

## 2016-07-14 DIAGNOSIS — I255 Ischemic cardiomyopathy: Secondary | ICD-10-CM

## 2016-07-14 MED ORDER — HYDRALAZINE HCL 25 MG PO TABS: 25.0000 mg | ORAL_TABLET | Freq: Three times a day (TID) | ORAL | 2 refills | Status: DC

## 2016-07-14 MED ORDER — TORSEMIDE 20 MG PO TABS: 20.0000 mg | ORAL_TABLET | Freq: Two times a day (BID) | ORAL | 2 refills | Status: DC

## 2016-07-14 NOTE — Patient Instructions (Signed)
Medication Instructions:   DECREASE YOUR TORSEMIDE TO 20 MG TWICE DAILY  DECREASE YOUR HYDRALAZINE TO 25 MG THREE TIMES DAILY    Labwork:  TODAY--BMET AND PRO-BNP     Follow-Up:  2 MONTHS WITH MICHELLE LENZE PA-C      If you need a refill on your cardiac medications before your next appointment, please call your pharmacy.
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
## 2016-07-16 LAB — BASIC METABOLIC PANEL
BUN/Creatinine Ratio: 11 (ref 9–20)
BUN: 21 mg/dL (ref 6–24)
CO2: 23 mmol/L (ref 18–29)
Calcium: 8.7 mg/dL (ref 8.7–10.2)
Chloride: 97 mmol/L (ref 96–106)
Creatinine, Ser: 1.93 mg/dL — ABNORMAL HIGH (ref 0.76–1.27)
GFR calc Af Amer: 47 mL/min/{1.73_m2} — ABNORMAL LOW (ref >59)
GFR calc non Af Amer: 41 mL/min/{1.73_m2} — ABNORMAL LOW (ref >59)
Glucose: 100 mg/dL — ABNORMAL HIGH (ref 65–99)
Potassium: 3.1 mmol/L — ABNORMAL LOW (ref 3.5–5.2)
Sodium: 138 mmol/L (ref 134–144)

## 2016-07-16 LAB — PRO B NATRIURETIC PEPTIDE: NT-Pro BNP: 1532 pg/mL — ABNORMAL HIGH (ref 0–121)

## 2016-09-18 ENCOUNTER — Ambulatory Visit: Payer: Medicaid Other | Admitting: Physician Assistant

## 2016-09-18 ENCOUNTER — Ambulatory Visit: Payer: Medicaid Other | Admitting: Cardiology

## 2016-09-19 ENCOUNTER — Ambulatory Visit: Payer: Medicaid Other | Admitting: Cardiology

## 2016-09-20 ENCOUNTER — Encounter: Payer: Self-pay | Admitting: Cardiology

## 2016-11-03 ENCOUNTER — Other Ambulatory Visit: Payer: Self-pay | Admitting: Internal Medicine

## 2016-11-25 ENCOUNTER — Other Ambulatory Visit: Payer: Self-pay | Admitting: Internal Medicine

## 2017-02-11 ENCOUNTER — Encounter (HOSPITAL_COMMUNITY): Payer: Self-pay | Admitting: Emergency Medicine

## 2017-02-11 ENCOUNTER — Emergency Department (HOSPITAL_COMMUNITY): Payer: Medicaid Other

## 2017-02-11 DIAGNOSIS — I071 Rheumatic tricuspid insufficiency: Secondary | ICD-10-CM | POA: Diagnosis present

## 2017-02-11 DIAGNOSIS — Z6836 Body mass index (BMI) 36.0-36.9, adult: Secondary | ICD-10-CM

## 2017-02-11 DIAGNOSIS — I160 Hypertensive urgency: Secondary | ICD-10-CM | POA: Diagnosis present

## 2017-02-11 DIAGNOSIS — N183 Chronic kidney disease, stage 3 (moderate): Secondary | ICD-10-CM | POA: Diagnosis present

## 2017-02-11 DIAGNOSIS — I2583 Coronary atherosclerosis due to lipid rich plaque: Secondary | ICD-10-CM | POA: Diagnosis present

## 2017-02-11 DIAGNOSIS — I2510 Atherosclerotic heart disease of native coronary artery without angina pectoris: Secondary | ICD-10-CM | POA: Diagnosis present

## 2017-02-11 DIAGNOSIS — F17210 Nicotine dependence, cigarettes, uncomplicated: Secondary | ICD-10-CM | POA: Diagnosis present

## 2017-02-11 DIAGNOSIS — F419 Anxiety disorder, unspecified: Secondary | ICD-10-CM | POA: Diagnosis present

## 2017-02-11 DIAGNOSIS — Z79899 Other long term (current) drug therapy: Secondary | ICD-10-CM

## 2017-02-11 DIAGNOSIS — N179 Acute kidney failure, unspecified: Secondary | ICD-10-CM | POA: Diagnosis not present

## 2017-02-11 DIAGNOSIS — I252 Old myocardial infarction: Secondary | ICD-10-CM

## 2017-02-11 DIAGNOSIS — I5043 Acute on chronic combined systolic (congestive) and diastolic (congestive) heart failure: Secondary | ICD-10-CM | POA: Diagnosis present

## 2017-02-11 DIAGNOSIS — I255 Ischemic cardiomyopathy: Secondary | ICD-10-CM | POA: Diagnosis present

## 2017-02-11 DIAGNOSIS — F1210 Cannabis abuse, uncomplicated: Secondary | ICD-10-CM | POA: Diagnosis present

## 2017-02-11 DIAGNOSIS — Z8249 Family history of ischemic heart disease and other diseases of the circulatory system: Secondary | ICD-10-CM

## 2017-02-11 DIAGNOSIS — T502X5A Adverse effect of carbonic-anhydrase inhibitors, benzothiadiazides and other diuretics, initial encounter: Secondary | ICD-10-CM | POA: Diagnosis present

## 2017-02-11 DIAGNOSIS — I130 Hypertensive heart and chronic kidney disease with heart failure and stage 1 through stage 4 chronic kidney disease, or unspecified chronic kidney disease: Principal | ICD-10-CM | POA: Diagnosis present

## 2017-02-11 DIAGNOSIS — E1122 Type 2 diabetes mellitus with diabetic chronic kidney disease: Secondary | ICD-10-CM | POA: Diagnosis present

## 2017-02-11 DIAGNOSIS — I248 Other forms of acute ischemic heart disease: Secondary | ICD-10-CM | POA: Diagnosis present

## 2017-02-11 DIAGNOSIS — G4733 Obstructive sleep apnea (adult) (pediatric): Secondary | ICD-10-CM | POA: Diagnosis present

## 2017-02-11 DIAGNOSIS — Z9114 Patient's other noncompliance with medication regimen: Secondary | ICD-10-CM

## 2017-02-11 DIAGNOSIS — E785 Hyperlipidemia, unspecified: Secondary | ICD-10-CM | POA: Diagnosis present

## 2017-02-11 DIAGNOSIS — E876 Hypokalemia: Secondary | ICD-10-CM | POA: Diagnosis present

## 2017-02-11 DIAGNOSIS — E669 Obesity, unspecified: Secondary | ICD-10-CM | POA: Diagnosis present

## 2017-02-11 DIAGNOSIS — Z7982 Long term (current) use of aspirin: Secondary | ICD-10-CM

## 2017-02-11 DIAGNOSIS — Z955 Presence of coronary angioplasty implant and graft: Secondary | ICD-10-CM

## 2017-02-11 DIAGNOSIS — Z888 Allergy status to other drugs, medicaments and biological substances status: Secondary | ICD-10-CM

## 2017-02-11 LAB — CBC
HCT: 45.7 % (ref 39.0–52.0)
Hemoglobin: 15.3 g/dL (ref 13.0–17.0)
MCH: 30.4 pg (ref 26.0–34.0)
MCHC: 33.5 g/dL (ref 30.0–36.0)
MCV: 90.9 fL (ref 78.0–100.0)
PLATELETS: 214 10*3/uL (ref 150–400)
RBC: 5.03 MIL/uL (ref 4.22–5.81)
RDW: 15.7 % — ABNORMAL HIGH (ref 11.5–15.5)
WBC: 6.2 10*3/uL (ref 4.0–10.5)

## 2017-02-11 LAB — BASIC METABOLIC PANEL
Anion gap: 11 (ref 5–15)
BUN: 18 mg/dL (ref 6–20)
CALCIUM: 8.3 mg/dL — AB (ref 8.9–10.3)
CO2: 22 mmol/L (ref 22–32)
CREATININE: 2.08 mg/dL — AB (ref 0.61–1.24)
Chloride: 99 mmol/L — ABNORMAL LOW (ref 101–111)
GFR calc non Af Amer: 37 mL/min — ABNORMAL LOW (ref >60)
GFR, EST AFRICAN AMERICAN: 42 mL/min — AB (ref >60)
GLUCOSE: 130 mg/dL — AB (ref 65–99)
Potassium: 3 mmol/L — ABNORMAL LOW (ref 3.5–5.1)
Sodium: 132 mmol/L — ABNORMAL LOW (ref 135–145)

## 2017-02-11 LAB — I-STAT TROPONIN, ED: TROPONIN I, POC: 0.07 ng/mL (ref 0.00–0.08)

## 2017-02-11 NOTE — ED Triage Notes (Signed)
Pt c/o chest pain with expiration that radiates to the right side of his body, abdominal distension and shortness of breath. Pt reports hx CABG and stents.

## 2017-02-12 ENCOUNTER — Inpatient Hospital Stay (HOSPITAL_COMMUNITY): Payer: Medicaid Other

## 2017-02-12 ENCOUNTER — Other Ambulatory Visit: Payer: Self-pay

## 2017-02-12 ENCOUNTER — Inpatient Hospital Stay (HOSPITAL_COMMUNITY)
Admission: EM | Admit: 2017-02-12 | Discharge: 2017-02-15 | DRG: 291 | Disposition: A | Discharge status: Home / Self Care | Payer: Medicaid Other | Attending: Internal Medicine | Admitting: Internal Medicine

## 2017-02-12 DIAGNOSIS — R9431 Abnormal electrocardiogram [ECG] [EKG]: Secondary | ICD-10-CM | POA: Diagnosis not present

## 2017-02-12 DIAGNOSIS — I5043 Acute on chronic combined systolic (congestive) and diastolic (congestive) heart failure: Secondary | ICD-10-CM | POA: Diagnosis present

## 2017-02-12 DIAGNOSIS — T502X5A Adverse effect of carbonic-anhydrase inhibitors, benzothiadiazides and other diuretics, initial encounter: Secondary | ICD-10-CM | POA: Diagnosis present

## 2017-02-12 DIAGNOSIS — Z951 Presence of aortocoronary bypass graft: Secondary | ICD-10-CM

## 2017-02-12 DIAGNOSIS — I340 Nonrheumatic mitral (valve) insufficiency: Secondary | ICD-10-CM

## 2017-02-12 DIAGNOSIS — Z8249 Family history of ischemic heart disease and other diseases of the circulatory system: Secondary | ICD-10-CM | POA: Diagnosis not present

## 2017-02-12 DIAGNOSIS — I252 Old myocardial infarction: Secondary | ICD-10-CM | POA: Diagnosis not present

## 2017-02-12 DIAGNOSIS — I10 Essential (primary) hypertension: Secondary | ICD-10-CM | POA: Diagnosis not present

## 2017-02-12 DIAGNOSIS — Z720 Tobacco use: Secondary | ICD-10-CM | POA: Diagnosis present

## 2017-02-12 DIAGNOSIS — I2583 Coronary atherosclerosis due to lipid rich plaque: Secondary | ICD-10-CM | POA: Diagnosis present

## 2017-02-12 DIAGNOSIS — I5042 Chronic combined systolic (congestive) and diastolic (congestive) heart failure: Secondary | ICD-10-CM | POA: Diagnosis present

## 2017-02-12 DIAGNOSIS — E876 Hypokalemia: Secondary | ICD-10-CM | POA: Diagnosis present

## 2017-02-12 DIAGNOSIS — N179 Acute kidney failure, unspecified: Secondary | ICD-10-CM | POA: Diagnosis not present

## 2017-02-12 DIAGNOSIS — N183 Chronic kidney disease, stage 3 unspecified: Secondary | ICD-10-CM | POA: Diagnosis present

## 2017-02-12 DIAGNOSIS — T783XXA Angioneurotic edema, initial encounter: Secondary | ICD-10-CM

## 2017-02-12 DIAGNOSIS — Z6836 Body mass index (BMI) 36.0-36.9, adult: Secondary | ICD-10-CM | POA: Diagnosis not present

## 2017-02-12 DIAGNOSIS — E669 Obesity, unspecified: Secondary | ICD-10-CM | POA: Diagnosis present

## 2017-02-12 DIAGNOSIS — I361 Nonrheumatic tricuspid (valve) insufficiency: Secondary | ICD-10-CM | POA: Diagnosis not present

## 2017-02-12 DIAGNOSIS — I2510 Atherosclerotic heart disease of native coronary artery without angina pectoris: Secondary | ICD-10-CM

## 2017-02-12 DIAGNOSIS — F17210 Nicotine dependence, cigarettes, uncomplicated: Secondary | ICD-10-CM | POA: Diagnosis present

## 2017-02-12 DIAGNOSIS — I509 Heart failure, unspecified: Secondary | ICD-10-CM

## 2017-02-12 DIAGNOSIS — I255 Ischemic cardiomyopathy: Secondary | ICD-10-CM | POA: Diagnosis present

## 2017-02-12 DIAGNOSIS — E785 Hyperlipidemia, unspecified: Secondary | ICD-10-CM | POA: Diagnosis present

## 2017-02-12 DIAGNOSIS — I071 Rheumatic tricuspid insufficiency: Secondary | ICD-10-CM | POA: Diagnosis present

## 2017-02-12 DIAGNOSIS — Z9114 Patient's other noncompliance with medication regimen: Secondary | ICD-10-CM | POA: Diagnosis not present

## 2017-02-12 DIAGNOSIS — T464X5A Adverse effect of angiotensin-converting-enzyme inhibitors, initial encounter: Secondary | ICD-10-CM | POA: Diagnosis present

## 2017-02-12 DIAGNOSIS — Z955 Presence of coronary angioplasty implant and graft: Secondary | ICD-10-CM | POA: Diagnosis not present

## 2017-02-12 DIAGNOSIS — I248 Other forms of acute ischemic heart disease: Secondary | ICD-10-CM | POA: Diagnosis present

## 2017-02-12 DIAGNOSIS — I160 Hypertensive urgency: Secondary | ICD-10-CM | POA: Diagnosis present

## 2017-02-12 DIAGNOSIS — G4733 Obstructive sleep apnea (adult) (pediatric): Secondary | ICD-10-CM | POA: Diagnosis present

## 2017-02-12 DIAGNOSIS — F1210 Cannabis abuse, uncomplicated: Secondary | ICD-10-CM | POA: Diagnosis present

## 2017-02-12 DIAGNOSIS — E1122 Type 2 diabetes mellitus with diabetic chronic kidney disease: Secondary | ICD-10-CM | POA: Diagnosis present

## 2017-02-12 DIAGNOSIS — R079 Chest pain, unspecified: Secondary | ICD-10-CM

## 2017-02-12 DIAGNOSIS — I130 Hypertensive heart and chronic kidney disease with heart failure and stage 1 through stage 4 chronic kidney disease, or unspecified chronic kidney disease: Secondary | ICD-10-CM | POA: Diagnosis present

## 2017-02-12 DIAGNOSIS — F419 Anxiety disorder, unspecified: Secondary | ICD-10-CM | POA: Diagnosis present

## 2017-02-12 LAB — ECHOCARDIOGRAM COMPLETE
AOASC: 38 cm
AVPHT: 579 ms
CHL CUP RV SYS PRESS: 40 mmHg
E decel time: 141 msec
FS: 8 % — AB (ref 28–44)
Height: 69 in
IVS/LV PW RATIO, ED: 0.92
LA diam index: 2.27 cm/m2
LASIZE: 56 mm
LAVOL: 134 mL
LAVOLA4C: 135 mL
LAVOLIN: 54.3 mL/m2
LEFT ATRIUM END SYS DIAM: 56 mm
LV TDI E'LATERAL: 8.63
LVELAT: 8.63 cm/s
LVOT area: 3.14 cm2
LVOT diameter: 20 mm
MV Dec: 141
MV pk E vel: 2.9 m/s
PISA EROA: 0.1 cm2
PV Reg grad dias: 7 mmHg
PV Reg vel dias: 128 cm/s
PW: 12 mm — AB (ref 0.6–1.1)
Reg peak vel: 252 cm/s
TAPSE: 20.1 mm
TRMAXVEL: 252 cm/s
VTI: 145 cm
Weight: 4222.4 oz

## 2017-02-12 LAB — RAPID URINE DRUG SCREEN, HOSP PERFORMED
Amphetamines: NOT DETECTED
BARBITURATES: NOT DETECTED
BENZODIAZEPINES: NOT DETECTED
COCAINE: NOT DETECTED
Opiates: POSITIVE — AB
TETRAHYDROCANNABINOL: POSITIVE — AB

## 2017-02-12 LAB — GLUCOSE, CAPILLARY
GLUCOSE-CAPILLARY: 133 mg/dL — AB (ref 65–99)
Glucose-Capillary: 158 mg/dL — ABNORMAL HIGH (ref 65–99)

## 2017-02-12 LAB — BASIC METABOLIC PANEL
ANION GAP: 9 (ref 5–15)
BUN: 16 mg/dL (ref 6–20)
CHLORIDE: 102 mmol/L (ref 101–111)
CO2: 28 mmol/L (ref 22–32)
CREATININE: 2.14 mg/dL — AB (ref 0.61–1.24)
Calcium: 8.6 mg/dL — ABNORMAL LOW (ref 8.9–10.3)
GFR calc non Af Amer: 35 mL/min — ABNORMAL LOW (ref >60)
GFR, EST AFRICAN AMERICAN: 41 mL/min — AB (ref >60)
Glucose, Bld: 111 mg/dL — ABNORMAL HIGH (ref 65–99)
Potassium: 3.6 mmol/L (ref 3.5–5.1)
SODIUM: 139 mmol/L (ref 135–145)

## 2017-02-12 LAB — LIPID PANEL
Cholesterol: 170 mg/dL (ref 0–200)
HDL: 34 mg/dL — ABNORMAL LOW (ref >40)
LDL CALC: 104 mg/dL — AB (ref 0–99)
Total CHOL/HDL Ratio: 5 RATIO
Triglycerides: 159 mg/dL — ABNORMAL HIGH (ref <150)
VLDL: 32 mg/dL (ref 0–40)

## 2017-02-12 LAB — TROPONIN I
TROPONIN I: 0.13 ng/mL — AB (ref <0.03)
TROPONIN I: 0.13 ng/mL — AB (ref <0.03)
TROPONIN I: 0.13 ng/mL — AB (ref <0.03)

## 2017-02-12 LAB — HIV ANTIBODY (ROUTINE TESTING W REFLEX): HIV Screen 4th Generation wRfx: NONREACTIVE

## 2017-02-12 LAB — HEMOGLOBIN A1C
Hgb A1c MFr Bld: 6.8 % — ABNORMAL HIGH (ref 4.8–5.6)
Mean Plasma Glucose: 148.46 mg/dL

## 2017-02-12 LAB — HEPARIN LEVEL (UNFRACTIONATED)
HEPARIN UNFRACTIONATED: 0.37 [IU]/mL (ref 0.30–0.70)
Heparin Unfractionated: 0.21 IU/mL — ABNORMAL LOW (ref 0.30–0.70)

## 2017-02-12 LAB — BRAIN NATRIURETIC PEPTIDE: B Natriuretic Peptide: 1533 pg/mL — ABNORMAL HIGH (ref 0.0–100.0)

## 2017-02-12 LAB — MAGNESIUM: Magnesium: 2.1 mg/dL (ref 1.7–2.4)

## 2017-02-12 MED ORDER — ACETAMINOPHEN 325 MG PO TABS
650.0000 mg | ORAL_TABLET | ORAL | Status: DC | PRN
  Administered 2017-02-12 – 2017-02-13 (×2): 650 mg via ORAL
  Filled 2017-02-12 (×3): qty 2

## 2017-02-12 MED ORDER — ISOSORBIDE MONONITRATE ER 30 MG PO TB24
30.0000 mg | ORAL_TABLET | Freq: Every day | ORAL | Status: DC
  Administered 2017-02-12: 30 mg via ORAL
  Filled 2017-02-12 (×2): qty 1

## 2017-02-12 MED ORDER — HEPARIN BOLUS VIA INFUSION
4000.0000 [IU] | Freq: Once | INTRAVENOUS | Status: AC
  Administered 2017-02-12: 4000 [IU] via INTRAVENOUS
  Filled 2017-02-12: qty 4000

## 2017-02-12 MED ORDER — NITROGLYCERIN 0.4 MG SL SUBL
0.4000 mg | SUBLINGUAL_TABLET | SUBLINGUAL | Status: DC | PRN
  Administered 2017-02-12 (×2): 0.4 mg via SUBLINGUAL
  Filled 2017-02-12: qty 1

## 2017-02-12 MED ORDER — INSULIN ASPART 100 UNIT/ML ~~LOC~~ SOLN: 0.0000 [IU] | Freq: Every day | SUBCUTANEOUS | Status: DC

## 2017-02-12 MED ORDER — HEPARIN (PORCINE) IN NACL 100-0.45 UNIT/ML-% IJ SOLN
1600.0000 [IU]/h | INTRAMUSCULAR | Status: DC
  Administered 2017-02-12: 1300 [IU]/h via INTRAVENOUS
  Administered 2017-02-12: 1600 [IU]/h via INTRAVENOUS
  Filled 2017-02-12 (×2): qty 250

## 2017-02-12 MED ORDER — ASPIRIN EC 81 MG PO TBEC
81.0000 mg | DELAYED_RELEASE_TABLET | Freq: Every day | ORAL | Status: DC
  Administered 2017-02-12 – 2017-02-15 (×4): 81 mg via ORAL
  Filled 2017-02-12 (×4): qty 1

## 2017-02-12 MED ORDER — HYDRALAZINE HCL 25 MG PO TABS
25.0000 mg | ORAL_TABLET | Freq: Three times a day (TID) | ORAL | Status: DC
  Administered 2017-02-12 (×2): 25 mg via ORAL
  Filled 2017-02-12 (×2): qty 1

## 2017-02-12 MED ORDER — MORPHINE SULFATE (PF) 4 MG/ML IV SOLN
2.0000 mg | INTRAVENOUS | Status: DC | PRN
  Administered 2017-02-12 – 2017-02-15 (×13): 2 mg via INTRAVENOUS
  Filled 2017-02-12 (×13): qty 1

## 2017-02-12 MED ORDER — ZOLPIDEM TARTRATE 5 MG PO TABS: 5.0000 mg | ORAL_TABLET | Freq: Every evening | ORAL | Status: DC | PRN

## 2017-02-12 MED ORDER — HYDRALAZINE HCL 20 MG/ML IJ SOLN: 5.0000 mg | INTRAMUSCULAR | Status: DC | PRN

## 2017-02-12 MED ORDER — FUROSEMIDE 10 MG/ML IJ SOLN
40.0000 mg | Freq: Once | INTRAMUSCULAR | Status: AC
  Administered 2017-02-12: 40 mg via INTRAVENOUS
  Filled 2017-02-12: qty 4

## 2017-02-12 MED ORDER — SODIUM CHLORIDE 0.9% FLUSH: 3.0000 mL | INTRAVENOUS | Status: DC | PRN

## 2017-02-12 MED ORDER — POTASSIUM CHLORIDE 20 MEQ/15ML (10%) PO SOLN
60.0000 meq | Freq: Once | ORAL | Status: AC
  Administered 2017-02-12: 60 meq via ORAL
  Filled 2017-02-12: qty 45

## 2017-02-12 MED ORDER — POTASSIUM CHLORIDE CRYS ER 10 MEQ PO TBCR
EXTENDED_RELEASE_TABLET | ORAL | Status: AC
  Filled 2017-02-12: qty 4

## 2017-02-12 MED ORDER — HYDROXYZINE HCL 10 MG PO TABS: 10.0000 mg | ORAL_TABLET | Freq: Three times a day (TID) | ORAL | Status: DC | PRN

## 2017-02-12 MED ORDER — CARVEDILOL 25 MG PO TABS
25.0000 mg | ORAL_TABLET | Freq: Two times a day (BID) | ORAL | Status: DC
  Administered 2017-02-12: 25 mg via ORAL
  Filled 2017-02-12: qty 1

## 2017-02-12 MED ORDER — FUROSEMIDE 10 MG/ML IJ SOLN
60.0000 mg | Freq: Two times a day (BID) | INTRAMUSCULAR | Status: DC
  Administered 2017-02-12: 60 mg via INTRAVENOUS
  Filled 2017-02-12: qty 6

## 2017-02-12 MED ORDER — HEPARIN SODIUM (PORCINE) 5000 UNIT/ML IJ SOLN: 5000.0000 [IU] | Freq: Three times a day (TID) | INTRAMUSCULAR | Status: DC

## 2017-02-12 MED ORDER — ATORVASTATIN CALCIUM 40 MG PO TABS
40.0000 mg | ORAL_TABLET | Freq: Every day | ORAL | Status: DC
  Administered 2017-02-12 – 2017-02-14 (×3): 40 mg via ORAL
  Filled 2017-02-12 (×3): qty 1

## 2017-02-12 MED ORDER — ONDANSETRON HCL 4 MG/2ML IJ SOLN: 4.0000 mg | Freq: Four times a day (QID) | INTRAMUSCULAR | Status: DC | PRN

## 2017-02-12 MED ORDER — HYDRALAZINE HCL 50 MG PO TABS
50.0000 mg | ORAL_TABLET | Freq: Three times a day (TID) | ORAL | Status: DC
  Administered 2017-02-12 – 2017-02-15 (×10): 50 mg via ORAL
  Filled 2017-02-12 (×10): qty 1

## 2017-02-12 MED ORDER — INSULIN ASPART 100 UNIT/ML ~~LOC~~ SOLN
0.0000 [IU] | Freq: Three times a day (TID) | SUBCUTANEOUS | Status: DC
  Administered 2017-02-15: 1 [IU] via SUBCUTANEOUS

## 2017-02-12 MED ORDER — ASPIRIN EC 81 MG PO TBEC: 81.0000 mg | DELAYED_RELEASE_TABLET | Freq: Every day | ORAL | Status: DC

## 2017-02-12 MED ORDER — MORPHINE SULFATE (PF) 4 MG/ML IV SOLN
4.0000 mg | Freq: Once | INTRAVENOUS | Status: AC
  Administered 2017-02-12: 4 mg via INTRAVENOUS
  Filled 2017-02-12: qty 1

## 2017-02-12 MED ORDER — NICOTINE 21 MG/24HR TD PT24
21.0000 mg | MEDICATED_PATCH | Freq: Every day | TRANSDERMAL | Status: DC
  Administered 2017-02-12 – 2017-02-15 (×3): 21 mg via TRANSDERMAL
  Filled 2017-02-12 (×4): qty 1

## 2017-02-12 MED ORDER — CARVEDILOL 12.5 MG PO TABS
12.5000 mg | ORAL_TABLET | Freq: Two times a day (BID) | ORAL | Status: DC
  Administered 2017-02-12 – 2017-02-15 (×6): 12.5 mg via ORAL
  Filled 2017-02-12 (×6): qty 1

## 2017-02-12 MED ORDER — INFLUENZA VAC SPLIT QUAD 0.5 ML IM SUSY
0.5000 mL | PREFILLED_SYRINGE | INTRAMUSCULAR | Status: DC
  Filled 2017-02-12: qty 0.5

## 2017-02-12 MED ORDER — AMLODIPINE BESYLATE 10 MG PO TABS
10.0000 mg | ORAL_TABLET | Freq: Every day | ORAL | Status: DC
  Administered 2017-02-12 – 2017-02-15 (×4): 10 mg via ORAL
  Filled 2017-02-12: qty 1
  Filled 2017-02-12: qty 2
  Filled 2017-02-12 (×2): qty 1

## 2017-02-12 MED ORDER — SODIUM CHLORIDE 0.9% FLUSH
3.0000 mL | Freq: Two times a day (BID) | INTRAVENOUS | Status: DC
  Administered 2017-02-12 – 2017-02-14 (×7): 3 mL via INTRAVENOUS

## 2017-02-12 MED ORDER — POTASSIUM CHLORIDE CRYS ER 20 MEQ PO TBCR
40.0000 meq | EXTENDED_RELEASE_TABLET | Freq: Two times a day (BID) | ORAL | Status: AC
  Administered 2017-02-12 (×2): 40 meq via ORAL
  Filled 2017-02-12: qty 2

## 2017-02-12 MED ORDER — FUROSEMIDE 10 MG/ML IJ SOLN
80.0000 mg | Freq: Two times a day (BID) | INTRAMUSCULAR | Status: DC
  Administered 2017-02-12 – 2017-02-13 (×2): 80 mg via INTRAVENOUS
  Filled 2017-02-12 (×2): qty 8

## 2017-02-12 MED ORDER — SODIUM CHLORIDE 0.9 % IV SOLN: 250.0000 mL | INTRAVENOUS | Status: DC | PRN
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
## 2017-02-12 NOTE — ED Notes (Signed)
Pt denies chest pain at presen.

## 2017-02-12 NOTE — Care Management Note (Signed)
Case Management Note  Patient Details  Name: Gabreal Worton MRN: [id number] Date of Birth: 24-Sep-1970  Subjective/Objective:   CHF                Action/Plan: Patient known to me from admission last year; Patient is from [place] but is staying in [place] on probation. He has not been taking his medication because he stated that he could not reach his doctor. CM talked to patient about the [facility] last admission, he is aware of the Clinic; CM encouraged patient to follow up there at discharge/ apt to be made at discharge; he is independent of all of his ADL's; lives with a friend. CM will continue to follow for DCP  Expected Discharge Date:   possibly 02/16/2017               Expected Discharge Plan:  Home/Self Care  In-House Referral:   Financial Counselor  Discharge planning Services  CM Consult   Status of Service:  In process, will continue to follow  Sherrilyn Rist [phone] 02/12/2017, 10:26 AM
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
## 2017-02-12 NOTE — Progress Notes (Signed)
  Echocardiogram 2D Echocardiogram has been performed.  Junius Faucett G Kaylanni Ezelle 02/12/2017, 1:26 PM

## 2017-02-12 NOTE — Progress Notes (Signed)
Pt states he has not taken his coreg in three months due to no primary care MD  Will inform case manager pt needs PCP
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
## 2017-02-12 NOTE — ED Notes (Signed)
ED Provider at bedside. 

## 2017-02-12 NOTE — Progress Notes (Signed)
[place] for heparin Indication: chest pain/ACS  Allergies  Allergen Reactions  . Lisinopril Anaphylaxis    Whole right side of face swelled.     Patient Measurements: Height: 5\' 9"  (175.3 cm) Weight: 263 lb 14.4 oz (119.7 kg) IBW/kg (Calculated) : 70.7 Heparin Dosing Weight: 95kg  Assessment: 46yo male c/o SOB, CP, and worsening edema continues on heparin for chest pain. No bleeding noted. Heparin level therapeutic after rate increase.  Goal of Therapy:  Heparin level 0.3-0.7 units/ml Monitor platelets by anticoagulation protocol: Yes   Plan:  Continue heparin gtt at 1600 units / hr Monitor daily heparin level, CBC, s/s of bleed  Elenor Quinones, PharmD, [facility] Clinical Pharmacist Pager [phone] 02/12/2017 9:56 PM

## 2017-02-12 NOTE — Progress Notes (Signed)
[place] for heparin Indication: chest pain/ACS  Allergies  Allergen Reactions  . Lisinopril Anaphylaxis    Whole right side of face swelled.     Patient Measurements: Height: 5\' 9"  (175.3 cm) Weight: 263 lb 14.4 oz (119.7 kg) IBW/kg (Calculated) : 70.7 Heparin Dosing Weight: 95kg  Vital Signs: Temp: 97.7 F (36.5 C) (10/08 0848) Temp Source: Oral (10/08 0848) BP: 167/121 (10/08 0848) Pulse Rate: 92 (10/08 0848)  Labs:  Recent Labs  02/11/17 2205 02/12/17 0319 02/12/17 0921 02/12/17 1217  HGB 15.3  --   --   --   HCT 45.7  --   --   --   PLT 214  --   --   --   HEPARINUNFRC  --   --   --  0.21*  CREATININE 2.08*  --   --   --   TROPONINI  --  0.13* 0.13*  --     Estimated Creatinine Clearance: 56.7 mL/min (A) (by C-G formula based on SCr of 2.08 mg/dL (H)).    Assessment: 46yo male c/o SOB, CP, and worsening edema continues on heparin for chest pain Initial heparin level = 0.21  No bleeding noted    Goal of Therapy:  Heparin level 0.3-0.7 units/ml Monitor platelets by anticoagulation protocol: Yes   Plan:  Increase heparin to 1600 units / hr 6 hour heparin level  Thank you Anette Guarneri, PharmD [phone] 02/12/2017,1:32 PM
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
## 2017-02-12 NOTE — ED Notes (Signed)
Attempted to call report

## 2017-02-13 ENCOUNTER — Other Ambulatory Visit: Payer: Self-pay

## 2017-02-13 ENCOUNTER — Telehealth: Payer: Self-pay

## 2017-02-13 DIAGNOSIS — I10 Essential (primary) hypertension: Secondary | ICD-10-CM

## 2017-02-13 DIAGNOSIS — E669 Obesity, unspecified: Secondary | ICD-10-CM

## 2017-02-13 DIAGNOSIS — Z720 Tobacco use: Secondary | ICD-10-CM

## 2017-02-13 LAB — CBC
HEMATOCRIT: 45.2 % (ref 39.0–52.0)
Hemoglobin: 14.9 g/dL (ref 13.0–17.0)
MCH: 30.2 pg (ref 26.0–34.0)
MCHC: 33 g/dL (ref 30.0–36.0)
MCV: 91.5 fL (ref 78.0–100.0)
Platelets: 214 10*3/uL (ref 150–400)
RBC: 4.94 MIL/uL (ref 4.22–5.81)
RDW: 16.1 % — ABNORMAL HIGH (ref 11.5–15.5)
WBC: 6.1 10*3/uL (ref 4.0–10.5)

## 2017-02-13 LAB — GLUCOSE, CAPILLARY
GLUCOSE-CAPILLARY: 99 mg/dL (ref 65–99)
GLUCOSE-CAPILLARY: 102 mg/dL — AB (ref 65–99)
GLUCOSE-CAPILLARY: 155 mg/dL — AB (ref 65–99)
Glucose-Capillary: 134 mg/dL — ABNORMAL HIGH (ref 65–99)

## 2017-02-13 LAB — MAGNESIUM: Magnesium: 2 mg/dL (ref 1.7–2.4)

## 2017-02-13 LAB — BASIC METABOLIC PANEL
ANION GAP: 11 (ref 5–15)
BUN: 19 mg/dL (ref 6–20)
CALCIUM: 8.7 mg/dL — AB (ref 8.9–10.3)
CHLORIDE: 101 mmol/L (ref 101–111)
CO2: 25 mmol/L (ref 22–32)
Creatinine, Ser: 2.32 mg/dL — ABNORMAL HIGH (ref 0.61–1.24)
GFR calc non Af Amer: 32 mL/min — ABNORMAL LOW (ref >60)
GFR, EST AFRICAN AMERICAN: 37 mL/min — AB (ref >60)
GLUCOSE: 112 mg/dL — AB (ref 65–99)
POTASSIUM: 3.6 mmol/L (ref 3.5–5.1)
Sodium: 137 mmol/L (ref 135–145)

## 2017-02-13 LAB — HEPARIN LEVEL (UNFRACTIONATED): Heparin Unfractionated: 0.44 IU/mL (ref 0.30–0.70)

## 2017-02-13 MED ORDER — POTASSIUM CHLORIDE CRYS ER 20 MEQ PO TBCR
40.0000 meq | EXTENDED_RELEASE_TABLET | Freq: Once | ORAL | Status: AC
  Administered 2017-02-13: 40 meq via ORAL
  Filled 2017-02-13: qty 2

## 2017-02-13 MED ORDER — ENOXAPARIN SODIUM 40 MG/0.4ML ~~LOC~~ SOLN
40.0000 mg | SUBCUTANEOUS | Status: DC
  Administered 2017-02-14 – 2017-02-15 (×2): 40 mg via SUBCUTANEOUS
  Filled 2017-02-13 (×2): qty 0.4

## 2017-02-13 MED ORDER — FUROSEMIDE 10 MG/ML IJ SOLN
80.0000 mg | Freq: Three times a day (TID) | INTRAMUSCULAR | Status: DC
  Administered 2017-02-13 – 2017-02-14 (×3): 80 mg via INTRAVENOUS
  Filled 2017-02-13 (×4): qty 8

## 2017-02-13 MED ORDER — ISOSORBIDE MONONITRATE ER 60 MG PO TB24
60.0000 mg | ORAL_TABLET | Freq: Every day | ORAL | Status: DC
  Administered 2017-02-13 – 2017-02-15 (×3): 60 mg via ORAL
  Filled 2017-02-13 (×3): qty 1

## 2017-02-13 MED ORDER — FUROSEMIDE 10 MG/ML IJ SOLN: 80.0000 mg | Freq: Three times a day (TID) | INTRAMUSCULAR | Status: DC

## 2017-02-13 MED ORDER — SPIRONOLACTONE 25 MG PO TABS
12.5000 mg | ORAL_TABLET | Freq: Every day | ORAL | Status: DC
Start: 2017-02-13 — End: 2017-02-15
  Administered 2017-02-13 – 2017-02-15 (×3): 12.5 mg via ORAL
  Filled 2017-02-13 (×3): qty 1

## 2017-02-13 MED ORDER — METOLAZONE 2.5 MG PO TABS
2.5000 mg | ORAL_TABLET | Freq: Once | ORAL | Status: AC
  Administered 2017-02-13: 2.5 mg via ORAL
  Filled 2017-02-13: qty 1
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
## 2017-02-13 NOTE — Telephone Encounter (Signed)
Met with the patient today to discuss medical follow up after discharge and to explain the [facility] at [facility] as well as the services provided at the clinic. He explained that he has not followed up with a PCP in the [place] area and goes back to [place] to see his provider there if he needs medication that he is not able to get in [place].  He said spoke about his frustration with receiving a list of medications from his provider that his supposed to take yet he is not given prescriptions for all of the medications. He noted carvedilol and gabapentin in particular.  When asked about is ability to afford his medications he noted that he still has medicaid from [place] and that pays for all of his prescriptions. When asked about applying for medicaid in [place], he became upset and changed his mind about scheduling an appointment at [facility], to which he was initially agreeable.  This CM attempted to explained the financial counseling and pharmacy services offered at [facility] as well as the role of the PCP to review his medications and he did not want to discuss any further. He became more irritated with this CM and did not confirm if he wanted this CM to contact him in the future about follow up at [facility].  He was given a card with this CM contact # and he can call if he decides he wishes to schedule a follow up appointment.   Voicemail message left with update for Olga Coaster, RN CM
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
## 2017-02-14 DIAGNOSIS — E785 Hyperlipidemia, unspecified: Secondary | ICD-10-CM

## 2017-02-14 LAB — CBC
HEMATOCRIT: 43.4 % (ref 39.0–52.0)
Hemoglobin: 14.4 g/dL (ref 13.0–17.0)
MCH: 30.1 pg (ref 26.0–34.0)
MCHC: 33.2 g/dL (ref 30.0–36.0)
MCV: 90.6 fL (ref 78.0–100.0)
PLATELETS: 231 10*3/uL (ref 150–400)
RBC: 4.79 MIL/uL (ref 4.22–5.81)
RDW: 16.2 % — ABNORMAL HIGH (ref 11.5–15.5)
WBC: 5.6 10*3/uL (ref 4.0–10.5)

## 2017-02-14 LAB — GLUCOSE, CAPILLARY
Glucose-Capillary: 91 mg/dL (ref 65–99)
Glucose-Capillary: 110 mg/dL — ABNORMAL HIGH (ref 65–99)
Glucose-Capillary: 131 mg/dL — ABNORMAL HIGH (ref 65–99)
Glucose-Capillary: 147 mg/dL — ABNORMAL HIGH (ref 65–99)

## 2017-02-14 LAB — BASIC METABOLIC PANEL
Anion gap: 12 (ref 5–15)
BUN: 21 mg/dL — AB (ref 6–20)
CHLORIDE: 94 mmol/L — AB (ref 101–111)
CO2: 28 mmol/L (ref 22–32)
CREATININE: 2.42 mg/dL — AB (ref 0.61–1.24)
Calcium: 8.7 mg/dL — ABNORMAL LOW (ref 8.9–10.3)
GFR calc non Af Amer: 30 mL/min — ABNORMAL LOW (ref >60)
GFR, EST AFRICAN AMERICAN: 35 mL/min — AB (ref >60)
Glucose, Bld: 139 mg/dL — ABNORMAL HIGH (ref 65–99)
Potassium: 2.9 mmol/L — ABNORMAL LOW (ref 3.5–5.1)
Sodium: 134 mmol/L — ABNORMAL LOW (ref 135–145)

## 2017-02-14 MED ORDER — FUROSEMIDE 10 MG/ML IJ SOLN: 80.0000 mg | Freq: Two times a day (BID) | INTRAMUSCULAR | Status: DC

## 2017-02-14 MED ORDER — POTASSIUM CHLORIDE CRYS ER 20 MEQ PO TBCR
40.0000 meq | EXTENDED_RELEASE_TABLET | Freq: Once | ORAL | Status: AC
  Administered 2017-02-14: 40 meq via ORAL
  Filled 2017-02-14: qty 2

## 2017-02-14 MED ORDER — POTASSIUM CHLORIDE CRYS ER 20 MEQ PO TBCR
80.0000 meq | EXTENDED_RELEASE_TABLET | Freq: Once | ORAL | Status: AC
  Administered 2017-02-14: 80 meq via ORAL
  Filled 2017-02-14: qty 4

## 2017-02-14 MED ORDER — FUROSEMIDE 10 MG/ML IJ SOLN
80.0000 mg | Freq: Once | INTRAMUSCULAR | Status: AC
  Administered 2017-02-14: 80 mg via INTRAVENOUS
  Filled 2017-02-14: qty 8

## 2017-02-14 NOTE — Progress Notes (Signed)
Patient ID: Scott Salinas, male   DOB: [birth year], 46 y.o.   MRN: [id number]  PROGRESS NOTE    Brooks Kinnan  IPJ:[id number] DOB: [birth year] DOA: 02/12/2017 PCP: Patient, No Pcp Per   Brief Narrative:  46 year old male with history of chronic systolic heart failure with ejection fraction of 40-45%, obstructive sleep apnea not on CPAP, stage III chronic kidney disease and CAD presented on 02/12/2017 with shortness of breath and chest pain along with worsening leg edema. He was admitted with acute decompensated heart failure along with slightly elevated troponin. Cardiology was consulted. He is currently on intravenous Lasix. EF was found to be 20-25%.  Assessment & Plan:   Principal Problem:   Acute on chronic combined systolic and diastolic CHF (congestive heart failure) (HCC) Active Problems:   Accelerated hypertension   Coronary artery disease due to lipid rich plaque   CKD (chronic kidney disease) stage 3, GFR 30-59 ml/min (HCC)   S/P CABG x 4   Angiotensin converting enzyme inhibitor-aggravated angioedema   Prolonged Q-T interval on ECG   CAD (coronary artery disease)   HLD (hyperlipidemia)   Tobacco abuse   Obesity (BMI 30-39.9)   Acute on chronic combined systolic and diastolic CHF  - Cardiology following. Continue IV Lasix. Continue hydralazine, Coreg, Imdur and spironolactone. - Monitor strict input and output.  negative fluid balance of 7550 ml over the last 24 hours; net negative fluid balance of >10L since admission. Daily weights. Fluid restriction - EF now noted at 20-25 percent along with diastolic dysfunction as well as noted to have moderate tricuspid regurg.    Accelerated hypertension: - Blood pressure improving. Continue Lasix, hydralazine, Coreg, Imdur, spironolactone   Coronary artery disease  status post CABG with positive troponin on presentation - Continue aspirin and statin as well as other medications as above -Elevated troponin probably secondary  to CHF exacerbation   CKD (chronic kidney disease) stage 3:  - creatinine 2.42 today far.Continue to monitor closely with diuresis. Slightly trending upward.     Prolonged Q-T interval on ECG:  - QTc 500 on admission. Repeat EKG in the morning    HLD (hyperlipidemia): - Continue statin     Tobacco abuse: Counseled. Declined nicotine patch  Obesity (BMI 30-39.9):  - Outpatient follow-up  New diagnosis of diabetes mellitus type 2, without long-term use of insulin, uncontrolled:  - Continue sliding scale insulin. Outpatient follow-up   Hypokalemia: Secondary to diuresis, we'll replace. Repeat a.m. labs   DVT prophylaxis: Heparin Code Status:  Full Family Communication: None at bedside Disposition Plan: Home in 2-3 days  Consultants: Cardiology  Procedures: Echo on 02/12/2017: EF of 73-41%, diastolic dysfunction and moderate tricuspid regurgitation  Antimicrobials: None    Subjective: Patient seen and examined at bedside. She still complains of some shortness of breath with minimal exertion. No current chest pain. No overnight fever or vomiting.  Objective: Vitals:   02/13/17 1122 02/13/17 2007 02/14/17 0323 02/14/17 0412  BP: (!) 125/98 110/77  122/82  Pulse: 71 65  68  Resp: 18 18  18   Temp: 97.6 F (36.4 C) (!) 97.5 F (36.4 C)  97.6 F (36.4 C)  TempSrc: Oral Oral  Oral  SpO2: 100% 100%  100%  Weight:   113.9 kg (251 lb 1.6 oz)   Height:        Intake/Output Summary (Last 24 hours) at 02/14/17 0934 Last data filed at 02/14/17 0853  Gross per 24 hour  Intake  720 ml  Output             8000 ml  Net            -7280 ml   Filed Weights   02/12/17 0902 02/13/17 0542 02/14/17 0323  Weight: 119.7 kg (263 lb 14.4 oz) 118.5 kg (261 lb 4.8 oz) 113.9 kg (251 lb 1.6 oz)    Examination:  General exam: Appears calm and comfortable  Respiratory system: Bilateral decreased breath sound at basesWith basilar crackles Cardiovascular system: S1 & S2  heard, rate controlled  Gastrointestinal system: Abdomen is nondistended, soft and nontender. Normal bowel sounds heard. Extremities: No cyanosis, clubbing; 1-2+ pitting pedal edema     Data Reviewed: I have personally reviewed following labs and imaging studies  CBC:  Recent Labs Lab 02/11/17 2205 02/13/17 0548 02/14/17 0542  WBC 6.2 6.1 5.6  HGB 15.3 14.9 14.4  HCT 45.7 45.2 43.4  MCV 90.9 91.5 90.6  PLT 214 214 196   Basic Metabolic Panel:  Recent Labs Lab 02/11/17 2205 02/12/17 0319 02/12/17 1356 02/13/17 0548 02/14/17 0542  NA 132*  --  139 137 134*  K 3.0*  --  3.6 3.6 2.9*  CL 99*  --  102 101 94*  CO2 22  --  28 25 28   GLUCOSE 130*  --  111* 112* 139*  BUN 18  --  16 19 21*  CREATININE 2.08*  --  2.14* 2.32* 2.42*  CALCIUM 8.3*  --  8.6* 8.7* 8.7*  MG  --  2.1  --  2.0  --    GFR: Estimated Creatinine Clearance: 47.5 mL/min (A) (by C-G formula based on SCr of 2.42 mg/dL (H)). Liver Function Tests: No results for input(s): AST, ALT, ALKPHOS, BILITOT, PROT, ALBUMIN in the last 168 hours. No results for input(s): LIPASE, AMYLASE in the last 168 hours. No results for input(s): AMMONIA in the last 168 hours. Coagulation Profile: No results for input(s): INR, PROTIME in the last 168 hours. Cardiac Enzymes:  Recent Labs Lab 02/12/17 0319 02/12/17 0921 02/12/17 1439  TROPONINI 0.13* 0.13* 0.13*   BNP (last 3 results)  Recent Labs  07/14/16 1011  PROBNP 1,532*   HbA1C:  Recent Labs  02/12/17 0319  HGBA1C 6.8*   CBG:  Recent Labs Lab 02/13/17 0734 02/13/17 1115 02/13/17 1628 02/13/17 2132 02/14/17 0740  GLUCAP 134* 99 102* 155* 91   Lipid Profile:  Recent Labs  02/12/17 0319  CHOL 170  HDL 34*  LDLCALC 104*  TRIG 159*  CHOLHDL 5.0   Thyroid Function Tests: No results for input(s): TSH, T4TOTAL, FREET4, T3FREE, THYROIDAB in the last 72 hours. Anemia Panel: No results for input(s): VITAMINB12, FOLATE, FERRITIN, TIBC, IRON,  RETICCTPCT in the last 72 hours. Sepsis Labs: No results for input(s): PROCALCITON, LATICACIDVEN in the last 168 hours.  No results found for this or any previous visit (from the past 240 hour(s)).       Radiology Studies: No results found.      Scheduled Meds: . amLODipine  10 mg Oral Daily  . aspirin EC  81 mg Oral Daily  . atorvastatin  40 mg Oral q1800  . carvedilol  12.5 mg Oral BID WC  . enoxaparin (LOVENOX) injection  40 mg Subcutaneous Q24H  . furosemide  80 mg Intravenous TID  . hydrALAZINE  50 mg Oral TID  . Influenza vac split quadrivalent PF  0.5 mL Intramuscular Tomorrow-1000  . insulin aspart  0-5 Units Subcutaneous QHS  .  insulin aspart  0-9 Units Subcutaneous TID WC  . isosorbide mononitrate  60 mg Oral Daily  . nicotine  21 mg Transdermal Daily  . potassium chloride  40 mEq Oral Once  . sodium chloride flush  3 mL Intravenous Q12H  . spironolactone  12.5 mg Oral Daily   Continuous Infusions: . sodium chloride       LOS: 2 days        Aline August, MD [facility] Pager [phone]  If 7PM-7AM, please contact night-coverage [email or web address] Password [email or web address] 02/14/2017, 9:34 AM

## 2017-02-14 NOTE — Progress Notes (Signed)
Patient has been referred to HF [facility].  I will send appropriate paperwork via secure email to the Paramedic team.
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
## 2017-02-14 NOTE — Progress Notes (Signed)
I spoke with Scott Salinas and he is agreeable to [name].  I will send referral.

## 2017-02-15 ENCOUNTER — Telehealth (HOSPITAL_COMMUNITY): Payer: Self-pay

## 2017-02-15 ENCOUNTER — Other Ambulatory Visit: Payer: Self-pay

## 2017-02-15 LAB — BASIC METABOLIC PANEL
ANION GAP: 10 (ref 5–15)
BUN: 22 mg/dL — AB (ref 6–20)
CALCIUM: 8.8 mg/dL — AB (ref 8.9–10.3)
CO2: 30 mmol/L (ref 22–32)
Chloride: 95 mmol/L — ABNORMAL LOW (ref 101–111)
Creatinine, Ser: 2.34 mg/dL — ABNORMAL HIGH (ref 0.61–1.24)
GFR calc non Af Amer: 32 mL/min — ABNORMAL LOW (ref >60)
GFR, EST AFRICAN AMERICAN: 37 mL/min — AB (ref >60)
GLUCOSE: 94 mg/dL (ref 65–99)
Potassium: 3.4 mmol/L — ABNORMAL LOW (ref 3.5–5.1)
SODIUM: 135 mmol/L (ref 135–145)

## 2017-02-15 LAB — GLUCOSE, CAPILLARY
GLUCOSE-CAPILLARY: 123 mg/dL — AB (ref 65–99)
Glucose-Capillary: 111 mg/dL — ABNORMAL HIGH (ref 65–99)

## 2017-02-15 LAB — CBC
HEMATOCRIT: 48.7 % (ref 39.0–52.0)
HEMOGLOBIN: 16.2 g/dL (ref 13.0–17.0)
MCH: 30.2 pg (ref 26.0–34.0)
MCHC: 33.3 g/dL (ref 30.0–36.0)
MCV: 90.9 fL (ref 78.0–100.0)
Platelets: 248 10*3/uL (ref 150–400)
RBC: 5.36 MIL/uL (ref 4.22–5.81)
RDW: 15.9 % — ABNORMAL HIGH (ref 11.5–15.5)
WBC: 6.2 10*3/uL (ref 4.0–10.5)

## 2017-02-15 LAB — MAGNESIUM: MAGNESIUM: 2 mg/dL (ref 1.7–2.4)

## 2017-02-15 MED ORDER — TORSEMIDE 20 MG PO TABS
40.0000 mg | ORAL_TABLET | Freq: Every day | ORAL | 0 refills | Status: DC
Start: 2017-02-16 — End: 2017-02-16

## 2017-02-15 MED ORDER — SPIRONOLACTONE 25 MG PO TABS: 12.5000 mg | ORAL_TABLET | Freq: Every day | ORAL | 0 refills | Status: DC

## 2017-02-15 MED ORDER — POTASSIUM CHLORIDE CRYS ER 20 MEQ PO TBCR
40.0000 meq | EXTENDED_RELEASE_TABLET | Freq: Four times a day (QID) | ORAL | Status: AC
  Administered 2017-02-15 (×2): 40 meq via ORAL
  Filled 2017-02-15 (×2): qty 2

## 2017-02-15 MED ORDER — CARVEDILOL 12.5 MG PO TABS: 12.5000 mg | ORAL_TABLET | Freq: Two times a day (BID) | ORAL | 0 refills | Status: DC

## 2017-02-15 MED ORDER — TORSEMIDE 20 MG PO TABS: 40.0000 mg | ORAL_TABLET | Freq: Every day | ORAL | Status: DC

## 2017-02-15 MED ORDER — POTASSIUM CHLORIDE CRYS ER 20 MEQ PO TBCR: 40.0000 meq | EXTENDED_RELEASE_TABLET | Freq: Every day | ORAL | 0 refills | Status: DC

## 2017-02-15 MED ORDER — TORSEMIDE 20 MG PO TABS: 20.0000 mg | ORAL_TABLET | Freq: Every day | ORAL | Status: DC

## 2017-02-15 MED ORDER — ISOSORBIDE MONONITRATE ER 60 MG PO TB24: 60.0000 mg | ORAL_TABLET | Freq: Every day | ORAL | 0 refills | Status: DC

## 2017-02-15 MED ORDER — HYDRALAZINE HCL 50 MG PO TABS: 50.0000 mg | ORAL_TABLET | Freq: Three times a day (TID) | ORAL | 0 refills | Status: DC

## 2017-02-15 NOTE — Discharge Summary (Signed)
Physician Discharge Summary  Scott Salinas [facility]:[id number] DOB: 31-Jul-1970 DOA: 02/12/2017  PCP: Patient, No Pcp Per  Admit date: 02/12/2017 Discharge date: 02/15/2017  Admitted From: Home Disposition:  Home  Recommendations for Outpatient Follow-up:  1. Follow up with cardiology/heart failure in 1 week with repeat BMP 2. Follow-up with primary care provider in a week  Home Health: no  Equipment/Devices: none  Discharge Condition: stable  CODE STATUS: Full Diet recommendation: Heart Healthy / Carb Modified  Brief/Interim Summary: 46 year old male with history of chronic systolic heart failure with ejection fraction of 40-45%, obstructive sleep apnea not on CPAP, stage III chronic kidney disease and CAD presented on 02/12/2017 with shortness of breath and chest pain along with worsening leg edema. He was admitted with acute decompensated heart failure along with slightly elevated troponin. Cardiology was consulted. EF was found to be 20-25%. He was started on IV Lasix. He has diuresed significantly and cardiology has cleared the patient for discharge.   Discharge Diagnoses:  Principal Problem:   Acute on chronic combined systolic and diastolic CHF (congestive heart failure) (HCC) Active Problems:   Accelerated hypertension   Coronary artery disease due to lipid rich plaque   CKD (chronic kidney disease) stage 3, GFR 30-59 ml/min (HCC)   S/P CABG x 4   Angiotensin converting enzyme inhibitor-aggravated angioedema   Prolonged Q-T interval on ECG   CAD (coronary artery disease)   HLD (hyperlipidemia)   Tobacco abuse   Obesity (BMI 30-39.9)  Acute on chronic combined systolic and diastolic CHF  - Cardiology following. Currently on IV Lasix.  - negative fluid balance of 4605 ml over the last 24 hours; net negative fluid balance of >14L since admission.  - EF now noted at 20-25 percent along with diastolic dysfunction as well as noted to have moderate tricuspid regurg.  -  cardiology has cleared the patient for discharge on oral torsemide, hydralazine, Coreg, Imdur and spironolactone, - Outpatient follow-up with cardiology/heart failure clinic with repeat  BMP  Accelerated hypertension: - Blood pressure improved. Continue Lasix, hydralazine, Coreg, Imdur, spironolactone  Coronary artery disease  status post CABG with positive troponin on presentation - Continue aspirin and statin as well as other medications as above -Elevated troponin probably secondary to CHF exacerbation  CKD (chronic kidney disease) stage 3:  - creatinine 2.34 today. Outpatient follow-up  Prolonged Q-T interval on ECG:  - QTc 500 on admission. Follow-up with cardiology  HLD (hyperlipidemia): - Continue statin   Tobacco abuse: Counseled. Declined nicotine patch  Obesity (BMI 30-39.9):  - Outpatient follow-up  New diagnosis of diabetes mellitus type 2, without long-term use of insulin, uncontrolled:  -  Outpatient follow-up   Hypokalemia: Secondary to diuresis - continue K Dur at home. Follow-up BMP as an outpatient  Discharge Instructions  Discharge Instructions    (HEART FAILURE PATIENTS) Call MD:  Anytime you have any of the following symptoms: 1) 3 pound weight gain in 24 hours or 5 pounds in 1 week 2) shortness of breath, with or without a dry hacking cough 3) swelling in the hands, feet or stomach 4) if you have to sleep on extra pillows at night in order to breathe.    Complete by:  As directed    Call MD for:  difficulty breathing, headache or visual disturbances    Complete by:  As directed    Call MD for:  extreme fatigue    Complete by:  As directed    Call MD for:  hives  Complete by:  As directed    Call MD for:  persistant dizziness or light-headedness    Complete by:  As directed    Call MD for:  persistant nausea and vomiting    Complete by:  As directed    Call MD for:  severe uncontrolled pain    Complete by:  As directed    Call MD  for:  temperature >100.4    Complete by:  As directed    Diet - low sodium heart healthy    Complete by:  As directed    Diet Carb Modified    Complete by:  As directed    Increase activity slowly    Complete by:  As directed      Allergies as of 02/15/2017      Reactions   Lisinopril Anaphylaxis   Whole right side of face swelled.       Medication List    STOP taking these medications   acetaminophen 325 MG tablet Commonly known as:  TYLENOL   furosemide 20 MG tablet Commonly known as:  LASIX     TAKE these medications   amLODipine 10 MG tablet Commonly known as:  NORVASC Take 1 tablet (10 mg total) by mouth daily.   aspirin 81 MG EC tablet Take 1 tablet (81 mg total) by mouth daily.   atorvastatin 40 MG tablet Commonly known as:  LIPITOR Take 1 tablet (40 mg total) by mouth daily at 6 PM.   carvedilol 12.5 MG tablet Commonly known as:  COREG Take 1 tablet (12.5 mg total) by mouth 2 (two) times daily with a meal. What changed:  medication strength  how much to take   hydrALAZINE 50 MG tablet Commonly known as:  APRESOLINE Take 1 tablet (50 mg total) by mouth 3 (three) times daily. What changed:  medication strength  how much to take   isosorbide mononitrate 60 MG 24 hr tablet Commonly known as:  IMDUR Take 1 tablet (60 mg total) by mouth daily. What changed:  medication strength  how much to take   nitroGLYCERIN 0.4 MG SL tablet Commonly known as:  NITROSTAT Place 1 tablet (0.4 mg total) under the tongue every 5 (five) minutes as needed for chest pain.   potassium chloride SA 20 MEQ tablet Commonly known as:  K-DUR,KLOR-CON Take 2 tablets (40 mEq total) by mouth daily. Take with your Lasix. What changed:  how much to take  when to take this   spironolactone 25 MG tablet Commonly known as:  ALDACTONE Take 0.5 tablets (12.5 mg total) by mouth daily.   torsemide 20 MG tablet Commonly known as:  DEMADEX Take 2 tablets (40 mg total) by  mouth daily. What changed:  how much to take  when to take this      Follow-up Information    Larey Dresser, MD Follow up on 02/22/2017.   Specialty:  Cardiology Why:  at 10:30 Garage Code 8000 Contact information: [place] [address] [phone]          Allergies  Allergen Reactions  . Lisinopril Anaphylaxis    Whole right side of face swelled.     Consultations:  Cardiology   Procedures/Studies: Dg Chest 2 View  Result Date: 02/11/2017 CLINICAL DATA:  Acute onset of left-sided chest pain and shortness of breath. Bilateral leg and abdominal swelling. Initial encounter. EXAM: CHEST  2 VIEW COMPARISON:  Chest radiograph performed 05/27/2016 FINDINGS: The lungs are well-aerated. Peribronchial  thickening is noted. Vascular congestion is noted. There is no evidence of focal opacification, pleural effusion or pneumothorax. The heart is mildly enlarged. The patient is status post median sternotomy. No acute osseous abnormalities are seen. IMPRESSION: Vascular congestion and mild cardiomegaly. Peribronchial thickening noted. Electronically Signed   By: Garald Balding M.D.   On: 02/11/2017 22:40    Echo on 02/12/2017: EF of 16-60%, diastolic dysfunction and moderate tricuspid regurgitation   Subjective: Patient seen and examined at bedside. He denies any overnight fever, nausea or vomiting.  Discharge Exam: Vitals:   02/15/17 1201 02/15/17 1244  BP:  (!) 140/100  Pulse:    Resp:    Temp: 98 F (36.7 C)   SpO2: 100%    Vitals:   02/15/17 0406 02/15/17 0933 02/15/17 1201 02/15/17 1244  BP: 136/82 117/86  (!) 140/100  Pulse: 66 73    Resp: 18     Temp: 97.9 F (36.6 C)  98 F (36.7 C)   TempSrc: Oral  Oral   SpO2: 100% 100% 100%   Weight: 110.9 kg (244 lb 9.6 oz)     Height:        General: Pt is alert, awake, not in acute distress Cardiovascular: rate controlled, S1/S2 +, no rubs, no gallops Respiratory: bilateral  decreased breath sounds at bases with some basilar crackles Abdominal: Soft, NT, ND, bowel sounds + Extremities: 1 + edema, no cyanosis    The results of significant diagnostics from this hospitalization (including imaging, microbiology, ancillary and laboratory) are listed below for reference.     Microbiology: No results found for this or any previous visit (from the past 240 hour(s)).   Labs: BNP (last 3 results)  Recent Labs  03/02/16 2359 05/27/16 2308 02/11/17 2205  BNP 930.4* 1,016.5* 6,301.6*   Basic Metabolic Panel:  Recent Labs Lab 02/11/17 2205 02/12/17 0319 02/12/17 1356 02/13/17 0548 02/14/17 0542 02/15/17 0315  NA 132*  --  139 137 134* 135  K 3.0*  --  3.6 3.6 2.9* 3.4*  CL 99*  --  102 101 94* 95*  CO2 22  --  28 25 28 30   GLUCOSE 130*  --  111* 112* 139* 94  BUN 18  --  16 19 21* 22*  CREATININE 2.08*  --  2.14* 2.32* 2.42* 2.34*  CALCIUM 8.3*  --  8.6* 8.7* 8.7* 8.8*  MG  --  2.1  --  2.0  --  2.0   Liver Function Tests: No results for input(s): AST, ALT, ALKPHOS, BILITOT, PROT, ALBUMIN in the last 168 hours. No results for input(s): LIPASE, AMYLASE in the last 168 hours. No results for input(s): AMMONIA in the last 168 hours. CBC:  Recent Labs Lab 02/11/17 2205 02/13/17 0548 02/14/17 0542 02/15/17 0315  WBC 6.2 6.1 5.6 6.2  HGB 15.3 14.9 14.4 16.2  HCT 45.7 45.2 43.4 48.7  MCV 90.9 91.5 90.6 90.9  PLT 214 214 231 248   Cardiac Enzymes:  Recent Labs Lab 02/12/17 0319 02/12/17 0921 02/12/17 1439  TROPONINI 0.13* 0.13* 0.13*   BNP: Invalid input(s): POCBNP CBG:  Recent Labs Lab 02/14/17 1111 02/14/17 1641 02/14/17 2126 02/15/17 0741 02/15/17 1157  GLUCAP 110* 131* 147* 111* 123*   D-Dimer No results for input(s): DDIMER in the last 72 hours. Hgb A1c No results for input(s): HGBA1C in the last 72 hours. Lipid Profile No results for input(s): CHOL, HDL, LDLCALC, TRIG, CHOLHDL, LDLDIRECT in the last 72 hours. Thyroid  function studies No  results for input(s): TSH, T4TOTAL, T3FREE, THYROIDAB in the last 72 hours.  Invalid input(s): FREET3 Anemia work up No results for input(s): VITAMINB12, FOLATE, FERRITIN, TIBC, IRON, RETICCTPCT in the last 72 hours. Urinalysis    Component Value Date/Time   COLORURINE YELLOW 01/19/2015 0134   APPEARANCEUR CLEAR 01/19/2015 0134   LABSPEC 1.006 01/19/2015 0134   PHURINE 5.5 01/19/2015 0134   GLUCOSEU NEGATIVE 01/19/2015 0134   HGBUR NEGATIVE 01/19/2015 0134   BILIRUBINUR NEGATIVE 01/19/2015 0134   KETONESUR NEGATIVE 01/19/2015 0134   PROTEINUR NEGATIVE 01/19/2015 0134   UROBILINOGEN 0.2 01/19/2015 0134   NITRITE NEGATIVE 01/19/2015 0134   LEUKOCYTESUR NEGATIVE 01/19/2015 0134   Sepsis Labs Invalid input(s): PROCALCITONIN,  WBC,  LACTICIDVEN Microbiology No results found for this or any previous visit (from the past 240 hour(s)).   Time coordinating discharge: 35 minutes  SIGNED:   Aline August, MD  [facility] 02/15/2017, 2:09 PM Pager: [phone]  If 7PM-7AM, please contact night-coverage [email or web address] Password [email or web address]

## 2017-02-15 NOTE — Progress Notes (Signed)
Discharge instructions reviewed with patient, questions answered, verbalized understanding.  Patient working with cardiac rehab at this time, awaiting ride for discharge.

## 2017-02-15 NOTE — Progress Notes (Signed)
Pt declined ambulation. I attempted to educate on HF, low sodium, smoking cessation, daily wts. Pt argumentative. I could not keep a clear conversation going. He became more frustrated. I left materials. [place], ACSM 3:37 PM 02/15/2017
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
## 2017-02-15 NOTE — Telephone Encounter (Signed)
Pt is a new referral that we received yesterday.  I called today to set up an initial appointment with pt.  The women that answered the phone stated that pt was still in the hospital.  I will f/u with him once he's discharged.

## 2017-02-16 MED ORDER — ISOSORBIDE MONONITRATE ER 60 MG PO TB24
60.0000 mg | ORAL_TABLET | Freq: Every day | ORAL | 0 refills | Status: DC
Start: 2017-02-16 — End: 2017-05-24

## 2017-02-16 MED ORDER — HYDRALAZINE HCL 50 MG PO TABS: 50.0000 mg | ORAL_TABLET | Freq: Three times a day (TID) | ORAL | 0 refills | Status: DC

## 2017-02-16 MED ORDER — POTASSIUM CHLORIDE CRYS ER 20 MEQ PO TBCR: 40.0000 meq | EXTENDED_RELEASE_TABLET | Freq: Every day | ORAL | 0 refills | Status: DC

## 2017-02-16 MED ORDER — SPIRONOLACTONE 25 MG PO TABS: 12.5000 mg | ORAL_TABLET | Freq: Every day | ORAL | 0 refills | Status: DC

## 2017-02-16 MED ORDER — TORSEMIDE 20 MG PO TABS
40.0000 mg | ORAL_TABLET | Freq: Every day | ORAL | 0 refills | Status: DC
Start: 2017-02-16 — End: 2017-02-22

## 2017-02-16 MED ORDER — CARVEDILOL 12.5 MG PO TABS: 12.5000 mg | ORAL_TABLET | Freq: Two times a day (BID) | ORAL | 0 refills | Status: DC

## 2017-02-22 ENCOUNTER — Encounter (HOSPITAL_COMMUNITY): Payer: Self-pay | Admitting: Cardiology

## 2017-02-22 ENCOUNTER — Other Ambulatory Visit (HOSPITAL_COMMUNITY): Payer: Self-pay

## 2017-02-22 ENCOUNTER — Ambulatory Visit (HOSPITAL_COMMUNITY)
Admit: 2017-02-22 | Discharge: 2017-02-22 | Disposition: A | Discharge status: Home / Self Care | Payer: Medicaid Other | Attending: Cardiology | Admitting: Cardiology

## 2017-02-22 VITALS — BP 148/88 | HR 79 | Wt 258.8 lb

## 2017-02-22 DIAGNOSIS — I2583 Coronary atherosclerosis due to lipid rich plaque: Secondary | ICD-10-CM

## 2017-02-22 DIAGNOSIS — N183 Chronic kidney disease, stage 3 unspecified: Secondary | ICD-10-CM

## 2017-02-22 DIAGNOSIS — T783XXA Angioneurotic edema, initial encounter: Secondary | ICD-10-CM | POA: Diagnosis not present

## 2017-02-22 DIAGNOSIS — F17210 Nicotine dependence, cigarettes, uncomplicated: Secondary | ICD-10-CM | POA: Diagnosis not present

## 2017-02-22 DIAGNOSIS — I2510 Atherosclerotic heart disease of native coronary artery without angina pectoris: Secondary | ICD-10-CM | POA: Diagnosis not present

## 2017-02-22 DIAGNOSIS — I2582 Chronic total occlusion of coronary artery: Secondary | ICD-10-CM | POA: Insufficient documentation

## 2017-02-22 DIAGNOSIS — Z79899 Other long term (current) drug therapy: Secondary | ICD-10-CM | POA: Insufficient documentation

## 2017-02-22 DIAGNOSIS — I130 Hypertensive heart and chronic kidney disease with heart failure and stage 1 through stage 4 chronic kidney disease, or unspecified chronic kidney disease: Secondary | ICD-10-CM | POA: Insufficient documentation

## 2017-02-22 DIAGNOSIS — E7849 Other hyperlipidemia: Secondary | ICD-10-CM

## 2017-02-22 DIAGNOSIS — I5022 Chronic systolic (congestive) heart failure: Secondary | ICD-10-CM

## 2017-02-22 DIAGNOSIS — Z7982 Long term (current) use of aspirin: Secondary | ICD-10-CM | POA: Insufficient documentation

## 2017-02-22 DIAGNOSIS — Z951 Presence of aortocoronary bypass graft: Secondary | ICD-10-CM | POA: Diagnosis not present

## 2017-02-22 DIAGNOSIS — I255 Ischemic cardiomyopathy: Secondary | ICD-10-CM | POA: Insufficient documentation

## 2017-02-22 LAB — BASIC METABOLIC PANEL
Anion gap: 8 (ref 5–15)
BUN: 21 mg/dL — AB (ref 6–20)
CALCIUM: 8.6 mg/dL — AB (ref 8.9–10.3)
CO2: 23 mmol/L (ref 22–32)
CREATININE: 1.98 mg/dL — AB (ref 0.61–1.24)
Chloride: 104 mmol/L (ref 101–111)
GFR calc Af Amer: 45 mL/min — ABNORMAL LOW (ref >60)
GFR, EST NON AFRICAN AMERICAN: 39 mL/min — AB (ref >60)
GLUCOSE: 110 mg/dL — AB (ref 65–99)
Potassium: 3.7 mmol/L (ref 3.5–5.1)
Sodium: 135 mmol/L (ref 135–145)

## 2017-02-22 LAB — LIPID PANEL
CHOL/HDL RATIO: 3.7 ratio
Cholesterol: 153 mg/dL (ref 0–200)
HDL: 41 mg/dL (ref >40)
LDL Cholesterol: 84 mg/dL (ref 0–99)
Triglycerides: 139 mg/dL (ref <150)
VLDL: 28 mg/dL (ref 0–40)

## 2017-02-22 MED ORDER — ATORVASTATIN CALCIUM 40 MG PO TABS: 40.0000 mg | ORAL_TABLET | Freq: Every day | ORAL | 6 refills | Status: DC

## 2017-02-22 MED ORDER — AMLODIPINE BESYLATE 10 MG PO TABS: 10.0000 mg | ORAL_TABLET | Freq: Every day | ORAL | 0 refills | Status: DC

## 2017-02-22 MED ORDER — AMLODIPINE BESYLATE 10 MG PO TABS: 10.0000 mg | ORAL_TABLET | Freq: Every day | ORAL | 3 refills | Status: DC

## 2017-02-22 MED ORDER — TORSEMIDE 20 MG PO TABS: 40.0000 mg | ORAL_TABLET | Freq: Two times a day (BID) | ORAL | 3 refills | Status: DC

## 2017-02-22 MED ORDER — ASPIRIN 81 MG PO TBEC: 81.0000 mg | DELAYED_RELEASE_TABLET | Freq: Every day | ORAL | 0 refills | Status: DC

## 2017-02-22 MED ORDER — DOCUSATE SODIUM 100 MG PO CAPS: 100.0000 mg | ORAL_CAPSULE | Freq: Two times a day (BID) | ORAL | 0 refills | Status: DC

## 2017-02-22 MED ORDER — POTASSIUM CHLORIDE CRYS ER 20 MEQ PO TBCR: EXTENDED_RELEASE_TABLET | ORAL | 3 refills | Status: DC

## 2017-02-22 NOTE — Progress Notes (Signed)
Paramedicine Encounter    Patient ID: Ariez Neilan, male    DOB: 24-Aug-1970, 46 y.o.   MRN: [id number]    Patient Care Team: Patient, No Pcp Per as PCP - General (General Practice)  Patient Active Problem List   Diagnosis Date Noted  . HLD (hyperlipidemia) 02/12/2017  . Tobacco abuse 02/12/2017  . Obesity (BMI 30-39.9) 02/12/2017  . CAD (coronary artery disease)   . Acute on chronic combined systolic and diastolic CHF (congestive heart failure) ([place]) 05/28/2016  . Prolonged Q-T interval on ECG 03/03/2016  . Right leg swelling 12/29/2015  . Drug abuse ([place]) 12/29/2015  . 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 Chronic narcotic use 07/26/2015  . Leg pain, right 07/26/2015  . 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 CKD (chronic kidney disease) stage 3, GFR 30-59 ml/min (HCC)   . Coronary artery disease due to lipid rich plaque   . Accelerated hypertension 12/27/2014  . History of noncompliance with medical treatment 12/27/2014    Current Outpatient Prescriptions:  .  amLODipine (NORVASC) 10 MG tablet, Take 1 tablet (10 mg total) by mouth daily. (Patient not taking: Reported on 02/22/2017), Disp: 30 tablet, Rfl: 0 .  aspirin EC 81 MG EC tablet, Take 1 tablet (81 mg total) by mouth daily. (Patient not taking: Reported on 02/22/2017), Disp: 30 tablet, Rfl: 0 .  atorvastatin (LIPITOR) 40 MG tablet, Take 1 tablet (40 mg total) by mouth daily at 6 PM. (Patient not taking: Reported on 02/22/2017), Disp: 30 tablet, Rfl: 0 .  carvedilol (COREG) 12.5 MG tablet, Take 1 tablet (12.5 mg total) by mouth 2 (two) times daily with a meal., Disp: 60 tablet, Rfl: 0 .  hydrALAZINE (APRESOLINE) 50 MG tablet, Take 1 tablet (50 mg total) by mouth 3 (three) times daily., Disp: 90 tablet, Rfl: 0 .  isosorbide mononitrate (IMDUR) 60 MG 24 hr tablet, Take 1 tablet (60 mg total) by mouth daily., Disp: 30 tablet, Rfl: 0 .  nitroGLYCERIN (NITROSTAT) 0.4 MG SL tablet, Place 1 tablet (0.4  mg total) under the tongue every 5 (five) minutes as needed for chest pain., Disp: 25 tablet, Rfl: 0 .  potassium chloride SA (K-DUR,KLOR-CON) 20 MEQ tablet, Take 2 tablets (40 mEq total) by mouth daily. Take with your Lasix., Disp: 60 tablet, Rfl: 0 .  spironolactone (ALDACTONE) 25 MG tablet, Take 0.5 tablets (12.5 mg total) by mouth daily., Disp: 30 tablet, Rfl: 0 .  torsemide (DEMADEX) 20 MG tablet, Take 2 tablets (40 mg total) by mouth daily., Disp: 60 tablet, Rfl: 0 Allergies  Allergen Reactions  . Lisinopril Anaphylaxis    Whole right side of face swelled.      Social History   Social History  . Marital status: Single    Spouse name: N/A  . Number of children: N/A  . Years of education: N/A   Occupational History  . Disabled    Social History Main Topics  . Smoking status: Current Every Day Smoker    Packs/day: 0.50    Years: 20.00    Types: Cigarettes  . Smokeless tobacco: Never Used  . Alcohol use No  . Drug use: Yes    Types: Marijuana     Comment: 12/30/2015 "have smoked it twice in last 3-4 months"  . Sexual activity: Not on file   Other Topics Concern  . Not on file   Social History Narrative   Pt lives with girlfriend    Physical Exam  Initial visit  CLINIC VISIT  with Dr. [name] Pt stated that he has gained about 13 lbs since he was discharged last week.  He stated that he drinks a lot of fluid throughout the day due to him trying to stay away from processed foods. He's trying to stop smoking but he still borrow cigarettes from people.  Pt admits to smoking marijuana and drinking alcohol occasionally. He reports Issues with constipation, he was advised to take colace daily.  Dr. [name] talked about the chip to monitor the amount of fluid he is retaining.  Dr. [name] will check to see if pt qualify for the program.  Pt sleeps with 2 pillows currently and he has some sob when he sleeping.    Pt agrees to the [facility] program.  He was given a scale during this  visit and a med bag.  Pt receives his meds from [facility] on [address].     **Rx changes today: Increase torsemide to 40 mg BID Increase in potassium Start back taking amolodapine and asa  Ahmad Vanwey, EMT-P

## 2017-02-22 NOTE — Patient Instructions (Addendum)
Increase Torsemide to 40 mg (2 tabs) Twice daily   Increase Potassium to 40 meq (2 tabs) in AM and 20 meq (1 tab) in PM  Restart Aspirin 81 mg daily YOU CAN GET THIS OVER THE COUNTER  Restart Amlodipine 10 mg daily  Restart Atorvastatin 40 mg daily  Start Colace (docusate) 100 mg Twice daily YOU CAN GET THIS OVER THE COUNTER  Labs today  Labs in 1 week  Your physician recommends that you schedule a follow-up appointment in: 3 weeks with Nurse Practitioner   Your physician recommends that you schedule a follow-up appointment in: 6 weeks with Dr [name]
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
## 2017-02-28 ENCOUNTER — Other Ambulatory Visit (HOSPITAL_COMMUNITY): Payer: Self-pay

## 2017-02-28 NOTE — Progress Notes (Signed)
Paramedicine Encounter    Patient ID: Scott Salinas, male    DOB: 1970-09-24, 46 y.o.   MRN: [id number]    Patient Care Team: Patient, No Pcp Per as PCP - General (General Practice)  Patient Active Problem List   Diagnosis Date Noted  . HLD (hyperlipidemia) 02/12/2017  . Tobacco abuse 02/12/2017  . Obesity (BMI 30-39.9) 02/12/2017  . CAD (coronary artery disease)   . Acute on chronic combined systolic and diastolic CHF (congestive heart failure) ([place]) 05/28/2016  . Prolonged Q-T interval on ECG 03/03/2016  . Right leg swelling 12/29/2015  . Drug abuse ([place]) 12/29/2015  . 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 Chronic narcotic use 07/26/2015  . Leg pain, right 07/26/2015  . 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 CKD (chronic kidney disease) stage 3, GFR 30-59 ml/min (HCC)   . Coronary artery disease due to lipid rich plaque   . Accelerated hypertension 12/27/2014  . History of noncompliance with medical treatment 12/27/2014    Current Outpatient Prescriptions:  .  amLODipine (NORVASC) 10 MG tablet, Take 1 tablet (10 mg total) by mouth daily., Disp: 30 tablet, Rfl: 3 .  aspirin 81 MG EC tablet, Take 1 tablet (81 mg total) by mouth daily., Disp: 30 tablet, Rfl: 0 .  atorvastatin (LIPITOR) 40 MG tablet, Take 1 tablet (40 mg total) by mouth daily at 6 PM., Disp: 30 tablet, Rfl: 6 .  carvedilol (COREG) 12.5 MG tablet, Take 1 tablet (12.5 mg total) by mouth 2 (two) times daily with a meal., Disp: 60 tablet, Rfl: 0 .  hydrALAZINE (APRESOLINE) 50 MG tablet, Take 1 tablet (50 mg total) by mouth 3 (three) times daily., Disp: 90 tablet, Rfl: 0 .  isosorbide mononitrate (IMDUR) 60 MG 24 hr tablet, Take 1 tablet (60 mg total) by mouth daily., Disp: 30 tablet, Rfl: 0 .  potassium chloride SA (K-DUR,KLOR-CON) 20 MEQ tablet, Take 2 tabs in AM and 1 tab in PM, Disp: 90 tablet, Rfl: 3 .  spironolactone (ALDACTONE) 25 MG tablet, Take 0.5 tablets (12.5 mg total) by mouth  daily., Disp: 30 tablet, Rfl: 0 .  torsemide (DEMADEX) 20 MG tablet, Take 2 tablets (40 mg total) by mouth 2 (two) times daily., Disp: 120 tablet, Rfl: 3 .  docusate sodium (COLACE) 100 MG capsule, Take 1 capsule (100 mg total) by mouth 2 (two) times daily., Disp: 10 capsule, Rfl: 0 .  nitroGLYCERIN (NITROSTAT) 0.4 MG SL tablet, Place 1 tablet (0.4 mg total) under the tongue every 5 (five) minutes as needed for chest pain., Disp: 25 tablet, Rfl: 0 Allergies  Allergen Reactions  . Lisinopril Anaphylaxis    Whole right side of face swelled.      Social History   Social History  . Marital status: Single    Spouse name: N/A  . Number of children: N/A  . Years of education: N/A   Occupational History  . Disabled    Social History Main Topics  . Smoking status: Current Every Day Smoker    Packs/day: 0.50    Years: 20.00    Types: Cigarettes  . Smokeless tobacco: Never Used  . Alcohol use No  . Drug use: Yes    Types: Marijuana     Comment: 12/30/2015 "have smoked it twice in last 3-4 months"  . Sexual activity: Not on file   Other Topics Concern  . Not on file   Social History Narrative   Pt lives with girlfriend    Physical Exam  Pulmonary/Chest: No respiratory distress. He has no wheezes. He has no rales.  Abdominal: He exhibits no distension. There is no tenderness. There is no guarding.  Musculoskeletal: He exhibits no edema.  Skin: Skin is warm and dry. He is not diaphoretic.        Future Appointments Date Time Provider [place]  03/01/2017 9:45 AM [facility] LAB [facility] None  03/15/2017 9:30 AM [facility] PA/NP [facility] None  04/05/2017 9:40 AM Larey Dresser, MD [facility] None    ATF pt CAO x4 with no complaints.  This is pt's first home visit, our initial visit was the clinic.  Pt stated that he felt a little "dizzy".  He had taken his meds about 1.5 ago.  Pt stated that he has issues sleeping sometimes at night and he wakes up early.  Pt is still having  a lot of trouble with his diet and drinking a lot of fluid.  Pt is willing to use the pill box and he was given a calendar for his weights.  We discussed how to read labels and add up sodium intake.  Pt stated that he felt like he's dehydrated because his muscles aches a lot during the day. Pt also has constipation which he still hasn't bought colace for.    Pt has all of his prescriptions;  He stated that he was taking his meds directly out of his pill bottles before today.  Pt stated that he has no issues with getting his medications.  rx bottles verified and pill box filled.   BP 132/76   Pulse 68   Resp 12   Wt 247 lb (112 kg)   SpO2 100%   BMI 36.48 kg/m       Almer Littleton, EMT Paramedic 02/28/2017    ACTION: Home visit completed Next visit planned for next wednesday

## 2017-03-01 ENCOUNTER — Ambulatory Visit (HOSPITAL_COMMUNITY)
Admission: RE | Admit: 2017-03-01 | Discharge: 2017-03-01 | Disposition: A | Discharge status: Home / Self Care | Payer: Medicaid Other | Source: Ambulatory Visit | Attending: Cardiology | Admitting: Cardiology

## 2017-03-01 DIAGNOSIS — I5022 Chronic systolic (congestive) heart failure: Secondary | ICD-10-CM | POA: Diagnosis present

## 2017-03-01 LAB — BASIC METABOLIC PANEL
Anion gap: 11 (ref 5–15)
BUN: 16 mg/dL (ref 6–20)
CALCIUM: 9 mg/dL (ref 8.9–10.3)
CHLORIDE: 98 mmol/L — AB (ref 101–111)
CO2: 28 mmol/L (ref 22–32)
CREATININE: 1.99 mg/dL — AB (ref 0.61–1.24)
GFR calc Af Amer: 45 mL/min — ABNORMAL LOW (ref >60)
GFR calc non Af Amer: 39 mL/min — ABNORMAL LOW (ref >60)
GLUCOSE: 124 mg/dL — AB (ref 65–99)
Potassium: 3.5 mmol/L (ref 3.5–5.1)
Sodium: 137 mmol/L (ref 135–145)

## 2017-03-07 ENCOUNTER — Other Ambulatory Visit (HOSPITAL_COMMUNITY): Payer: Self-pay

## 2017-03-07 NOTE — Progress Notes (Signed)
Paramedicine Encounter    Patient ID: Scott Salinas, male    DOB: [birth year], 46 y.o.   MRN: [id number]    Patient Care Team: Patient, No Pcp Per as PCP - General (General Practice)  Patient Active Problem List   Diagnosis Date Noted  . HLD (hyperlipidemia) 02/12/2017  . Tobacco abuse 02/12/2017  . Obesity (BMI 30-39.9) 02/12/2017  . CAD (coronary artery disease)   . Acute on chronic combined systolic and diastolic CHF (congestive heart failure) ([place]) 05/28/2016  . Prolonged Q-T interval on ECG 03/03/2016  . Right leg swelling 12/29/2015  . Drug abuse ([place]) 12/29/2015  . 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 Chronic narcotic use 07/26/2015  . Leg pain, right 07/26/2015  . 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 CKD (chronic kidney disease) stage 3, GFR 30-59 ml/min (HCC)   . Coronary artery disease due to lipid rich plaque   . Accelerated hypertension 12/27/2014  . History of noncompliance with medical treatment 12/27/2014    Current Outpatient Prescriptions:  .  amLODipine (NORVASC) 10 MG tablet, Take 1 tablet (10 mg total) by mouth daily., Disp: 30 tablet, Rfl: 3 .  aspirin 81 MG EC tablet, Take 1 tablet (81 mg total) by mouth daily., Disp: 30 tablet, Rfl: 0 .  atorvastatin (LIPITOR) 40 MG tablet, Take 1 tablet (40 mg total) by mouth daily at 6 PM., Disp: 30 tablet, Rfl: 6 .  carvedilol (COREG) 12.5 MG tablet, Take 1 tablet (12.5 mg total) by mouth 2 (two) times daily with a meal., Disp: 60 tablet, Rfl: 0 .  hydrALAZINE (APRESOLINE) 50 MG tablet, Take 1 tablet (50 mg total) by mouth 3 (three) times daily., Disp: 90 tablet, Rfl: 0 .  isosorbide mononitrate (IMDUR) 60 MG 24 hr tablet, Take 1 tablet (60 mg total) by mouth daily., Disp: 30 tablet, Rfl: 0 .  potassium chloride SA (K-DUR,KLOR-CON) 20 MEQ tablet, Take 2 tabs in AM and 1 tab in PM, Disp: 90 tablet, Rfl: 3 .  spironolactone (ALDACTONE) 25 MG tablet, Take 0.5 tablets (12.5 mg total) by mouth  daily., Disp: 30 tablet, Rfl: 0 .  torsemide (DEMADEX) 20 MG tablet, Take 2 tablets (40 mg total) by mouth 2 (two) times daily., Disp: 120 tablet, Rfl: 3 .  docusate sodium (COLACE) 100 MG capsule, Take 1 capsule (100 mg total) by mouth 2 (two) times daily., Disp: 10 capsule, Rfl: 0 .  nitroGLYCERIN (NITROSTAT) 0.4 MG SL tablet, Place 1 tablet (0.4 mg total) under the tongue every 5 (five) minutes as needed for chest pain., Disp: 25 tablet, Rfl: 0 Allergies  Allergen Reactions  . Lisinopril Anaphylaxis    Whole right side of face swelled.      Social History   Social History  . Marital status: Single    Spouse name: N/A  . Number of children: N/A  . Years of education: N/A   Occupational History  . Disabled    Social History Main Topics  . Smoking status: Current Every Day Smoker    Packs/day: 0.50    Years: 20.00    Types: Cigarettes  . Smokeless tobacco: Never Used  . Alcohol use No  . Drug use: Yes    Types: Marijuana     Comment: 12/30/2015 "have smoked it twice in last 3-4 months"  . Sexual activity: Not on file   Other Topics Concern  . Not on file   Social History Narrative   Pt lives with girlfriend    Physical Exam  Pulmonary/Chest: No respiratory distress. He has no wheezes.  Abdominal: He exhibits no distension. There is no tenderness. There is no guarding.  Musculoskeletal: He exhibits no edema.  Skin: Skin is warm and dry. He is not diaphoretic.        Future Appointments Date Time Provider [place]  03/15/2017 9:30 AM [facility] PA/NP [facility] None  04/05/2017 9:40 AM Larey Dresser, MD [facility] None    ATF pt CAO x4 laying on the couch with no complaints. Pt advised that he went to sleep last night, so he's tired today.  Pt has no sob, no dizziness, and no chest pain.  Pt has taken all f his mi=eds foor this weekl.  Pt took the morning and afternoon meds within 3 hours of each other. I advised him not wait the 8 hrs between hydralazine.  He  also stated that he weighs himself different times of the day and with clothes on sometimes and without sometimes.  We discussed him weighing around the same time daily in the morning and with the same amount of clothing on is preferred.  I explained the importance of his weight trend.  [facility] clinic notified of the same. rx bottles verified and pill box refilled.  Pt stated that using the pill box is a lot easier for him.    ** rx called in: Torsemide Potassium isos  BP (!) 92/0   Pulse 72   Resp 16   Wt 252 lb 11.2 oz (114.6 kg)   SpO2 98%   BMI 37.32 kg/m   Weight yesterday-251.8 Last visit weight-247    Andjela Wickes, EMT Paramedic 03/07/2017    ACTION: Home visit completed

## 2017-03-13 IMAGING — CR DG CHEST 2V
2 series · 2 of 2 positions shown · non-contrast
Comparison: 02/02/2013

CLINICAL DATA: Sharp left-sided chest pain and shortness of Breath

EXAM:
CHEST - 2 VIEW

[w chest pa]
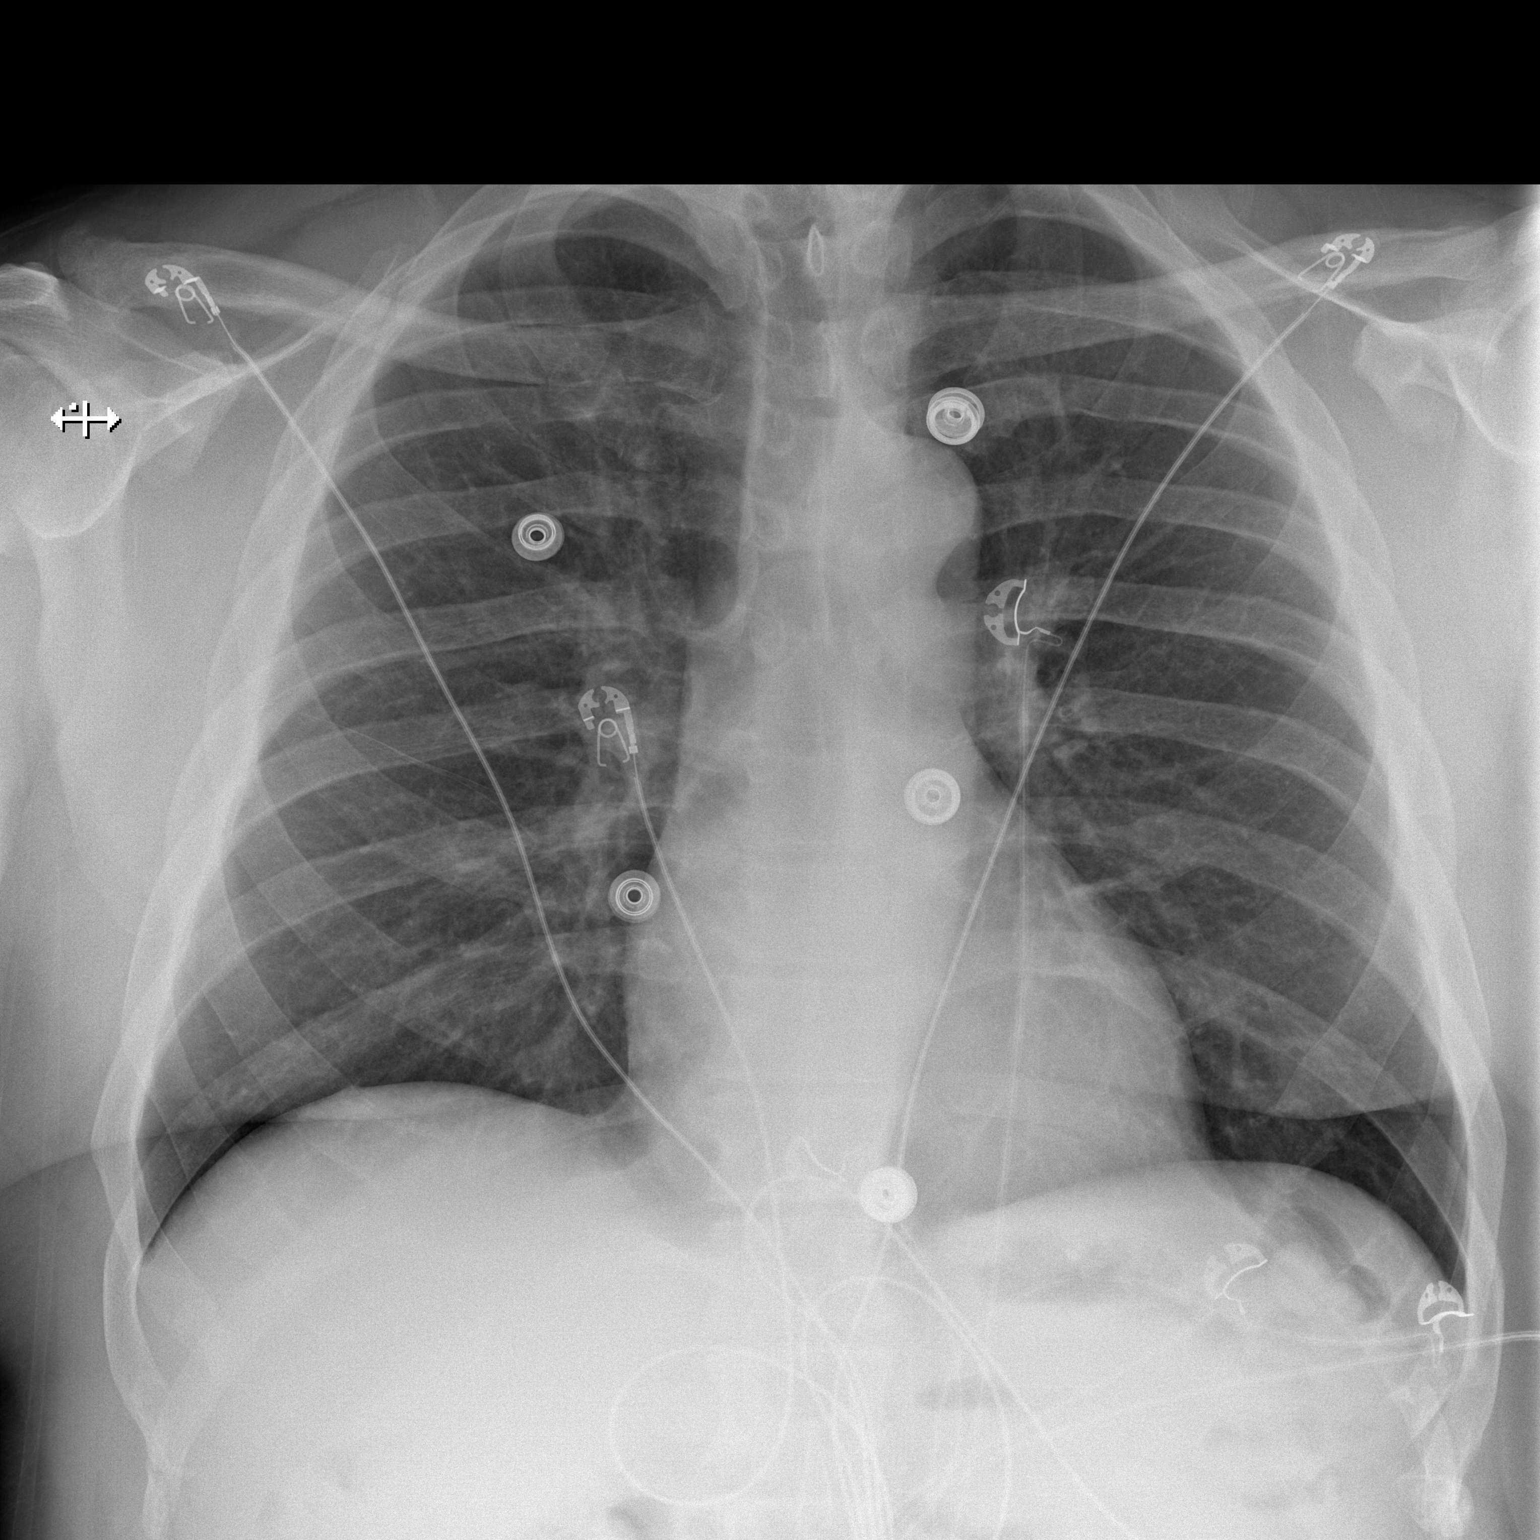

[w chest lat]
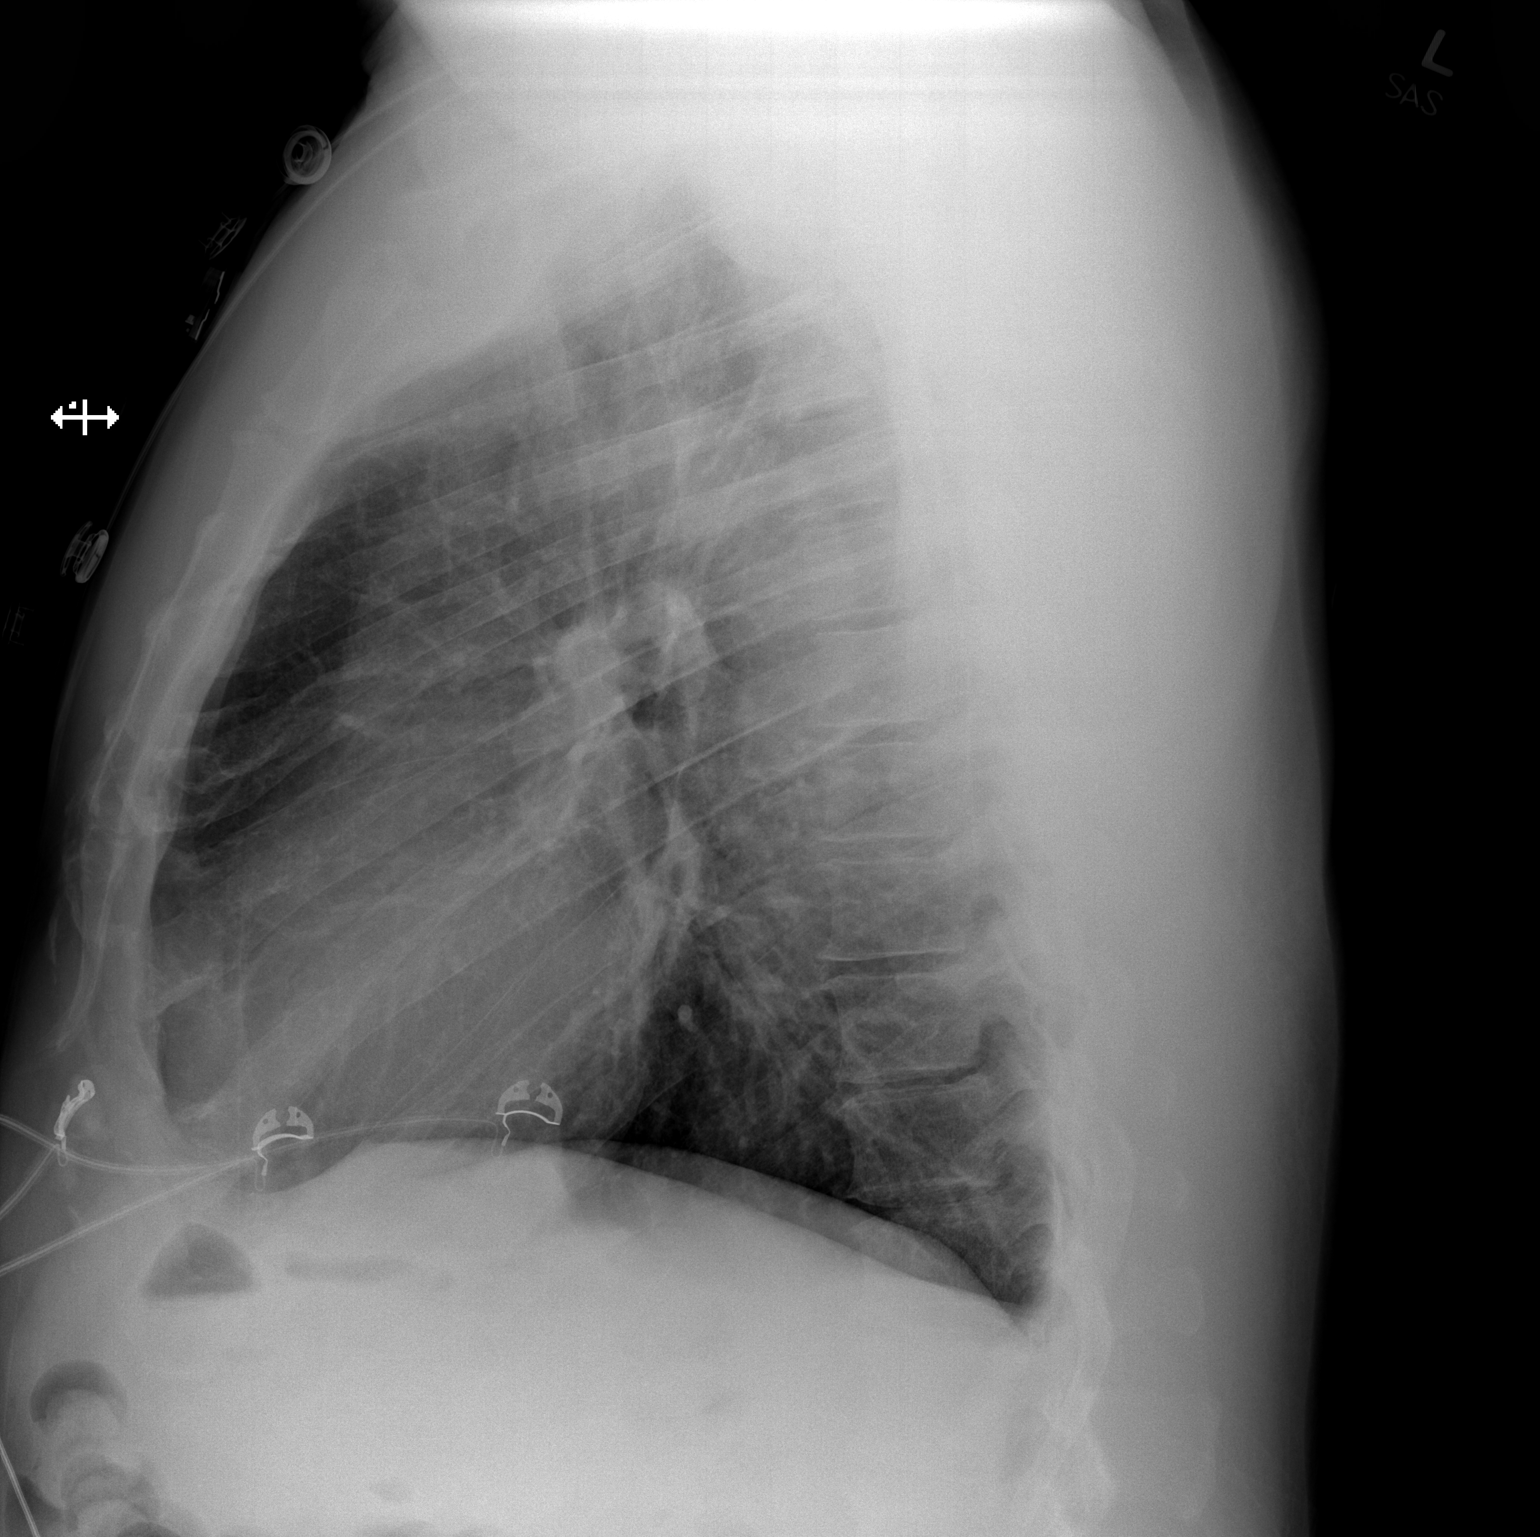

[2 of 2 positions shown; findings below may reference images not displayed]

FINDINGS: The heart size and mediastinal contours are within normal limits.
Both lungs are clear. The visualized skeletal structures are
unremarkable.
IMPRESSION: No active disease.

## 2017-03-15 ENCOUNTER — Other Ambulatory Visit (HOSPITAL_COMMUNITY): Payer: Self-pay

## 2017-03-15 ENCOUNTER — Ambulatory Visit (HOSPITAL_COMMUNITY)
Admission: RE | Admit: 2017-03-15 | Discharge: 2017-03-15 | Disposition: A | Discharge status: Home / Self Care | Payer: Medicaid Other | Source: Ambulatory Visit | Attending: Internal Medicine | Admitting: Internal Medicine

## 2017-03-15 ENCOUNTER — Encounter (HOSPITAL_COMMUNITY): Payer: Self-pay

## 2017-03-15 VITALS — BP 120/90 | HR 74 | Wt 252.8 lb

## 2017-03-15 DIAGNOSIS — Z8249 Family history of ischemic heart disease and other diseases of the circulatory system: Secondary | ICD-10-CM | POA: Diagnosis not present

## 2017-03-15 DIAGNOSIS — N183 Chronic kidney disease, stage 3 unspecified: Secondary | ICD-10-CM

## 2017-03-15 DIAGNOSIS — F17210 Nicotine dependence, cigarettes, uncomplicated: Secondary | ICD-10-CM | POA: Insufficient documentation

## 2017-03-15 DIAGNOSIS — I5022 Chronic systolic (congestive) heart failure: Secondary | ICD-10-CM | POA: Diagnosis not present

## 2017-03-15 DIAGNOSIS — I10 Essential (primary) hypertension: Secondary | ICD-10-CM

## 2017-03-15 DIAGNOSIS — I255 Ischemic cardiomyopathy: Secondary | ICD-10-CM | POA: Diagnosis not present

## 2017-03-15 DIAGNOSIS — Z951 Presence of aortocoronary bypass graft: Secondary | ICD-10-CM

## 2017-03-15 DIAGNOSIS — Z7982 Long term (current) use of aspirin: Secondary | ICD-10-CM | POA: Diagnosis not present

## 2017-03-15 DIAGNOSIS — Z79899 Other long term (current) drug therapy: Secondary | ICD-10-CM | POA: Insufficient documentation

## 2017-03-15 DIAGNOSIS — I25810 Atherosclerosis of coronary artery bypass graft(s) without angina pectoris: Secondary | ICD-10-CM | POA: Diagnosis not present

## 2017-03-15 DIAGNOSIS — I130 Hypertensive heart and chronic kidney disease with heart failure and stage 1 through stage 4 chronic kidney disease, or unspecified chronic kidney disease: Secondary | ICD-10-CM | POA: Diagnosis not present

## 2017-03-15 DIAGNOSIS — I2510 Atherosclerotic heart disease of native coronary artery without angina pectoris: Secondary | ICD-10-CM | POA: Diagnosis present

## 2017-03-15 MED ORDER — NITROGLYCERIN 0.4 MG SL SUBL: 0.4000 mg | SUBLINGUAL_TABLET | SUBLINGUAL | 0 refills | Status: DC | PRN

## 2017-03-15 MED ORDER — CARVEDILOL 12.5 MG PO TABS: 18.7500 mg | ORAL_TABLET | Freq: Two times a day (BID) | ORAL | 6 refills | Status: DC
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
## 2017-03-15 NOTE — Patient Instructions (Signed)
INCREASE Carvedilol (Coreg) 18.75 mg (1.5 tabs) twice daily.  Nitroglycerin has been refilled.  Follow up with Amy Clegg NP-C in 2 weeks.  Take all medication as prescribed the day of your appointment. Bring all medications with you to your appointment.  Do the following things EVERYDAY: 1) Weigh yourself in the morning before breakfast. Write it down and keep it in a log. 2) Take your medicines as prescribed 3) Eat low salt foods-Limit salt (sodium) to 2000 mg per day.  4) Stay as active as you can everyday 5) Limit all fluids for the day to less than 2 liters

## 2017-03-15 NOTE — Progress Notes (Signed)
Paramedicine Encounter   Patient ID: Scott Salinas , male,   DOB: Mar 04, 1971,46 y.o.,  MRN: [id number]   Met patient in clinic today with provider Amy.   Pt arrived to the clinic with no complaints.  She discussed his EF; she advised him that hopefully the medication will improve it and if they will discuss d-fib.  Pt stated that he walked [place] the mall 3 separate times sat and he never became sob.  He stated that its because he doesn't walk fast.  Pt has started smoking more cigarettes due to stress. Pt is only eating once a day but he continues to drink a lot of fluids. Amy stated that his fluid level is low and he will need to return in two weeks.   rx bottles verified and pill box refilled in clinic.  ** rx changed during visit: Carvedilol 1.5 pills twice daily **rx called in: Carvedilol Hydralazine Amlodipine  Time spent with patient: 35 mins  Roseanne Juenger, EMT-Paramedic 03/15/2017   ACTION: Home visit completed Next visit planned for next wed

## 2017-03-21 ENCOUNTER — Other Ambulatory Visit (HOSPITAL_COMMUNITY): Payer: Self-pay

## 2017-03-21 NOTE — Progress Notes (Signed)
Paramedicine Encounter    Patient ID: Scott Salinas, male    DOB: [birth year], 46 y.o.   MRN: [id number]    Patient Care Team: Patient, No Pcp Per as PCP - General (General Practice)  Patient Active Problem List   Diagnosis Date Noted  . HLD (hyperlipidemia) 02/12/2017  . Tobacco abuse 02/12/2017  . Obesity (BMI 30-39.9) 02/12/2017  . CAD (coronary artery disease)   . Acute on chronic combined systolic and diastolic CHF (congestive heart failure) ([place]) 05/28/2016  . Prolonged Q-T interval on ECG 03/03/2016  . Right leg swelling 12/29/2015  . Drug abuse ([place]) 12/29/2015  . 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 Chronic narcotic use 07/26/2015  . Leg pain, right 07/26/2015  . 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 CKD (chronic kidney disease) stage 3, GFR 30-59 ml/min (HCC)   . Coronary artery disease due to lipid rich plaque   . Accelerated hypertension 12/27/2014  . History of noncompliance with medical treatment 12/27/2014    Current Outpatient Medications:  .  amLODipine (NORVASC) 10 MG tablet, Take 1 tablet (10 mg total) by mouth daily., Disp: 30 tablet, Rfl: 3 .  aspirin 81 MG EC tablet, Take 1 tablet (81 mg total) by mouth daily., Disp: 30 tablet, Rfl: 0 .  atorvastatin (LIPITOR) 40 MG tablet, Take 1 tablet (40 mg total) by mouth daily at 6 PM., Disp: 30 tablet, Rfl: 6 .  carvedilol (COREG) 12.5 MG tablet, Take 1.5 tablets (18.75 mg total) 2 (two) times daily with a meal by mouth., Disp: 90 tablet, Rfl: 6 .  hydrALAZINE (APRESOLINE) 50 MG tablet, Take 1 tablet (50 mg total) by mouth 3 (three) times daily., Disp: 90 tablet, Rfl: 0 .  isosorbide mononitrate (IMDUR) 60 MG 24 hr tablet, Take 1 tablet (60 mg total) by mouth daily., Disp: 30 tablet, Rfl: 0 .  potassium chloride SA (K-DUR,KLOR-CON) 20 MEQ tablet, Take 2 tabs in AM and 1 tab in PM, Disp: 90 tablet, Rfl: 3 .  spironolactone (ALDACTONE) 25 MG tablet, Take 0.5 tablets (12.5 mg total) by mouth  daily., Disp: 30 tablet, Rfl: 0 .  torsemide (DEMADEX) 20 MG tablet, Take 2 tablets (40 mg total) by mouth 2 (two) times daily., Disp: 120 tablet, Rfl: 3 .  docusate sodium (COLACE) 100 MG capsule, Take 1 capsule (100 mg total) by mouth 2 (two) times daily. (Patient not taking: Reported on 03/15/2017), Disp: 10 capsule, Rfl: 0 .  nitroGLYCERIN (NITROSTAT) 0.4 MG SL tablet, Place 1 tablet (0.4 mg total) every 5 (five) minutes as needed under the tongue for chest pain., Disp: 25 tablet, Rfl: 0 Allergies  Allergen Reactions  . Lisinopril Anaphylaxis    Whole right side of face swelled.      Social History   Socioeconomic History  . Marital status: Single    Spouse name: Not on file  . Number of children: Not on file  . Years of education: Not on file  . Highest education level: Not on file  Social Needs  . Financial resource strain: Not on file  . Food insecurity - worry: Not on file  . Food insecurity - inability: Not on file  . Transportation needs - medical: Not on file  . Transportation needs - non-medical: Not on file  Occupational History  . Occupation: Disabled  Tobacco Use  . Smoking status: Current Every Day Smoker    Packs/day: 0.50    Years: 20.00    Pack years: 10.00    Types:  Cigarettes  . Smokeless tobacco: Never Used  Substance and Sexual Activity  . Alcohol use: No    Alcohol/week: 0.0 oz  . Drug use: Yes    Types: Marijuana    Comment: 12/30/2015 "have smoked it twice in last 3-4 months"  . Sexual activity: Not on file  Other Topics Concern  . Not on file  Social History Narrative   Pt lives with girlfriend    Physical Exam  Pulmonary/Chest: No respiratory distress. He has no wheezes. He has no rales.  Abdominal: He exhibits no distension. There is no tenderness. There is no guarding.  Genitourinary: No penile tenderness.  Genitourinary Comments: No swelling in his groining area   Musculoskeletal: He exhibits no edema.  Skin: Skin is warm and dry. He  is not diaphoretic.        Future Appointments  Date Time Provider [place]  04/05/2017  9:40 AM Larey Dresser, MD [facility] None    ATF pt CAO x4 sitting at the kitchen table with no complaints. Pt stated that he had a busy weekend and he missed his meds all day sat and Sunday morning.  Pt stated that he had his pills with him but he still didn't take them.  Pt denies sob, dizziness, and chest pain.  Pt was advised of the consequences from not taking his meds as prescribed.  Pt is still watching what he eats and is trying to watch his fluid intake.  rx bottles verified and pill box refilled.    BP (!) 124/100   Pulse 73   Resp 16   Wt 243 lb (110.2 kg)   SpO2 97%   BMI 35.88 kg/m   Weight yesterday-243.1 Last visit weight-241 (home) 251 (clinic visit)  **rx called in: Carvedilol ( none im sun-wed) Amlodipine [place] Hydralazine nitro  Jaquilla Woodroof, EMT Paramedic 03/21/2017    ACTION: Home visit completed

## 2017-03-23 ENCOUNTER — Encounter (HOSPITAL_COMMUNITY): Payer: Medicaid Other | Admitting: Cardiology

## 2017-03-27 ENCOUNTER — Encounter (HOSPITAL_COMMUNITY): Payer: Medicaid Other

## 2017-03-28 ENCOUNTER — Encounter (HOSPITAL_COMMUNITY): Payer: Self-pay

## 2017-03-28 ENCOUNTER — Other Ambulatory Visit (HOSPITAL_COMMUNITY): Payer: Self-pay

## 2017-03-28 NOTE — Progress Notes (Signed)
Paramedicine Encounter    Patient ID: Scott Salinas, male    DOB: Dec 22, 1970, 46 y.o.   MRN: [id number]    Patient Care Team: Patient, No Pcp Per as PCP - General (General Practice)  Patient Active Problem List   Diagnosis Date Noted  . HLD (hyperlipidemia) 02/12/2017  . Tobacco abuse 02/12/2017  . Obesity (BMI 30-39.9) 02/12/2017  . CAD (coronary artery disease)   . Acute on chronic combined systolic and diastolic CHF (congestive heart failure) ([place]) 05/28/2016  . Prolonged Q-T interval on ECG 03/03/2016  . Right leg swelling 12/29/2015  . Drug abuse ([place]) 12/29/2015  . 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 Chronic narcotic use 07/26/2015  . Leg pain, right 07/26/2015  . 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 CKD (chronic kidney disease) stage 3, GFR 30-59 ml/min (HCC)   . Coronary artery disease due to lipid rich plaque   . Accelerated hypertension 12/27/2014  . History of noncompliance with medical treatment 12/27/2014    Current Outpatient Medications:  .  amLODipine (NORVASC) 10 MG tablet, Take 1 tablet (10 mg total) by mouth daily., Disp: 30 tablet, Rfl: 3 .  aspirin 81 MG EC tablet, Take 1 tablet (81 mg total) by mouth daily., Disp: 30 tablet, Rfl: 0 .  atorvastatin (LIPITOR) 40 MG tablet, Take 1 tablet (40 mg total) by mouth daily at 6 PM., Disp: 30 tablet, Rfl: 6 .  carvedilol (COREG) 12.5 MG tablet, Take 1.5 tablets (18.75 mg total) 2 (two) times daily with a meal by mouth., Disp: 90 tablet, Rfl: 6 .  hydrALAZINE (APRESOLINE) 50 MG tablet, Take 1 tablet (50 mg total) by mouth 3 (three) times daily., Disp: 90 tablet, Rfl: 0 .  isosorbide mononitrate (IMDUR) 60 MG 24 hr tablet, Take 1 tablet (60 mg total) by mouth daily., Disp: 30 tablet, Rfl: 0 .  potassium chloride SA (K-DUR,KLOR-CON) 20 MEQ tablet, Take 2 tabs in AM and 1 tab in PM, Disp: 90 tablet, Rfl: 3 .  spironolactone (ALDACTONE) 25 MG tablet, Take 0.5 tablets (12.5 mg total) by mouth  daily., Disp: 30 tablet, Rfl: 0 .  torsemide (DEMADEX) 20 MG tablet, Take 2 tablets (40 mg total) by mouth 2 (two) times daily., Disp: 120 tablet, Rfl: 3 .  docusate sodium (COLACE) 100 MG capsule, Take 1 capsule (100 mg total) by mouth 2 (two) times daily. (Patient not taking: Reported on 03/15/2017), Disp: 10 capsule, Rfl: 0 .  nitroGLYCERIN (NITROSTAT) 0.4 MG SL tablet, Place 1 tablet (0.4 mg total) every 5 (five) minutes as needed under the tongue for chest pain., Disp: 25 tablet, Rfl: 0 Allergies  Allergen Reactions  . Lisinopril Anaphylaxis    Whole right side of face swelled.      Social History   Socioeconomic History  . Marital status: Single    Spouse name: Not on file  . Number of children: Not on file  . Years of education: Not on file  . Highest education level: Not on file  Social Needs  . Financial resource strain: Not on file  . Food insecurity - worry: Not on file  . Food insecurity - inability: Not on file  . Transportation needs - medical: Not on file  . Transportation needs - non-medical: Not on file  Occupational History  . Occupation: Disabled  Tobacco Use  . Smoking status: Current Every Day Smoker    Packs/day: 0.50    Years: 20.00    Pack years: 10.00    Types:  Cigarettes  . Smokeless tobacco: Never Used  Substance and Sexual Activity  . Alcohol use: No    Alcohol/week: 0.0 oz  . Drug use: Yes    Types: Marijuana    Comment: 12/30/2015 "have smoked it twice in last 3-4 months"  . Sexual activity: Not on file  Other Topics Concern  . Not on file  Social History Narrative   Pt lives with girlfriend    Physical Exam  Pulmonary/Chest: No respiratory distress. He has no wheezes. He has no rales.  Abdominal: He exhibits no distension. There is no tenderness. There is no guarding.  Musculoskeletal: He exhibits no edema.  Skin: Skin is warm and dry. He is not diaphoretic.        Future Appointments  Date Time Provider [place]   04/05/2017  9:40 AM Larey Dresser, MD [facility] None    ATF pt CAO x4 sitting at the kitchen table c/o headache.  Pt stated that his head hurts in the am after he takes his meds. He has taken anything for the headache. Pt denies dizziness, nausea, sob and chest pain.  Pt stated that he wants help with smoking he has increased the amount of cigaretts that he smokes daily.  He said that cyntx didn't work last time "because they gave him crazy dreams".  Pt has taken all of his meds besides 2 doeses (1)sat afternoon and (1) Monday niight.  Pt continues to watch what he eats, he's currently only eating once daily.  Pt hasn't had to increase the amount of pillows that he use.  rx bottles verified and pill box refilled. (2) pt will now be seen bi-weekly,  BP 114/70 (BP Location: Left Arm, Patient Position: Sitting, Cuff Size: Large)   Pulse 63   Resp 16   Wt 246 lb (111.6 kg)   SpO2 98%   BMI 36.33 kg/m   Weight yesterday-246 Last visit weight-243    Skylen Danielsen, EMT Paramedic 03/28/2017    ACTION: Home visit completed Next visit planned for dec 5

## 2017-04-05 ENCOUNTER — Encounter (HOSPITAL_COMMUNITY): Payer: Medicaid Other | Admitting: Cardiology

## 2017-04-09 ENCOUNTER — Encounter (HOSPITAL_COMMUNITY): Payer: Medicaid Other | Admitting: Cardiology

## 2017-04-12 ENCOUNTER — Encounter (HOSPITAL_COMMUNITY): Payer: Self-pay

## 2017-04-12 ENCOUNTER — Other Ambulatory Visit (HOSPITAL_COMMUNITY): Payer: Self-pay

## 2017-04-12 NOTE — Progress Notes (Signed)
Paramedicine Encounter    Patient ID: Scott Salinas, male    DOB: [birth year], 46 y.o.   MRN: [id number]    Patient Care Team: Patient, No Pcp Per as PCP - General (General Practice)  Patient Active Problem List   Diagnosis Date Noted  . HLD (hyperlipidemia) 02/12/2017  . Tobacco abuse 02/12/2017  . Obesity (BMI 30-39.9) 02/12/2017  . CAD (coronary artery disease)   . Acute on chronic combined systolic and diastolic CHF (congestive heart failure) ([place]) 05/28/2016  . Prolonged Q-T interval on ECG 03/03/2016  . Right leg swelling 12/29/2015  . Drug abuse ([place]) 12/29/2015  . 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 Chronic narcotic use 07/26/2015  . Leg pain, right 07/26/2015  . 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 CKD (chronic kidney disease) stage 3, GFR 30-59 ml/min (HCC)   . Coronary artery disease due to lipid rich plaque   . Accelerated hypertension 12/27/2014  . History of noncompliance with medical treatment 12/27/2014    Current Outpatient Medications:  .  amLODipine (NORVASC) 10 MG tablet, Take 1 tablet (10 mg total) by mouth daily., Disp: 30 tablet, Rfl: 3 .  aspirin 81 MG EC tablet, Take 1 tablet (81 mg total) by mouth daily., Disp: 30 tablet, Rfl: 0 .  atorvastatin (LIPITOR) 40 MG tablet, Take 1 tablet (40 mg total) by mouth daily at 6 PM., Disp: 30 tablet, Rfl: 6 .  carvedilol (COREG) 12.5 MG tablet, Take 1.5 tablets (18.75 mg total) 2 (two) times daily with a meal by mouth., Disp: 90 tablet, Rfl: 6 .  hydrALAZINE (APRESOLINE) 50 MG tablet, Take 1 tablet (50 mg total) by mouth 3 (three) times daily., Disp: 90 tablet, Rfl: 0 .  potassium chloride SA (K-DUR,KLOR-CON) 20 MEQ tablet, Take 2 tabs in AM and 1 tab in PM, Disp: 90 tablet, Rfl: 3 .  spironolactone (ALDACTONE) 25 MG tablet, Take 0.5 tablets (12.5 mg total) by mouth daily., Disp: 30 tablet, Rfl: 0 .  torsemide (DEMADEX) 20 MG tablet, Take 2 tablets (40 mg total) by mouth 2 (two) times  daily., Disp: 120 tablet, Rfl: 3 .  docusate sodium (COLACE) 100 MG capsule, Take 1 capsule (100 mg total) by mouth 2 (two) times daily. (Patient not taking: Reported on 03/15/2017), Disp: 10 capsule, Rfl: 0 .  isosorbide mononitrate (IMDUR) 60 MG 24 hr tablet, Take 1 tablet (60 mg total) by mouth daily., Disp: 30 tablet, Rfl: 0 .  nitroGLYCERIN (NITROSTAT) 0.4 MG SL tablet, Place 1 tablet (0.4 mg total) every 5 (five) minutes as needed under the tongue for chest pain., Disp: 25 tablet, Rfl: 0 Allergies  Allergen Reactions  . Lisinopril Anaphylaxis    Whole right side of face swelled.      Social History   Socioeconomic History  . Marital status: Single    Spouse name: Not on file  . Number of children: Not on file  . Years of education: Not on file  . Highest education level: Not on file  Social Needs  . Financial resource strain: Not on file  . Food insecurity - worry: Not on file  . Food insecurity - inability: Not on file  . Transportation needs - medical: Not on file  . Transportation needs - non-medical: Not on file  Occupational History  . Occupation: Disabled  Tobacco Use  . Smoking status: Current Every Day Smoker    Packs/day: 0.50    Years: 20.00    Pack years: 10.00    Types:  Cigarettes  . Smokeless tobacco: Never Used  Substance and Sexual Activity  . Alcohol use: No    Alcohol/week: 0.0 oz  . Drug use: Yes    Types: Marijuana    Comment: 12/30/2015 "have smoked it twice in last 3-4 months"  . Sexual activity: Not on file  Other Topics Concern  . Not on file  Social History Narrative   Pt lives with girlfriend    Physical Exam  Pulmonary/Chest: No respiratory distress. He has no wheezes. He has no rales.  Abdominal: He exhibits no distension. There is no tenderness. There is no guarding.  Musculoskeletal: He exhibits no edema.  Skin: Skin is warm and dry. He is not diaphoretic.        No future appointments.  ATF pt CAO x4 sitting at the kitchen  table playing a game on his phone c/o headache.  He stated that he usually have an headache after he takes his morning meds.  The pain usually goes away on its own without taking pain relievers.  Pt denies sob, dizziness and chest pain.  Pt has taken all of his meds and hasn't missed any does with the past two weeks.  Pt continues to have issues with sleeping throughout the night. We talked about him getting a pcp again during this visit.  Pt continues to weigh and watch what he eats.  rx bottles verified and pill box refilled.  We went over the pills that were missing and where to put them when he picks up his prescription tomorrow.    BP (!) 136/96 (BP Location: Left Arm, Patient Position: Sitting, Cuff Size: Normal)   Pulse 76   Resp 16   Wt 246 lb (111.6 kg)   SpO2 98%   BMI 36.33 kg/m   **rx called in: Torsemide filled until sun 1st box Potassium filled until Monday 2nd box Carvedilol Amlodipine Hydralazine Atorvastatin spirolactone  rx will be ready 12/7  Weight yesterday-248 Last visit weight-246    Bryson Palen, EMT Paramedic 04/12/2017    ACTION: Home visit completed

## 2017-04-25 ENCOUNTER — Telehealth (HOSPITAL_COMMUNITY): Payer: Self-pay

## 2017-04-26 NOTE — Telephone Encounter (Signed)
I contacted pt to confirm our appointment but he asked if we could re-schedule until he comes back from out of town.  Pt agrees to meet next week.

## 2017-05-11 ENCOUNTER — Other Ambulatory Visit (HOSPITAL_COMMUNITY): Payer: Self-pay

## 2017-05-11 NOTE — Progress Notes (Signed)
Paramedicine Encounter    Patient ID: Scott Salinas, male    DOB: 12/22/1970, 47 y.o.   MRN: [id number]    Patient Care Team: Patient, No Pcp Per as PCP - General (General Practice)  Patient Active Problem List   Diagnosis Date Noted  . HLD (hyperlipidemia) 02/12/2017  . Tobacco abuse 02/12/2017  . Obesity (BMI 30-39.9) 02/12/2017  . CAD (coronary artery disease)   . Acute on chronic combined systolic and diastolic CHF (congestive heart failure) ([place]) 05/28/2016  . Prolonged Q-T interval on ECG 03/03/2016  . Right leg swelling 12/29/2015  . Drug abuse ([place]) 12/29/2015  . 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 Chronic narcotic use 07/26/2015  . Leg pain, right 07/26/2015  . 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 CKD (chronic kidney disease) stage 3, GFR 30-59 ml/min (HCC)   . Coronary artery disease due to lipid rich plaque   . Accelerated hypertension 12/27/2014  . History of noncompliance with medical treatment 12/27/2014    Current Outpatient Medications:  .  amLODipine (NORVASC) 10 MG tablet, Take 1 tablet (10 mg total) by mouth daily., Disp: 30 tablet, Rfl: 3 .  aspirin 81 MG EC tablet, Take 1 tablet (81 mg total) by mouth daily., Disp: 30 tablet, Rfl: 0 .  atorvastatin (LIPITOR) 40 MG tablet, Take 1 tablet (40 mg total) by mouth daily at 6 PM., Disp: 30 tablet, Rfl: 6 .  carvedilol (COREG) 12.5 MG tablet, Take 1.5 tablets (18.75 mg total) 2 (two) times daily with a meal by mouth., Disp: 90 tablet, Rfl: 6 .  hydrALAZINE (APRESOLINE) 50 MG tablet, Take 1 tablet (50 mg total) by mouth 3 (three) times daily., Disp: 90 tablet, Rfl: 0 .  potassium chloride SA (K-DUR,KLOR-CON) 20 MEQ tablet, Take 2 tabs in AM and 1 tab in PM, Disp: 90 tablet, Rfl: 3 .  spironolactone (ALDACTONE) 25 MG tablet, Take 0.5 tablets (12.5 mg total) by mouth daily., Disp: 30 tablet, Rfl: 0 .  docusate sodium (COLACE) 100 MG capsule, Take 1 capsule (100 mg total) by mouth 2 (two)  times daily. (Patient not taking: Reported on 03/15/2017), Disp: 10 capsule, Rfl: 0 .  isosorbide mononitrate (IMDUR) 60 MG 24 hr tablet, Take 1 tablet (60 mg total) by mouth daily., Disp: 30 tablet, Rfl: 0 .  nitroGLYCERIN (NITROSTAT) 0.4 MG SL tablet, Place 1 tablet (0.4 mg total) every 5 (five) minutes as needed under the tongue for chest pain., Disp: 25 tablet, Rfl: 0 .  torsemide (DEMADEX) 20 MG tablet, Take 2 tablets (40 mg total) by mouth 2 (two) times daily., Disp: 120 tablet, Rfl: 3 Allergies  Allergen Reactions  . Lisinopril Anaphylaxis    Whole right side of face swelled.      Social History   Socioeconomic History  . Marital status: Single    Spouse name: Not on file  . Number of children: Not on file  . Years of education: Not on file  . Highest education level: Not on file  Social Needs  . Financial resource strain: Not on file  . Food insecurity - worry: Not on file  . Food insecurity - inability: Not on file  . Transportation needs - medical: Not on file  . Transportation needs - non-medical: Not on file  Occupational History  . Occupation: Disabled  Tobacco Use  . Smoking status: Current Every Day Smoker    Packs/day: 0.50    Years: 20.00    Pack years: 10.00    Types:  Cigarettes  . Smokeless tobacco: Never Used  Substance and Sexual Activity  . Alcohol use: No    Alcohol/week: 0.0 oz  . Drug use: Yes    Types: Marijuana    Comment: 12/30/2015 "have smoked it twice in last 3-4 months"  . Sexual activity: Not on file  Other Topics Concern  . Not on file  Social History Narrative   Pt lives with girlfriend    Physical Exam  Pulmonary/Chest: No respiratory distress. He has no wheezes. He has no rales.  Abdominal: He exhibits no distension. There is no tenderness.  Musculoskeletal: He exhibits no edema.  Skin: Skin is warm and dry. He is not diaphoretic.        No future appointments.  ATF pt CAO x4 sitting at the kitchen table c/o right shoulder  pain.  Pt is unaware of why his shoulder hurts, he denies injurying it and lifting. Pt was out of town for the holidays. He stated that he took all of his meds and didn't miss any.  Pt denies sob, dizziness and chest pain.  Pt stated that he hasn't gained any weight during the holidays.  Pt still need a pcp, which I gave him a few physicians in the area for him to see If they're accepting new pts. rx bottles verified and pill box refilled.   **rx called in: Hydralazine (1st box filled untiltues) Torsemide asa  BP (!) 156/92 (BP Location: Left Arm, Patient Position: Sitting, Cuff Size: Large)   Pulse 71   Resp 16   Wt 244 lb 8 oz (110.9 kg)   SpO2 98%   BMI 36.11 kg/m   Weight yesterday-can't remember but think it's the same as today Last visit weight-246    Rileyann Florance, EMT Paramedic 05/11/2017    ACTION: Home visit completed Next visit planned for in two weeks

## 2017-05-23 ENCOUNTER — Telehealth: Payer: Self-pay | Admitting: *Deleted

## 2017-05-24 ENCOUNTER — Other Ambulatory Visit (HOSPITAL_COMMUNITY): Payer: Self-pay | Admitting: *Deleted

## 2017-05-24 ENCOUNTER — Telehealth (HOSPITAL_COMMUNITY): Payer: Self-pay

## 2017-05-24 ENCOUNTER — Other Ambulatory Visit (HOSPITAL_COMMUNITY): Payer: Self-pay

## 2017-05-24 DIAGNOSIS — I5022 Chronic systolic (congestive) heart failure: Secondary | ICD-10-CM

## 2017-05-24 MED ORDER — ISOSORBIDE MONONITRATE ER 60 MG PO TB24: 60.0000 mg | ORAL_TABLET | Freq: Every day | ORAL | 1 refills | Status: DC

## 2017-05-24 MED ORDER — HYDRALAZINE HCL 50 MG PO TABS: 50.0000 mg | ORAL_TABLET | Freq: Three times a day (TID) | ORAL | 1 refills | Status: DC

## 2017-05-24 MED FILL — ISOSORBIDE MN ER 60 MG TAB: 60 | 30 days supply | Qty: 30 | Fill #0

## 2017-05-24 MED FILL — hydrALAZINE HCL 50 MG TABS: 50 | 30 days supply | Qty: 90 | Fill #0

## 2017-05-24 NOTE — Telephone Encounter (Signed)
Attempted to contact patient in order to provide information on [employer]-HF research study.  Will follow up if message not returned.

## 2017-05-24 NOTE — Telephone Encounter (Signed)
Advanced Heart Failure Triage Encounter  Patient Name: Scott Salinas  Date of Call: 05/24/17  Problem:  Dee (paramed) called b/c pt has gained 10 lbs 2 weeks. Pt does recall drinking a lot of fluids. Pt denies SOB,Cough, edema, CP. Pt has taken all meds except hydralazine (was  out) as prescribed.  Plan:  Amy NP orders 80 mg BID for 2 days. Pt needs f/u appt and labs ([facility]). Called Pt who was not home left message with spouse to have him call back. Called Dee to make aware.   Shirley Muscat, RN

## 2017-05-24 NOTE — Progress Notes (Signed)
Paramedicine Encounter    Patient ID: Helmuth Recupero, male    DOB: 1971/03/05, 47 y.o.   MRN: [id number]    Patient Care Team: Patient, No Pcp Per as PCP - General (General Practice)  Patient Active Problem List   Diagnosis Date Noted  . HLD (hyperlipidemia) 02/12/2017  . Tobacco abuse 02/12/2017  . Obesity (BMI 30-39.9) 02/12/2017  . CAD (coronary artery disease)   . Acute on chronic combined systolic and diastolic CHF (congestive heart failure) ([place]) 05/28/2016  . Prolonged Q-T interval on ECG 03/03/2016  . Right leg swelling 12/29/2015  . Drug abuse ([place]) 12/29/2015  . 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 Chronic narcotic use 07/26/2015  . Leg pain, right 07/26/2015  . 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 CKD (chronic kidney disease) stage 3, GFR 30-59 ml/min (HCC)   . Coronary artery disease due to lipid rich plaque   . Accelerated hypertension 12/27/2014  . History of noncompliance with medical treatment 12/27/2014    Current Outpatient Medications:  .  amLODipine (NORVASC) 10 MG tablet, Take 1 tablet (10 mg total) by mouth daily., Disp: 30 tablet, Rfl: 3 .  aspirin 81 MG EC tablet, Take 1 tablet (81 mg total) by mouth daily., Disp: 30 tablet, Rfl: 0 .  atorvastatin (LIPITOR) 40 MG tablet, Take 1 tablet (40 mg total) by mouth daily at 6 PM., Disp: 30 tablet, Rfl: 6 .  carvedilol (COREG) 12.5 MG tablet, Take 1.5 tablets (18.75 mg total) 2 (two) times daily with a meal by mouth., Disp: 90 tablet, Rfl: 6 .  potassium chloride SA (K-DUR,KLOR-CON) 20 MEQ tablet, Take 2 tabs in AM and 1 tab in PM, Disp: 90 tablet, Rfl: 3 .  spironolactone (ALDACTONE) 25 MG tablet, Take 0.5 tablets (12.5 mg total) by mouth daily., Disp: 30 tablet, Rfl: 0 .  torsemide (DEMADEX) 20 MG tablet, Take 2 tablets (40 mg total) by mouth 2 (two) times daily., Disp: 120 tablet, Rfl: 3 .  docusate sodium (COLACE) 100 MG capsule, Take 1 capsule (100 mg total) by mouth 2 (two)  times daily. (Patient not taking: Reported on 03/15/2017), Disp: 10 capsule, Rfl: 0 .  hydrALAZINE (APRESOLINE) 50 MG tablet, Take 1 tablet (50 mg total) by mouth 3 (three) times daily., Disp: 90 tablet, Rfl: 1 .  isosorbide mononitrate (IMDUR) 60 MG 24 hr tablet, Take 1 tablet (60 mg total) by mouth daily., Disp: 30 tablet, Rfl: 1 .  nitroGLYCERIN (NITROSTAT) 0.4 MG SL tablet, Place 1 tablet (0.4 mg total) every 5 (five) minutes as needed under the tongue for chest pain., Disp: 25 tablet, Rfl: 0 Allergies  Allergen Reactions  . Lisinopril Anaphylaxis    Whole right side of face swelled.      Social History   Socioeconomic History  . Marital status: Single    Spouse name: Not on file  . Number of children: Not on file  . Years of education: Not on file  . Highest education level: Not on file  Social Needs  . Financial resource strain: Not on file  . Food insecurity - worry: Not on file  . Food insecurity - inability: Not on file  . Transportation needs - medical: Not on file  . Transportation needs - non-medical: Not on file  Occupational History  . Occupation: Disabled  Tobacco Use  . Smoking status: Current Every Day Smoker    Packs/day: 0.50    Years: 20.00    Pack years: 10.00    Types:  Cigarettes  . Smokeless tobacco: Never Used  Substance and Sexual Activity  . Alcohol use: No    Alcohol/week: 0.0 oz  . Drug use: Yes    Types: Marijuana    Comment: 12/30/2015 "have smoked it twice in last 3-4 months"  . Sexual activity: Not on file  Other Topics Concern  . Not on file  Social History Narrative   Pt lives with girlfriend    Physical Exam  Pulmonary/Chest: No respiratory distress.  Abdominal: He exhibits no distension. There is no tenderness.  Musculoskeletal: He exhibits tenderness. He exhibits no edema or deformity.  Pt has right shoulder and arm pain  Skin: Skin is warm and dry. He is not diaphoretic.        Future Appointments  Date Time Provider  [place]  06/15/2017  2:20 PM Larey Dresser, MD [facility] None    ATF pt CAO x4 laying on the couch taking a nap. Pt stated that his right shoulder and arm has been hurting for about "a week or two".  Pt stated that when he moves his arm up or down and when he grasp something, his arm hurts.  I advised him to go to an urgent care due to not having a pcp.  We also talked about him finding a pcp.  He stated that he is currently waiting for his medicaid card to come in from [place].  He stated that he talked with the office earlier this week and he was advised that the card should be here any day this week.  Pt is also out of several medications because of the delay in receiving his card.  He has been out of hydralazine for over a week.  Pt admitted that his weight has increased 10lbs over the past two weeks.  He denies sob, chest pain and dizziness.  His vitals noted.    I called the heart failure clinic and notified them of his weight gain.  Pt was advised to take 80 mg furosemide BID for two days and he needs to schedule a f/u appointment.  I advised the pt of the same but I will call to f/u with him tomorrow to check if he followed theses instructions.   I spoke with erika and she approved him to get his meds from the out pt pharmacy until he's able to get his meds.    BP 122/90 (BP Location: Left Arm, Patient Position: Sitting, Cuff Size: Large)   Pulse 67   Resp 16   Wt 254 lb (115.2 kg)   SpO2 98%   BMI 37.51 kg/m   Weight yesterday-didn't weigh Last visit weight-245lb  **pt is out of the following: Potassium Isosorbide Hydralazine Torsemide   Timothey Dahlstrom, EMT Paramedic 05/24/2017    ACTION: Home visit completed Next visit planned for next week due to him being out of several meds

## 2017-05-29 ENCOUNTER — Telehealth: Payer: Self-pay | Admitting: *Deleted

## 2017-05-29 NOTE — Telephone Encounter (Signed)
RESEARCH TELEPHONE ENCOUNTER  Patient ID: Scott Salinas  DOB: 02/13/1971  [name] meets screening criteria for the [facility] HF study.  PI [place] aware.  Contacted subject by phone and Research appointment to review ICF and screen scheduled Jan 22 at 10:00am.

## 2017-05-30 ENCOUNTER — Encounter: Payer: Medicaid - Out of State | Admitting: *Deleted

## 2017-05-30 VITALS — BP 137/83 | HR 71 | Ht 70.0 in | Wt 255.0 lb

## 2017-05-30 DIAGNOSIS — Z006 Encounter for examination for normal comparison and control in clinical research program: Secondary | ICD-10-CM

## 2017-05-30 NOTE — Progress Notes (Signed)
GalacticInformed Consent  Subject Name: Scott Salinas  Subject met inclusion and exclusion criteria. PI [place] aware.  The informed consent form, study requirements and expectations were reviewed with the subject and questions and concerns were addressed prior to the signing of the consent form. The subject verbalized understanding of the trail requirements. The subject agreed to participate in the Galact[facility] and signed the informed consent. The informed consent was obtained prior to performance of any protocol-specific procedures for the subject. A copy of the signed informed consent was given to the subject.  Research Coordinator will contact subject once labs are received to discuss follow-up.    Duncan Dull, RN 05/30/2017

## 2017-05-31 MED FILL — TORSEMIDE 20 MG TABLET: 20 | 30 days supply | Qty: 120 | Fill #0

## 2017-05-31 MED FILL — POTASSIUM CL ER 20 MEQ TABL: 20 | 34 days supply | Qty: 102 | Fill #0

## 2017-06-01 ENCOUNTER — Other Ambulatory Visit (HOSPITAL_COMMUNITY): Payer: Self-pay

## 2017-06-01 ENCOUNTER — Encounter (HOSPITAL_COMMUNITY): Payer: Self-pay

## 2017-06-01 ENCOUNTER — Telehealth (HOSPITAL_COMMUNITY): Payer: Self-pay | Admitting: *Deleted

## 2017-06-01 DIAGNOSIS — R748 Abnormal levels of other serum enzymes: Secondary | ICD-10-CM

## 2017-06-01 NOTE — Telephone Encounter (Signed)
-----   Message from Larey Dresser, MD sent at 06/01/2017  3:13 PM EST ----- Regarding: RE: CPK lab He needs to stop atorvastatin asap.  Also needs repeat CPK next week. Any muscle pain?   ----- Message ----- From: Duncan Dull, RN Sent: 06/01/2017   2:35 PM To: Larey Dresser, MD Subject: CPK lab                                        We are trying to switch some of the Research PI signatures to co-signing notes in Epic  :)  I sent a note for you to co-sign.  The CPK lab for Scott Salinas is elevated at 2507.  He takes atorvastatin 40mg  daily.  He has an appt in the HF clinic on Feb 8th.  We received an alert about the lab from [facility] screening.

## 2017-06-01 NOTE — Telephone Encounter (Signed)
Pt aware and agreeable to stop atorvastatin and lab appt scheduled for Monday.

## 2017-06-01 NOTE — Progress Notes (Signed)
    More labs to follow.

## 2017-06-01 NOTE — Addendum Note (Signed)
Addended by: Harvie Junior on: 06/01/2017 03:52 PM   Modules accepted: Orders

## 2017-06-01 NOTE — Progress Notes (Signed)
Paramedicine Encounter    Patient ID: Scott Salinas, male    DOB: 1970/08/28, 47 y.o.   MRN: [id number]    Patient Care Team: Patient, No Pcp Per as PCP - General (General Practice)  Patient Active Problem List   Diagnosis Date Noted  . HLD (hyperlipidemia) 02/12/2017  . Tobacco abuse 02/12/2017  . Obesity (BMI 30-39.9) 02/12/2017  . CAD (coronary artery disease)   . Acute on chronic combined systolic and diastolic CHF (congestive heart failure) ([place]) 05/28/2016  . Prolonged Q-T interval on ECG 03/03/2016  . Right leg swelling 12/29/2015  . Drug abuse ([place]) 12/29/2015  . 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 Chronic narcotic use 07/26/2015  . Leg pain, right 07/26/2015  . 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 CKD (chronic kidney disease) stage 3, GFR 30-59 ml/min (HCC)   . Coronary artery disease due to lipid rich plaque   . Accelerated hypertension 12/27/2014  . History of noncompliance with medical treatment 12/27/2014    Current Outpatient Medications:  .  amLODipine (NORVASC) 10 MG tablet, Take 1 tablet (10 mg total) by mouth daily., Disp: 30 tablet, Rfl: 3 .  atorvastatin (LIPITOR) 40 MG tablet, Take 1 tablet (40 mg total) by mouth daily at 6 PM., Disp: 30 tablet, Rfl: 6 .  carvedilol (COREG) 12.5 MG tablet, Take 1.5 tablets (18.75 mg total) 2 (two) times daily with a meal by mouth., Disp: 90 tablet, Rfl: 6 .  hydrALAZINE (APRESOLINE) 50 MG tablet, Take 1 tablet (50 mg total) by mouth 3 (three) times daily., Disp: 90 tablet, Rfl: 1 .  isosorbide mononitrate (IMDUR) 60 MG 24 hr tablet, Take 1 tablet (60 mg total) by mouth daily., Disp: 30 tablet, Rfl: 1 .  potassium chloride SA (K-DUR,KLOR-CON) 20 MEQ tablet, Take 2 tabs in AM and 1 tab in PM, Disp: 90 tablet, Rfl: 3 .  spironolactone (ALDACTONE) 25 MG tablet, Take 0.5 tablets (12.5 mg total) by mouth daily., Disp: 30 tablet, Rfl: 0 .  torsemide (DEMADEX) 20 MG tablet, Take 2 tablets (40 mg  total) by mouth 2 (two) times daily., Disp: 120 tablet, Rfl: 3 .  aspirin 81 MG EC tablet, Take 1 tablet (81 mg total) by mouth daily., Disp: 30 tablet, Rfl: 0 .  docusate sodium (COLACE) 100 MG capsule, Take 1 capsule (100 mg total) by mouth 2 (two) times daily. (Patient not taking: Reported on 03/15/2017), Disp: 10 capsule, Rfl: 0 .  nitroGLYCERIN (NITROSTAT) 0.4 MG SL tablet, Place 1 tablet (0.4 mg total) every 5 (five) minutes as needed under the tongue for chest pain., Disp: 25 tablet, Rfl: 0 Allergies  Allergen Reactions  . Lisinopril Anaphylaxis    Whole right side of face swelled.      Social History   Socioeconomic History  . Marital status: Single    Spouse name: Not on file  . Number of children: Not on file  . Years of education: Not on file  . Highest education level: Not on file  Social Needs  . Financial resource strain: Not on file  . Food insecurity - worry: Not on file  . Food insecurity - inability: Not on file  . Transportation needs - medical: Not on file  . Transportation needs - non-medical: Not on file  Occupational History  . Occupation: Disabled  Tobacco Use  . Smoking status: Current Every Day Smoker    Packs/day: 0.50    Years: 20.00    Pack years: 10.00    Types:  Cigarettes  . Smokeless tobacco: Never Used  Substance and Sexual Activity  . Alcohol use: No    Alcohol/week: 0.0 oz  . Drug use: Yes    Types: Marijuana    Comment: 12/30/2015 "have smoked it twice in last 3-4 months"  . Sexual activity: Not on file  Other Topics Concern  . Not on file  Social History Narrative   Pt lives with girlfriend    Physical Exam  Pulmonary/Chest: No respiratory distress.  Abdominal: He exhibits no distension. There is no tenderness.  Musculoskeletal: He exhibits no edema.  Skin: Skin is warm and dry. He is not diaphoretic.        Future Appointments  Date Time Provider [place]  06/15/2017  2:20 PM Larey Dresser, MD [facility] None     ATF pt CAO x4 sitting in the kitchen.  Pt stated that he still have mild right shoulder pain that hurts when he moves it. He has taken all of his medications from the pill box this week including the hydralazine. He stated that he forgets to take the hydralazine sometimes during the day because he's on the go so much, but once he gets home he takes it.  Pt denies sob, dizziness and chest pain.  Pt participating in the study beginning this week and he has no questions at this time.  He cooked and ate roast yesterday and he continues to not eat foods high in sodium.  Pt remains active throughout the day. rx bottles verified and pill box refilled.   His medicaid card came in the mail last week and he has a copy on his phone.  His mother will be mailing it to him this week. He no longer needs [facility].   His bp is elevated and he stated that he hadn't taken his medications yet.  He took his medications during our visit.   BP (!) 132/100 (BP Location: Left Arm, Patient Position: Sitting, Cuff Size: Large)   Pulse 68   Wt 251 lb (113.9 kg)   SpO2 98%   BMI 36.01 kg/m   Weight yesterday-didn't weigh Last visit weight-254    Chloey Ricard, EMT Paramedic 06/01/2017    ACTION: Home visit completed

## 2017-06-04 ENCOUNTER — Ambulatory Visit (HOSPITAL_COMMUNITY)
Admission: RE | Admit: 2017-06-04 | Discharge: 2017-06-04 | Disposition: A | Discharge status: Home / Self Care | Payer: Medicaid Other | Source: Ambulatory Visit | Attending: Internal Medicine | Admitting: Internal Medicine

## 2017-06-04 ENCOUNTER — Telehealth: Payer: Self-pay | Admitting: *Deleted

## 2017-06-04 ENCOUNTER — Other Ambulatory Visit (HOSPITAL_COMMUNITY): Payer: Self-pay

## 2017-06-04 DIAGNOSIS — R748 Abnormal levels of other serum enzymes: Secondary | ICD-10-CM | POA: Diagnosis not present

## 2017-06-04 DIAGNOSIS — I5042 Chronic combined systolic (congestive) and diastolic (congestive) heart failure: Secondary | ICD-10-CM

## 2017-06-04 LAB — CK: Total CK: 2346 U/L — ABNORMAL HIGH (ref 49–397)

## 2017-06-04 NOTE — Telephone Encounter (Signed)
Notes recorded by Shirley Muscat, RN on 06/04/2017 at 4:49 PM EST Disregard Labs drawn today (if we do not call you, then your lab work was stable) , but Lab appointment made (orders placed)   ------  Notes recorded by Shirley Muscat, RN on 06/04/2017 at 4:49 PM EST Pt aware of results, pt denies CP, and Labs drawn today (if we do not call you, then your lab work was stable)   ------  Notes recorded by Larey Dresser, MD on 06/04/2017 at 12:59 PM EST Please also make sure that he is not having chest pain and send a troponin level please. Order stat today. ------  Notes recorded by Larey Dresser, MD on 06/04/2017 at 12:56 PM EST CK remains high => interestingly, 2 years ago CK was high also. Ask about muscle pain. Needs full BMET to be done. Needs office followup. Make sure he is no longer on statin.

## 2017-06-04 NOTE — Telephone Encounter (Signed)
RESEARCH ENCOUNTER  Patient ID: Scott Salinas  DOB: 01/25/1950  [name] not qualify for the [facility] Failure research trial based on protocol 108 exclusion criteria. Subject's BNP 79.5 pg/mL  Contacted subject via phone.  Subject verbalized understanding.

## 2017-06-05 ENCOUNTER — Other Ambulatory Visit (HOSPITAL_COMMUNITY): Payer: Medicaid - Out of State

## 2017-06-15 ENCOUNTER — Encounter (HOSPITAL_COMMUNITY): Payer: Self-pay | Admitting: Cardiology

## 2017-06-15 ENCOUNTER — Other Ambulatory Visit (HOSPITAL_COMMUNITY): Payer: Self-pay

## 2017-06-15 ENCOUNTER — Ambulatory Visit (HOSPITAL_COMMUNITY)
Admission: RE | Admit: 2017-06-15 | Discharge: 2017-06-15 | Disposition: A | Discharge status: Home / Self Care | Payer: Medicaid Other | Source: Ambulatory Visit | Attending: Cardiology | Admitting: Cardiology

## 2017-06-15 ENCOUNTER — Other Ambulatory Visit: Payer: Self-pay

## 2017-06-15 VITALS — BP 164/80 | HR 87 | Wt 256.2 lb

## 2017-06-15 DIAGNOSIS — I10 Essential (primary) hypertension: Secondary | ICD-10-CM | POA: Diagnosis not present

## 2017-06-15 DIAGNOSIS — I5042 Chronic combined systolic (congestive) and diastolic (congestive) heart failure: Secondary | ICD-10-CM | POA: Diagnosis not present

## 2017-06-15 DIAGNOSIS — R748 Abnormal levels of other serum enzymes: Secondary | ICD-10-CM | POA: Diagnosis not present

## 2017-06-15 DIAGNOSIS — I5022 Chronic systolic (congestive) heart failure: Secondary | ICD-10-CM

## 2017-06-15 DIAGNOSIS — E7849 Other hyperlipidemia: Secondary | ICD-10-CM

## 2017-06-15 DIAGNOSIS — E785 Hyperlipidemia, unspecified: Secondary | ICD-10-CM | POA: Diagnosis not present

## 2017-06-15 DIAGNOSIS — I255 Ischemic cardiomyopathy: Secondary | ICD-10-CM | POA: Diagnosis not present

## 2017-06-15 DIAGNOSIS — Z8249 Family history of ischemic heart disease and other diseases of the circulatory system: Secondary | ICD-10-CM | POA: Insufficient documentation

## 2017-06-15 DIAGNOSIS — Z951 Presence of aortocoronary bypass graft: Secondary | ICD-10-CM | POA: Insufficient documentation

## 2017-06-15 DIAGNOSIS — N183 Chronic kidney disease, stage 3 unspecified: Secondary | ICD-10-CM

## 2017-06-15 DIAGNOSIS — Z79899 Other long term (current) drug therapy: Secondary | ICD-10-CM | POA: Insufficient documentation

## 2017-06-15 DIAGNOSIS — I25810 Atherosclerosis of coronary artery bypass graft(s) without angina pectoris: Secondary | ICD-10-CM | POA: Insufficient documentation

## 2017-06-15 DIAGNOSIS — Z7982 Long term (current) use of aspirin: Secondary | ICD-10-CM | POA: Insufficient documentation

## 2017-06-15 DIAGNOSIS — I130 Hypertensive heart and chronic kidney disease with heart failure and stage 1 through stage 4 chronic kidney disease, or unspecified chronic kidney disease: Secondary | ICD-10-CM | POA: Insufficient documentation

## 2017-06-15 DIAGNOSIS — F17210 Nicotine dependence, cigarettes, uncomplicated: Secondary | ICD-10-CM | POA: Insufficient documentation

## 2017-06-15 LAB — BASIC METABOLIC PANEL
ANION GAP: 13 (ref 5–15)
BUN: 15 mg/dL (ref 6–20)
CALCIUM: 9.7 mg/dL (ref 8.9–10.3)
CO2: 25 mmol/L (ref 22–32)
CREATININE: 1.99 mg/dL — AB (ref 0.61–1.24)
Chloride: 98 mmol/L — ABNORMAL LOW (ref 101–111)
GFR calc non Af Amer: 39 mL/min — ABNORMAL LOW (ref >60)
GFR, EST AFRICAN AMERICAN: 45 mL/min — AB (ref >60)
GLUCOSE: 82 mg/dL (ref 65–99)
Potassium: 3.4 mmol/L — ABNORMAL LOW (ref 3.5–5.1)
Sodium: 136 mmol/L (ref 135–145)

## 2017-06-15 LAB — CK: CK TOTAL: 1971 U/L — AB (ref 49–397)

## 2017-06-15 NOTE — Progress Notes (Signed)
Paramedicine Encounter   Patient ID: Scott Salinas , male,   DOB: [birth year],46 y.o.,  MRN: [id number]   Met patient in clinic today with provider Amy.   Pt is currently talking with Amy and he admits to not taking his med as prescribed.  He stated that he had a lot of personal issues going on this lately.  Pt stated that he had been skipping doses but he will get back on track after this visit.  Pt has no complaints at this time.    Amy gave him a referrall to the lipid clinic due to his chlorestrol levels and him being taken off of atorvastatin.     Time spent with patient 81mns     rx change during visit: Stop atorvastatin  Zonya Gudger, EMT-Paramedic 06/15/2017   ACTION: Next visit planned for in two weeks
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
## 2017-06-15 NOTE — Patient Instructions (Signed)
Labs drawn today (if we do not call you, then your lab work was stable)   Your physician recommends that you schedule a follow-up appointment in: 6 weeks with NP/AP

## 2017-06-26 ENCOUNTER — Other Ambulatory Visit (HOSPITAL_COMMUNITY): Payer: Self-pay

## 2017-06-26 NOTE — Progress Notes (Signed)
Paramedicine Encounter    Patient ID: Scott Salinas, male    DOB: May 12, 1970, 47 y.o.   MRN: [id number]    Patient Care Team: Patient, No Pcp Per as PCP - General (General Practice)  Patient Active Problem List   Diagnosis Date Noted  . HLD (hyperlipidemia) 02/12/2017  . Tobacco abuse 02/12/2017  . Obesity (BMI 30-39.9) 02/12/2017  . CAD (coronary artery disease)   . Acute on chronic combined systolic and diastolic CHF (congestive heart failure) ([place]) 05/28/2016  . Prolonged Q-T interval on ECG 03/03/2016  . Right leg swelling 12/29/2015  . Drug abuse ([place]) 12/29/2015  . 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 Chronic narcotic use 07/26/2015  . Leg pain, right 07/26/2015  . 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 CKD (chronic kidney disease) stage 3, GFR 30-59 ml/min (HCC)   . Coronary artery disease due to lipid rich plaque   . Accelerated hypertension 12/27/2014  . History of noncompliance with medical treatment 12/27/2014    Current Outpatient Medications:  .  aspirin 81 MG EC tablet, Take 1 tablet (81 mg total) by mouth daily., Disp: 30 tablet, Rfl: 0 .  hydrALAZINE (APRESOLINE) 50 MG tablet, Take 1 tablet (50 mg total) by mouth 3 (three) times daily., Disp: 90 tablet, Rfl: 1 .  isosorbide mononitrate (IMDUR) 60 MG 24 hr tablet, Take 1 tablet (60 mg total) by mouth daily., Disp: 30 tablet, Rfl: 1 .  potassium chloride SA (K-DUR,KLOR-CON) 20 MEQ tablet, Take 2 tabs in AM and 1 tab in PM, Disp: 90 tablet, Rfl: 3 .  spironolactone (ALDACTONE) 25 MG tablet, Take 0.5 tablets (12.5 mg total) by mouth daily., Disp: 30 tablet, Rfl: 0 .  torsemide (DEMADEX) 20 MG tablet, Take 2 tablets (40 mg total) by mouth 2 (two) times daily., Disp: 120 tablet, Rfl: 3 .  carvedilol (COREG) 12.5 MG tablet, Take 1.5 tablets (18.75 mg total) 2 (two) times daily with a meal by mouth., Disp: 90 tablet, Rfl: 6 .  docusate sodium (COLACE) 100 MG capsule, Take 1 capsule (100 mg  total) by mouth 2 (two) times daily., Disp: 10 capsule, Rfl: 0 .  nitroGLYCERIN (NITROSTAT) 0.4 MG SL tablet, Place 1 tablet (0.4 mg total) every 5 (five) minutes as needed under the tongue for chest pain., Disp: 25 tablet, Rfl: 0 Allergies  Allergen Reactions  . Lisinopril Anaphylaxis    Whole right side of face swelled.      Social History   Socioeconomic History  . Marital status: Single    Spouse name: Not on file  . Number of children: Not on file  . Years of education: Not on file  . Highest education level: Not on file  Social Needs  . Financial resource strain: Not on file  . Food insecurity - worry: Not on file  . Food insecurity - inability: Not on file  . Transportation needs - medical: Not on file  . Transportation needs - non-medical: Not on file  Occupational History  . Occupation: Disabled  Tobacco Use  . Smoking status: Current Every Day Smoker    Packs/day: 0.50    Years: 20.00    Pack years: 10.00    Types: Cigarettes  . Smokeless tobacco: Never Used  Substance and Sexual Activity  . Alcohol use: No    Alcohol/week: 0.0 oz  . Drug use: Yes    Types: Marijuana    Comment: 12/30/2015 "have smoked it twice in last 3-4 months"  . Sexual activity: Not  on file  Other Topics Concern  . Not on file  Social History Narrative   Pt lives with girlfriend    Physical Exam  Pulmonary/Chest: No respiratory distress.  Abdominal: He exhibits no distension.  Musculoskeletal: He exhibits no edema.  Skin: Skin is warm and dry. He is not diaphoretic.        Future Appointments  Date Time Provider [place]  07/03/2017 10:30 AM [facility] PHARMACIST [facility]  07/27/2017 11:00 AM [facility] PA/NP [facility] None    ATF pt CAO x4 sitting in his friends house with no complaints.  Pt denies sob, chest pain and dizziness.  Pt stated that he hasn't been weighing daily, only when I see him.  Pt stated that he is watching what he eats and his fluid  intake.  Pt has taken all of his meds in both pill boxes.  rx bottles verified and pill box refilled.    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

## 2017-07-03 ENCOUNTER — Ambulatory Visit (INDEPENDENT_AMBULATORY_CARE_PROVIDER_SITE_OTHER): Payer: Medicaid Other | Admitting: Pharmacist

## 2017-07-03 ENCOUNTER — Encounter: Payer: Self-pay | Admitting: Pharmacist

## 2017-07-03 DIAGNOSIS — E785 Hyperlipidemia, unspecified: Secondary | ICD-10-CM

## 2017-07-03 LAB — HEPATIC FUNCTION PANEL
ALT: 50 IU/L — AB (ref 0–44)
AST: 52 IU/L — ABNORMAL HIGH (ref 0–40)
Albumin: 4.4 g/dL (ref 3.5–5.5)
Alkaline Phosphatase: 101 IU/L (ref 39–117)
Bilirubin Total: 0.4 mg/dL (ref 0.0–1.2)
Bilirubin, Direct: 0.12 mg/dL (ref 0.00–0.40)
TOTAL PROTEIN: 8.3 g/dL (ref 6.0–8.5)

## 2017-07-03 LAB — LIPID PANEL
CHOL/HDL RATIO: 5 ratio (ref 0.0–5.0)
Cholesterol, Total: 225 mg/dL — ABNORMAL HIGH (ref 100–199)
HDL: 45 mg/dL (ref >39)
LDL CALC: 135 mg/dL — AB (ref 0–99)
TRIGLYCERIDES: 223 mg/dL — AB (ref 0–149)
VLDL CHOLESTEROL CAL: 45 mg/dL — AB (ref 5–40)

## 2017-07-03 LAB — CK: CK TOTAL: 1695 U/L — AB (ref 24–204)

## 2017-07-03 NOTE — Patient Instructions (Addendum)
We will get labs today CK, lipid panel, and hepatic panel. Pending results will send for coverage of Repatha.     Cholesterol Cholesterol is a fat. Your body needs a small amount of cholesterol. Cholesterol (plaque) may build up in your blood vessels (arteries). That makes you more likely to have a heart attack or stroke. You cannot feel your cholesterol level. Having a blood test is the only way to find out if your level is high. Keep your test results. Work with your doctor to keep your cholesterol at a good level. What do the results mean?  Total cholesterol is how much cholesterol is in your blood.  LDL is bad cholesterol. This is the type that can build up. Try to have low LDL.  HDL is good cholesterol. It cleans your blood vessels and carries LDL away. Try to have high HDL.  Triglycerides are fat that the body can store or burn for energy. What are good levels of cholesterol?  Total cholesterol below 200.  LDL below 100 is good for people who have health risks. LDL below 70 is good for people who have very high risks.  HDL above 40 is good. It is best to have HDL of 60 or higher.  Triglycerides below 150. How can I lower my cholesterol? Diet Follow your diet program as told by your doctor.  Choose fish, white meat chicken, or [place] that is roasted or baked. Try not to eat red meat, fried foods, sausage, or lunch meats.  Eat lots of fresh fruits and vegetables.  Choose whole grains, beans, pasta, potatoes, and cereals.  Choose olive oil, corn oil, or canola oil. Only use small amounts.  Try not to eat butter, mayonnaise, shortening, or palm kernel oils.  Try not to eat foods with trans fats.  Choose low-fat or nonfat dairy foods. ? Drink skim or nonfat milk. ? Eat low-fat or nonfat yogurt and cheeses. ? Try not to drink whole milk or cream. ? Try not to eat ice cream, egg yolks, or full-fat cheeses.  Healthy desserts include angel food cake, ginger snaps, animal  crackers, hard candy, popsicles, and low-fat or nonfat frozen yogurt. Try not to eat pastries, cakes, pies, and cookies.  Exercise Follow your exercise program as told by your doctor.  Be more active. Try gardening, walking, and taking the stairs.  Ask your doctor about ways that you can be more active.  Medicine  Take over-the-counter and prescription medicines only as told by your doctor. This information is not intended to replace advice given to you by your health care provider. Make sure you discuss any questions you have with your health care provider. Document Released: 07/21/2008 Document Revised: 11/24/2015 Document Reviewed: 11/04/2015 Elsevier Interactive Patient Education  [employer].
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
## 2017-07-05 ENCOUNTER — Telehealth: Payer: Self-pay | Admitting: Pharmacist

## 2017-07-05 DIAGNOSIS — E782 Mixed hyperlipidemia: Secondary | ICD-10-CM

## 2017-07-05 MED ORDER — EVOLOCUMAB 140 MG/ML ~~LOC~~ SOAJ: 140.0000 mg | SUBCUTANEOUS | 3 refills | Status: DC

## 2017-07-05 NOTE — Telephone Encounter (Signed)
Pt approved for Repatha. Per letter insurance requires [employer]. Rx has been sent. Pt aware and would prefer to do first injection in office. He will call once he receives medication.

## 2017-07-12 ENCOUNTER — Other Ambulatory Visit (HOSPITAL_COMMUNITY): Payer: Self-pay | Admitting: *Deleted

## 2017-07-12 ENCOUNTER — Other Ambulatory Visit (HOSPITAL_COMMUNITY): Payer: Self-pay | Admitting: Pharmacist

## 2017-07-12 MED ORDER — SPIRONOLACTONE 25 MG PO TABS: 12.5000 mg | ORAL_TABLET | Freq: Every day | ORAL | 3 refills | Status: DC

## 2017-07-12 MED ORDER — TORSEMIDE 20 MG PO TABS: 40.0000 mg | ORAL_TABLET | Freq: Two times a day (BID) | ORAL | 3 refills | Status: DC

## 2017-07-12 MED ORDER — CARVEDILOL 12.5 MG PO TABS: 18.7500 mg | ORAL_TABLET | Freq: Two times a day (BID) | ORAL | 6 refills | Status: DC

## 2017-07-12 MED ORDER — POTASSIUM CHLORIDE CRYS ER 20 MEQ PO TBCR: EXTENDED_RELEASE_TABLET | ORAL | 3 refills | Status: DC

## 2017-07-12 MED ORDER — ISOSORBIDE MONONITRATE ER 60 MG PO TB24: 60.0000 mg | ORAL_TABLET | Freq: Every day | ORAL | 1 refills | Status: DC

## 2017-07-12 MED ORDER — HYDRALAZINE HCL 50 MG PO TABS: 50.0000 mg | ORAL_TABLET | Freq: Three times a day (TID) | ORAL | 5 refills | Status: DC

## 2017-07-13 ENCOUNTER — Telehealth (HOSPITAL_COMMUNITY): Payer: Self-pay | Admitting: Cardiology

## 2017-07-13 ENCOUNTER — Telehealth (HOSPITAL_COMMUNITY): Payer: Self-pay

## 2017-07-13 MED FILL — TORSEMIDE 20 MG TABLET: 20 | 30 days supply | Qty: 120 | Fill #1

## 2017-07-13 MED FILL — hydrALAZINE HCL 50 MG TABS: 50 | 34 days supply | Qty: 102 | Fill #0

## 2017-07-13 MED FILL — CARVEDILOL 12.5 MG TABLET: 12.5 | 34 days supply | Qty: 102 | Fill #0

## 2017-07-13 MED FILL — ISOSORBIDE MN ER 60 MG TAB: 60 | 34 days supply | Qty: 34 | Fill #0

## 2017-07-13 MED FILL — SPIRONOLACTONE 25 MG TABLET: 25 | 34 days supply | Qty: 17 | Fill #0

## 2017-07-13 MED FILL — KLOR-CON M20 TABLET: 20 | 34 days supply | Qty: 102 | Fill #1

## 2017-07-13 NOTE — Telephone Encounter (Signed)
Dee with [facility] called with

## 2017-07-13 NOTE — Telephone Encounter (Signed)
Dee with [place] [facility] para [facility] called during patients home visit to report patient does not have prescription med coverage. Reports patient has out of state medicaid and is having a difficult time getting a pharmacy to fill scripts. She requested that we send an order to the outpatient pharmacy with the heart failure fund until she is able to iron out medication script coverage.  All medications cardiac in nature sent to pharmacy and message to HF pharmD for further assistance.

## 2017-07-16 ENCOUNTER — Telehealth: Payer: Self-pay | Admitting: Licensed Clinical Social Worker

## 2017-07-16 NOTE — Telephone Encounter (Signed)
CSW referred to assist with prescriptions as patient has out of state medicaid. CSW contacted patient who states plans to return to [place] within the next few days. CSW encourage patient to return call if he should decide to remain here in [place]. CSW available as needed. Raquel Sarna, [place], [place]

## 2017-07-17 ENCOUNTER — Telehealth (HOSPITAL_COMMUNITY): Payer: Self-pay

## 2017-07-17 NOTE — Telephone Encounter (Signed)
I called pt to schedule our appointment for this week. No answer I will f/u with him tomorrow.

## 2017-07-17 NOTE — Telephone Encounter (Signed)
Pt stated that he has not been able to pick up his meds.  Pt is currently out of "most of his medications".  Pt has out of stated medicaid and has been having issues with getting a pharmacy to accept his insurance.  [facility] to the [facility] [address] where they filled his prescriptions recently and they stated the same thing.  After several attempts with other pharmacies I asked Doroteo Bradford with the heart failure clinic if he could get the meds from [facility].  Pt sent his girlfriend to pick up his meds.  CHP appointment rescheduled for next week.

## 2017-07-18 ENCOUNTER — Telehealth (HOSPITAL_COMMUNITY): Payer: Self-pay

## 2017-07-19 ENCOUNTER — Telehealth (HOSPITAL_COMMUNITY): Payer: Self-pay

## 2017-07-19 NOTE — Telephone Encounter (Signed)
I called and text pt and his girlfriend about CHP visit this week.  I didn't get a call back or response from either one.  I will f/u with them Friday.

## 2017-07-25 NOTE — Telephone Encounter (Signed)
Pt was call and text to schedule an appointment but he hasn't returned either calls or texts.

## 2017-07-26 NOTE — Addendum Note (Signed)
Addended by: SUPPLE, MEGAN E on: 07/26/2017 11:46 AM   Modules accepted: Orders

## 2017-07-26 NOTE — Telephone Encounter (Signed)
Called pt to see if he had started on Repatha injections yet. States he has done 2 of his injections. Provided him with the specialty pharmacy number so that he can contact them for refills. Scheduled f/u labwork to assess efficacy of Repatha.

## 2017-07-27 ENCOUNTER — Encounter (HOSPITAL_COMMUNITY): Payer: Medicaid - Out of State

## 2017-08-02 ENCOUNTER — Telehealth (HOSPITAL_COMMUNITY): Payer: Self-pay

## 2017-08-07 NOTE — Telephone Encounter (Signed)
I called pt to schedule a CHP visit.  It's been several weeks since we've visit.  Pt stated that he was in [place] and will not be back to [place] until the weekend.  I advised pt that I will f/u with him next week.

## 2017-08-10 ENCOUNTER — Telehealth (HOSPITAL_COMMUNITY): Payer: Self-pay

## 2017-08-10 MED FILL — hydrALAZINE HCL 50 MG TABS: 50 | 34 days supply | Qty: 102 | Fill #0

## 2017-08-10 MED FILL — ISOSORBIDE MN ER 60 MG TAB: 60 | 34 days supply | Qty: 34 | Fill #0

## 2017-08-10 MED FILL — SPIRONOLACTONE 25 MG TABLET: 25 | 34 days supply | Qty: 17 | Fill #0

## 2017-08-10 MED FILL — POTASSIUM CL ER 20 MEQ TABL: 20 | 34 days supply | Qty: 102 | Fill #0

## 2017-08-10 NOTE — Telephone Encounter (Signed)
Pt called to be informed that Bevelyn Buckles from the [facility] failure clinic will be sending paperwork to his home to fill out for medication assistance program.  Pt stated that he was supposed to get the repatha sureclick injection yesterday but has gotten the run around when it comes to getting the medication.  Pt and I agree to meet next week.

## 2017-08-17 ENCOUNTER — Telehealth: Payer: Self-pay | Admitting: Pharmacist

## 2017-08-17 NOTE — Telephone Encounter (Signed)
Spoke with patient who reports that insurance is still the same and copay is affordable. He reports that he was unable to get a refill because the online refill portal did not work. Advised he call the pharmacy to set up shipment. Provided him the phone number. He is aware to call back if he has any issues getting medications.

## 2017-08-21 ENCOUNTER — Telehealth (HOSPITAL_COMMUNITY): Payer: Self-pay

## 2017-08-21 NOTE — Telephone Encounter (Signed)
I called Mr. Thebeau to schedule our last CHP visit.  Pt was advised that he has done very well since the beginning of our visits and that I felt comfortable with D/C him from the [facility] program.  Pt stated that he would like to have more visits, but he had to call me back.  I will f/u with Kennyth Lose and [facility] tomorrow during [facility] visit for further.

## 2017-08-27 ENCOUNTER — Other Ambulatory Visit: Payer: Medicaid - Out of State

## 2017-08-28 ENCOUNTER — Telehealth: Payer: Self-pay | Admitting: Pharmacist

## 2017-08-28 NOTE — Telephone Encounter (Signed)
Pt missed labs yesterday to check lipids since starting Repatha. LMOM for pt to reschedule labs.

## 2017-08-30 NOTE — Telephone Encounter (Signed)
Called pt who states he has only had 2 injections so far because the pharmacy did not send him any more. Advised pt he needs to call the pharmacy to set up shipments every month. Moved out lab work to early June.

## 2017-09-12 ENCOUNTER — Telehealth (HOSPITAL_COMMUNITY): Payer: Self-pay | Admitting: Surgery

## 2017-09-12 NOTE — Telephone Encounter (Signed)
Scott Salinas will be discharged at this time from the [facility] secondary to successful completion and ability to independently manage his HF at home.  He will continue to follow in the [facility] Clinic.

## 2017-10-08 ENCOUNTER — Encounter (INDEPENDENT_AMBULATORY_CARE_PROVIDER_SITE_OTHER): Payer: Self-pay

## 2017-10-08 ENCOUNTER — Other Ambulatory Visit: Payer: Medicaid Other

## 2017-10-09 ENCOUNTER — Other Ambulatory Visit: Payer: Self-pay | Admitting: Pharmacist

## 2017-10-09 DIAGNOSIS — E785 Hyperlipidemia, unspecified: Secondary | ICD-10-CM

## 2017-10-10 ENCOUNTER — Other Ambulatory Visit: Payer: Medicaid Other | Admitting: *Deleted

## 2017-10-10 DIAGNOSIS — E785 Hyperlipidemia, unspecified: Secondary | ICD-10-CM

## 2017-10-11 LAB — LIPID PANEL
CHOLESTEROL TOTAL: 149 mg/dL (ref 100–199)
Chol/HDL Ratio: 3.1 ratio (ref 0.0–5.0)
HDL: 48 mg/dL (ref >39)
LDL Calculated: 84 mg/dL (ref 0–99)
TRIGLYCERIDES: 83 mg/dL (ref 0–149)
VLDL Cholesterol Cal: 17 mg/dL (ref 5–40)

## 2017-10-11 LAB — HEPATIC FUNCTION PANEL
ALBUMIN: 3.9 g/dL (ref 3.5–5.5)
ALK PHOS: 90 IU/L (ref 39–117)
ALT: 41 IU/L (ref 0–44)
AST: 45 IU/L — AB (ref 0–40)
Bilirubin Total: 0.5 mg/dL (ref 0.0–1.2)
Bilirubin, Direct: 0.14 mg/dL (ref 0.00–0.40)
Total Protein: 7.3 g/dL (ref 6.0–8.5)

## 2017-10-12 MED FILL — TORSEMIDE 20 MG TABLET: 20 | 30 days supply | Qty: 120 | Fill #2

## 2017-10-12 MED FILL — CARVEDILOL 12.5 MG TABLET: 12.5 | 34 days supply | Qty: 102 | Fill #1

## 2017-10-15 ENCOUNTER — Encounter (HOSPITAL_COMMUNITY): Payer: Self-pay

## 2017-12-25 ENCOUNTER — Other Ambulatory Visit (HOSPITAL_COMMUNITY): Payer: Self-pay | Admitting: Cardiology

## 2017-12-25 MED FILL — TORSEMIDE 20 MG TABLET: 20 | 25 days supply | Qty: 100 | Fill #0

## 2017-12-25 MED FILL — POTASSIUM CL ER 20 MEQ TAB: 20 | 34 days supply | Qty: 102 | Fill #0

## 2017-12-25 MED FILL — CARVEDILOL 12.5 MG TABLET: 12.5 | 30 days supply | Qty: 90 | Fill #0

## 2017-12-25 MED FILL — SPIRONOLACTONE 25 MG TABLET: 25 | 34 days supply | Qty: 17 | Fill #1

## 2017-12-25 MED FILL — ISOSORBIDE MN ER 60 MG TAB: 60 | 34 days supply | Qty: 34 | Fill #0

## 2017-12-25 MED FILL — hydrALAZINE HCL 50 MG TABS: 50 | 34 days supply | Qty: 102 | Fill #1

## 2018-01-01 ENCOUNTER — Encounter (HOSPITAL_COMMUNITY): Payer: Medicaid Other

## 2018-02-08 ENCOUNTER — Emergency Department (HOSPITAL_COMMUNITY): Payer: Medicaid Other

## 2018-02-08 ENCOUNTER — Observation Stay (HOSPITAL_COMMUNITY)
Admission: EM | Admit: 2018-02-08 | Discharge: 2018-02-10 | Disposition: A | Discharge status: Home / Self Care | Payer: Medicaid Other | Attending: Internal Medicine | Admitting: Internal Medicine

## 2018-02-08 ENCOUNTER — Encounter (HOSPITAL_COMMUNITY): Payer: Self-pay | Admitting: Emergency Medicine

## 2018-02-08 DIAGNOSIS — R9431 Abnormal electrocardiogram [ECG] [EKG]: Secondary | ICD-10-CM

## 2018-02-08 DIAGNOSIS — F419 Anxiety disorder, unspecified: Secondary | ICD-10-CM | POA: Diagnosis not present

## 2018-02-08 DIAGNOSIS — G4730 Sleep apnea, unspecified: Secondary | ICD-10-CM | POA: Diagnosis not present

## 2018-02-08 DIAGNOSIS — R7989 Other specified abnormal findings of blood chemistry: Secondary | ICD-10-CM | POA: Diagnosis not present

## 2018-02-08 DIAGNOSIS — Z7982 Long term (current) use of aspirin: Secondary | ICD-10-CM | POA: Diagnosis not present

## 2018-02-08 DIAGNOSIS — F1910 Other psychoactive substance abuse, uncomplicated: Secondary | ICD-10-CM | POA: Diagnosis present

## 2018-02-08 DIAGNOSIS — N183 Chronic kidney disease, stage 3 unspecified: Secondary | ICD-10-CM | POA: Diagnosis present

## 2018-02-08 DIAGNOSIS — Z8249 Family history of ischemic heart disease and other diseases of the circulatory system: Secondary | ICD-10-CM | POA: Insufficient documentation

## 2018-02-08 DIAGNOSIS — Z9114 Patient's other noncompliance with medication regimen: Secondary | ICD-10-CM | POA: Diagnosis not present

## 2018-02-08 DIAGNOSIS — I255 Ischemic cardiomyopathy: Secondary | ICD-10-CM | POA: Diagnosis not present

## 2018-02-08 DIAGNOSIS — Z951 Presence of aortocoronary bypass graft: Secondary | ICD-10-CM | POA: Insufficient documentation

## 2018-02-08 DIAGNOSIS — N179 Acute kidney failure, unspecified: Secondary | ICD-10-CM | POA: Diagnosis not present

## 2018-02-08 DIAGNOSIS — Z79899 Other long term (current) drug therapy: Secondary | ICD-10-CM | POA: Diagnosis not present

## 2018-02-08 DIAGNOSIS — I5042 Chronic combined systolic (congestive) and diastolic (congestive) heart failure: Secondary | ICD-10-CM | POA: Diagnosis not present

## 2018-02-08 DIAGNOSIS — D631 Anemia in chronic kidney disease: Secondary | ICD-10-CM | POA: Diagnosis not present

## 2018-02-08 DIAGNOSIS — I130 Hypertensive heart and chronic kidney disease with heart failure and stage 1 through stage 4 chronic kidney disease, or unspecified chronic kidney disease: Secondary | ICD-10-CM | POA: Diagnosis not present

## 2018-02-08 DIAGNOSIS — I2510 Atherosclerotic heart disease of native coronary artery without angina pectoris: Secondary | ICD-10-CM | POA: Diagnosis not present

## 2018-02-08 DIAGNOSIS — E669 Obesity, unspecified: Secondary | ICD-10-CM | POA: Insufficient documentation

## 2018-02-08 DIAGNOSIS — R0602 Shortness of breath: Secondary | ICD-10-CM

## 2018-02-08 DIAGNOSIS — F17210 Nicotine dependence, cigarettes, uncomplicated: Secondary | ICD-10-CM | POA: Insufficient documentation

## 2018-02-08 DIAGNOSIS — I2729 Other secondary pulmonary hypertension: Secondary | ICD-10-CM | POA: Insufficient documentation

## 2018-02-08 DIAGNOSIS — E876 Hypokalemia: Secondary | ICD-10-CM | POA: Diagnosis not present

## 2018-02-08 DIAGNOSIS — N184 Chronic kidney disease, stage 4 (severe): Secondary | ICD-10-CM | POA: Insufficient documentation

## 2018-02-08 DIAGNOSIS — F1210 Cannabis abuse, uncomplicated: Secondary | ICD-10-CM | POA: Insufficient documentation

## 2018-02-08 DIAGNOSIS — Z6833 Body mass index (BMI) 33.0-33.9, adult: Secondary | ICD-10-CM | POA: Insufficient documentation

## 2018-02-08 DIAGNOSIS — Z955 Presence of coronary angioplasty implant and graft: Secondary | ICD-10-CM | POA: Insufficient documentation

## 2018-02-08 DIAGNOSIS — I252 Old myocardial infarction: Secondary | ICD-10-CM | POA: Insufficient documentation

## 2018-02-08 DIAGNOSIS — I10 Essential (primary) hypertension: Secondary | ICD-10-CM | POA: Diagnosis present

## 2018-02-08 DIAGNOSIS — I160 Hypertensive urgency: Principal | ICD-10-CM

## 2018-02-08 DIAGNOSIS — M25512 Pain in left shoulder: Secondary | ICD-10-CM

## 2018-02-08 MED ORDER — MORPHINE SULFATE (PF) 4 MG/ML IV SOLN
4.0000 mg | Freq: Once | INTRAVENOUS | Status: AC
  Administered 2018-02-08: 4 mg via INTRAVENOUS
  Filled 2018-02-08: qty 1

## 2018-02-08 MED ORDER — ASPIRIN 81 MG PO CHEW
324.0000 mg | CHEWABLE_TABLET | Freq: Once | ORAL | Status: AC
  Administered 2018-02-08: 324 mg via ORAL
  Filled 2018-02-08: qty 4

## 2018-02-08 NOTE — ED Triage Notes (Signed)
Pt reports L sided shoulder pain, radiating into neck. Reports L arm numbness. Denies injury that he can recall.
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
## 2018-02-09 ENCOUNTER — Emergency Department (HOSPITAL_COMMUNITY): Payer: Medicaid Other

## 2018-02-09 ENCOUNTER — Observation Stay (HOSPITAL_BASED_OUTPATIENT_CLINIC_OR_DEPARTMENT_OTHER): Payer: Medicaid Other

## 2018-02-09 ENCOUNTER — Other Ambulatory Visit: Payer: Self-pay

## 2018-02-09 ENCOUNTER — Encounter (HOSPITAL_COMMUNITY): Payer: Self-pay | Admitting: Radiology

## 2018-02-09 DIAGNOSIS — I5042 Chronic combined systolic (congestive) and diastolic (congestive) heart failure: Secondary | ICD-10-CM | POA: Diagnosis not present

## 2018-02-09 DIAGNOSIS — I160 Hypertensive urgency: Secondary | ICD-10-CM

## 2018-02-09 DIAGNOSIS — E876 Hypokalemia: Secondary | ICD-10-CM

## 2018-02-09 DIAGNOSIS — Z951 Presence of aortocoronary bypass graft: Secondary | ICD-10-CM

## 2018-02-09 DIAGNOSIS — I10 Essential (primary) hypertension: Secondary | ICD-10-CM | POA: Diagnosis not present

## 2018-02-09 DIAGNOSIS — M25512 Pain in left shoulder: Secondary | ICD-10-CM | POA: Diagnosis not present

## 2018-02-09 DIAGNOSIS — N183 Chronic kidney disease, stage 3 (moderate): Secondary | ICD-10-CM

## 2018-02-09 DIAGNOSIS — F1910 Other psychoactive substance abuse, uncomplicated: Secondary | ICD-10-CM

## 2018-02-09 DIAGNOSIS — I5030 Unspecified diastolic (congestive) heart failure: Secondary | ICD-10-CM

## 2018-02-09 LAB — CBC WITH DIFFERENTIAL/PLATELET
Abs Immature Granulocytes: 0 10*3/uL (ref 0.0–0.1)
Basophils Absolute: 0 10*3/uL (ref 0.0–0.1)
Basophils Relative: 0 %
EOS ABS: 0.1 10*3/uL (ref 0.0–0.7)
Eosinophils Relative: 1 %
HEMATOCRIT: 52.4 % — AB (ref 39.0–52.0)
HEMOGLOBIN: 17.2 g/dL — AB (ref 13.0–17.0)
IMMATURE GRANULOCYTES: 0 %
LYMPHS PCT: 18 %
Lymphs Abs: 1.6 10*3/uL (ref 0.7–4.0)
MCH: 29.9 pg (ref 26.0–34.0)
MCHC: 32.8 g/dL (ref 30.0–36.0)
MCV: 91 fL (ref 78.0–100.0)
Monocytes Absolute: 0.8 10*3/uL (ref 0.1–1.0)
Monocytes Relative: 9 %
NEUTROS PCT: 72 %
Neutro Abs: 6.4 10*3/uL (ref 1.7–7.7)
Platelets: 266 10*3/uL (ref 150–400)
RBC: 5.76 MIL/uL (ref 4.22–5.81)
RDW: 15.5 % (ref 11.5–15.5)
WBC: 9 10*3/uL (ref 4.0–10.5)

## 2018-02-09 LAB — CBC
HEMATOCRIT: 52.2 % — AB (ref 39.0–52.0)
Hemoglobin: 17.1 g/dL — ABNORMAL HIGH (ref 13.0–17.0)
MCH: 29.8 pg (ref 26.0–34.0)
MCHC: 32.8 g/dL (ref 30.0–36.0)
MCV: 90.9 fL (ref 78.0–100.0)
Platelets: 245 10*3/uL (ref 150–400)
RBC: 5.74 MIL/uL (ref 4.22–5.81)
RDW: 15.5 % (ref 11.5–15.5)
WBC: 8.5 10*3/uL (ref 4.0–10.5)

## 2018-02-09 LAB — TROPONIN I
Troponin I: 0.1 ng/mL (ref <0.03)
Troponin I: 0.11 ng/mL (ref <0.03)
Troponin I: 0.12 ng/mL (ref <0.03)

## 2018-02-09 LAB — I-STAT TROPONIN, ED
TROPONIN I, POC: 0.03 ng/mL (ref 0.00–0.08)
Troponin i, poc: 0 ng/mL (ref 0.00–0.08)

## 2018-02-09 LAB — COMPREHENSIVE METABOLIC PANEL
ALBUMIN: 3.6 g/dL (ref 3.5–5.0)
ALT: 35 U/L (ref 0–44)
AST: 43 U/L — AB (ref 15–41)
Alkaline Phosphatase: 77 U/L (ref 38–126)
Anion gap: 11 (ref 5–15)
BILIRUBIN TOTAL: 0.8 mg/dL (ref 0.3–1.2)
BUN: 18 mg/dL (ref 6–20)
CO2: 26 mmol/L (ref 22–32)
Calcium: 9.1 mg/dL (ref 8.9–10.3)
Chloride: 101 mmol/L (ref 98–111)
Creatinine, Ser: 2.26 mg/dL — ABNORMAL HIGH (ref 0.61–1.24)
GFR calc Af Amer: 38 mL/min — ABNORMAL LOW (ref >60)
GFR calc non Af Amer: 33 mL/min — ABNORMAL LOW (ref >60)
GLUCOSE: 99 mg/dL (ref 70–99)
POTASSIUM: 3 mmol/L — AB (ref 3.5–5.1)
Sodium: 138 mmol/L (ref 135–145)
TOTAL PROTEIN: 8.1 g/dL (ref 6.5–8.1)

## 2018-02-09 LAB — LIPASE, BLOOD: Lipase: 35 U/L (ref 11–51)

## 2018-02-09 LAB — CBG MONITORING, ED: Glucose-Capillary: 123 mg/dL — ABNORMAL HIGH (ref 70–99)

## 2018-02-09 LAB — RAPID URINE DRUG SCREEN, HOSP PERFORMED
Amphetamines: NOT DETECTED
BARBITURATES: NOT DETECTED
Benzodiazepines: NOT DETECTED
COCAINE: NOT DETECTED
Opiates: NOT DETECTED
TETRAHYDROCANNABINOL: POSITIVE — AB

## 2018-02-09 LAB — BASIC METABOLIC PANEL
Anion gap: 12 (ref 5–15)
BUN: 17 mg/dL (ref 6–20)
CO2: 22 mmol/L (ref 22–32)
Calcium: 9.2 mg/dL (ref 8.9–10.3)
Chloride: 102 mmol/L (ref 98–111)
Creatinine, Ser: 1.97 mg/dL — ABNORMAL HIGH (ref 0.61–1.24)
GFR calc Af Amer: 45 mL/min — ABNORMAL LOW (ref >60)
GFR, EST NON AFRICAN AMERICAN: 39 mL/min — AB (ref >60)
GLUCOSE: 125 mg/dL — AB (ref 70–99)
POTASSIUM: 3 mmol/L — AB (ref 3.5–5.1)
Sodium: 136 mmol/L (ref 135–145)

## 2018-02-09 LAB — MAGNESIUM: Magnesium: 1.7 mg/dL (ref 1.7–2.4)

## 2018-02-09 LAB — BRAIN NATRIURETIC PEPTIDE: B Natriuretic Peptide: 1100.8 pg/mL — ABNORMAL HIGH (ref 0.0–100.0)

## 2018-02-09 LAB — MRSA PCR SCREENING: MRSA BY PCR: NEGATIVE

## 2018-02-09 LAB — ECHOCARDIOGRAM COMPLETE
Height: 70 in
Weight: 3785.6 oz

## 2018-02-09 LAB — D-DIMER, QUANTITATIVE: D-Dimer, Quant: 0.55 ug/mL-FEU — ABNORMAL HIGH (ref 0.00–0.50)

## 2018-02-09 LAB — POTASSIUM: Potassium: 3.9 mmol/L (ref 3.5–5.1)

## 2018-02-09 MED ORDER — IOPAMIDOL (ISOVUE-370) INJECTION 76%
80.0000 mL | Freq: Once | INTRAVENOUS | Status: AC | PRN
  Administered 2018-02-09: 80 mL via INTRAVENOUS

## 2018-02-09 MED ORDER — POTASSIUM CHLORIDE CRYS ER 20 MEQ PO TBCR
40.0000 meq | EXTENDED_RELEASE_TABLET | Freq: Once | ORAL | Status: AC
  Administered 2018-02-09: 40 meq via ORAL
  Filled 2018-02-09: qty 2

## 2018-02-09 MED ORDER — IOPAMIDOL (ISOVUE-370) INJECTION 76%: 80.0000 mL | Freq: Once | INTRAVENOUS | Status: DC | PRN

## 2018-02-09 MED ORDER — HYDRALAZINE HCL 50 MG PO TABS
100.0000 mg | ORAL_TABLET | Freq: Three times a day (TID) | ORAL | Status: DC
Start: 2018-02-09 — End: 2018-02-10
  Administered 2018-02-09 (×3): 100 mg via ORAL
  Filled 2018-02-09 (×3): qty 2

## 2018-02-09 MED ORDER — LABETALOL HCL 5 MG/ML IV SOLN
20.0000 mg | INTRAVENOUS | Status: DC | PRN
  Administered 2018-02-09: 20 mg via INTRAVENOUS
  Filled 2018-02-09: qty 4

## 2018-02-09 MED ORDER — HYDROCODONE-ACETAMINOPHEN 7.5-325 MG PO TABS
1.0000 | ORAL_TABLET | Freq: Four times a day (QID) | ORAL | Status: DC | PRN
  Administered 2018-02-09 – 2018-02-10 (×4): 1 via ORAL
  Filled 2018-02-09 (×4): qty 1

## 2018-02-09 MED ORDER — POTASSIUM CHLORIDE 10 MEQ/100ML IV SOLN
10.0000 meq | INTRAVENOUS | Status: AC
  Administered 2018-02-09 (×2): 10 meq via INTRAVENOUS
  Filled 2018-02-09 (×2): qty 100

## 2018-02-09 MED ORDER — MORPHINE SULFATE (PF) 4 MG/ML IV SOLN
4.0000 mg | Freq: Once | INTRAVENOUS | Status: AC
  Administered 2018-02-09: 4 mg via INTRAVENOUS
  Filled 2018-02-09: qty 1

## 2018-02-09 MED ORDER — NITROGLYCERIN 0.4 MG SL SUBL: 0.4000 mg | SUBLINGUAL_TABLET | SUBLINGUAL | Status: DC | PRN

## 2018-02-09 MED ORDER — IOPAMIDOL (ISOVUE-370) INJECTION 76%
INTRAVENOUS | Status: AC
  Filled 2018-02-09: qty 100

## 2018-02-09 MED ORDER — NITROGLYCERIN 0.4 MG SL SUBL
0.4000 mg | SUBLINGUAL_TABLET | SUBLINGUAL | Status: AC | PRN
  Administered 2018-02-09 (×3): 0.4 mg via SUBLINGUAL
  Filled 2018-02-09: qty 1

## 2018-02-09 MED ORDER — ONDANSETRON HCL 4 MG PO TABS: 4.0000 mg | ORAL_TABLET | Freq: Four times a day (QID) | ORAL | Status: DC | PRN

## 2018-02-09 MED ORDER — LABETALOL HCL 5 MG/ML IV SOLN
20.0000 mg | Freq: Once | INTRAVENOUS | Status: AC
  Administered 2018-02-09: 20 mg via INTRAVENOUS
  Filled 2018-02-09: qty 4

## 2018-02-09 MED ORDER — ONDANSETRON HCL 4 MG/2ML IJ SOLN
4.0000 mg | Freq: Four times a day (QID) | INTRAMUSCULAR | Status: DC | PRN
  Administered 2018-02-09: 4 mg via INTRAVENOUS
  Filled 2018-02-09 (×2): qty 2

## 2018-02-09 MED ORDER — HEPARIN SODIUM (PORCINE) 5000 UNIT/ML IJ SOLN
5000.0000 [IU] | Freq: Three times a day (TID) | INTRAMUSCULAR | Status: DC
  Administered 2018-02-09 (×3): 5000 [IU] via SUBCUTANEOUS
  Filled 2018-02-09 (×3): qty 1

## 2018-02-09 MED ORDER — HYDRALAZINE HCL 25 MG PO TABS: 50.0000 mg | ORAL_TABLET | Freq: Three times a day (TID) | ORAL | Status: DC

## 2018-02-09 MED ORDER — HYDRALAZINE HCL 20 MG/ML IJ SOLN
20.0000 mg | INTRAMUSCULAR | Status: DC | PRN
  Administered 2018-02-09: 20 mg via INTRAVENOUS

## 2018-02-09 MED ORDER — CARVEDILOL 6.25 MG PO TABS
18.7500 mg | ORAL_TABLET | Freq: Two times a day (BID) | ORAL | Status: DC
  Administered 2018-02-09 – 2018-02-10 (×3): 18.75 mg via ORAL
  Filled 2018-02-09: qty 2
  Filled 2018-02-09 (×2): qty 1

## 2018-02-09 MED ORDER — LORAZEPAM 2 MG/ML IJ SOLN: 1.0000 mg | Freq: Once | INTRAMUSCULAR | Status: DC | PRN

## 2018-02-09 MED ORDER — ACETAMINOPHEN 325 MG PO TABS
650.0000 mg | ORAL_TABLET | Freq: Four times a day (QID) | ORAL | Status: DC | PRN
  Administered 2018-02-09 – 2018-02-10 (×2): 650 mg via ORAL
  Filled 2018-02-09 (×2): qty 2

## 2018-02-09 MED ORDER — AMLODIPINE BESYLATE 10 MG PO TABS
10.0000 mg | ORAL_TABLET | Freq: Every day | ORAL | Status: DC
  Administered 2018-02-09: 10 mg via ORAL
  Filled 2018-02-09: qty 1

## 2018-02-09 MED ORDER — ISOSORBIDE MONONITRATE ER 60 MG PO TB24
60.0000 mg | ORAL_TABLET | Freq: Every day | ORAL | Status: DC
  Administered 2018-02-09: 60 mg via ORAL
  Filled 2018-02-09: qty 1

## 2018-02-09 MED ORDER — DICLOFENAC SODIUM 1 % TD GEL
2.0000 g | Freq: Four times a day (QID) | TRANSDERMAL | Status: DC
  Administered 2018-02-09 – 2018-02-10 (×4): 2 g via TOPICAL
  Filled 2018-02-09: qty 100

## 2018-02-09 MED ORDER — ACETAMINOPHEN 650 MG RE SUPP: 650.0000 mg | Freq: Four times a day (QID) | RECTAL | Status: DC | PRN

## 2018-02-09 MED ORDER — SODIUM CHLORIDE 0.9 % IV BOLUS
500.0000 mL | Freq: Once | INTRAVENOUS | Status: AC
  Administered 2018-02-09: 500 mL via INTRAVENOUS

## 2018-02-09 MED ORDER — MAGNESIUM SULFATE IN D5W 1-5 GM/100ML-% IV SOLN
1.0000 g | Freq: Once | INTRAVENOUS | Status: AC
  Administered 2018-02-09: 1 g via INTRAVENOUS
  Filled 2018-02-09: qty 100

## 2018-02-09 MED ORDER — LORAZEPAM 2 MG/ML IJ SOLN
1.0000 mg | Freq: Once | INTRAMUSCULAR | Status: AC
  Administered 2018-02-09: 1 mg via INTRAVENOUS
  Filled 2018-02-09: qty 1

## 2018-02-09 MED ORDER — ASPIRIN EC 81 MG PO TBEC
81.0000 mg | DELAYED_RELEASE_TABLET | Freq: Every day | ORAL | Status: DC
  Administered 2018-02-09 – 2018-02-10 (×2): 81 mg via ORAL
  Filled 2018-02-09 (×2): qty 1

## 2018-02-09 MED ORDER — TORSEMIDE 20 MG PO TABS
20.0000 mg | ORAL_TABLET | Freq: Two times a day (BID) | ORAL | Status: DC
  Administered 2018-02-09 – 2018-02-10 (×3): 20 mg via ORAL
  Filled 2018-02-09 (×3): qty 1

## 2018-02-09 NOTE — ED Notes (Signed)
Spoke with Dr. Candiss Norse, aware of elevated trop and BP. Hydralazine dosing adjusted. Pt sleeping at this time, denies pain.

## 2018-02-09 NOTE — Progress Notes (Signed)
  Echocardiogram 2D Echocardiogram has been performed.  Scott Salinas 02/09/2018, 3:46 PM

## 2018-02-09 NOTE — Progress Notes (Signed)
@IPLOG @        PROGRESS NOTE                                                                                                                                                                                                             Patient Demographics:    Scott Salinas, is a 47 y.o. male, DOB - 1970/08/25, EQA:[id number]  Admit date - 02/08/2018   Admitting Physician Etta Quill, DO  Outpatient Primary MD for the patient is Patient, No Pcp Per  LOS - 0  Chief Complaint  Patient presents with  . Shoulder Pain       Brief Narrative  Scott Salinas is a 47 y.o. male with medical history significant of CAD, MI in 2006, CABG 2016, CHF with EF 20-25% on echo last year, accelerated HTN, CKD stage 3.  Patient presents to the ED with c/o L shoulder pain.  Pain in L shoulder, radiates to L arm and L neck, not really in his chest he says.  Onset 3 days ago, slowly worsening till it became significantly worse tonight at home prompting him to come in to the ED because his prior MI felt somewhat like this.   Subjective:    Scott Salinas today has, No headache, No chest pain, No abdominal pain - No Nausea, No new weakness tingling or numbness, No Cough - SOB. +ve L. Scapular pain   Assessment  & Plan :     1.  Hypertensive crisis.  Was placed on combination of Norvasc, Coreg, hydralazine, Imdur along with PRN IV hydralazine, goal will be to keep SBP around 1 97-9 80 and diastolic around 85.  Continue to monitor closely in stepdown setting.  2.  Mildly elevated troponin.  Likely demand ischemia from #1 above, EKG nonacute, seen by cardiology, trend troponin, continue aspirin, beta-blocker and Imdur, allergic to statin.  Currently chest pain-free.  Check echocardiogram to evaluate EF and wall motion.  3.  ARF on CKD 4.  Baseline creatinine is around 1.5, control blood pressure and monitor, avoid sudden drop in blood pressure.  4.  L. Shoulder pain.  Most likely musculoskeletal,  is passive range of motion is painful with abduction, x-ray stable.  Supportive care.  5.  Chronic systolic CHF EF 20 to 89%.  Resume home dose Demadex, continue beta-blocker, control blood pressure, no ACE/ARB due to renal insufficiency.  6. Hypokalemia.  Aggressively replaced.    Family Communication  :  None  Code Status :  Full  Disposition Plan  :  Home 1-2 days  Consults  :  Cards  Procedures  :   TTE.   CT - 1. No aortic dissection, aneurysm, or acute aortic abnormality. Mild atherosclerosis of abdominal aorta and iliac arteries. Aortic Atherosclerosis (ICD10-I70.0). 2. No acute findings in the chest, abdomen, or pelvis. 3. Cardiomegaly, post CABG  DVT Prophylaxis  :    Heparin   Lab Results  Component Value Date   PLT 245 02/09/2018    Diet :  Diet Order            Diet Heart Room service appropriate? Yes; Fluid consistency: Thin  Diet effective now               Inpatient Medications Scheduled Meds: . aspirin EC  81 mg Oral Daily  . carvedilol  18.75 mg Oral BID WC  . heparin  5,000 Units Subcutaneous Q8H  . hydrALAZINE  100 mg Oral TID  . iopamidol      . isosorbide mononitrate  60 mg Oral Daily  . potassium chloride  40 mEq Oral Once  . potassium chloride  40 mEq Oral Once  . torsemide  20 mg Oral BID   Continuous Infusions: . magnesium sulfate 1 - 4 g bolus IVPB    . potassium chloride     PRN Meds:.acetaminophen **OR** [DISCONTINUED] acetaminophen, hydrALAZINE, HYDROcodone-acetaminophen, iopamidol, labetalol, nitroGLYCERIN, [DISCONTINUED] ondansetron **OR** ondansetron (ZOFRAN) IV  Antibiotics  :   Anti-infectives (From admission, onward)   None          Objective:   Vitals:   02/09/18 0745 02/09/18 0800 02/09/18 0829 02/09/18 0835  BP: (!) 157/131 (!) 155/111 (!) 170/146 (!) 161/131  Pulse: (!) 59 (!) 59 85   Resp: 16 15 16    Temp:   97.8 F (36.6 C)   TempSrc:   Oral   SpO2: 98% 98% 100%   Weight:   107.3 kg   Height:   5'  10" (1.778 m)     Wt Readings from Last 3 Encounters:  02/09/18 107.3 kg  06/15/17 116.2 kg  06/01/17 113.9 kg     Intake/Output Summary (Last 24 hours) at 02/09/2018 1007 Last data filed at 02/09/2018 0900 Gross per 24 hour  Intake 940 ml  Output -  Net 940 ml     Physical Exam  Awake Alert, Oriented X 3, No new F.N deficits, Normal affect [place].AT,PERRAL Supple Neck,No JVD, No cervical lymphadenopathy appriciated.  Symmetrical Chest wall movement, Good air movement bilaterally, CTAB RRR,No Gallops,Rubs or new Murmurs, No Parasternal Heave +ve B.Sounds, Abd Soft, No tenderness, No organomegaly appriciated, No rebound - guarding or rigidity. No Cyanosis, Clubbing or edema, L. Shoulder passive ROM mild pain on adduction    Data Review:    CBC Recent Labs  Lab 02/08/18 2330 02/09/18 0753  WBC 9.0 8.5  HGB 17.2* 17.1*  HCT 52.4* 52.2*  PLT 266 245  MCV 91.0 90.9  MCH 29.9 29.8  MCHC 32.8 32.8  RDW 15.5 15.5  LYMPHSABS 1.6  --   MONOABS 0.8  --   EOSABS 0.1  --   BASOSABS 0.0  --     Chemistries  Recent Labs  Lab 02/08/18 2330 02/09/18 0753  NA 138 136  K 3.0* 3.0*  CL 101 102  CO2 26 22  GLUCOSE 99 125*  BUN 18 17  CREATININE 2.26* 1.97*  CALCIUM 9.1 9.2  MG  --  1.7  AST 43*  --   ALT 35  --  ALKPHOS 77  --   BILITOT 0.8  --    ------------------------------------------------------------------------------------------------------------------ No results for input(s): CHOL, HDL, LDLCALC, TRIG, CHOLHDL, LDLDIRECT in the last 72 hours.  Lab Results  Component Value Date   HGBA1C 6.8 (H) 02/12/2017   ------------------------------------------------------------------------------------------------------------------ No results for input(s): TSH, T4TOTAL, T3FREE, THYROIDAB in the last 72 hours.  Invalid input(s): FREET3 ------------------------------------------------------------------------------------------------------------------ No results for  input(s): VITAMINB12, FOLATE, FERRITIN, TIBC, IRON, RETICCTPCT in the last 72 hours.  Coagulation profile No results for input(s): INR, PROTIME in the last 168 hours.  Recent Labs    02/08/18 2330  DDIMER 0.55*    Cardiac Enzymes Recent Labs  Lab 02/09/18 0554  TROPONINI 0.10*   ------------------------------------------------------------------------------------------------------------------    Component Value Date/Time   BNP 1,100.8 (H) 02/09/2018 0751    Micro Results No results found for this or any previous visit (from the past 240 hour(s)).  Radiology Reports Dg Chest 2 View  Result Date: 02/08/2018 CLINICAL DATA:  Left shoulder pain. EXAM: CHEST - 2 VIEW COMPARISON:  February 11, 2017 FINDINGS: Stable cardiomegaly. The heart, hila, mediastinum, lungs, and pleura are otherwise normal. No overt edema. IMPRESSION: No active cardiopulmonary disease. Electronically Signed   By: Dorise Bullion III M.D   On: 02/08/2018 20:42   Dg Shoulder Left  Result Date: 02/08/2018 CLINICAL DATA:  Left-sided shoulder pain.  No trauma or injury. EXAM: LEFT SHOULDER - 2+ VIEW COMPARISON:  November 10, 2015 FINDINGS: There is no evidence of fracture or dislocation. There is no evidence of arthropathy or other focal bone abnormality. Soft tissues are unremarkable. IMPRESSION: Negative. Electronically Signed   By: Dorise Bullion III M.D   On: 02/08/2018 20:41   Ct Angio Chest/abd/pel For Dissection W And/or Wo Contrast  Result Date: 02/09/2018 CLINICAL DATA:  Severe hypertension.  Shoulder and neck pain. EXAM: CT ANGIOGRAPHY CHEST, ABDOMEN AND PELVIS TECHNIQUE: Multidetector CT imaging through the chest, abdomen and pelvis was performed using the standard protocol during bolus administration of intravenous contrast. Multiplanar reconstructed images and MIPs were obtained and reviewed to evaluate the vascular anatomy. CONTRAST:  93mL ISOVUE-370 IOPAMIDOL (ISOVUE-370) INJECTION 76% COMPARISON:  Radiograph  02/08/2018, chest CT 01/12/2015 FINDINGS: CTA CHEST FINDINGS Cardiovascular: Thoracic aorta is normal in caliber without dissection, aneurysm, aortic hematoma or acute aortic syndrome. Post CABG. Mild multi chamber cardiomegaly. Coronary artery calcifications of native coronary arteries. No pericardial effusion. Mediastinum/Nodes: Small mediastinal lymph nodes not enlarged by size criteria. No hilar adenopathy. Esophagus is decompressed. No thyroid nodule. Lungs/Pleura: No focal airspace disease. No pulmonary edema. No pleural effusion. Tiny 4 mm right middle lobe nodule image 31 series 6, unchanged from 2016 CT and considered benign. Musculoskeletal: Prior median sternotomy. There are no acute or suspicious osseous abnormalities. Review of the MIP images confirms the above findings. CTA ABDOMEN AND PELVIS FINDINGS VASCULAR Aorta: Normal caliber aorta without aneurysm, dissection, vasculitis or significant stenosis. Mild atherosclerosis. Celiac: Patent without evidence of aneurysm, dissection, vasculitis or significant stenosis. SMA: Patent without evidence of aneurysm, dissection, vasculitis or significant stenosis. Renals: Both renal arteries are patent without evidence of aneurysm, dissection, vasculitis, fibromuscular dysplasia or significant stenosis. IMA: Patent without evidence of aneurysm, dissection, vasculitis or significant stenosis. Inflow: Patent without evidence of aneurysm, dissection, vasculitis or significant stenosis. Moderate atherosclerosis of bilateral internal iliac arteries. Veins: No obvious venous abnormality within the limitations of this arterial phase study. Review of the MIP images confirms the above findings. NON-VASCULAR Hepatobiliary: No focal abnormality on arterial phase imaging. No gallstone, gallbladder inflammation or  biliary dilatation. Pancreas: No ductal dilatation or inflammation. Spleen: Normal in size and arterial enhancement. Adrenals/Urinary Tract: Normal adrenal glands.  No hydronephrosis. Symmetric homogeneous renal enhancement. Urinary bladder is physiologically distended. Stomach/Bowel: Stomach is within normal limits, physiologically distended with ingested material. Appendix appears normal. No evidence of bowel wall thickening, distention, or inflammatory changes. Lymphatic: No abdominopelvic adenopathy. Reproductive: Prostate is unremarkable. Other: No ascites or free air. Musculoskeletal: Degenerative change in the spine with Modic endplate changes at B2-E1 and L3-L4. There are no acute or suspicious osseous abnormalities. Review of the MIP images confirms the above findings. IMPRESSION: 1. No aortic dissection, aneurysm, or acute aortic abnormality. Mild atherosclerosis of abdominal aorta and iliac arteries. Aortic Atherosclerosis (ICD10-I70.0). 2. No acute findings in the chest, abdomen, or pelvis. 3. Cardiomegaly, post CABG. Electronically Signed   By: Keith Rake M.D.   On: 02/09/2018 05:52    Time Spent in minutes  30   Lala Lund M.D on 02/09/2018 at 10:07 AM  To page go to [email or web address] - password [email or web address]
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
## 2018-02-09 NOTE — ED Notes (Signed)
Spoke with Dr. Alcario Drought regarding continued increased blood pressure and the reluctance of the floor to take patient.  Told to give IV hydralazine and to reassess after shift change.  Floor in agreement with this plan.

## 2018-02-09 NOTE — Progress Notes (Signed)
Progress Note  Patient Name: Scott Salinas Date of Encounter: 02/09/2018  Primary Cardiologist: Loralie Champagne, MD   Subjective   Still wants to know why his shoulder is hurting.  He did not do anything to it.  No chest pain, no significant shortness of breath.  Blood pressure still elevated.  Currently getting magnesium and potassium supplementation.  Inpatient Medications    Scheduled Meds: . amLODipine  10 mg Oral Daily  . aspirin EC  81 mg Oral Daily  . carvedilol  18.75 mg Oral BID WC  . heparin  5,000 Units Subcutaneous Q8H  . hydrALAZINE  100 mg Oral TID  . iopamidol      . isosorbide mononitrate  60 mg Oral Daily  . potassium chloride  40 mEq Oral Once  . potassium chloride  40 mEq Oral Once  . torsemide  20 mg Oral BID   Continuous Infusions: . magnesium sulfate 1 - 4 g bolus IVPB 1 g (02/09/18 1206)  . potassium chloride 10 mEq (02/09/18 1202)   PRN Meds: acetaminophen **OR** [DISCONTINUED] acetaminophen, hydrALAZINE, HYDROcodone-acetaminophen, iopamidol, labetalol, nitroGLYCERIN, [DISCONTINUED] ondansetron **OR** ondansetron (ZOFRAN) IV   Vital Signs    Vitals:   02/09/18 0829 02/09/18 0835 02/09/18 1134 02/09/18 1136  BP: (!) 170/146 (!) 161/131 (!) 142/112 (!) 142/112  Pulse: 85     Resp: 16     Temp: 97.8 F (36.6 C)     TempSrc: Oral     SpO2: 100%     Weight: 107.3 kg     Height: 5\' 10"  (1.778 m)       Intake/Output Summary (Last 24 hours) at 02/09/2018 1253 Last data filed at 02/09/2018 0900 Gross per 24 hour  Intake 940 ml  Output -  Net 940 ml   Filed Weights   02/08/18 1932 02/09/18 0829  Weight: 116.1 kg 107.3 kg    Telemetry    No adverse arrhythmias- Personally Reviewed  ECG    Sinus rhythm biatrial enlargement with nonspecific ST-T wave changes- Personally Reviewed  Physical Exam   GEN: No acute distress.   Neck: No JVD Cardiac: RRR, no murmurs, rubs, or gallops.  Respiratory: Clear to auscultation bilaterally. GI:  Soft, nontender, non-distended  MS: No edema; No deformity. Neuro:  Nonfocal  Psych: Normal affect   Labs    Chemistry Recent Labs  Lab 02/08/18 2330 02/09/18 0753  NA 138 136  K 3.0* 3.0*  CL 101 102  CO2 26 22  GLUCOSE 99 125*  BUN 18 17  CREATININE 2.26* 1.97*  CALCIUM 9.1 9.2  PROT 8.1  --   ALBUMIN 3.6  --   AST 43*  --   ALT 35  --   ALKPHOS 77  --   BILITOT 0.8  --   GFRNONAA 33* 39*  GFRAA 38* 45*  ANIONGAP 11 12     Hematology Recent Labs  Lab 02/08/18 2330 02/09/18 0753  WBC 9.0 8.5  RBC 5.76 5.74  HGB 17.2* 17.1*  HCT 52.4* 52.2*  MCV 91.0 90.9  MCH 29.9 29.8  MCHC 32.8 32.8  RDW 15.5 15.5  PLT 266 245    Cardiac Enzymes Recent Labs  Lab 02/09/18 0554  TROPONINI 0.10*    Recent Labs  Lab 02/08/18 2351 02/09/18 0415  TROPIPOC 0.03 0.00     BNP Recent Labs  Lab 02/09/18 0751  BNP 1,100.8*     DDimer  Recent Labs  Lab 02/08/18 2330  DDIMER 0.55*  Radiology    Dg Chest 2 View  Result Date: 02/08/2018 CLINICAL DATA:  Left shoulder pain. EXAM: CHEST - 2 VIEW COMPARISON:  February 11, 2017 FINDINGS: Stable cardiomegaly. The heart, hila, mediastinum, lungs, and pleura are otherwise normal. No overt edema. IMPRESSION: No active cardiopulmonary disease. Electronically Signed   By: Dorise Bullion III M.D   On: 02/08/2018 20:42   Dg Shoulder Left  Result Date: 02/08/2018 CLINICAL DATA:  Left-sided shoulder pain.  No trauma or injury. EXAM: LEFT SHOULDER - 2+ VIEW COMPARISON:  November 10, 2015 FINDINGS: There is no evidence of fracture or dislocation. There is no evidence of arthropathy or other focal bone abnormality. Soft tissues are unremarkable. IMPRESSION: Negative. Electronically Signed   By: Dorise Bullion III M.D   On: 02/08/2018 20:41   Ct Angio Chest/abd/pel For Dissection W And/or Wo Contrast  Result Date: 02/09/2018 CLINICAL DATA:  Severe hypertension.  Shoulder and neck pain. EXAM: CT ANGIOGRAPHY CHEST, ABDOMEN AND PELVIS  TECHNIQUE: Multidetector CT imaging through the chest, abdomen and pelvis was performed using the standard protocol during bolus administration of intravenous contrast. Multiplanar reconstructed images and MIPs were obtained and reviewed to evaluate the vascular anatomy. CONTRAST:  78mL ISOVUE-370 IOPAMIDOL (ISOVUE-370) INJECTION 76% COMPARISON:  Radiograph 02/08/2018, chest CT 01/12/2015 FINDINGS: CTA CHEST FINDINGS Cardiovascular: Thoracic aorta is normal in caliber without dissection, aneurysm, aortic hematoma or acute aortic syndrome. Post CABG. Mild multi chamber cardiomegaly. Coronary artery calcifications of native coronary arteries. No pericardial effusion. Mediastinum/Nodes: Small mediastinal lymph nodes not enlarged by size criteria. No hilar adenopathy. Esophagus is decompressed. No thyroid nodule. Lungs/Pleura: No focal airspace disease. No pulmonary edema. No pleural effusion. Tiny 4 mm right middle lobe nodule image 31 series 6, unchanged from 2016 CT and considered benign. Musculoskeletal: Prior median sternotomy. There are no acute or suspicious osseous abnormalities. Review of the MIP images confirms the above findings. CTA ABDOMEN AND PELVIS FINDINGS VASCULAR Aorta: Normal caliber aorta without aneurysm, dissection, vasculitis or significant stenosis. Mild atherosclerosis. Celiac: Patent without evidence of aneurysm, dissection, vasculitis or significant stenosis. SMA: Patent without evidence of aneurysm, dissection, vasculitis or significant stenosis. Renals: Both renal arteries are patent without evidence of aneurysm, dissection, vasculitis, fibromuscular dysplasia or significant stenosis. IMA: Patent without evidence of aneurysm, dissection, vasculitis or significant stenosis. Inflow: Patent without evidence of aneurysm, dissection, vasculitis or significant stenosis. Moderate atherosclerosis of bilateral internal iliac arteries. Veins: No obvious venous abnormality within the limitations of  this arterial phase study. Review of the MIP images confirms the above findings. NON-VASCULAR Hepatobiliary: No focal abnormality on arterial phase imaging. No gallstone, gallbladder inflammation or biliary dilatation. Pancreas: No ductal dilatation or inflammation. Spleen: Normal in size and arterial enhancement. Adrenals/Urinary Tract: Normal adrenal glands. No hydronephrosis. Symmetric homogeneous renal enhancement. Urinary bladder is physiologically distended. Stomach/Bowel: Stomach is within normal limits, physiologically distended with ingested material. Appendix appears normal. No evidence of bowel wall thickening, distention, or inflammatory changes. Lymphatic: No abdominopelvic adenopathy. Reproductive: Prostate is unremarkable. Other: No ascites or free air. Musculoskeletal: Degenerative change in the spine with Modic endplate changes at X7-D5 and L3-L4. There are no acute or suspicious osseous abnormalities. Review of the MIP images confirms the above findings. IMPRESSION: 1. No aortic dissection, aneurysm, or acute aortic abnormality. Mild atherosclerosis of abdominal aorta and iliac arteries. Aortic Atherosclerosis (ICD10-I70.0). 2. No acute findings in the chest, abdomen, or pelvis. 3. Cardiomegaly, post CABG. Electronically Signed   By: Keith Rake M.D.   On: 02/09/2018 05:52  Cardiac Studies   ECHO P  Patient Profile     47 y.o. male with known cardiomyopathy, chronic kidney disease here with hypertensive urgency.  History of medication noncompliance.  Assessment & Plan    Ischemic cardiomyopathy/hypertensive urgency - Continue with Para medicine at home, has had issues with compliance. - Continue with current management strategy.  See how he does on his current medications. -No ACE inhibitor because of angioedema.  Chronic kidney disease stage III -Baseline 1.7-1.9  Coronary artery disease status post CABG 2016 - SVG to diagonal occluded. -Continue with aspirin. - CK  in the past has been elevated and his atorvastatin was stopped.  He was referred to lipid clinic, Repatha was started.  I do not think that he continue to follow-up with this.  Elevated troponin -Low level secondary to demand ischemia in the setting of chronic systolic heart failure.  Atypical shoulder pain - No dissection on CT scan.      For questions or updates, please contact [place] Please consult [email or web address] for contact info under        Signed, Candee Furbish, MD  02/09/2018, 12:53 PM
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
## 2018-02-09 NOTE — ED Notes (Signed)
Attending physician text paged regarding pt's elevated troponin.

## 2018-02-09 NOTE — CV Procedure (Signed)
2D echo attempted but patient eating and would rather we try echo later

## 2018-02-09 NOTE — Progress Notes (Signed)
Attempted to perform physical assessment and complete admission hx questions and patient refused.  Patient expressed concerns about his left shoulder pain not being address.  Call MD and received new orders for MRI and Voltaren gel as needed.  Patient verbally abusive and aggressive to staff and states he wants to go home. Patient remains agitated and but agrees with plan for MRI.
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
## 2018-02-09 NOTE — ED Notes (Signed)
Patient transported to CT 

## 2018-02-10 ENCOUNTER — Observation Stay (HOSPITAL_COMMUNITY): Payer: Medicaid Other

## 2018-02-10 ENCOUNTER — Other Ambulatory Visit: Payer: Self-pay

## 2018-02-10 ENCOUNTER — Encounter (HOSPITAL_COMMUNITY): Payer: Self-pay

## 2018-02-10 DIAGNOSIS — M25512 Pain in left shoulder: Secondary | ICD-10-CM | POA: Diagnosis not present

## 2018-02-10 DIAGNOSIS — E876 Hypokalemia: Secondary | ICD-10-CM | POA: Diagnosis not present

## 2018-02-10 DIAGNOSIS — I160 Hypertensive urgency: Secondary | ICD-10-CM | POA: Diagnosis not present

## 2018-02-10 DIAGNOSIS — I10 Essential (primary) hypertension: Secondary | ICD-10-CM | POA: Diagnosis not present

## 2018-02-10 DIAGNOSIS — N183 Chronic kidney disease, stage 3 (moderate): Secondary | ICD-10-CM | POA: Diagnosis not present

## 2018-02-10 LAB — BASIC METABOLIC PANEL
Anion gap: 12 (ref 5–15)
BUN: 19 mg/dL (ref 6–20)
CALCIUM: 9.2 mg/dL (ref 8.9–10.3)
CO2: 23 mmol/L (ref 22–32)
CREATININE: 2.07 mg/dL — AB (ref 0.61–1.24)
Chloride: 103 mmol/L (ref 98–111)
GFR calc non Af Amer: 36 mL/min — ABNORMAL LOW (ref >60)
GFR, EST AFRICAN AMERICAN: 42 mL/min — AB (ref >60)
Glucose, Bld: 106 mg/dL — ABNORMAL HIGH (ref 70–99)
Potassium: 3.5 mmol/L (ref 3.5–5.1)
SODIUM: 138 mmol/L (ref 135–145)

## 2018-02-10 LAB — MAGNESIUM: MAGNESIUM: 2 mg/dL (ref 1.7–2.4)

## 2018-02-10 MED ORDER — HYDRALAZINE HCL 50 MG PO TABS
100.0000 mg | ORAL_TABLET | Freq: Three times a day (TID) | ORAL | Status: DC
  Administered 2018-02-10 (×2): 100 mg via ORAL
  Filled 2018-02-10 (×2): qty 2

## 2018-02-10 MED ORDER — POTASSIUM CHLORIDE CRYS ER 20 MEQ PO TBCR
40.0000 meq | EXTENDED_RELEASE_TABLET | Freq: Once | ORAL | Status: AC
  Administered 2018-02-10: 40 meq via ORAL
  Filled 2018-02-10: qty 2

## 2018-02-10 MED ORDER — NICOTINE 21 MG/24HR TD PT24: 21.0000 mg | MEDICATED_PATCH | TRANSDERMAL | 0 refills | Status: DC

## 2018-02-10 MED ORDER — AMLODIPINE BESYLATE 10 MG PO TABS: 10.0000 mg | ORAL_TABLET | Freq: Every day | ORAL | 0 refills | Status: DC

## 2018-02-10 MED ORDER — AMLODIPINE BESYLATE 10 MG PO TABS
10.0000 mg | ORAL_TABLET | Freq: Every day | ORAL | Status: DC
  Administered 2018-02-10: 10 mg via ORAL
  Filled 2018-02-10: qty 1

## 2018-02-10 MED ORDER — TRAMADOL HCL 50 MG PO TABS: 50.0000 mg | ORAL_TABLET | Freq: Four times a day (QID) | ORAL | 0 refills | Status: DC | PRN

## 2018-02-10 MED ORDER — ISOSORBIDE MONONITRATE ER 60 MG PO TB24
60.0000 mg | ORAL_TABLET | Freq: Every day | ORAL | Status: DC
  Administered 2018-02-10: 60 mg via ORAL
  Filled 2018-02-10: qty 1

## 2018-02-10 MED ORDER — DICLOFENAC SODIUM 1 % TD GEL: 2.0000 g | Freq: Four times a day (QID) | TRANSDERMAL | 0 refills | Status: DC

## 2018-02-10 MED ORDER — CARVEDILOL 25 MG PO TABS: 25.0000 mg | ORAL_TABLET | Freq: Two times a day (BID) | ORAL | Status: DC

## 2018-02-10 MED ORDER — HYDRALAZINE HCL 50 MG PO TABS: 100.0000 mg | ORAL_TABLET | Freq: Three times a day (TID) | ORAL | 0 refills | Status: DC

## 2018-02-10 NOTE — Discharge Summary (Signed)
Scott Salinas KZL:[id number] DOB: 1970-09-02 DOA: 02/08/2018  PCP: Patient, No Pcp Per  Admit date: 02/08/2018  Discharge date: 02/10/2018  Admitted From: Home   Disposition:  Home   Recommendations for Outpatient Follow-up:   Follow up with PCP in 1-2 weeks  PCP Please obtain BMP/CBC, 2 view CXR in 1week,  (see Discharge instructions)   PCP Please follow up on the following pending results:    Home Health: None   Equipment/Devices: None  Consultations: Cards Discharge Condition: Fair   CODE STATUS: Full   Diet Recommendation: Heart Healthy strict 1.5 L/day total fluid restriction.  Diet Order            Diet - low sodium heart healthy        Diet Heart Room service appropriate? Yes; Fluid consistency: Thin  Diet effective now               Chief Complaint  Patient presents with  . Shoulder Pain     Brief history of present illness from the day of admission and additional interim summary    Scott Salinas a 47 y.o.malewith medical history significant ofCAD, MI in 2006, CABG 2016, CHF with EF 20-25% on echo last year, accelerated HTN, CKD stage 3.  Patient presents to the ED with c/o L shoulder pain. Pain in L shoulder, radiates to L arm and L neck, not really in his chest he says. Onset 3 days ago, slowly worsening till it became significantly worse tonight at home prompting him to come in to the ED because his prior MI felt somewhat like this.                                                                 Hospital Course   1.  Hypertensive crisis.  Likely due to noncompliance, he was placed on combination of Norvasc, Coreg, hydralazine, Imdur  with good blood pressure control will be discharged on same medications, Norvasc prescription provided and counseled on compliance.  2.   Mildly elevated troponin.  Likely demand ischemia from #1 above, EKG nonacute, seen by cardiology, table flat and non-ACS pattern troponin trend, continue aspirin, beta-blocker and Imdur, he is allergic to statin.  Currently chest pain-free.  Echocardiogram reviewed with unchanged EF, discussed with cardiologist Dr. Marlou Porch, discharge outpatient with outpatient follow-up with his primary cardiologist, again patient appears to be noncompliant with his follow-ups and medications and counseled again.  3.  ARF on CKD 4.  Baseline creatinine is around 1.5, control blood pressure and monitor, avoid sudden drop in blood pressure.  4.  L. Shoulder pain.  Most likely musculoskeletal, his passive range of motion is painful with abduction, he was stable and he refused MRI of the left shoulder.  He did exhibit some narcotic seeking behavior as well and  was very abusive to the staff constantly demanding narcotics currently read nursing note, also kindly read MD note from a 2018 admission by Dr. Wyline Copas with similar suspicion.  5.  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  Resumed home dose Demadex along with Aldactone, continue beta-blocker, control blood pressure, no ACE/ARB due to renal insufficiency.  6. Hypokalemia.  Aggressively replaced stable.   Discharge diagnosis     Principal Problem:   Hypertensive urgency Active Problems:   Accelerated hypertension   CKD (chronic kidney disease) stage 3, GFR 30-59 ml/min (HCC)   S/P CABG x 4   Drug abuse (HCC)   Chronic combined systolic and diastolic CHF (congestive heart failure) ([place])    Discharge instructions    Discharge Instructions    Diet - low sodium heart healthy   Complete by:  As directed    Discharge instructions   Complete by:  As directed    Follow with Primary MD and your Cardiologist in 7 days   Get CBC, CMP, 2 view Chest X ray checked  by Primary MD  in 5-7 days    Activity: As tolerated with Full fall precautions  use walker/cane & assistance as needed  Disposition Home    Diet: Heart Healthy, strict 1.5lit/day fluid restriction .   For Heart failure patients - Check your Weight same time everyday, if you gain over 2 pounds, or you develop in leg swelling, experience more shortness of breath or chest pain, call your Primary MD immediately. Follow Cardiac Low Salt Diet and 1.5 lit/day fluid restriction.  Special Instructions: If you have smoked or chewed Tobacco  in the last 2 yrs please stop smoking, stop any regular Alcohol  and or any Recreational drug use.  On your next visit with your primary care physician please Get Medicines reviewed and adjusted.  Please request your Prim.MD to go over all Hospital Tests and Procedure/Radiological results at the follow up, please get all Hospital records sent to your Prim MD by signing hospital release before you go home.  If you experience worsening of your admission symptoms, develop shortness of breath, life threatening emergency, suicidal or homicidal thoughts you must seek medical attention immediately by calling 911 or calling your MD immediately  if symptoms less severe.  You Must read complete instructions/literature along with all the possible adverse reactions/side effects for all the Medicines you take and that have been prescribed to you. Take any new Medicines after you have completely understood and accpet all the possible adverse reactions/side effects.   Increase activity slowly   Complete by:  As directed       Discharge Medications   Allergies as of 02/10/2018      Reactions   Lisinopril Anaphylaxis, Swelling   Whole right side of face became swollen      Medication List    TAKE these medications   amLODipine 10 MG tablet Commonly known as:  NORVASC Take 1 tablet (10 mg total) by mouth daily. Start taking on:  02/11/2018   aspirin 81 MG EC tablet Take 1 tablet (81 mg total) by mouth daily.   carvedilol 12.5 MG tablet Commonly  known as:  COREG Take 1.5 tablets (18.75 mg total) by mouth 2 (two) times daily with a meal.   diclofenac sodium 1 % Gel Commonly known as:  VOLTAREN Apply 2 g topically 4 (four) times daily.   Evolocumab 140 MG/ML Soaj Inject 140 mg into the skin every 14 (fourteen) days.  hydrALAZINE 50 MG tablet Commonly known as:  APRESOLINE Take 2 tablets (100 mg total) by mouth 3 (three) times daily. What changed:  how much to take   isosorbide mononitrate 60 MG 24 hr tablet Commonly known as:  IMDUR Take 1 tablet (60 mg total) by mouth daily.   nicotine 21 mg/24hr patch Commonly known as:  NICODERM CQ - dosed in mg/24 hours Place 1 patch (21 mg total) onto the skin daily.   nitroGLYCERIN 0.4 MG SL tablet Commonly known as:  NITROSTAT Place 1 tablet (0.4 mg total) every 5 (five) minutes as needed under the tongue for chest pain.   NON FORMULARY 1 Scoop See admin instructions. Ashwagandha root powder: Mix 1 small scoop into 4-8 ounces of water and drink as needed for anxiety or stress   potassium chloride SA 20 MEQ tablet Commonly known as:  K-DUR,KLOR-CON Take 2 tabs in AM and 1 tab in PM What changed:    how much to take  how to take this  when to take this  additional instructions   spironolactone 25 MG tablet Commonly known as:  ALDACTONE Take 0.5 tablets (12.5 mg total) by mouth daily.   torsemide 20 MG tablet Commonly known as:  DEMADEX Take 2 tablets (40 mg total) by mouth 2 (two) times daily.   traMADol 50 MG tablet Commonly known as:  ULTRAM Take 1 tablet (50 mg total) by mouth every 6 (six) hours as needed for up to 15 doses.       Follow-up Information    Larey Dresser, MD. Schedule an appointment as soon as possible for a visit in 1 week(s).   Specialty:  Cardiology Contact information: [place] [address] [phone]           Major procedures and Radiology Reports - PLEASE review detailed and final reports thoroughly  -        TTE - Left ventricle: The cavity size was moderately to severely dilated. Systolic function was severely reduced. The estimated ejection fraction was in the range of 20% to 25%. Diffuse hypokinesis. Doppler parameters are consistent with a reversible restrictive pattern, indicative of decreased left ventricular diastolic compliance and/or increased left atrial pressure (grade  3 diastolic dysfunction). - Aortic valve: There was trivial regurgitation. - Left atrium: The atrium was moderately to severely dilated. - Right ventricle: The cavity size was mildly dilated. Wall  thickness was normal. Systolic function was mildly reduced. - Right atrium: The atrium was severely dilated. - Atrial septum: No defect or patent foramen ovale was identified. - Tricuspid valve: There was moderate regurgitation. - Pulmonary arteries: Systolic pressure was severely increased. PA  peak pressure: 60 mm Hg (S).  Impressions:- Dilated LV with severely reduced systolic and diastolic function.  Elevated right sided pressures.   Dg Chest 2 View  Result Date: 02/08/2018 CLINICAL DATA:  Left shoulder pain. EXAM: CHEST - 2 VIEW COMPARISON:  February 11, 2017 FINDINGS: Stable cardiomegaly. The heart, hila, mediastinum, lungs, and pleura are otherwise normal. No overt edema. IMPRESSION: No active cardiopulmonary disease. Electronically Signed   By: Dorise Bullion III M.D   On: 02/08/2018 20:42   Dg Shoulder Left  Result Date: 02/08/2018 CLINICAL DATA:  Left-sided shoulder pain.  No trauma or injury. EXAM: LEFT SHOULDER - 2+ VIEW COMPARISON:  November 10, 2015 FINDINGS: There is no evidence of fracture or dislocation. There is no evidence of arthropathy or other focal bone abnormality. Soft tissues are unremarkable. IMPRESSION: Negative.  Electronically Signed   By: Dorise Bullion III M.D   On: 02/08/2018 20:41   Ct Angio Chest/abd/pel For Dissection W And/or Wo Contrast  Result Date: 02/09/2018 CLINICAL DATA:  Severe  hypertension.  Shoulder and neck pain. EXAM: CT ANGIOGRAPHY CHEST, ABDOMEN AND PELVIS TECHNIQUE: Multidetector CT imaging through the chest, abdomen and pelvis was performed using the standard protocol during bolus administration of intravenous contrast. Multiplanar reconstructed images and MIPs were obtained and reviewed to evaluate the vascular anatomy. CONTRAST:  38mL ISOVUE-370 IOPAMIDOL (ISOVUE-370) INJECTION 76% COMPARISON:  Radiograph 02/08/2018, chest CT 01/12/2015 FINDINGS: CTA CHEST FINDINGS Cardiovascular: Thoracic aorta is normal in caliber without dissection, aneurysm, aortic hematoma or acute aortic syndrome. Post CABG. Mild multi chamber cardiomegaly. Coronary artery calcifications of native coronary arteries. No pericardial effusion. Mediastinum/Nodes: Small mediastinal lymph nodes not enlarged by size criteria. No hilar adenopathy. Esophagus is decompressed. No thyroid nodule. Lungs/Pleura: No focal airspace disease. No pulmonary edema. No pleural effusion. Tiny 4 mm right middle lobe nodule image 31 series 6, unchanged from 2016 CT and considered benign. Musculoskeletal: Prior median sternotomy. There are no acute or suspicious osseous abnormalities. Review of the MIP images confirms the above findings. CTA ABDOMEN AND PELVIS FINDINGS VASCULAR Aorta: Normal caliber aorta without aneurysm, dissection, vasculitis or significant stenosis. Mild atherosclerosis. Celiac: Patent without evidence of aneurysm, dissection, vasculitis or significant stenosis. SMA: Patent without evidence of aneurysm, dissection, vasculitis or significant stenosis. Renals: Both renal arteries are patent without evidence of aneurysm, dissection, vasculitis, fibromuscular dysplasia or significant stenosis. IMA: Patent without evidence of aneurysm, dissection, vasculitis or significant stenosis. Inflow: Patent without evidence of aneurysm, dissection, vasculitis or significant stenosis. Moderate atherosclerosis of bilateral  internal iliac arteries. Veins: No obvious venous abnormality within the limitations of this arterial phase study. Review of the MIP images confirms the above findings. NON-VASCULAR Hepatobiliary: No focal abnormality on arterial phase imaging. No gallstone, gallbladder inflammation or biliary dilatation. Pancreas: No ductal dilatation or inflammation. Spleen: Normal in size and arterial enhancement. Adrenals/Urinary Tract: Normal adrenal glands. No hydronephrosis. Symmetric homogeneous renal enhancement. Urinary bladder is physiologically distended. Stomach/Bowel: Stomach is within normal limits, physiologically distended with ingested material. Appendix appears normal. No evidence of bowel wall thickening, distention, or inflammatory changes. Lymphatic: No abdominopelvic adenopathy. Reproductive: Prostate is unremarkable. Other: No ascites or free air. Musculoskeletal: Degenerative change in the spine with Modic endplate changes at M5-H8 and L3-L4. There are no acute or suspicious osseous abnormalities. Review of the MIP images confirms the above findings. IMPRESSION: 1. No aortic dissection, aneurysm, or acute aortic abnormality. Mild atherosclerosis of abdominal aorta and iliac arteries. Aortic Atherosclerosis (ICD10-I70.0). 2. No acute findings in the chest, abdomen, or pelvis. 3. Cardiomegaly, post CABG. Electronically Signed   By: Keith Rake M.D.   On: 02/09/2018 05:52    Micro Results     Recent Results (from the past 240 hour(s))  MRSA PCR Screening     Status: None   Collection Time: 02/09/18  8:53 AM  Result Value Ref Range Status   MRSA by PCR NEGATIVE NEGATIVE Final    Comment:        The GeneXpert MRSA Assay (FDA approved for NASAL specimens only), is one component of a comprehensive MRSA colonization surveillance program. It is not intended to diagnose MRSA infection nor to guide or monitor treatment for MRSA infections. Performed at [facility], [place] [address]  [place], [place] [address]     Today   Subjective    Scott Salinas  today has no headache,no chest abdominal pain,no new weakness tingling or numbness, feels much better wants to go home today.    Objective   Blood pressure (!) 161/126, pulse 94, temperature 98.1 F (36.7 C), temperature source Oral, resp. rate 20, height 5\' 10"  (1.778 m), weight 106.6 kg, SpO2 99 %.   Intake/Output Summary (Last 24 hours) at 02/10/2018 1345 Last data filed at 02/09/2018 2030 Gross per 24 hour  Intake 480 ml  Output -  Net 480 ml    Exam Awake Alert, Oriented x 3, No new F.N deficits, Normal affect [place].AT,PERRAL Supple Neck,No JVD, No cervical lymphadenopathy appriciated.  Symmetrical Chest wall movement, Good air movement bilaterally, CTAB RRR,No Gallops,Rubs or new Murmurs, No Parasternal Heave +ve B.Sounds, Abd Soft, Non tender, No organomegaly appriciated, No rebound -guarding or rigidity. No Cyanosis, Clubbing or edema, No new Rash or bruise, L.  Shoulder passive range of motion is painful with abduction   Data Review   CBC w Diff:  Lab Results  Component Value Date   WBC 8.5 02/09/2018   HGB 17.1 (H) 02/09/2018   HCT 52.2 (H) 02/09/2018   PLT 245 02/09/2018   LYMPHOPCT 18 02/08/2018   MONOPCT 9 02/08/2018   EOSPCT 1 02/08/2018   BASOPCT 0 02/08/2018    CMP:  Lab Results  Component Value Date   NA 138 02/10/2018   NA 138 07/14/2016   K 3.5 02/10/2018   CL 103 02/10/2018   CO2 23 02/10/2018   BUN 19 02/10/2018   BUN 21 07/14/2016   CREATININE 2.07 (H) 02/10/2018   CREATININE 1.22 02/26/2015   PROT 8.1 02/08/2018   PROT 7.3 10/10/2017   ALBUMIN 3.6 02/08/2018   ALBUMIN 3.9 10/10/2017   BILITOT 0.8 02/08/2018   BILITOT 0.5 10/10/2017   ALKPHOS 77 02/08/2018   AST 43 (H) 02/08/2018   ALT 35 02/08/2018  .   Total Time in preparing paper work, data evaluation and todays exam - 6 minutes  Lala Lund M.D on 02/10/2018 at 1:45 PM  [place]   [phone]

## 2018-02-10 NOTE — Progress Notes (Signed)
Scott Salinas is very agitated at this time.  He has expressed a desire for me to call the physician and have his Norco dose increased to 10mg  as he takes at home.  The conversation became contradictory as he told me that he ---did not want or need any pain medications.  He wanted me to call the physician to increase the dose.  He could call someone outside of the hospital and have 100 oxys delivered within the hour ( he picked up his phone and dialed someone at that point but did not ask for a drug delivery that I heard).  He was going to another hospital where they would give him pain medications.  He should just leave and get pain medications from people outside of the hospital and treat himself.  He is angry that the doctors are not concerned with his shoulder pain.  We are treating him like a drug addict.  We could be giving him placebos, he knows that the prison system pumps drugs through the vents--- I told Scott Salinas that He could have another dose of Norco at 2340 if he felt he needed it.  He became belligerent, yelling, jumping out of the bed and yelling that he didn't need pain medications and he was leaving.  I asked him to let me know before he left in order to remove the telemetry box and IV.  He said he would do whatever he wanted.  He called a woman Advertising copywriter) and told her to pick him up,  He started yelling about what I had told him and that I would not leave.  He asked me to talk to her.  She said nobody was going to come pick him up but her and she wasn't going to.  She asked me to just leave him alone tonight and he would calm down.  I returned the phone to Scott Salinas, left the room and notified telemetry monitor to notify me immediately if he stopped picking up.  I will continue to monitor him through telemetry and disturb him as little as possible.  I will encourage him to allow [place] to take vital signs and have reinforced to him that I am here to help him if he calls for assistance.  He is presently  lying in bed with the covers pulled up around his shoulders and on the telemetry monitor.

## 2018-02-10 NOTE — Discharge Instructions (Signed)
Follow with Primary MD and your Cardiologist in 7 days   Get CBC, CMP, 2 view Chest X ray checked  by Primary MD  in 5-7 days    Activity: As tolerated with Full fall precautions use walker/cane & assistance as needed  Disposition Home    Diet: Heart Healthy, strict 1.5lit/day fluid restriction .   For Heart failure patients - Check your Weight same time everyday, if you gain over 2 pounds, or you develop in leg swelling, experience more shortness of breath or chest pain, call your Primary MD immediately. Follow Cardiac Low Salt Diet and 1.5 lit/day fluid restriction.  Special Instructions: If you have smoked or chewed Tobacco  in the last 2 yrs please stop smoking, stop any regular Alcohol  and or any Recreational drug use.  On your next visit with your primary care physician please Get Medicines reviewed and adjusted.  Please request your Prim.MD to go over all Hospital Tests and Procedure/Radiological results at the follow up, please get all Hospital records sent to your Prim MD by signing hospital release before you go home.  If you experience worsening of your admission symptoms, develop shortness of breath, life threatening emergency, suicidal or homicidal thoughts you must seek medical attention immediately by calling 911 or calling your MD immediately  if symptoms less severe.  You Must read complete instructions/literature along with all the possible adverse reactions/side effects for all the Medicines you take and that have been prescribed to you. Take any new Medicines after you have completely understood and accpet all the possible adverse reactions/side effects.

## 2018-02-10 NOTE — Progress Notes (Signed)
Progress Note  Patient Name: Scott Salinas Date of Encounter: 02/10/2018  Primary Cardiologist: Loralie Champagne, MD   Subjective   Earlier this morning was quite belligerent to nursing staff.  No shortness of breath.  Left shoulder pain noted.  Eating breakfast.  Not enough food he states.  Inpatient Medications    Scheduled Meds: . amLODipine  10 mg Oral Daily  . aspirin EC  81 mg Oral Daily  . carvedilol  18.75 mg Oral BID WC  . diclofenac sodium  2 g Topical QID  . heparin  5,000 Units Subcutaneous Q8H  . hydrALAZINE  100 mg Oral TID  . isosorbide mononitrate  60 mg Oral Daily  . torsemide  20 mg Oral BID   Continuous Infusions:  PRN Meds: acetaminophen **OR** [DISCONTINUED] acetaminophen, hydrALAZINE, HYDROcodone-acetaminophen, iopamidol, labetalol, LORazepam, nitroGLYCERIN, [DISCONTINUED] ondansetron **OR** ondansetron (ZOFRAN) IV   Vital Signs    Vitals:   02/09/18 2039 02/10/18 0521 02/10/18 0524 02/10/18 0802  BP: (!) 158/115  (!) 162/137 (!) 161/126  Pulse: 78  94   Resp: 16  20   Temp: 98.4 F (36.9 C)  98.1 F (36.7 C)   TempSrc: Oral  Oral   SpO2: 100%  99%   Weight:  106.6 kg    Height:        Intake/Output Summary (Last 24 hours) at 02/10/2018 0848 Last data filed at 02/09/2018 2030 Gross per 24 hour  Intake 720 ml  Output -  Net 720 ml   Filed Weights   02/08/18 1932 02/09/18 0829 02/10/18 0521  Weight: 116.1 kg 107.3 kg 106.6 kg    Telemetry    Sinus rhythm, no adverse arrhythmias- Personally Reviewed  ECG    Sinus rhythm LVH T wave inversion noted laterally no significant change- Personally Reviewed  Physical Exam   GEN: No acute distress.  Obese Neck: No JVD Cardiac: RRR, no murmurs, rubs, or gallops.  Respiratory: Clear to auscultation bilaterally. GI: Soft, nontender, non-distended  MS: No edema; No deformity. Neuro:  Nonfocal  Psych: Normal affect   Labs    Chemistry Recent Labs  Lab 02/08/18 2330 02/09/18 0753  02/09/18 1708 02/10/18 0539  NA 138 136  --  138  K 3.0* 3.0* 3.9 3.5  CL 101 102  --  103  CO2 26 22  --  23  GLUCOSE 99 125*  --  106*  BUN 18 17  --  19  CREATININE 2.26* 1.97*  --  2.07*  CALCIUM 9.1 9.2  --  9.2  PROT 8.1  --   --   --   ALBUMIN 3.6  --   --   --   AST 43*  --   --   --   ALT 35  --   --   --   ALKPHOS 77  --   --   --   BILITOT 0.8  --   --   --   GFRNONAA 33* 39*  --  36*  GFRAA 38* 45*  --  42*  ANIONGAP 11 12  --  12     Hematology Recent Labs  Lab 02/08/18 2330 02/09/18 0753  WBC 9.0 8.5  RBC 5.76 5.74  HGB 17.2* 17.1*  HCT 52.4* 52.2*  MCV 91.0 90.9  MCH 29.9 29.8  MCHC 32.8 32.8  RDW 15.5 15.5  PLT 266 245    Cardiac Enzymes Recent Labs  Lab 02/09/18 0554 02/09/18 1156 02/09/18 1708  TROPONINI 0.10* 0.11* 0.12*  Recent Labs  Lab 02/08/18 2351 02/09/18 0415  TROPIPOC 0.03 0.00     BNP Recent Labs  Lab 02/09/18 0751  BNP 1,100.8*     DDimer  Recent Labs  Lab 02/08/18 2330  DDIMER 0.55*     Radiology    Dg Chest 2 View  Result Date: 02/08/2018 CLINICAL DATA:  Left shoulder pain. EXAM: CHEST - 2 VIEW COMPARISON:  February 11, 2017 FINDINGS: Stable cardiomegaly. The heart, hila, mediastinum, lungs, and pleura are otherwise normal. No overt edema. IMPRESSION: No active cardiopulmonary disease. Electronically Signed   By: Dorise Bullion III M.D   On: 02/08/2018 20:42   Dg Shoulder Left  Result Date: 02/08/2018 CLINICAL DATA:  Left-sided shoulder pain.  No trauma or injury. EXAM: LEFT SHOULDER - 2+ VIEW COMPARISON:  November 10, 2015 FINDINGS: There is no evidence of fracture or dislocation. There is no evidence of arthropathy or other focal bone abnormality. Soft tissues are unremarkable. IMPRESSION: Negative. Electronically Signed   By: Dorise Bullion III M.D   On: 02/08/2018 20:41   Ct Angio Chest/abd/pel For Dissection W And/or Wo Contrast  Result Date: 02/09/2018 CLINICAL DATA:  Severe hypertension.  Shoulder and  neck pain. EXAM: CT ANGIOGRAPHY CHEST, ABDOMEN AND PELVIS TECHNIQUE: Multidetector CT imaging through the chest, abdomen and pelvis was performed using the standard protocol during bolus administration of intravenous contrast. Multiplanar reconstructed images and MIPs were obtained and reviewed to evaluate the vascular anatomy. CONTRAST:  65mL ISOVUE-370 IOPAMIDOL (ISOVUE-370) INJECTION 76% COMPARISON:  Radiograph 02/08/2018, chest CT 01/12/2015 FINDINGS: CTA CHEST FINDINGS Cardiovascular: Thoracic aorta is normal in caliber without dissection, aneurysm, aortic hematoma or acute aortic syndrome. Post CABG. Mild multi chamber cardiomegaly. Coronary artery calcifications of native coronary arteries. No pericardial effusion. Mediastinum/Nodes: Small mediastinal lymph nodes not enlarged by size criteria. No hilar adenopathy. Esophagus is decompressed. No thyroid nodule. Lungs/Pleura: No focal airspace disease. No pulmonary edema. No pleural effusion. Tiny 4 mm right middle lobe nodule image 31 series 6, unchanged from 2016 CT and considered benign. Musculoskeletal: Prior median sternotomy. There are no acute or suspicious osseous abnormalities. Review of the MIP images confirms the above findings. CTA ABDOMEN AND PELVIS FINDINGS VASCULAR Aorta: Normal caliber aorta without aneurysm, dissection, vasculitis or significant stenosis. Mild atherosclerosis. Celiac: Patent without evidence of aneurysm, dissection, vasculitis or significant stenosis. SMA: Patent without evidence of aneurysm, dissection, vasculitis or significant stenosis. Renals: Both renal arteries are patent without evidence of aneurysm, dissection, vasculitis, fibromuscular dysplasia or significant stenosis. IMA: Patent without evidence of aneurysm, dissection, vasculitis or significant stenosis. Inflow: Patent without evidence of aneurysm, dissection, vasculitis or significant stenosis. Moderate atherosclerosis of bilateral internal iliac arteries. Veins:  No obvious venous abnormality within the limitations of this arterial phase study. Review of the MIP images confirms the above findings. NON-VASCULAR Hepatobiliary: No focal abnormality on arterial phase imaging. No gallstone, gallbladder inflammation or biliary dilatation. Pancreas: No ductal dilatation or inflammation. Spleen: Normal in size and arterial enhancement. Adrenals/Urinary Tract: Normal adrenal glands. No hydronephrosis. Symmetric homogeneous renal enhancement. Urinary bladder is physiologically distended. Stomach/Bowel: Stomach is within normal limits, physiologically distended with ingested material. Appendix appears normal. No evidence of bowel wall thickening, distention, or inflammatory changes. Lymphatic: No abdominopelvic adenopathy. Reproductive: Prostate is unremarkable. Other: No ascites or free air. Musculoskeletal: Degenerative change in the spine with Modic endplate changes at O8-C1 and L3-L4. There are no acute or suspicious osseous abnormalities. Review of the MIP images confirms the above findings. IMPRESSION: 1. No aortic dissection, aneurysm,  or acute aortic abnormality. Mild atherosclerosis of abdominal aorta and iliac arteries. Aortic Atherosclerosis (ICD10-I70.0). 2. No acute findings in the chest, abdomen, or pelvis. 3. Cardiomegaly, post CABG. Electronically Signed   By: Keith Rake M.D.   On: 02/09/2018 05:52    Cardiac Studies   Echocardiogram 02/09/2018: - Left ventricle: The cavity size was moderately to severely   dilated. Systolic function was severely reduced. The estimated   ejection fraction was in the range of 20% to 25%. Diffuse   hypokinesis. Doppler parameters are consistent with a reversible   restrictive pattern, indicative of decreased left ventricular   diastolic compliance and/or increased left atrial pressure (grade   3 diastolic dysfunction). - Aortic valve: There was trivial regurgitation. - Left atrium: The atrium was moderately to severely  dilated. - Right ventricle: The cavity size was mildly dilated. Wall   thickness was normal. Systolic function was mildly reduced. - Right atrium: The atrium was severely dilated. - Atrial septum: No defect or patent foramen ovale was identified. - Tricuspid valve: There was moderate regurgitation. - Pulmonary arteries: Systolic pressure was severely increased. PA   peak pressure: 60 mm Hg (S).  Impressions:  - Dilated LV with severely reduced systolic and diastolic function.   Elevated right sided pressures.  Patient Profile     47 y.o. male here with hypertensive urgency, coronary artery disease status post CABG, chronic kidney disease, medication noncompliance, left shoulder pain  Assessment & Plan    Hypertensive urgency/ischemic cardiomyopathy -Blood pressure still remains quite elevated.  Likely noncompliant at home.  Has a history of this. - Heart rate seems reasonable to increase his carvedilol to 25 twice a day.  We will make this change. - Continue with amlodipine, hydralazine 100 3 times daily, isosorbide, torsemide 20 twice daily - Blood pressure still uncontrolled tomorrow, consider clonidine.  When asked if he takes his medication at home, he did state yes.  Shoulder pain - Noncardiac.  Quite belligerent with nursing staff this morning.  Note reviewed. -No evidence of dissection on CT scan.  Getting evaluated with MRI  Coronary artery disease status post CABG 2016 - Aspirin.  SVG to diagonal was occluded.  CK in the past has been elevated so atorvastatin was stopped.  Repatha was started but it appears that he only received 2 doses.  Noncompliance.  Elevated troponin - In the setting of chronic systolic heart failure and elevated blood pressure.  Demand ischemia.  Does not appear to be acute coronary syndrome based upon graphic trend.  No further invasive or noninvasive testing.  Although this does not represent ACS, this does portend a worsened prognosis  overall.  Secondary pulmonary hypertension -This is a result of left heart disease as well as severely elevated blood pressures.  Hopefully as blood pressures continue to improve, pulmonary pressures will also improve.  Chronic kidney disease stage III/IV -Creatinine 2 this morning, not on ACE inhibitor  Obesity -Encourage weight loss.      For questions or updates, please contact [place] Please consult [email or web address] for contact info under        Signed, Candee Furbish, MD  02/10/2018, 8:48 AM
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
## 2018-02-12 MED FILL — traMADol HCL 50 MG TABS: 50 | 4 days supply | Qty: 15 | Fill #0

## 2018-02-12 MED FILL — AMLODIPINE BESYLATE 10 MG T: 10 | 30 days supply | Qty: 30 | Fill #0

## 2018-02-12 MED FILL — hydrALAZINE HCL 50 MG TABS: 50 | 34 days supply | Qty: 102 | Fill #2

## 2018-02-14 MED FILL — DICLOFENAC SODIUM 1% GEL: 1 | 13 days supply | Qty: 100 | Fill #0

## 2018-02-18 MED FILL — TORSEMIDE 20 MG TABLET: 20 | 25 days supply | Qty: 100 | Fill #1

## 2018-02-22 ENCOUNTER — Encounter (HOSPITAL_COMMUNITY): Payer: Medicaid Other

## 2018-04-12 ENCOUNTER — Other Ambulatory Visit (HOSPITAL_COMMUNITY): Payer: Self-pay

## 2018-04-12 ENCOUNTER — Other Ambulatory Visit (HOSPITAL_COMMUNITY): Payer: Self-pay | Admitting: Cardiology

## 2018-04-12 MED ORDER — TORSEMIDE 20 MG PO TABS: 40.0000 mg | ORAL_TABLET | Freq: Two times a day (BID) | ORAL | 3 refills | Status: DC

## 2018-04-12 MED FILL — TORSEMIDE 20 MG TABLET: 20 | 30 days supply | Qty: 120 | Fill #0

## 2018-04-15 ENCOUNTER — Other Ambulatory Visit: Payer: Self-pay

## 2018-04-15 ENCOUNTER — Encounter (HOSPITAL_COMMUNITY): Payer: Self-pay

## 2018-04-15 ENCOUNTER — Ambulatory Visit (HOSPITAL_COMMUNITY)
Admission: RE | Admit: 2018-04-15 | Discharge: 2018-04-15 | Disposition: A | Discharge status: Home / Self Care | Payer: Medicaid Other | Source: Ambulatory Visit | Attending: Cardiology | Admitting: Cardiology

## 2018-04-15 VITALS — BP 180/94 | HR 88 | Wt 249.2 lb

## 2018-04-15 DIAGNOSIS — I2510 Atherosclerotic heart disease of native coronary artery without angina pectoris: Secondary | ICD-10-CM | POA: Diagnosis not present

## 2018-04-15 DIAGNOSIS — I130 Hypertensive heart and chronic kidney disease with heart failure and stage 1 through stage 4 chronic kidney disease, or unspecified chronic kidney disease: Secondary | ICD-10-CM | POA: Diagnosis not present

## 2018-04-15 DIAGNOSIS — Z8249 Family history of ischemic heart disease and other diseases of the circulatory system: Secondary | ICD-10-CM | POA: Diagnosis not present

## 2018-04-15 DIAGNOSIS — I5022 Chronic systolic (congestive) heart failure: Secondary | ICD-10-CM | POA: Insufficient documentation

## 2018-04-15 DIAGNOSIS — Z79899 Other long term (current) drug therapy: Secondary | ICD-10-CM | POA: Diagnosis not present

## 2018-04-15 DIAGNOSIS — E782 Mixed hyperlipidemia: Secondary | ICD-10-CM

## 2018-04-15 DIAGNOSIS — Z9114 Patient's other noncompliance with medication regimen: Secondary | ICD-10-CM | POA: Insufficient documentation

## 2018-04-15 DIAGNOSIS — I255 Ischemic cardiomyopathy: Secondary | ICD-10-CM | POA: Diagnosis not present

## 2018-04-15 DIAGNOSIS — F17210 Nicotine dependence, cigarettes, uncomplicated: Secondary | ICD-10-CM | POA: Insufficient documentation

## 2018-04-15 DIAGNOSIS — N183 Chronic kidney disease, stage 3 unspecified: Secondary | ICD-10-CM

## 2018-04-15 DIAGNOSIS — Z951 Presence of aortocoronary bypass graft: Secondary | ICD-10-CM | POA: Diagnosis not present

## 2018-04-15 DIAGNOSIS — I5042 Chronic combined systolic (congestive) and diastolic (congestive) heart failure: Secondary | ICD-10-CM | POA: Diagnosis not present

## 2018-04-15 DIAGNOSIS — Z7982 Long term (current) use of aspirin: Secondary | ICD-10-CM | POA: Insufficient documentation

## 2018-04-15 DIAGNOSIS — I10 Essential (primary) hypertension: Secondary | ICD-10-CM

## 2018-04-15 LAB — BASIC METABOLIC PANEL
ANION GAP: 10 (ref 5–15)
BUN: 17 mg/dL (ref 6–20)
CALCIUM: 8.7 mg/dL — AB (ref 8.9–10.3)
CHLORIDE: 103 mmol/L (ref 98–111)
CO2: 24 mmol/L (ref 22–32)
Creatinine, Ser: 1.94 mg/dL — ABNORMAL HIGH (ref 0.61–1.24)
GFR calc non Af Amer: 40 mL/min — ABNORMAL LOW (ref >60)
GFR, EST AFRICAN AMERICAN: 46 mL/min — AB (ref >60)
GLUCOSE: 105 mg/dL — AB (ref 70–99)
POTASSIUM: 3.6 mmol/L (ref 3.5–5.1)
Sodium: 137 mmol/L (ref 135–145)

## 2018-04-15 MED ORDER — SPIRONOLACTONE 25 MG PO TABS: 12.5000 mg | ORAL_TABLET | Freq: Every day | ORAL | 3 refills | Status: DC

## 2018-04-15 MED ORDER — TORSEMIDE 20 MG PO TABS: 60.0000 mg | ORAL_TABLET | Freq: Every day | ORAL | 6 refills | Status: DC

## 2018-04-15 MED ORDER — CARVEDILOL 25 MG PO TABS: 25.0000 mg | ORAL_TABLET | Freq: Two times a day (BID) | ORAL | 6 refills | Status: DC

## 2018-04-15 NOTE — Patient Instructions (Addendum)
Labs done today  RESUME torsemide 60 mg (3 tabs) daily  RESUME spironolactone 12.5mg  ( 0.5 tab) daily  INCREASE Carvedilol 25 mg (1 tab) twice daily
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
## 2018-04-22 ENCOUNTER — Encounter (HOSPITAL_COMMUNITY): Payer: Medicaid Other

## 2018-04-29 ENCOUNTER — Telehealth: Payer: Self-pay | Admitting: Pharmacist

## 2018-04-29 NOTE — Telephone Encounter (Signed)
Received message from CHF clinic that pt has been without Repatha as he was waiting on [place] to do paperwork. Called pt as per our records prior authorization is good through 07/04/18.   He states that he still has same insurance. Advised he call [facility] to have Repatha refilled. Provided him the phone number again. He is also aware to call once he restarts as we will need to get labs on therapy for reauthorization in Feb.
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
## 2018-05-14 ENCOUNTER — Ambulatory Visit (HOSPITAL_COMMUNITY)
Admission: RE | Admit: 2018-05-14 | Discharge: 2018-05-14 | Disposition: A | Discharge status: Home / Self Care | Payer: Medicaid Other | Source: Ambulatory Visit | Attending: Cardiology | Admitting: Cardiology

## 2018-05-14 ENCOUNTER — Other Ambulatory Visit: Payer: Self-pay

## 2018-05-14 VITALS — BP 162/90 | HR 77 | Wt 231.5 lb

## 2018-05-14 DIAGNOSIS — E669 Obesity, unspecified: Secondary | ICD-10-CM | POA: Diagnosis not present

## 2018-05-14 DIAGNOSIS — F17210 Nicotine dependence, cigarettes, uncomplicated: Secondary | ICD-10-CM | POA: Insufficient documentation

## 2018-05-14 DIAGNOSIS — Z951 Presence of aortocoronary bypass graft: Secondary | ICD-10-CM | POA: Insufficient documentation

## 2018-05-14 DIAGNOSIS — Z79899 Other long term (current) drug therapy: Secondary | ICD-10-CM | POA: Insufficient documentation

## 2018-05-14 DIAGNOSIS — Z7982 Long term (current) use of aspirin: Secondary | ICD-10-CM | POA: Diagnosis not present

## 2018-05-14 DIAGNOSIS — N183 Chronic kidney disease, stage 3 unspecified: Secondary | ICD-10-CM

## 2018-05-14 DIAGNOSIS — I5042 Chronic combined systolic (congestive) and diastolic (congestive) heart failure: Secondary | ICD-10-CM

## 2018-05-14 DIAGNOSIS — Z720 Tobacco use: Secondary | ICD-10-CM

## 2018-05-14 DIAGNOSIS — R0683 Snoring: Secondary | ICD-10-CM

## 2018-05-14 DIAGNOSIS — I255 Ischemic cardiomyopathy: Secondary | ICD-10-CM | POA: Diagnosis not present

## 2018-05-14 DIAGNOSIS — Z8249 Family history of ischemic heart disease and other diseases of the circulatory system: Secondary | ICD-10-CM | POA: Insufficient documentation

## 2018-05-14 DIAGNOSIS — I130 Hypertensive heart and chronic kidney disease with heart failure and stage 1 through stage 4 chronic kidney disease, or unspecified chronic kidney disease: Secondary | ICD-10-CM | POA: Diagnosis not present

## 2018-05-14 DIAGNOSIS — I5022 Chronic systolic (congestive) heart failure: Secondary | ICD-10-CM | POA: Diagnosis present

## 2018-05-14 DIAGNOSIS — I10 Essential (primary) hypertension: Secondary | ICD-10-CM

## 2018-05-14 DIAGNOSIS — I2510 Atherosclerotic heart disease of native coronary artery without angina pectoris: Secondary | ICD-10-CM | POA: Diagnosis not present

## 2018-05-14 LAB — BASIC METABOLIC PANEL
Anion gap: 12 (ref 5–15)
BUN: 17 mg/dL (ref 6–20)
CHLORIDE: 98 mmol/L (ref 98–111)
CO2: 27 mmol/L (ref 22–32)
Calcium: 9.3 mg/dL (ref 8.9–10.3)
Creatinine, Ser: 2.03 mg/dL — ABNORMAL HIGH (ref 0.61–1.24)
GFR calc Af Amer: 44 mL/min — ABNORMAL LOW (ref >60)
GFR calc non Af Amer: 38 mL/min — ABNORMAL LOW (ref >60)
Glucose, Bld: 83 mg/dL (ref 70–99)
Potassium: 3.1 mmol/L — ABNORMAL LOW (ref 3.5–5.1)
Sodium: 137 mmol/L (ref 135–145)

## 2018-05-14 MED ORDER — HYDRALAZINE HCL 100 MG PO TABS: 100.0000 mg | ORAL_TABLET | Freq: Three times a day (TID) | ORAL | 3 refills | Status: DC

## 2018-05-14 MED ORDER — NICOTINE 21 MG/24HR TD PT24: 21.0000 mg | MEDICATED_PATCH | TRANSDERMAL | 0 refills | Status: DC

## 2018-05-14 MED ORDER — CARVEDILOL 25 MG PO TABS: 12.5000 mg | ORAL_TABLET | Freq: Two times a day (BID) | ORAL | 6 refills | Status: DC

## 2018-05-14 MED ORDER — SPIRONOLACTONE 25 MG PO TABS: 12.5000 mg | ORAL_TABLET | Freq: Every day | ORAL | 3 refills | Status: DC

## 2018-05-14 MED ORDER — NICOTINE 21 MG/24HR TD PT24: 21.0000 mg | MEDICATED_PATCH | TRANSDERMAL | 0 refills | Status: AC

## 2018-05-14 MED FILL — SPIRONOLACTONE 25 MG TABLET: 25 | 60 days supply | Qty: 30 | Fill #0

## 2018-05-14 MED FILL — CARVEDILOL 25 MG TABLET: 25 | 45 days supply | Qty: 45 | Fill #0

## 2018-05-14 MED FILL — TORSEMIDE 20 MG TABLET: 20 | 30 days supply | Qty: 150 | Fill #0

## 2018-05-14 MED FILL — hydrALAZINE HCL 100 MG TABS: 100 | 30 days supply | Qty: 90 | Fill #0

## 2018-05-14 NOTE — Patient Instructions (Signed)
Labs done today  Labs will need to be done in 1 week  RESTART Coreg 12.5mg  twice daily  RESTART Hydralazine 100mg  three times daily  RESTART Spironolactone 12.5mg  (0.5 tab) daily  Your physician has recommended that you have a sleep study. This test records several body functions during sleep, including: brain activity, eye movement, oxygen and carbon dioxide blood levels, heart rate and rhythm, breathing rate and rhythm, the flow of air through your mouth and nose, snoring, body muscle movements, and chest and belly movement.  STOP Imdur  Follow up with our Pharmacist in 4 weeks  Follow up with the Advanced Practice Providers in 8 weeks

## 2018-05-21 ENCOUNTER — Other Ambulatory Visit (HOSPITAL_COMMUNITY): Payer: Medicaid Other

## 2018-05-22 ENCOUNTER — Other Ambulatory Visit (HOSPITAL_COMMUNITY): Payer: Medicaid Other
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
## 2018-06-10 ENCOUNTER — Ambulatory Visit (HOSPITAL_COMMUNITY)
Admission: RE | Admit: 2018-06-10 | Discharge: 2018-06-10 | Disposition: A | Discharge status: Home / Self Care | Payer: Medicaid Other | Source: Ambulatory Visit | Attending: Internal Medicine | Admitting: Internal Medicine

## 2018-06-10 VITALS — BP 160/110 | HR 70 | Wt 241.6 lb

## 2018-06-10 DIAGNOSIS — Z951 Presence of aortocoronary bypass graft: Secondary | ICD-10-CM | POA: Diagnosis not present

## 2018-06-10 DIAGNOSIS — I5022 Chronic systolic (congestive) heart failure: Secondary | ICD-10-CM | POA: Diagnosis not present

## 2018-06-10 DIAGNOSIS — I130 Hypertensive heart and chronic kidney disease with heart failure and stage 1 through stage 4 chronic kidney disease, or unspecified chronic kidney disease: Secondary | ICD-10-CM | POA: Insufficient documentation

## 2018-06-10 DIAGNOSIS — I5042 Chronic combined systolic (congestive) and diastolic (congestive) heart failure: Secondary | ICD-10-CM

## 2018-06-10 DIAGNOSIS — I255 Ischemic cardiomyopathy: Secondary | ICD-10-CM | POA: Diagnosis not present

## 2018-06-10 DIAGNOSIS — N183 Chronic kidney disease, stage 3 (moderate): Secondary | ICD-10-CM | POA: Insufficient documentation

## 2018-06-10 DIAGNOSIS — I2510 Atherosclerotic heart disease of native coronary artery without angina pectoris: Secondary | ICD-10-CM | POA: Insufficient documentation

## 2018-06-10 DIAGNOSIS — Z79899 Other long term (current) drug therapy: Secondary | ICD-10-CM | POA: Insufficient documentation

## 2018-06-10 DIAGNOSIS — F17200 Nicotine dependence, unspecified, uncomplicated: Secondary | ICD-10-CM | POA: Insufficient documentation

## 2018-06-10 LAB — BASIC METABOLIC PANEL
Anion gap: 12 (ref 5–15)
BUN: 16 mg/dL (ref 6–20)
CO2: 24 mmol/L (ref 22–32)
CREATININE: 1.83 mg/dL — AB (ref 0.61–1.24)
Calcium: 8.9 mg/dL (ref 8.9–10.3)
Chloride: 104 mmol/L (ref 98–111)
GFR calc Af Amer: 50 mL/min — ABNORMAL LOW (ref >60)
GFR calc non Af Amer: 43 mL/min — ABNORMAL LOW (ref >60)
Glucose, Bld: 96 mg/dL (ref 70–99)
Potassium: 3.2 mmol/L — ABNORMAL LOW (ref 3.5–5.1)
Sodium: 140 mmol/L (ref 135–145)

## 2018-06-10 MED ORDER — SPIRONOLACTONE 25 MG PO TABS: 25.0000 mg | ORAL_TABLET | Freq: Every day | ORAL | 3 refills | Status: DC

## 2018-06-10 NOTE — Patient Instructions (Signed)
It was nice meeting you today in the Heart failure clinic at [facility]    Medication changes: Depending on your labs, we may increase your spironolactone to 25 mg daily, and increase your carvedilol to 25 mg twice daily.    You had Lab work done today:   Scheduled Lab work: next week     Follow up with PA/NP in one month.   Do the following things EVERYDAY: 1. Weigh yourself in the morning before breakfast. Write it down and keep it in a log. 2. Take your medicines as prescribed 3. Eat low salt foods-Limit salt (sodium) to 2000 mg per day.  4. Stay as active as you can everyday 5. Limit all fluids for the day to less than 2 liters

## 2018-06-11 ENCOUNTER — Telehealth: Payer: Self-pay | Admitting: Pharmacist

## 2018-06-11 MED ORDER — EVOLOCUMAB 140 MG/ML ~~LOC~~ SOAJ: 140.0000 mg | SUBCUTANEOUS | 11 refills | Status: DC

## 2018-06-11 NOTE — Telephone Encounter (Signed)
Appeals for Repatha has been approved. Rx refill sent to local pharmacy to see if pt is able to obtain refills easier than with mail order. He is aware and will call with any concerns.

## 2018-06-11 NOTE — Telephone Encounter (Signed)
Spoke with [employer] rep who states that pt was able to get medication previously, but now claim is being rejected and shows as 'prescriber not enrolled in state.'  She provided me phone [phone] for plan to call.  Called above line - and spoke with Karle Starch, representative with [facility] who states that Dr. Claris Gladden status with the health plan has changed and he is no longer listed as a provider - therefore voiding the approved PA on file (she can see this was approved through 07/04/18 of this month). She states the provider will need to call [phone] to have have him re-established as a provider with this plan.   Spoke with Billing team who advised that Laverna Peace [phone] usually helps with physician credentialing. LMOM  Will route info to her in Epic as well.

## 2018-06-11 NOTE — Telephone Encounter (Signed)
-----   Message from Wyatt Haste, [facility] sent at 06/10/2018  5:39 PM EST ----- Regarding: Repatha f/u I saw this patient in HF clinic today. Georgina Peer you had been alerted to him previously at the end of December:"Received message from CHF clinic that pt has been without Repatha as he was waiting on [place] to do paperwork. Called pt as per our records prior authorization is good through 07/04/18.   He states that he still has same insurance. Advised he call [employer] to have Repatha refilled. Provided him the phone number again. He is also aware to call once he restarts as we will need to get labs on therapy for reauthorization in Feb. "   He says that he called and they were requesting paperwork? I thought I'd realert since his PA will expire at the end of this month. He remained off Repatha. I will be checking labs next Tuesday for him, we can add on a lipid panel if you guys need that for the PA. -Raquel Sarna

## 2018-06-12 MED ORDER — POTASSIUM CHLORIDE CRYS ER 20 MEQ PO TBCR: 40.0000 meq | EXTENDED_RELEASE_TABLET | Freq: Once | ORAL | 0 refills | Status: DC

## 2018-06-12 MED FILL — POTASSIUM CHLORIDE CRYS ER: 20 | 1 days supply | Qty: 2 | Fill #0

## 2018-06-18 ENCOUNTER — Other Ambulatory Visit (HOSPITAL_COMMUNITY): Payer: Medicaid Other

## 2018-06-19 ENCOUNTER — Ambulatory Visit (HOSPITAL_COMMUNITY)
Admission: RE | Admit: 2018-06-19 | Discharge: 2018-06-19 | Disposition: A | Discharge status: Home / Self Care | Payer: Medicaid Other | Source: Ambulatory Visit | Attending: Cardiology | Admitting: Cardiology

## 2018-06-19 DIAGNOSIS — I5042 Chronic combined systolic (congestive) and diastolic (congestive) heart failure: Secondary | ICD-10-CM | POA: Insufficient documentation

## 2018-06-19 LAB — BASIC METABOLIC PANEL
Anion gap: 12 (ref 5–15)
BUN: 25 mg/dL — ABNORMAL HIGH (ref 6–20)
CO2: 25 mmol/L (ref 22–32)
Calcium: 9 mg/dL (ref 8.9–10.3)
Chloride: 99 mmol/L (ref 98–111)
Creatinine, Ser: 2.07 mg/dL — ABNORMAL HIGH (ref 0.61–1.24)
GFR calc Af Amer: 43 mL/min — ABNORMAL LOW (ref >60)
GFR calc non Af Amer: 37 mL/min — ABNORMAL LOW (ref >60)
Glucose, Bld: 102 mg/dL — ABNORMAL HIGH (ref 70–99)
Potassium: 3.8 mmol/L (ref 3.5–5.1)
SODIUM: 136 mmol/L (ref 135–145)

## 2018-06-21 ENCOUNTER — Telehealth: Payer: Self-pay

## 2018-06-21 NOTE — Telephone Encounter (Signed)
Called pt to see if he had been able to pick up the repatha med and pt stated that the [place] [place] outpatient pharmacy didn't have it when he went to pick up his other meds

## 2018-06-21 NOTE — Telephone Encounter (Signed)
Received fax again today that Dr. [name] is not a provider with Pts insurance plan. Called [facility] to inquire how pt getting other medicatons and they report that he has been paying cash for the prescriptions. Followed-up with Deveron Furlong who is investigating further. She should report to [place] when this has been corrected.

## 2018-07-04 ENCOUNTER — Ambulatory Visit (HOSPITAL_BASED_OUTPATIENT_CLINIC_OR_DEPARTMENT_OTHER): Payer: Medicaid Other | Attending: Cardiology

## 2018-07-09 ENCOUNTER — Encounter (HOSPITAL_COMMUNITY): Payer: Medicaid Other

## 2018-08-16 MED FILL — hydrALAZINE HCL 100 MG TABS: 100 | 30 days supply | Qty: 90 | Fill #0

## 2018-08-27 MED FILL — SPIRONOLACTONE 25 MG TABS: 25 | 90 days supply | Qty: 90 | Fill #0

## 2018-08-27 MED FILL — CARVEDILOL 25 MG TABLET: 25 | 45 days supply | Qty: 45 | Fill #0

## 2018-08-27 MED FILL — TORSEMIDE 20 MG TABLET: 20 | 30 days supply | Qty: 150 | Fill #0

## 2018-10-08 ENCOUNTER — Other Ambulatory Visit: Payer: Self-pay | Admitting: Pharmacist

## 2018-10-08 MED ORDER — EVOLOCUMAB 140 MG/ML ~~LOC~~ SOAJ: 140.0000 mg | SUBCUTANEOUS | 11 refills | Status: DC

## 2018-11-15 MED FILL — TORSEMIDE 20 MG TABLET: 20 | 30 days supply | Qty: 150 | Fill #0

## 2019-01-10 MED FILL — TORSEMIDE 20 MG TABLET: 20 | 30 days supply | Qty: 150 | Fill #1

## 2019-02-21 MED FILL — TORSEMIDE 20 MG TABLET: 20 | 30 days supply | Qty: 150 | Fill #2

## 2019-04-16 ENCOUNTER — Other Ambulatory Visit (HOSPITAL_COMMUNITY): Payer: Self-pay | Admitting: Student

## 2019-04-16 NOTE — Telephone Encounter (Signed)
This is a CHF pt 

## 2019-04-28 ENCOUNTER — Other Ambulatory Visit (HOSPITAL_COMMUNITY): Payer: Self-pay | Admitting: *Deleted

## 2019-04-28 MED ORDER — TORSEMIDE 20 MG PO TABS: 60.0000 mg | ORAL_TABLET | Freq: Every day | ORAL | 6 refills | Status: DC

## 2019-05-13 ENCOUNTER — Other Ambulatory Visit (HOSPITAL_COMMUNITY): Payer: Self-pay | Admitting: *Deleted

## 2019-05-13 MED ORDER — TORSEMIDE 20 MG PO TABS: 60.0000 mg | ORAL_TABLET | Freq: Every day | ORAL | 3 refills | Status: DC

## 2019-05-13 MED FILL — TORSEMIDE 20 MG TABLET: 20 | 30 days supply | Qty: 150 | Fill #0

## 2019-06-02 ENCOUNTER — Encounter (HOSPITAL_COMMUNITY): Payer: Self-pay

## 2019-06-02 ENCOUNTER — Ambulatory Visit (HOSPITAL_COMMUNITY)
Admission: RE | Admit: 2019-06-02 | Discharge: 2019-06-02 | Disposition: A | Discharge status: Home / Self Care | Payer: Medicaid Other | Source: Ambulatory Visit | Attending: Adult Health | Admitting: Adult Health

## 2019-06-02 ENCOUNTER — Other Ambulatory Visit: Payer: Self-pay

## 2019-06-02 VITALS — BP 196/134 | HR 76 | Wt 224.2 lb

## 2019-06-02 DIAGNOSIS — I5042 Chronic combined systolic (congestive) and diastolic (congestive) heart failure: Secondary | ICD-10-CM

## 2019-06-02 DIAGNOSIS — N183 Chronic kidney disease, stage 3 unspecified: Secondary | ICD-10-CM | POA: Diagnosis not present

## 2019-06-02 DIAGNOSIS — Z7982 Long term (current) use of aspirin: Secondary | ICD-10-CM | POA: Diagnosis not present

## 2019-06-02 DIAGNOSIS — Z8249 Family history of ischemic heart disease and other diseases of the circulatory system: Secondary | ICD-10-CM | POA: Diagnosis not present

## 2019-06-02 DIAGNOSIS — I130 Hypertensive heart and chronic kidney disease with heart failure and stage 1 through stage 4 chronic kidney disease, or unspecified chronic kidney disease: Secondary | ICD-10-CM | POA: Diagnosis not present

## 2019-06-02 DIAGNOSIS — Z951 Presence of aortocoronary bypass graft: Secondary | ICD-10-CM | POA: Insufficient documentation

## 2019-06-02 DIAGNOSIS — F17210 Nicotine dependence, cigarettes, uncomplicated: Secondary | ICD-10-CM | POA: Insufficient documentation

## 2019-06-02 DIAGNOSIS — I10 Essential (primary) hypertension: Secondary | ICD-10-CM | POA: Diagnosis not present

## 2019-06-02 DIAGNOSIS — I25810 Atherosclerosis of coronary artery bypass graft(s) without angina pectoris: Secondary | ICD-10-CM | POA: Diagnosis not present

## 2019-06-02 DIAGNOSIS — Z79899 Other long term (current) drug therapy: Secondary | ICD-10-CM | POA: Diagnosis not present

## 2019-06-02 DIAGNOSIS — N1832 Chronic kidney disease, stage 3b: Secondary | ICD-10-CM

## 2019-06-02 DIAGNOSIS — I5022 Chronic systolic (congestive) heart failure: Secondary | ICD-10-CM | POA: Insufficient documentation

## 2019-06-02 DIAGNOSIS — I2510 Atherosclerotic heart disease of native coronary artery without angina pectoris: Secondary | ICD-10-CM

## 2019-06-02 DIAGNOSIS — I255 Ischemic cardiomyopathy: Secondary | ICD-10-CM | POA: Insufficient documentation

## 2019-06-02 DIAGNOSIS — Z720 Tobacco use: Secondary | ICD-10-CM

## 2019-06-02 LAB — COMPREHENSIVE METABOLIC PANEL
ALT: 33 U/L (ref 0–44)
AST: 44 U/L — ABNORMAL HIGH (ref 15–41)
Albumin: 3.5 g/dL (ref 3.5–5.0)
Alkaline Phosphatase: 74 U/L (ref 38–126)
Anion gap: 14 (ref 5–15)
BUN: 18 mg/dL (ref 6–20)
CO2: 25 mmol/L (ref 22–32)
Calcium: 8.8 mg/dL — ABNORMAL LOW (ref 8.9–10.3)
Chloride: 99 mmol/L (ref 98–111)
Creatinine, Ser: 2.15 mg/dL — ABNORMAL HIGH (ref 0.61–1.24)
GFR calc Af Amer: 41 mL/min — ABNORMAL LOW (ref >60)
GFR calc non Af Amer: 35 mL/min — ABNORMAL LOW (ref >60)
Glucose, Bld: 105 mg/dL — ABNORMAL HIGH (ref 70–99)
Potassium: 2.8 mmol/L — ABNORMAL LOW (ref 3.5–5.1)
Sodium: 138 mmol/L (ref 135–145)
Total Bilirubin: 0.8 mg/dL (ref 0.3–1.2)
Total Protein: 7.8 g/dL (ref 6.5–8.1)

## 2019-06-02 LAB — LIPID PANEL
Cholesterol: 267 mg/dL — ABNORMAL HIGH (ref 0–200)
HDL: 54 mg/dL (ref >40)
LDL Cholesterol: 193 mg/dL — ABNORMAL HIGH (ref 0–99)
Total CHOL/HDL Ratio: 4.9 RATIO
Triglycerides: 102 mg/dL (ref <150)
VLDL: 20 mg/dL (ref 0–40)

## 2019-06-02 MED ORDER — HYDRALAZINE HCL 100 MG PO TABS: 100.0000 mg | ORAL_TABLET | Freq: Three times a day (TID) | ORAL | 3 refills | Status: DC

## 2019-06-02 MED ORDER — CARVEDILOL 12.5 MG PO TABS: 12.5000 mg | ORAL_TABLET | Freq: Two times a day (BID) | ORAL | 3 refills | Status: DC

## 2019-06-02 MED FILL — CARVEDILOL 12.5 MG TABLET: 12.5 | 30 days supply | Qty: 60 | Fill #0

## 2019-06-02 MED FILL — hydrALAZINE HCL 100 MG TABS: 100 | 30 days supply | Qty: 90 | Fill #0

## 2019-06-02 NOTE — Addendum Note (Signed)
Encounter addended by: [name], LCSW on: 06/02/2019 11:51 AM  Actions taken: Clinical Note Signed

## 2019-06-02 NOTE — Patient Instructions (Signed)
Lab work done today. We will notify you of any abnormal lab work. No news is good news!  Your physician has requested that you have an echocardiogram. Echocardiography is a painless test that uses sound waves to create images of your heart. It provides your doctor with information about the size and shape of your heart and how well your heart's chambers and valves are working. This procedure takes approximately one hour. There are no restrictions for this procedure.  START Carvedilol 12.5mg  tab twice daily.  START Hydralazine 100mg  tab three times daily.  You have been referred to our heart failure paramedicine program.  Please follow up with the [facility] in 2 weeks.  At the [facility], you and your health needs are our priority. As part of our continuing mission to provide you with exceptional heart care, we have created designated Provider Care Teams. These Care Teams include your primary Cardiologist (physician) and Advanced Practice Providers (APPs- Physician Assistants and Nurse Practitioners) who all work together to provide you with the care you need, when you need it.   You may see any of the following providers on your designated Care Team at your next follow up: Marland Kitchen Dr Glori Bickers . Dr Loralie Champagne . Darrick Grinder, NP . Lyda Jester, PA . Audry Riles, PharmD   Please be sure to bring in all your medications bottles to every appointment.
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
## 2019-06-05 ENCOUNTER — Telehealth (HOSPITAL_COMMUNITY): Payer: Self-pay | Admitting: Cardiology

## 2019-06-05 DIAGNOSIS — I2510 Atherosclerotic heart disease of native coronary artery without angina pectoris: Secondary | ICD-10-CM

## 2019-06-05 DIAGNOSIS — I5042 Chronic combined systolic (congestive) and diastolic (congestive) heart failure: Secondary | ICD-10-CM

## 2019-06-05 MED ORDER — POTASSIUM CHLORIDE CRYS ER 20 MEQ PO TBCR: EXTENDED_RELEASE_TABLET | ORAL | 6 refills | Status: DC

## 2019-06-05 MED FILL — POTASSIUM CHLORIDE CRYS ER: 20 | 31 days supply | Qty: 63 | Fill #0

## 2019-06-05 NOTE — Telephone Encounter (Signed)
Pt aware and voiced understanding Referral for lipid clinic placed Medications sent    Kerry Dory, [place]  06/02/2019 4:17 PM EST    [phone] (M) LMOM    Clegg, Amy D, NP  06/02/2019 1:00 PM EST    Refer back to lipid clinic for repatha. Unable to atorvastatin.   Potassium low. Needs to take 60 meq potassium today then start 40 meq potassium daily.

## 2019-06-05 NOTE — Telephone Encounter (Signed)
-----   Message from Conrad Moultrie, NP sent at 06/02/2019  1:00 PM EST ----- Refer back to lipid clinic for repatha. Unable to atorvastatin.   Potassium low. Needs to take 60 meq potassium today then start 40 meq potassium daily.

## 2019-06-09 ENCOUNTER — Other Ambulatory Visit: Payer: Self-pay

## 2019-06-09 ENCOUNTER — Ambulatory Visit (HOSPITAL_COMMUNITY)
Admission: RE | Admit: 2019-06-09 | Discharge: 2019-06-09 | Disposition: A | Discharge status: Home / Self Care | Payer: Medicaid Other | Source: Ambulatory Visit | Attending: Adult Health | Admitting: Adult Health

## 2019-06-09 DIAGNOSIS — Z951 Presence of aortocoronary bypass graft: Secondary | ICD-10-CM | POA: Insufficient documentation

## 2019-06-09 DIAGNOSIS — I110 Hypertensive heart disease with heart failure: Secondary | ICD-10-CM | POA: Diagnosis not present

## 2019-06-09 DIAGNOSIS — I509 Heart failure, unspecified: Secondary | ICD-10-CM | POA: Insufficient documentation

## 2019-06-09 DIAGNOSIS — I2510 Atherosclerotic heart disease of native coronary artery without angina pectoris: Secondary | ICD-10-CM | POA: Insufficient documentation

## 2019-06-09 DIAGNOSIS — E785 Hyperlipidemia, unspecified: Secondary | ICD-10-CM | POA: Insufficient documentation

## 2019-06-09 DIAGNOSIS — I5042 Chronic combined systolic (congestive) and diastolic (congestive) heart failure: Secondary | ICD-10-CM

## 2019-06-09 NOTE — Progress Notes (Signed)
  Echocardiogram 2D Echocardiogram has been performed.  Jennette Dubin 06/09/2019, 11:45 AM

## 2019-06-10 ENCOUNTER — Ambulatory Visit: Payer: Medicaid Other | Admitting: Interventional Cardiology

## 2019-06-13 ENCOUNTER — Ambulatory Visit: Payer: Medicaid Other | Admitting: Internal Medicine

## 2019-06-17 ENCOUNTER — Telehealth (HOSPITAL_COMMUNITY): Payer: Self-pay

## 2019-06-17 ENCOUNTER — Encounter (HOSPITAL_COMMUNITY): Payer: Self-pay

## 2019-06-17 ENCOUNTER — Other Ambulatory Visit: Payer: Self-pay

## 2019-06-17 ENCOUNTER — Ambulatory Visit (HOSPITAL_COMMUNITY)
Admission: RE | Admit: 2019-06-17 | Discharge: 2019-06-17 | Disposition: A | Discharge status: Home / Self Care | Payer: Medicaid Other | Source: Ambulatory Visit | Attending: Cardiology | Admitting: Cardiology

## 2019-06-17 VITALS — BP 184/116 | HR 84 | Wt 224.6 lb

## 2019-06-17 DIAGNOSIS — Z720 Tobacco use: Secondary | ICD-10-CM | POA: Diagnosis not present

## 2019-06-17 DIAGNOSIS — I5022 Chronic systolic (congestive) heart failure: Secondary | ICD-10-CM | POA: Diagnosis not present

## 2019-06-17 DIAGNOSIS — I2510 Atherosclerotic heart disease of native coronary artery without angina pectoris: Secondary | ICD-10-CM | POA: Insufficient documentation

## 2019-06-17 DIAGNOSIS — I10 Essential (primary) hypertension: Secondary | ICD-10-CM

## 2019-06-17 DIAGNOSIS — I130 Hypertensive heart and chronic kidney disease with heart failure and stage 1 through stage 4 chronic kidney disease, or unspecified chronic kidney disease: Secondary | ICD-10-CM | POA: Insufficient documentation

## 2019-06-17 DIAGNOSIS — F17210 Nicotine dependence, cigarettes, uncomplicated: Secondary | ICD-10-CM | POA: Insufficient documentation

## 2019-06-17 DIAGNOSIS — Z951 Presence of aortocoronary bypass graft: Secondary | ICD-10-CM | POA: Insufficient documentation

## 2019-06-17 DIAGNOSIS — N1832 Chronic kidney disease, stage 3b: Secondary | ICD-10-CM

## 2019-06-17 DIAGNOSIS — I5042 Chronic combined systolic (congestive) and diastolic (congestive) heart failure: Secondary | ICD-10-CM

## 2019-06-17 DIAGNOSIS — Z8249 Family history of ischemic heart disease and other diseases of the circulatory system: Secondary | ICD-10-CM | POA: Diagnosis not present

## 2019-06-17 DIAGNOSIS — Z79899 Other long term (current) drug therapy: Secondary | ICD-10-CM | POA: Diagnosis not present

## 2019-06-17 DIAGNOSIS — I255 Ischemic cardiomyopathy: Secondary | ICD-10-CM | POA: Insufficient documentation

## 2019-06-17 DIAGNOSIS — Z7982 Long term (current) use of aspirin: Secondary | ICD-10-CM | POA: Diagnosis not present

## 2019-06-17 DIAGNOSIS — N183 Chronic kidney disease, stage 3 unspecified: Secondary | ICD-10-CM | POA: Insufficient documentation

## 2019-06-17 MED ORDER — POTASSIUM CHLORIDE CRYS ER 20 MEQ PO TBCR: EXTENDED_RELEASE_TABLET | ORAL | 6 refills | Status: DC

## 2019-06-17 MED ORDER — AMLODIPINE BESYLATE 2.5 MG PO TABS: 2.5000 mg | ORAL_TABLET | Freq: Every day | ORAL | 3 refills | Status: DC

## 2019-06-17 MED ORDER — CARVEDILOL 12.5 MG PO TABS: 18.7500 mg | ORAL_TABLET | Freq: Two times a day (BID) | ORAL | 2 refills | Status: DC

## 2019-06-17 MED FILL — POTASSIUM CHLORIDE CRYS ER: 20 | 30 days supply | Qty: 63 | Fill #0

## 2019-06-17 MED FILL — AMLODIPINE 2.5 MG TABLET: 2.5 | 90 days supply | Qty: 90 | Fill #0

## 2019-06-17 MED FILL — CARVEDILOL 12.5 MG TABLET: 12.5 | 30 days supply | Qty: 90 | Fill #0

## 2019-06-17 NOTE — Telephone Encounter (Signed)
Spoke to [employer] who agreed to initial home visit on Monday 2/15 at 1100.

## 2019-06-17 NOTE — Patient Instructions (Signed)
START Amlodipine 2.5mg  daily  INCREASE Carvedilol to 18.75mg  (1.5 tab) two times daily.  RESTART Potassium  Please follow up with the [facility] in 4 weeks.  At the [facility], you and your health needs are our priority. As part of our continuing mission to provide you with exceptional heart care, we have created designated Provider Care Teams. These Care Teams include your primary Cardiologist (physician) and Advanced Practice Providers (APPs- Physician Assistants and Nurse Practitioners) who all work together to provide you with the care you need, when you need it.   You may see any of the following providers on your designated Care Team at your next follow up: Marland Kitchen Dr Glori Bickers . Dr Loralie Champagne . Darrick Grinder, NP . Lyda Jester, PA . Audry Riles, PharmD   Please be sure to bring in all your medications bottles to every appointment.
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
## 2019-06-17 NOTE — Telephone Encounter (Signed)
Left message for Erma to contact me back for scheduling his initial CHP visit. Awaiting phone call, will continue to follow.

## 2019-06-19 ENCOUNTER — Encounter: Payer: Self-pay | Admitting: Internal Medicine

## 2019-06-23 ENCOUNTER — Other Ambulatory Visit (HOSPITAL_COMMUNITY): Payer: Self-pay

## 2019-06-23 MED FILL — TORSEMIDE 20 MG TABLET: 20 | 30 days supply | Qty: 150 | Fill #1

## 2019-06-23 NOTE — Progress Notes (Signed)
Paramedicine Encounter    Patient ID: Scott Salinas, male    DOB: December 13, 1970, 49 y.o.   MRN: [id number]   Patient Care Team: Patient, No Pcp Per as PCP - General ([place]) Scott Dresser, MD as PCP - Cardiology (Cardiology) Scott Ny, LCSW as Social Worker (Licensed Clinical Social Worker)  Patient Active Problem List   Diagnosis Date Noted  . Hypertensive urgency 02/09/2018  . HLD (hyperlipidemia) 02/12/2017  . Tobacco abuse 02/12/2017  . Obesity (BMI 30-39.9) 02/12/2017  . CAD (coronary artery disease)   . Chronic combined systolic and diastolic CHF (congestive heart failure) ([place]) 05/28/2016  . Prolonged Q-T interval on ECG 03/03/2016  . Right leg swelling 12/29/2015  . Drug abuse ([place]) 12/29/2015  . 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 Chronic narcotic use 07/26/2015  . Leg pain, right 07/26/2015  . 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 CKD (chronic kidney disease) stage 3, GFR 30-59 ml/min   . Coronary artery disease due to lipid rich plaque   . Accelerated hypertension 12/27/2014  . History of noncompliance with medical treatment 12/27/2014    Current Outpatient Medications:  .  amLODipine (NORVASC) 2.5 MG tablet, Take 1 tablet (2.5 mg total) by mouth daily., Disp: 90 tablet, Rfl: 3 .  aspirin EC 81 MG tablet, Take 81 mg by mouth daily., Disp: , Rfl:  .  carvedilol (COREG) 12.5 MG tablet, Take 1.5 tablets (18.75 mg total) by mouth 2 (two) times daily., Disp: 270 tablet, Rfl: 2 .  hydrALAZINE (APRESOLINE) 100 MG tablet, Take 1 tablet (100 mg total) by mouth 3 (three) times daily., Disp: 270 tablet, Rfl: 3 .  potassium chloride SA (KLOR-CON) 20 MEQ tablet, Take 3 tablets (60 mEq total) by mouth daily for 1 day, THEN 2 tablets (40 mEq total) daily., Disp: 63 tablet, Rfl: 6 .  torsemide (DEMADEX) 20 MG tablet, Take 3 tablets (60 mg total) by mouth daily. May take an additional 40mg  (2 tabs) as needed, Disp: 150 tablet, Rfl:  3 Allergies  Allergen Reactions  . Lisinopril Anaphylaxis and Swelling    Whole right side of face became swollen     Social History   Socioeconomic History  . Marital status: Single    Spouse name: Not on file  . Number of children: Not on file  . Years of education: Not on file  . Highest education level: Not on file  Occupational History  . Occupation: Disabled  Tobacco Use  . Smoking status: Current Every Day Smoker    Packs/day: 0.50    Years: 20.00    Pack years: 10.00    Types: Cigarettes  . Smokeless tobacco: Never Used  Substance and Sexual Activity  . Alcohol use: No    Alcohol/week: 0.0 standard drinks  . Drug use: Yes    Types: Marijuana    Comment: 12/30/2015 "have smoked it twice in last 3-4 months"  . Sexual activity: Not on file  Other Topics Concern  . Not on file  Social History Narrative   Pt lives with girlfriend   Social Determinants of Health   Financial Resource Strain:   . Difficulty of Paying Living Expenses: Not on file  Food Insecurity:   . Worried About Charity fundraiser in the Last Year: Not on file  . Ran Out of Food in the Last Year: Not on file  Transportation Needs:   . Lack of Transportation (Medical): Not on file  . Lack of Transportation (Non-Medical): Not  on file  Physical Activity:   . Days of Exercise per Week: Not on file  . Minutes of Exercise per Session: Not on file  Stress:   . Feeling of Stress : Not on file  Social Connections:   . Frequency of Communication with Friends and Family: Not on file  . Frequency of Social Gatherings with Friends and Family: Not on file  . Attends Religious Services: Not on file  . Active Member of Clubs or Organizations: Not on file  . Attends Archivist Meetings: Not on file  . Marital Status: Not on file  Intimate Partner Violence:   . Fear of Current or Ex-Partner: Not on file  . Emotionally Abused: Not on file  . Physically Abused: Not on file  . Sexually Abused:  Not on file    Physical Exam Vitals reviewed.  HENT:     Head: Normocephalic.     Nose: Nose normal.     Mouth/Throat:     Mouth: Mucous membranes are moist.  Eyes:     Pupils: Pupils are equal, round, and reactive to light.  Cardiovascular:     Rate and Rhythm: Normal rate and regular rhythm.     Pulses: Normal pulses.     Heart sounds: Normal heart sounds.  Pulmonary:     Effort: Pulmonary effort is normal.     Breath sounds: Normal breath sounds.  Abdominal:     General: Abdomen is flat.     Palpations: Abdomen is soft.  Musculoskeletal:        General: Normal range of motion.     Cervical back: Normal range of motion.     Right lower leg: No edema.     Left lower leg: No edema.  Skin:    General: Skin is warm and dry.     Capillary Refill: Capillary refill takes less than 2 seconds.  Neurological:     Mental Status: He is alert. Mental status is at baseline.     Arrived for home health visit today, Scott Salinas was seated in a chair alert and oriented with no complaints. Vitals obtained and are as noted in report. Medications were reviewed, no pill box filled per request of patient. Scott Salinas confirmed bi-weekly appointments on Tuesdays at 11am. Patient requested some Bayer ASA 81mg . I also called in Torsemide to pharmacy. Patient had no complaints of difficulty breathing or trouble sleeping or ambulating. Home visit complete.   Refills: Torsemide   Future Appointments  Date Time Provider [place]  07/15/2019  9:30 AM [facility] PA/NP [facility] None     ACTION: Home visit completed Next visit planned for two weeks

## 2019-07-10 ENCOUNTER — Telehealth: Payer: Self-pay | Admitting: Pharmacist

## 2019-07-10 ENCOUNTER — Telehealth (HOSPITAL_COMMUNITY): Payer: Self-pay

## 2019-07-10 NOTE — Telephone Encounter (Signed)
Received call from Jeris Penta, EMT regarding recommendation from CHF clinic for pt to resume Repatha injections. We had previously gotten this rx approved by his insurance, but he was unable to fill the prescription as he still has out of state Medicaid and none of our providers in [place] are listed as providers under his [place]. He needs to apply for [facility] since he resides in this state before his insurance will cover Repatha injections from a [place] provider. Nira Conn will advise pt of this as well. Will route to CHF clinic as an FYI.

## 2019-07-10 NOTE — Telephone Encounter (Signed)
Please see below. Any chance he can get [place] Medicaid.   Khadija Thier NP-C   10:32 AM

## 2019-07-10 NOTE — Telephone Encounter (Signed)
Spoke to [employer] who agreed to visit tomorrow. He asked about his Repatha medication and stated the cost at the pharmacy is $1000 and he cannot afford same. It appears a year ago [place] was assisting with getting him some medication assistance but no further was noted.    07/10/19 at 0815: Spoke to [place] with Pharmacy at [facility] who clarified that patient has out of state insurance and they cannot have him approved for medication assistance without [facility]. She suggested patient get placed on [place] medicaid and once he does they can assist with payment of [place].  I will explain same to patient today.

## 2019-07-14 ENCOUNTER — Telehealth (HOSPITAL_COMMUNITY): Payer: Self-pay

## 2019-07-14 NOTE — Telephone Encounter (Signed)
Spoke to Scott Salinas who was made aware of Repatha information that he will need [place] Medicaid for same. Patient stated he is not ready to switch his insurance at this time there for will not being paying for Repatha medication out of pocket either. I explained I would see him in clinic tomorrow or in home on Wednesday. Patient agreed. Call complete.

## 2019-07-15 ENCOUNTER — Telehealth (HOSPITAL_COMMUNITY): Payer: Self-pay

## 2019-07-15 ENCOUNTER — Encounter (HOSPITAL_COMMUNITY): Payer: Medicaid Other

## 2019-07-15 NOTE — Telephone Encounter (Signed)
Spoke to Scott Salinas who said he just woke up and stated he will not be coming into the clinic today. I advised him I would need to follow up with him in home tomorrow. He verbalized agreement. Call complete.

## 2019-07-21 ENCOUNTER — Telehealth (HOSPITAL_COMMUNITY): Payer: Self-pay | Admitting: Licensed Clinical Social Worker

## 2019-07-21 NOTE — Telephone Encounter (Signed)
CSW called pt to see if they received or been scheduled to receive the COVID-19 vaccine at this time.  Unable to reach-went straight to VM- left message requesting return call to discuss and provide resources to sign up.  [name], LCSW Clinical Social Worker Advanced Heart Failure Clinic Desk#: [phone] Cell#: [phone]

## 2019-07-22 ENCOUNTER — Telehealth (HOSPITAL_COMMUNITY): Payer: Self-pay

## 2019-07-22 NOTE — Telephone Encounter (Signed)
Patient attempted to be reached for home visit with no success. Phone went straight to voicemail. Will continue to follow.

## 2019-08-04 ENCOUNTER — Telehealth (HOSPITAL_COMMUNITY): Payer: Self-pay

## 2019-08-04 NOTE — Telephone Encounter (Signed)
Spoke to [place] about visit for [employer] and he stated he might be going out of town but he is going to call me Weds morning and let me know for sure. I will continue to follow.

## 2019-08-07 ENCOUNTER — Other Ambulatory Visit (HOSPITAL_COMMUNITY): Payer: Self-pay

## 2019-08-07 MED FILL — CARVEDILOL 12.5 MG TABLET: 12.5 | 30 days supply | Qty: 90 | Fill #1

## 2019-08-07 MED FILL — TORSEMIDE 20 MG TABLET: 20 | 30 days supply | Qty: 150 | Fill #2

## 2019-08-07 MED FILL — hydrALAZINE HCL 100 MG TABS: 100 | 30 days supply | Qty: 90 | Fill #1

## 2019-08-07 NOTE — Progress Notes (Signed)
Paramedicine Encounter    Patient ID: Scott Salinas, male    DOB: 08-Apr-1971, 49 y.o.   MRN: [id number]   Patient Care Team: Patient, No Pcp Per as PCP - General ([place]) Scott Dresser, MD as PCP - Cardiology (Cardiology) Scott Ny, LCSW as Social Worker (Licensed Clinical Social Worker)  Patient Active Problem List   Diagnosis Date Noted  . Hypertensive urgency 02/09/2018  . HLD (hyperlipidemia) 02/12/2017  . Tobacco abuse 02/12/2017  . Obesity (BMI 30-39.9) 02/12/2017  . CAD (coronary artery disease)   . Chronic combined systolic and diastolic CHF (congestive heart failure) ([place]) 05/28/2016  . Prolonged Q-T interval on ECG 03/03/2016  . Right leg swelling 12/29/2015  . Drug abuse ([place]) 12/29/2015  . 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 Chronic narcotic use 07/26/2015  . Leg pain, right 07/26/2015  . 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 CKD (chronic kidney disease) stage 3, GFR 30-59 ml/min   . Coronary artery disease due to lipid rich plaque   . Accelerated hypertension 12/27/2014  . History of noncompliance with medical treatment 12/27/2014    Current Outpatient Medications:  .  aspirin EC 81 MG tablet, Take 81 mg by mouth daily., Disp: , Rfl:  .  carvedilol (COREG) 12.5 MG tablet, Take 1.5 tablets (18.75 mg total) by mouth 2 (two) times daily., Disp: 270 tablet, Rfl: 2 .  hydrALAZINE (APRESOLINE) 100 MG tablet, Take 1 tablet (100 mg total) by mouth 3 (three) times daily., Disp: 270 tablet, Rfl: 3 .  torsemide (DEMADEX) 20 MG tablet, Take 3 tablets (60 mg total) by mouth daily. May take an additional 40mg  (2 tabs) as needed, Disp: 150 tablet, Rfl: 3 .  amLODipine (NORVASC) 2.5 MG tablet, Take 1 tablet (2.5 mg total) by mouth daily. (Patient not taking: Reported on 08/07/2019), Disp: 90 tablet, Rfl: 3 .  potassium chloride SA (KLOR-CON) 20 MEQ tablet, Take 3 tablets (60 mEq total) by mouth daily for 1 day, THEN 2 tablets (40 mEq  total) daily. (Patient not taking: Reported on 08/07/2019), Disp: 63 tablet, Rfl: 6 Allergies  Allergen Reactions  . Lisinopril Anaphylaxis and Swelling    Whole right side of face became swollen     Social History   Socioeconomic History  . Marital status: Single    Spouse name: Not on file  . Number of children: Not on file  . Years of education: Not on file  . Highest education level: Not on file  Occupational History  . Occupation: Disabled  Tobacco Use  . Smoking status: Current Every Day Smoker    Packs/day: 0.50    Years: 20.00    Pack years: 10.00    Types: Cigarettes  . Smokeless tobacco: Never Used  Substance and Sexual Activity  . Alcohol use: No    Alcohol/week: 0.0 standard drinks  . Drug use: Yes    Types: Marijuana    Comment: 12/30/2015 "have smoked it twice in last 3-4 months"  . Sexual activity: Not on file  Other Topics Concern  . Not on file  Social History Narrative   Pt lives with girlfriend   Social Determinants of Health   Financial Resource Strain:   . Difficulty of Paying Living Expenses:   Food Insecurity:   . Worried About Charity fundraiser in the Last Year:   . Arboriculturist in the Last Year:   Transportation Needs:   . Film/video editor (Medical):   Marland Kitchen Lack  of Transportation (Non-Medical):   Physical Activity:   . Days of Exercise per Week:   . Minutes of Exercise per Session:   Stress:   . Feeling of Stress :   Social Connections:   . Frequency of Communication with Friends and Family:   . Frequency of Social Gatherings with Friends and Family:   . Attends Religious Services:   . Active Member of Clubs or Organizations:   . Attends Archivist Meetings:   Marland Kitchen Marital Status:   Intimate Partner Violence:   . Fear of Current or Ex-Partner:   . Emotionally Abused:   Marland Kitchen Physically Abused:   . Sexually Abused:     Physical Exam Vitals reviewed.  Constitutional:      Appearance: He is normal weight.  HENT:      Head: Normocephalic.     Nose: Nose normal.     Mouth/Throat:     Mouth: Mucous membranes are moist.     Pharynx: Oropharynx is clear.  Eyes:     Pupils: Pupils are equal, round, and reactive to light.  Cardiovascular:     Rate and Rhythm: Normal rate and regular rhythm.     Pulses: Normal pulses.     Heart sounds: Normal heart sounds.  Pulmonary:     Effort: Pulmonary effort is normal.     Breath sounds: Normal breath sounds.  Abdominal:     General: Abdomen is flat.     Palpations: Abdomen is soft.  Musculoskeletal:        General: Normal range of motion.     Cervical back: Normal range of motion.     Right lower leg: No edema.     Left lower leg: No edema.  Skin:    General: Skin is warm and dry.     Capillary Refill: Capillary refill takes less than 2 seconds.  Neurological:     Mental Status: He is alert. Mental status is at baseline.  Psychiatric:        Mood and Affect: Mood normal.      Arrived for home visit for [facility] who was ambulating around his home without difficult breathing or pain. Scott Salinas reports doing well and having no issues with his health over the last few weeks. Scott Salinas says he has been taking his medications except for the Potassium. Scott Salinas said he does not need it and he eats bananas instead.  I educated patient on importance of taking his medications as prescribed. He stated he will keep doing what he is doing. Vitals were obtained and are as noted. No edema, lung sounds clear. Weight maintaining around same according to patient. Scott Salinas requested refills on a few medications. Patient does not want pill box, he takes same out of bottles. Scott Salinas agreed to monthly visits. Home visit complete. Will follow up 09/06/19.   Refills:  Carvedilol Torsemide Hydralazine   Weight- 220lbs   No future appointments.   ACTION: Home visit completed Next visit planned for one month. 5/1

## 2019-09-10 ENCOUNTER — Telehealth (HOSPITAL_COMMUNITY): Payer: Self-pay

## 2019-09-10 NOTE — Telephone Encounter (Signed)
Attempted to call Scott Salinas for home visit. No answer. Will continue to attempt.

## 2019-09-22 ENCOUNTER — Telehealth (HOSPITAL_COMMUNITY): Payer: Self-pay | Admitting: Licensed Clinical Social Worker

## 2019-09-22 NOTE — Telephone Encounter (Signed)
[facility] Paramedic has been unable to get a hold of pt- reports multiple reach out attempts and no return call.  CSW called pt and was able to get through- pt reports his phone does not allow him to call out right now and provided CSW with new number for paramedic to reach out to him on.  He continues to think he would benefit from paramedicine and would like for them to follow up regarding home visit.  CSW provied number to paramedic who will reach out regarding visit.  [name], LCSW Clinical Social Worker [facility] Desk#: [phone] Cell#: [phone]

## 2019-09-29 ENCOUNTER — Telehealth (HOSPITAL_COMMUNITY): Payer: Self-pay

## 2019-09-29 NOTE — Telephone Encounter (Signed)
Left message for C. Russ to call me in regards to scheduling home visit. Will continue to follow.

## 2019-10-16 ENCOUNTER — Encounter (HOSPITAL_COMMUNITY): Payer: Self-pay

## 2019-10-16 ENCOUNTER — Other Ambulatory Visit: Payer: Self-pay

## 2019-10-16 ENCOUNTER — Ambulatory Visit (HOSPITAL_COMMUNITY)
Admission: RE | Admit: 2019-10-16 | Discharge: 2019-10-16 | Disposition: A | Discharge status: Home / Self Care | Payer: Medicaid Other | Source: Ambulatory Visit | Attending: Cardiology | Admitting: Cardiology

## 2019-10-16 ENCOUNTER — Telehealth (HOSPITAL_COMMUNITY): Payer: Self-pay

## 2019-10-16 VITALS — BP 166/122 | HR 92 | Wt 226.0 lb

## 2019-10-16 DIAGNOSIS — Z7982 Long term (current) use of aspirin: Secondary | ICD-10-CM | POA: Insufficient documentation

## 2019-10-16 DIAGNOSIS — I5042 Chronic combined systolic (congestive) and diastolic (congestive) heart failure: Secondary | ICD-10-CM

## 2019-10-16 DIAGNOSIS — F17210 Nicotine dependence, cigarettes, uncomplicated: Secondary | ICD-10-CM | POA: Diagnosis not present

## 2019-10-16 DIAGNOSIS — N183 Chronic kidney disease, stage 3 unspecified: Secondary | ICD-10-CM | POA: Diagnosis not present

## 2019-10-16 DIAGNOSIS — Z8249 Family history of ischemic heart disease and other diseases of the circulatory system: Secondary | ICD-10-CM | POA: Diagnosis not present

## 2019-10-16 DIAGNOSIS — Z9119 Patient's noncompliance with other medical treatment and regimen: Secondary | ICD-10-CM | POA: Insufficient documentation

## 2019-10-16 DIAGNOSIS — I255 Ischemic cardiomyopathy: Secondary | ICD-10-CM | POA: Insufficient documentation

## 2019-10-16 DIAGNOSIS — Z79899 Other long term (current) drug therapy: Secondary | ICD-10-CM | POA: Insufficient documentation

## 2019-10-16 DIAGNOSIS — I5022 Chronic systolic (congestive) heart failure: Secondary | ICD-10-CM | POA: Diagnosis present

## 2019-10-16 DIAGNOSIS — Z951 Presence of aortocoronary bypass graft: Secondary | ICD-10-CM | POA: Diagnosis not present

## 2019-10-16 DIAGNOSIS — I2510 Atherosclerotic heart disease of native coronary artery without angina pectoris: Secondary | ICD-10-CM | POA: Diagnosis not present

## 2019-10-16 DIAGNOSIS — I130 Hypertensive heart and chronic kidney disease with heart failure and stage 1 through stage 4 chronic kidney disease, or unspecified chronic kidney disease: Secondary | ICD-10-CM | POA: Diagnosis not present

## 2019-10-16 DIAGNOSIS — G4733 Obstructive sleep apnea (adult) (pediatric): Secondary | ICD-10-CM | POA: Diagnosis not present

## 2019-10-16 LAB — BASIC METABOLIC PANEL
Anion gap: 14 (ref 5–15)
BUN: 15 mg/dL (ref 6–20)
CO2: 27 mmol/L (ref 22–32)
Calcium: 9 mg/dL (ref 8.9–10.3)
Chloride: 97 mmol/L — ABNORMAL LOW (ref 98–111)
Creatinine, Ser: 2.29 mg/dL — ABNORMAL HIGH (ref 0.61–1.24)
GFR calc Af Amer: 37 mL/min — ABNORMAL LOW (ref >60)
GFR calc non Af Amer: 32 mL/min — ABNORMAL LOW (ref >60)
Glucose, Bld: 133 mg/dL — ABNORMAL HIGH (ref 70–99)
Potassium: 2.7 mmol/L — CL (ref 3.5–5.1)
Sodium: 138 mmol/L (ref 135–145)

## 2019-10-16 MED ORDER — AMLODIPINE BESYLATE 5 MG PO TABS: 5.0000 mg | ORAL_TABLET | Freq: Every day | ORAL | 11 refills | Status: DC

## 2019-10-16 NOTE — Telephone Encounter (Signed)
[place] lab called to report that the patients potassium level was critically low at 2.7. this information was dicussed with Scott Salinas and she stated that the patient needs to be seen in the ER ASAP. I then called the patient to inform him of his results and the directions given by Scott Salinas and he asked if he could just take the potassium that he has on hand to raise his K level. I advised him that he can not and that it is really important for him to be seen in the ER due to the possible SE that he could experience due to having a low K level. Patient stated that he had other things to do and asked how long he would be in the ER. I advised him that I was unsure how long he would be in the ER. Patient stated that he had to drop his car off today(6/10) at the shop at 3pm but stated he would report to the ER some time today.

## 2019-10-16 NOTE — Progress Notes (Signed)
CSW met with pt in the clinic to discuss continued involvement with [employer] program as patient's last visit with paramedic was April 1st and paramedic has not been able to set up an appt with pt since.  Pt reports he does not have a preference whether or not the paramedic continues to come out and does not feel it will affect his health one way or another so we will discharge the patient from the paramedicine program at this time.  We can re-enroll patient if he feels he needs help in the future.  [name], LCSW Clinical Social Worker [facility] Desk#: [phone] Cell#: [phone]

## 2019-10-16 NOTE — Patient Instructions (Signed)
START Norvasc 5 mg, one tab daily at bedtime  Labs today We will only contact you if something comes back abnormal or we need to make some changes. Otherwise no news is good news!  Your physician recommends that you schedule a follow-up appointment in: 3-4 weeks with Selinda Michaels D  Your physician recommends that you schedule a follow-up appointment in: 3 months with Dr [name]  Do the following things EVERYDAY: 1) Weigh yourself in the morning before breakfast. Write it down and keep it in a log. 2) Take your medicines as prescribed 3) Eat low salt foods--Limit salt (sodium) to 2000 mg per day.  4) Stay as active as you can everyday 5) Limit all fluids for the day to less than 2 liters  At the [facility], you and your health needs are our priority. As part of our continuing mission to provide you with exceptional heart care, we have created designated Provider Care Teams. These Care Teams include your primary Cardiologist (physician) and Advanced Practice Providers (APPs- Physician Assistants and Nurse Practitioners) who all work together to provide you with the care you need, when you need it.   You may see any of the following providers on your designated Care Team at your next follow up: Marland Kitchen Dr Glori Bickers . Dr Loralie Champagne . Darrick Grinder, NP . Lyda Jester, PA . Audry Riles, PharmD   Please be sure to bring in all your medications bottles to every appointment.
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
## 2019-10-23 ENCOUNTER — Encounter (HOSPITAL_COMMUNITY): Payer: Self-pay | Admitting: *Deleted

## 2019-10-23 ENCOUNTER — Emergency Department (HOSPITAL_COMMUNITY): Payer: Medicaid Other

## 2019-10-23 ENCOUNTER — Inpatient Hospital Stay (HOSPITAL_COMMUNITY)
Admission: EM | Admit: 2019-10-23 | Discharge: 2019-10-29 | DRG: 304 | Disposition: A | Discharge status: Home / Self Care | Payer: Medicaid Other | Attending: Internal Medicine | Admitting: Internal Medicine

## 2019-10-23 ENCOUNTER — Other Ambulatory Visit: Payer: Self-pay

## 2019-10-23 DIAGNOSIS — Z888 Allergy status to other drugs, medicaments and biological substances status: Secondary | ICD-10-CM

## 2019-10-23 DIAGNOSIS — I255 Ischemic cardiomyopathy: Secondary | ICD-10-CM | POA: Diagnosis present

## 2019-10-23 DIAGNOSIS — R112 Nausea with vomiting, unspecified: Secondary | ICD-10-CM | POA: Diagnosis not present

## 2019-10-23 DIAGNOSIS — G4733 Obstructive sleep apnea (adult) (pediatric): Secondary | ICD-10-CM | POA: Diagnosis present

## 2019-10-23 DIAGNOSIS — R079 Chest pain, unspecified: Secondary | ICD-10-CM | POA: Diagnosis not present

## 2019-10-23 DIAGNOSIS — Z6833 Body mass index (BMI) 33.0-33.9, adult: Secondary | ICD-10-CM

## 2019-10-23 DIAGNOSIS — Z955 Presence of coronary angioplasty implant and graft: Secondary | ICD-10-CM

## 2019-10-23 DIAGNOSIS — D649 Anemia, unspecified: Secondary | ICD-10-CM | POA: Diagnosis present

## 2019-10-23 DIAGNOSIS — I5023 Acute on chronic systolic (congestive) heart failure: Secondary | ICD-10-CM | POA: Diagnosis not present

## 2019-10-23 DIAGNOSIS — I5042 Chronic combined systolic (congestive) and diastolic (congestive) heart failure: Secondary | ICD-10-CM | POA: Diagnosis present

## 2019-10-23 DIAGNOSIS — T783XXA Angioneurotic edema, initial encounter: Secondary | ICD-10-CM | POA: Diagnosis not present

## 2019-10-23 DIAGNOSIS — M7989 Other specified soft tissue disorders: Secondary | ICD-10-CM | POA: Diagnosis present

## 2019-10-23 DIAGNOSIS — R0602 Shortness of breath: Secondary | ICD-10-CM

## 2019-10-23 DIAGNOSIS — E876 Hypokalemia: Secondary | ICD-10-CM | POA: Diagnosis present

## 2019-10-23 DIAGNOSIS — I2510 Atherosclerotic heart disease of native coronary artery without angina pectoris: Secondary | ICD-10-CM | POA: Diagnosis present

## 2019-10-23 DIAGNOSIS — M62838 Other muscle spasm: Secondary | ICD-10-CM | POA: Diagnosis present

## 2019-10-23 DIAGNOSIS — I169 Hypertensive crisis, unspecified: Secondary | ICD-10-CM | POA: Diagnosis present

## 2019-10-23 DIAGNOSIS — Z8249 Family history of ischemic heart disease and other diseases of the circulatory system: Secondary | ICD-10-CM

## 2019-10-23 DIAGNOSIS — Z20822 Contact with and (suspected) exposure to covid-19: Secondary | ICD-10-CM | POA: Diagnosis present

## 2019-10-23 DIAGNOSIS — I160 Hypertensive urgency: Principal | ICD-10-CM

## 2019-10-23 DIAGNOSIS — Z79899 Other long term (current) drug therapy: Secondary | ICD-10-CM

## 2019-10-23 DIAGNOSIS — I130 Hypertensive heart and chronic kidney disease with heart failure and stage 1 through stage 4 chronic kidney disease, or unspecified chronic kidney disease: Secondary | ICD-10-CM | POA: Diagnosis present

## 2019-10-23 DIAGNOSIS — R52 Pain, unspecified: Secondary | ICD-10-CM

## 2019-10-23 DIAGNOSIS — M19071 Primary osteoarthritis, right ankle and foot: Secondary | ICD-10-CM | POA: Diagnosis present

## 2019-10-23 DIAGNOSIS — F17200 Nicotine dependence, unspecified, uncomplicated: Secondary | ICD-10-CM | POA: Diagnosis present

## 2019-10-23 DIAGNOSIS — N179 Acute kidney failure, unspecified: Secondary | ICD-10-CM | POA: Diagnosis not present

## 2019-10-23 DIAGNOSIS — I252 Old myocardial infarction: Secondary | ICD-10-CM

## 2019-10-23 DIAGNOSIS — R9431 Abnormal electrocardiogram [ECG] [EKG]: Secondary | ICD-10-CM

## 2019-10-23 DIAGNOSIS — W3400XD Accidental discharge from unspecified firearms or gun, subsequent encounter: Secondary | ICD-10-CM

## 2019-10-23 DIAGNOSIS — E669 Obesity, unspecified: Secondary | ICD-10-CM | POA: Diagnosis present

## 2019-10-23 DIAGNOSIS — I712 Thoracic aortic aneurysm, without rupture: Secondary | ICD-10-CM | POA: Diagnosis present

## 2019-10-23 DIAGNOSIS — R001 Bradycardia, unspecified: Secondary | ICD-10-CM

## 2019-10-23 DIAGNOSIS — I25118 Atherosclerotic heart disease of native coronary artery with other forms of angina pectoris: Secondary | ICD-10-CM | POA: Diagnosis present

## 2019-10-23 DIAGNOSIS — M25519 Pain in unspecified shoulder: Secondary | ICD-10-CM | POA: Diagnosis present

## 2019-10-23 DIAGNOSIS — Z8679 Personal history of other diseases of the circulatory system: Secondary | ICD-10-CM

## 2019-10-23 DIAGNOSIS — I161 Hypertensive emergency: Secondary | ICD-10-CM | POA: Diagnosis present

## 2019-10-23 DIAGNOSIS — Z599 Problem related to housing and economic circumstances, unspecified: Secondary | ICD-10-CM

## 2019-10-23 DIAGNOSIS — Z7982 Long term (current) use of aspirin: Secondary | ICD-10-CM

## 2019-10-23 DIAGNOSIS — F419 Anxiety disorder, unspecified: Secondary | ICD-10-CM | POA: Diagnosis present

## 2019-10-23 DIAGNOSIS — F1290 Cannabis use, unspecified, uncomplicated: Secondary | ICD-10-CM | POA: Diagnosis present

## 2019-10-23 DIAGNOSIS — R519 Headache, unspecified: Secondary | ICD-10-CM | POA: Diagnosis present

## 2019-10-23 DIAGNOSIS — Z9114 Patient's other noncompliance with medication regimen: Secondary | ICD-10-CM

## 2019-10-23 DIAGNOSIS — N183 Chronic kidney disease, stage 3 unspecified: Secondary | ICD-10-CM | POA: Diagnosis present

## 2019-10-23 DIAGNOSIS — N1832 Chronic kidney disease, stage 3b: Secondary | ICD-10-CM | POA: Diagnosis present

## 2019-10-23 LAB — MAGNESIUM: Magnesium: 2 mg/dL (ref 1.7–2.4)

## 2019-10-23 LAB — CBC
HCT: 49 % (ref 39.0–52.0)
Hemoglobin: 16.1 g/dL (ref 13.0–17.0)
MCH: 30.9 pg (ref 26.0–34.0)
MCHC: 32.9 g/dL (ref 30.0–36.0)
MCV: 94 fL (ref 80.0–100.0)
Platelets: 232 10*3/uL (ref 150–400)
RBC: 5.21 MIL/uL (ref 4.22–5.81)
RDW: 15.1 % (ref 11.5–15.5)
WBC: 6.6 10*3/uL (ref 4.0–10.5)
nRBC: 0 % (ref 0.0–0.2)

## 2019-10-23 LAB — BASIC METABOLIC PANEL
Anion gap: 12 (ref 5–15)
BUN: 19 mg/dL (ref 6–20)
CO2: 25 mmol/L (ref 22–32)
Calcium: 9 mg/dL (ref 8.9–10.3)
Chloride: 99 mmol/L (ref 98–111)
Creatinine, Ser: 2.34 mg/dL — ABNORMAL HIGH (ref 0.61–1.24)
GFR calc Af Amer: 36 mL/min — ABNORMAL LOW (ref >60)
GFR calc non Af Amer: 31 mL/min — ABNORMAL LOW (ref >60)
Glucose, Bld: 103 mg/dL — ABNORMAL HIGH (ref 70–99)
Potassium: 3 mmol/L — ABNORMAL LOW (ref 3.5–5.1)
Sodium: 136 mmol/L (ref 135–145)

## 2019-10-23 LAB — SARS CORONAVIRUS 2 BY RT PCR (HOSPITAL ORDER, PERFORMED IN ~~LOC~~ HOSPITAL LAB): SARS Coronavirus 2: NEGATIVE

## 2019-10-23 LAB — TROPONIN I (HIGH SENSITIVITY)
Troponin I (High Sensitivity): 18 ng/L — ABNORMAL HIGH (ref <18)
Troponin I (High Sensitivity): 15 ng/L (ref <18)

## 2019-10-23 MED ORDER — NITROGLYCERIN IN D5W 200-5 MCG/ML-% IV SOLN
0.0000 ug/min | INTRAVENOUS | Status: DC
  Administered 2019-10-23: 5 ug/min via INTRAVENOUS
  Administered 2019-10-26: 20 ug/min via INTRAVENOUS
  Filled 2019-10-23 (×3): qty 250

## 2019-10-23 MED ORDER — SODIUM CHLORIDE 0.9% FLUSH
3.0000 mL | Freq: Two times a day (BID) | INTRAVENOUS | Status: DC
  Administered 2019-10-25: 3 mL via INTRAVENOUS

## 2019-10-23 MED ORDER — HYDRALAZINE HCL 50 MG PO TABS
100.0000 mg | ORAL_TABLET | Freq: Two times a day (BID) | ORAL | Status: DC
  Administered 2019-10-23 – 2019-10-24 (×2): 100 mg via ORAL
  Filled 2019-10-23 (×2): qty 2

## 2019-10-23 MED ORDER — ACETAMINOPHEN 650 MG RE SUPP: 650.0000 mg | Freq: Four times a day (QID) | RECTAL | Status: DC | PRN

## 2019-10-23 MED ORDER — ASPIRIN 81 MG PO CHEW
324.0000 mg | CHEWABLE_TABLET | Freq: Once | ORAL | Status: AC
  Administered 2019-10-23: 324 mg via ORAL
  Filled 2019-10-23: qty 4

## 2019-10-23 MED ORDER — SODIUM CHLORIDE 0.9% FLUSH
3.0000 mL | Freq: Two times a day (BID) | INTRAVENOUS | Status: DC
  Administered 2019-10-23 – 2019-10-29 (×5): 3 mL via INTRAVENOUS

## 2019-10-23 MED ORDER — SODIUM CHLORIDE 0.9% FLUSH: 3.0000 mL | INTRAVENOUS | Status: DC | PRN

## 2019-10-23 MED ORDER — CARVEDILOL 6.25 MG PO TABS
18.7500 mg | ORAL_TABLET | Freq: Two times a day (BID) | ORAL | Status: DC
  Administered 2019-10-23 – 2019-10-24 (×3): 18.75 mg via ORAL
  Filled 2019-10-23 (×2): qty 1
  Filled 2019-10-23: qty 2

## 2019-10-23 MED ORDER — NITROGLYCERIN 0.4 MG SL SUBL: 0.4000 mg | SUBLINGUAL_TABLET | SUBLINGUAL | Status: DC | PRN

## 2019-10-23 MED ORDER — POTASSIUM CHLORIDE 10 MEQ/100ML IV SOLN
10.0000 meq | INTRAVENOUS | Status: AC
  Administered 2019-10-23 (×2): 10 meq via INTRAVENOUS
  Filled 2019-10-23 (×2): qty 100

## 2019-10-23 MED ORDER — SODIUM CHLORIDE 0.9 % IV SOLN: 250.0000 mL | INTRAVENOUS | Status: DC | PRN

## 2019-10-23 MED ORDER — SODIUM CHLORIDE 0.9% FLUSH
3.0000 mL | Freq: Once | INTRAVENOUS | Status: AC
  Administered 2019-10-23: 3 mL via INTRAVENOUS

## 2019-10-23 MED ORDER — NITROPRUSSIDE SODIUM-NACL 20-0.9 MG/100ML-% IV SOLN
0.0000 ug/kg/min | INTRAVENOUS | Status: DC
  Filled 2019-10-23: qty 100

## 2019-10-23 MED ORDER — POTASSIUM CHLORIDE CRYS ER 20 MEQ PO TBCR
40.0000 meq | EXTENDED_RELEASE_TABLET | Freq: Once | ORAL | Status: AC
  Administered 2019-10-23: 40 meq via ORAL
  Filled 2019-10-23: qty 2

## 2019-10-23 MED ORDER — TORSEMIDE 10 MG PO TABS
30.0000 mg | ORAL_TABLET | Freq: Once | ORAL | Status: AC
  Administered 2019-10-23: 30 mg via ORAL
  Filled 2019-10-23: qty 3

## 2019-10-23 MED ORDER — HYDRALAZINE HCL 25 MG PO TABS
100.0000 mg | ORAL_TABLET | Freq: Once | ORAL | Status: AC
  Administered 2019-10-23: 100 mg via ORAL
  Filled 2019-10-23: qty 4

## 2019-10-23 MED ORDER — HYDROCODONE-ACETAMINOPHEN 5-325 MG PO TABS
1.0000 | ORAL_TABLET | ORAL | Status: DC | PRN
  Administered 2019-10-23 – 2019-10-29 (×16): 2 via ORAL
  Filled 2019-10-23 (×16): qty 2

## 2019-10-23 MED ORDER — TORSEMIDE 20 MG PO TABS
60.0000 mg | ORAL_TABLET | Freq: Every day | ORAL | Status: DC
  Administered 2019-10-23 – 2019-10-24 (×2): 60 mg via ORAL
  Filled 2019-10-23 (×2): qty 3

## 2019-10-23 MED ORDER — HEPARIN SODIUM (PORCINE) 5000 UNIT/ML IJ SOLN
5000.0000 [IU] | Freq: Three times a day (TID) | INTRAMUSCULAR | Status: DC
  Administered 2019-10-23 – 2019-10-29 (×19): 5000 [IU] via SUBCUTANEOUS
  Filled 2019-10-23 (×20): qty 1

## 2019-10-23 MED ORDER — ACETAMINOPHEN 325 MG PO TABS: 650.0000 mg | ORAL_TABLET | Freq: Four times a day (QID) | ORAL | Status: DC | PRN

## 2019-10-23 MED ORDER — SENNOSIDES-DOCUSATE SODIUM 8.6-50 MG PO TABS
1.0000 | ORAL_TABLET | Freq: Every evening | ORAL | Status: DC | PRN
  Administered 2019-10-28: 1 via ORAL
  Filled 2019-10-23: qty 1

## 2019-10-23 MED ORDER — ASPIRIN EC 81 MG PO TBEC
81.0000 mg | DELAYED_RELEASE_TABLET | Freq: Every day | ORAL | Status: DC
  Administered 2019-10-23 – 2019-10-29 (×7): 81 mg via ORAL
  Filled 2019-10-23 (×7): qty 1
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
## 2019-10-23 NOTE — ED Triage Notes (Signed)
The pt is c/o chest pain since yesterday  Sob for one week
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
## 2019-10-24 ENCOUNTER — Observation Stay (HOSPITAL_COMMUNITY): Payer: Medicaid Other

## 2019-10-24 DIAGNOSIS — I169 Hypertensive crisis, unspecified: Secondary | ICD-10-CM | POA: Diagnosis not present

## 2019-10-24 LAB — BASIC METABOLIC PANEL
Anion gap: 11 (ref 5–15)
BUN: 22 mg/dL — ABNORMAL HIGH (ref 6–20)
CO2: 24 mmol/L (ref 22–32)
Calcium: 9.2 mg/dL (ref 8.9–10.3)
Chloride: 101 mmol/L (ref 98–111)
Creatinine, Ser: 2.31 mg/dL — ABNORMAL HIGH (ref 0.61–1.24)
GFR calc Af Amer: 37 mL/min — ABNORMAL LOW (ref >60)
GFR calc non Af Amer: 32 mL/min — ABNORMAL LOW (ref >60)
Glucose, Bld: 121 mg/dL — ABNORMAL HIGH (ref 70–99)
Potassium: 3.4 mmol/L — ABNORMAL LOW (ref 3.5–5.1)
Sodium: 136 mmol/L (ref 135–145)

## 2019-10-24 LAB — CBC
HCT: 45.7 % (ref 39.0–52.0)
Hemoglobin: 15.5 g/dL (ref 13.0–17.0)
MCH: 31.1 pg (ref 26.0–34.0)
MCHC: 33.9 g/dL (ref 30.0–36.0)
MCV: 91.6 fL (ref 80.0–100.0)
Platelets: 223 10*3/uL (ref 150–400)
RBC: 4.99 MIL/uL (ref 4.22–5.81)
RDW: 15.1 % (ref 11.5–15.5)
WBC: 6 10*3/uL (ref 4.0–10.5)
nRBC: 0 % (ref 0.0–0.2)

## 2019-10-24 LAB — HIV ANTIBODY (ROUTINE TESTING W REFLEX): HIV Screen 4th Generation wRfx: NONREACTIVE

## 2019-10-24 LAB — MAGNESIUM: Magnesium: 2.1 mg/dL (ref 1.7–2.4)

## 2019-10-24 MED ORDER — MORPHINE SULFATE (PF) 2 MG/ML IV SOLN
2.0000 mg | Freq: Once | INTRAVENOUS | Status: AC
  Administered 2019-10-24: 2 mg via INTRAVENOUS

## 2019-10-24 MED ORDER — NICOTINE 14 MG/24HR TD PT24
14.0000 mg | MEDICATED_PATCH | Freq: Every day | TRANSDERMAL | Status: DC
  Administered 2019-10-24 – 2019-10-27 (×4): 14 mg via TRANSDERMAL
  Filled 2019-10-24 (×5): qty 1

## 2019-10-24 MED ORDER — MORPHINE SULFATE (PF) 2 MG/ML IV SOLN
2.0000 mg | INTRAVENOUS | Status: DC | PRN
  Filled 2019-10-24: qty 1

## 2019-10-24 MED ORDER — METOPROLOL TARTRATE 5 MG/5ML IV SOLN: 10.0000 mg | Freq: Four times a day (QID) | INTRAVENOUS | Status: DC | PRN

## 2019-10-24 MED ORDER — HYDRALAZINE HCL 50 MG PO TABS
100.0000 mg | ORAL_TABLET | Freq: Four times a day (QID) | ORAL | Status: DC
  Administered 2019-10-24 – 2019-10-28 (×15): 100 mg via ORAL
  Filled 2019-10-24 (×15): qty 2

## 2019-10-24 MED ORDER — METOPROLOL TARTRATE 5 MG/5ML IV SOLN
5.0000 mg | Freq: Four times a day (QID) | INTRAVENOUS | Status: DC | PRN
  Administered 2019-10-24: 5 mg via INTRAVENOUS
  Filled 2019-10-24: qty 5

## 2019-10-24 MED ORDER — FUROSEMIDE 10 MG/ML IJ SOLN
60.0000 mg | Freq: Two times a day (BID) | INTRAMUSCULAR | Status: DC
  Administered 2019-10-24 – 2019-10-25 (×3): 60 mg via INTRAVENOUS
  Filled 2019-10-24 (×3): qty 6

## 2019-10-24 MED ORDER — SCOPOLAMINE 1 MG/3DAYS TD PT72
1.0000 | MEDICATED_PATCH | TRANSDERMAL | Status: DC
  Filled 2019-10-24 (×2): qty 1

## 2019-10-24 MED ORDER — PROMETHAZINE HCL 25 MG/ML IJ SOLN
12.5000 mg | Freq: Four times a day (QID) | INTRAMUSCULAR | Status: DC | PRN
  Administered 2019-10-24: 12.5 mg via INTRAVENOUS
  Filled 2019-10-24: qty 1

## 2019-10-24 MED ORDER — POTASSIUM CHLORIDE CRYS ER 20 MEQ PO TBCR
20.0000 meq | EXTENDED_RELEASE_TABLET | Freq: Two times a day (BID) | ORAL | Status: DC
  Administered 2019-10-24 – 2019-10-29 (×10): 20 meq via ORAL
  Filled 2019-10-24 (×10): qty 1

## 2019-10-24 NOTE — Progress Notes (Signed)
Patient's HR dropped to 40's, lowest HR 39. HR ranging 60-80's now. BP 148/103. Patient asymptomatic.  MD paged. Hold coreg and obtain EKG
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
## 2019-10-24 NOTE — Progress Notes (Signed)
PROGRESS NOTE    Scott Salinas  PIR:[id number] DOB: 07-14-1970 DOA: 10/23/2019 PCP: Patient, No Pcp Per    Brief Narrative: hpi per dr opyd-Jemal Searls is a 49 y.o. male with medical history significant for CAD status post CABG, chronic systolic CHF with EF 30 to 35%, accelerated hypertension, and now presenting to the ED for evaluation of chest discomfort and shortness of breath.  Patient reports shortness of breath and chest discomfort over the past couple days without cough, fevers, or chills.  The chest discomfort began at rest, seems worse with certain movements or laying on his left side, has been fairly constant, and associated with shortness of breath but no nausea or diaphoresis.  He had some right foot swelling, pain, and redness a few days ago but that seems to have resolved.  He reports adherence with his medications at home, reports that he tries to maintain a low-salt diet, but acknowledges consuming more fluids than recommended.  He reports weighing himself daily with fluctuations between 210 and 225 pounds; states that he was closer to 225 last week but now down closer to 210.   ED Course: Upon arrival to the ED, patient is found to be afebrile, saturating well on room air, normal RR and HR, and BP as high as 192/145. EKG features sinus rhythm with non-specific ST-T abnormalities that are similar to prior and QTc interval of 510 ms. CXR with cardiomegaly but no acute findings. Chemistry panel notable for potassium 3.0 and SCr 2.34. CBC unremarkable. HS troponin was 18, then 15. Patient was treated with oral hydralazine in ED and then started on nitroglycerin infusion. Hospitalists were asked to admit.   Assessment & Plan:   Principal Problem:   Hypertensive crisis Active Problems:   CKD (chronic kidney disease) stage 3, GFR 30-59 ml/min   Prolonged QT interval   Chronic combined systolic and diastolic CHF (congestive heart failure) (HCC)   CAD (coronary artery disease)    Chest pain   Hypokalemia   #1 hypertensive urgency-likely secondary to dietary noncompliance.  Patient reports he was compliant with his medical regime.  He was started on nitro drip still being continued on the drip.  Blood pressure improved  161/102 since admission.  Pressure 192/145 on arrival to ER. Increase hydralazine to 100 mg 4 times a day. Continue Coreg Continue torsemide Titrate nitro drip to DC as tolerated  #2 history of CAD/CABG/chronic systolic heart failure ejection fraction 30 to 35% February 2021.  Chest x-ray clear. His weight is at baseline. Continue torsemide 60 mg daily. Continue aspirin and Coreg. He is intolerant to statin due to muscle spasm and myopathy.  #3 hypokalemia potassium 3.4 repleted normal magnesium  #4 CKD stage IIIb appears to be at baseline, creatinine 2.3.  Monitor closely.  Dose medications renally adjusted.  #5 QT prolongation monitor closely replete lites avoid QT prolonging agents  Estimated body mass index is 32.41 kg/m as calculated from the following:   Height as of this encounter: 5\' 10"  (1.778 m).   Weight as of this encounter: 102.5 kg.  DVT prophylaxis: heparin Code Status: full Family Communication: dw patient Disposition Plan:  Status is: Observation  Dispo: The patient is from: home              Anticipated d/c is to: home              Anticipated d/c date is: 6/19              Patient currently  is not medically stable to d/c.   Consultants: none  Procedures: none Antimicrobials: none Subjective:  Patient was sitting up in chair when I walked into the room few minutes later he started throwing up he thinks it is from getting morphine for headache he denies any chest pain or shortness of breath  Objective: Vitals:   10/24/19 0457 10/24/19 0845 10/24/19 1006 10/24/19 1035  BP: (!) 183/170 (!) 158/116 137/79 (!) 161/102  Pulse: 89 91 65 79  Resp: 20  (!) 21 (!) 23  Temp: (!) 97.5 F (36.4 C)     TempSrc:        SpO2: 95%  96% 98%  Weight:      Height:        Intake/Output Summary (Last 24 hours) at 10/24/2019 1203 Last data filed at 10/24/2019 1010 Gross per 24 hour  Intake --  Output 600 ml  Net -600 ml   Filed Weights   10/23/19 0043 10/23/19 0045 10/23/19 1429  Weight: 99.8 kg 99.8 kg 102.5 kg    Examination:  General exam: Appears mild distress Respiratory system: Clear to auscultation. Respiratory effort normal. Cardiovascular system: S1 & S2 heard, RRR. No JVD, murmurs, rubs, gallops or clicks. No pedal edema. Gastrointestinal system: Abdomen is nondistended, soft and nontender. No organomegaly or masses felt. Normal bowel sounds heard. Central nervous system: Alert and oriented. No focal neurological deficits. Extremities: Symmetric 5 x 5 power. Skin: No rashes, lesions or ulcers Psychiatry: Judgement and insight appear normal. Mood & affect appropriate.     Data Reviewed: I have personally reviewed following labs and imaging studies  CBC: Recent Labs  Lab 10/23/19 0050 10/24/19 0434  WBC 6.6 6.0  HGB 16.1 15.5  HCT 49.0 45.7  MCV 94.0 91.6  PLT 232 656   Basic Metabolic Panel: Recent Labs  Lab 10/23/19 0050 10/23/19 0259 10/24/19 0434  NA 136  --  136  K 3.0*  --  3.4*  CL 99  --  101  CO2 25  --  24  GLUCOSE 103*  --  121*  BUN 19  --  22*  CREATININE 2.34*  --  2.31*  CALCIUM 9.0  --  9.2  MG  --  2.0 2.1   GFR: Estimated Creatinine Clearance: 46.4 mL/min (A) (by C-G formula based on SCr of 2.31 mg/dL (H)). Liver Function Tests: No results for input(s): AST, ALT, ALKPHOS, BILITOT, PROT, ALBUMIN in the last 168 hours. No results for input(s): LIPASE, AMYLASE in the last 168 hours. No results for input(s): AMMONIA in the last 168 hours. Coagulation Profile: No results for input(s): INR, PROTIME in the last 168 hours. Cardiac Enzymes: No results for input(s): CKTOTAL, CKMB, CKMBINDEX, TROPONINI in the last 168 hours. BNP (last 3 results) No  results for input(s): PROBNP in the last 8760 hours. HbA1C: No results for input(s): HGBA1C in the last 72 hours. CBG: No results for input(s): GLUCAP in the last 168 hours. Lipid Profile: No results for input(s): CHOL, HDL, LDLCALC, TRIG, CHOLHDL, LDLDIRECT in the last 72 hours. Thyroid Function Tests: No results for input(s): TSH, T4TOTAL, FREET4, T3FREE, THYROIDAB in the last 72 hours. Anemia Panel: No results for input(s): VITAMINB12, FOLATE, FERRITIN, TIBC, IRON, RETICCTPCT in the last 72 hours. Sepsis Labs: No results for input(s): PROCALCITON, LATICACIDVEN in the last 168 hours.  Recent Results (from the past 240 hour(s))  SARS Coronavirus 2 by RT PCR (hospital order, performed in [facility] hospital lab) Nasopharyngeal Nasopharyngeal Swab  Status: None   Collection Time: 10/23/19  4:16 AM   Specimen: Nasopharyngeal Swab  Result Value Ref Range Status   SARS Coronavirus 2 NEGATIVE NEGATIVE Final    Comment: (NOTE) SARS-CoV-2 target nucleic acids are NOT DETECTED.  The SARS-CoV-2 RNA is generally detectable in upper and lower respiratory specimens during the acute phase of infection. The lowest concentration of SARS-CoV-2 viral copies this assay can detect is 250 copies / mL. A negative result does not preclude SARS-CoV-2 infection and should not be used as the sole basis for treatment or other patient management decisions.  A negative result may occur with improper specimen collection / handling, submission of specimen other than nasopharyngeal swab, presence of viral mutation(s) within the areas targeted by this assay, and inadequate number of viral copies (<250 copies / mL). A negative result must be combined with clinical observations, patient history, and epidemiological information.  Fact Sheet for Patients:   StrictlyIdeas.no  Fact Sheet for Healthcare Providers: BankingDealers.co.za  This test is not yet approved  or  cleared by the [place] FDA and has been authorized for detection and/or diagnosis of SARS-CoV-2 by FDA under an Emergency Use Authorization (EUA).  This EUA will remain in effect (meaning this test can be used) for the duration of the COVID-19 declaration under Section 564(b)(1) of the Act, 21 U.S.C. section 360bbb-3(b)(1), unless the authorization is terminated or revoked sooner.  Performed at [facility], [address], [place], [place] [address]          Radiology Studies: DG Chest 2 View  Result Date: 10/23/2019 CLINICAL DATA:  Chest pain EXAM: CHEST - 2 VIEW COMPARISON:  02/08/2018 FINDINGS: Prior CABG. Cardiomegaly. Lungs are clear. No effusions. No acute bony abnormality. IMPRESSION: Cardiomegaly.  No active disease. Electronically Signed   By: Rolm Baptise M.D.   On: 10/23/2019 01:29        Scheduled Meds: . aspirin EC  81 mg Oral Daily  . carvedilol  18.75 mg Oral BID WC  . heparin  5,000 Units Subcutaneous Q8H  . hydrALAZINE  100 mg Oral Q12H  . scopolamine  1 patch Transdermal Q72H  . sodium chloride flush  3 mL Intravenous Q12H  . sodium chloride flush  3 mL Intravenous Q12H  . torsemide  60 mg Oral Daily   Continuous Infusions: . sodium chloride    . nitroGLYCERIN 30 mcg/min (10/24/19 1016)     LOS: 0 days      Georgette Shell, MD  10/24/2019, 12:03 PM

## 2019-10-25 ENCOUNTER — Inpatient Hospital Stay (HOSPITAL_COMMUNITY): Payer: Medicaid Other

## 2019-10-25 ENCOUNTER — Observation Stay (HOSPITAL_BASED_OUTPATIENT_CLINIC_OR_DEPARTMENT_OTHER): Payer: Medicaid Other

## 2019-10-25 DIAGNOSIS — W3400XD Accidental discharge from unspecified firearms or gun, subsequent encounter: Secondary | ICD-10-CM | POA: Diagnosis not present

## 2019-10-25 DIAGNOSIS — I5042 Chronic combined systolic (congestive) and diastolic (congestive) heart failure: Secondary | ICD-10-CM | POA: Diagnosis not present

## 2019-10-25 DIAGNOSIS — M7989 Other specified soft tissue disorders: Secondary | ICD-10-CM | POA: Diagnosis present

## 2019-10-25 DIAGNOSIS — Z20822 Contact with and (suspected) exposure to covid-19: Secondary | ICD-10-CM | POA: Diagnosis present

## 2019-10-25 DIAGNOSIS — I5043 Acute on chronic combined systolic (congestive) and diastolic (congestive) heart failure: Secondary | ICD-10-CM | POA: Diagnosis not present

## 2019-10-25 DIAGNOSIS — I161 Hypertensive emergency: Secondary | ICD-10-CM | POA: Diagnosis not present

## 2019-10-25 DIAGNOSIS — I2510 Atherosclerotic heart disease of native coronary artery without angina pectoris: Secondary | ICD-10-CM | POA: Diagnosis not present

## 2019-10-25 DIAGNOSIS — I25118 Atherosclerotic heart disease of native coronary artery with other forms of angina pectoris: Secondary | ICD-10-CM | POA: Diagnosis present

## 2019-10-25 DIAGNOSIS — R609 Edema, unspecified: Secondary | ICD-10-CM

## 2019-10-25 DIAGNOSIS — I130 Hypertensive heart and chronic kidney disease with heart failure and stage 1 through stage 4 chronic kidney disease, or unspecified chronic kidney disease: Secondary | ICD-10-CM | POA: Diagnosis present

## 2019-10-25 DIAGNOSIS — Z8249 Family history of ischemic heart disease and other diseases of the circulatory system: Secondary | ICD-10-CM | POA: Diagnosis not present

## 2019-10-25 DIAGNOSIS — I5023 Acute on chronic systolic (congestive) heart failure: Secondary | ICD-10-CM | POA: Diagnosis not present

## 2019-10-25 DIAGNOSIS — F419 Anxiety disorder, unspecified: Secondary | ICD-10-CM | POA: Diagnosis present

## 2019-10-25 DIAGNOSIS — F1290 Cannabis use, unspecified, uncomplicated: Secondary | ICD-10-CM | POA: Diagnosis present

## 2019-10-25 DIAGNOSIS — R079 Chest pain, unspecified: Secondary | ICD-10-CM | POA: Diagnosis not present

## 2019-10-25 DIAGNOSIS — E876 Hypokalemia: Secondary | ICD-10-CM | POA: Diagnosis present

## 2019-10-25 DIAGNOSIS — I255 Ischemic cardiomyopathy: Secondary | ICD-10-CM | POA: Diagnosis present

## 2019-10-25 DIAGNOSIS — Z6833 Body mass index (BMI) 33.0-33.9, adult: Secondary | ICD-10-CM | POA: Diagnosis not present

## 2019-10-25 DIAGNOSIS — N179 Acute kidney failure, unspecified: Secondary | ICD-10-CM | POA: Diagnosis not present

## 2019-10-25 DIAGNOSIS — Z955 Presence of coronary angioplasty implant and graft: Secondary | ICD-10-CM | POA: Diagnosis not present

## 2019-10-25 DIAGNOSIS — Z888 Allergy status to other drugs, medicaments and biological substances status: Secondary | ICD-10-CM | POA: Diagnosis not present

## 2019-10-25 DIAGNOSIS — Z9114 Patient's other noncompliance with medication regimen: Secondary | ICD-10-CM | POA: Diagnosis not present

## 2019-10-25 DIAGNOSIS — M25519 Pain in unspecified shoulder: Secondary | ICD-10-CM | POA: Diagnosis present

## 2019-10-25 DIAGNOSIS — I169 Hypertensive crisis, unspecified: Secondary | ICD-10-CM | POA: Diagnosis present

## 2019-10-25 DIAGNOSIS — D649 Anemia, unspecified: Secondary | ICD-10-CM | POA: Diagnosis present

## 2019-10-25 DIAGNOSIS — F17200 Nicotine dependence, unspecified, uncomplicated: Secondary | ICD-10-CM | POA: Diagnosis present

## 2019-10-25 DIAGNOSIS — E669 Obesity, unspecified: Secondary | ICD-10-CM | POA: Diagnosis present

## 2019-10-25 DIAGNOSIS — I252 Old myocardial infarction: Secondary | ICD-10-CM | POA: Diagnosis not present

## 2019-10-25 DIAGNOSIS — R001 Bradycardia, unspecified: Secondary | ICD-10-CM | POA: Diagnosis present

## 2019-10-25 DIAGNOSIS — I5021 Acute systolic (congestive) heart failure: Secondary | ICD-10-CM | POA: Diagnosis not present

## 2019-10-25 DIAGNOSIS — N1832 Chronic kidney disease, stage 3b: Secondary | ICD-10-CM | POA: Diagnosis present

## 2019-10-25 DIAGNOSIS — I160 Hypertensive urgency: Secondary | ICD-10-CM | POA: Diagnosis present

## 2019-10-25 LAB — BASIC METABOLIC PANEL
Anion gap: 11 (ref 5–15)
BUN: 28 mg/dL — ABNORMAL HIGH (ref 6–20)
CO2: 24 mmol/L (ref 22–32)
Calcium: 8.8 mg/dL — ABNORMAL LOW (ref 8.9–10.3)
Chloride: 102 mmol/L (ref 98–111)
Creatinine, Ser: 2.63 mg/dL — ABNORMAL HIGH (ref 0.61–1.24)
GFR calc Af Amer: 32 mL/min — ABNORMAL LOW (ref >60)
GFR calc non Af Amer: 27 mL/min — ABNORMAL LOW (ref >60)
Glucose, Bld: 92 mg/dL (ref 70–99)
Potassium: 3.3 mmol/L — ABNORMAL LOW (ref 3.5–5.1)
Sodium: 137 mmol/L (ref 135–145)

## 2019-10-25 LAB — TROPONIN I (HIGH SENSITIVITY)
Troponin I (High Sensitivity): 12 ng/L (ref <18)
Troponin I (High Sensitivity): 14 ng/L (ref <18)

## 2019-10-25 LAB — CBC
HCT: 45.1 % (ref 39.0–52.0)
Hemoglobin: 15.5 g/dL (ref 13.0–17.0)
MCH: 31.4 pg (ref 26.0–34.0)
MCHC: 34.4 g/dL (ref 30.0–36.0)
MCV: 91.3 fL (ref 80.0–100.0)
Platelets: 231 10*3/uL (ref 150–400)
RBC: 4.94 MIL/uL (ref 4.22–5.81)
RDW: 15.1 % (ref 11.5–15.5)
WBC: 6.9 10*3/uL (ref 4.0–10.5)
nRBC: 0 % (ref 0.0–0.2)

## 2019-10-25 LAB — D-DIMER, QUANTITATIVE: D-Dimer, Quant: 0.54 ug/mL-FEU — ABNORMAL HIGH (ref 0.00–0.50)

## 2019-10-25 MED ORDER — CLONIDINE HCL 0.2 MG PO TABS
0.2000 mg | ORAL_TABLET | Freq: Two times a day (BID) | ORAL | Status: DC
  Administered 2019-10-25 – 2019-10-29 (×8): 0.2 mg via ORAL
  Filled 2019-10-25 (×8): qty 1

## 2019-10-25 MED ORDER — HYDROMORPHONE HCL 1 MG/ML IJ SOLN: 0.5000 mg | Freq: Once | INTRAMUSCULAR | Status: DC

## 2019-10-25 MED ORDER — HYDROMORPHONE HCL 1 MG/ML IJ SOLN
0.5000 mg | INTRAMUSCULAR | Status: DC | PRN
  Administered 2019-10-25 – 2019-10-28 (×6): 0.5 mg via INTRAVENOUS
  Filled 2019-10-25 (×6): qty 1

## 2019-10-25 MED ORDER — AMLODIPINE BESYLATE 10 MG PO TABS
10.0000 mg | ORAL_TABLET | Freq: Every day | ORAL | Status: DC
  Administered 2019-10-25 – 2019-10-29 (×5): 10 mg via ORAL
  Filled 2019-10-25 (×5): qty 1

## 2019-10-25 MED ORDER — LORAZEPAM 2 MG/ML IJ SOLN
0.5000 mg | Freq: Once | INTRAMUSCULAR | Status: AC
  Administered 2019-10-25: 0.5 mg via INTRAVENOUS
  Filled 2019-10-25: qty 1

## 2019-10-25 NOTE — Progress Notes (Signed)
PROGRESS NOTE    Scott Salinas  WUJ:[id number] DOB: 11/27/1970 DOA: 10/23/2019 PCP: Patient, No Pcp Per    Brief Narrative:Scott Brownis a 49 y.o.malewith medical history significant forCAD status post CABG, chronic systolic CHF with EF 30 to 35%, accelerated hypertension, and now presenting to the ED for evaluation of chest discomfort and shortness of breath. Patient reports shortness of breath and chest discomfort over the past couple days without cough, fevers, or chills. The chest discomfort began at rest, seems worse with certain movements or laying on his left side, has been fairly constant, and associated with shortness of breath but no nausea or diaphoresis. He had some right foot swelling, pain, and redness a few days ago but that seems to have resolved. He reports adherence with his medications at home, reports that he tries to maintain a low-salt diet, but acknowledges consuming more fluids than recommended.He reports weighing himself daily with fluctuations between 210 and 225 pounds;states that he was closer to 225 last week butnow down closer to 210.  ED Course:Upon arrival to the ED, patient is found to be afebrile, saturating well on room air, normal RR and HR, and BP as high as 192/145. EKG features sinus rhythm with non-specific ST-T abnormalities that are similar to prior and QTc interval of 510 ms. CXR with cardiomegaly but no acute findings. Chemistry panel notable for potassium 3.0 and SCr 2.34. CBC unremarkable. HS troponin was 18, then 15. Patient was treated with oral hydralazine in ED and then started on nitroglycerin infusion. Hospitalists were asked to admit.   Assessment & Plan:   Principal Problem:   Hypertensive crisis Active Problems:   CKD (chronic kidney disease) stage 3, GFR 30-59 ml/min   Prolonged QT interval   Chronic combined systolic and diastolic CHF (congestive heart failure) (HCC)   CAD (coronary artery disease)   Chest pain    Hypokalemia   Hypertensive emergency   #1 hypertensive urgency-likely secondary to dietary noncompliance.  Patient reports he was compliant with his medical regime.  He was started on nitro drip still being continued on the drip.  Blood pressure improved  161/102 since admission.  Pressure 192/145 on arrival to ER. Increase hydralazine to 100 mg 4 times a day.  Continue Lasix hydralazine.  Add Norvasc 10 mg daily.  He was on Coreg which was stopped yesterday as his heart rate was dipping down to the 40s.  #2 history of CAD/CABG/chronic systolic heart failure ejection fraction 30 to 35% February 2021.  Chest x-ray clear. His weight is at baseline. Continue Lasix 60 mg twice a day. Continue aspirin and Coreg. He is intolerant to statin due to muscle spasm and myopathy.  #3 hypokalemia potassium 3.4 repleted normal magnesium  #4 CKD stage IIIb appears to be at baseline, creatinine 2.3.  Monitor closely.  Dose medications renally adjusted.  #5 QT prolongation monitor closely replete lites avoid QT prolonging agents   Estimated body mass index is 32.08 kg/m as calculated from the following:   Height as of this encounter: 5\' 10"  (1.778 m).   Weight as of this encounter: 101.4 kg.   DVT prophylaxis: heparin Code Status: full Family Communication: dw patient Disposition Plan:  Status is: Observation  Dispo: The patient is from: home  Anticipated d/c is to: home  Anticipated d/c date is: 6/19  Patient currently is not medically stable to d/c.   Consultants: none  Procedures: none Antimicrobials:none  Subjective: Patient resting in bed feels better than yesterday however remains on nitro  drip blood pressure still elevated 161/102 on the nitro drip.  He denies any chest pain or headache.  Continues to have right foot pain.  X-rays showed arthritis.  Objective: Vitals:   10/25/19 1141 10/25/19 1145 10/25/19 1305 10/25/19 1325  BP: (!)  185/139 (!) 185/139 (!) 164/125 (!) 179/120  Pulse:   80   Resp:  17 18 (!) 21  Temp:   97.8 F (36.6 C)   TempSrc:   Oral   SpO2:   97%   Weight:      Height:        Intake/Output Summary (Last 24 hours) at 10/25/2019 1500 Last data filed at 10/25/2019 1307 Gross per 24 hour  Intake 714.81 ml  Output 2350 ml  Net -1635.19 ml   Filed Weights   10/23/19 0045 10/23/19 1429 10/25/19 0606  Weight: 99.8 kg 102.5 kg 101.4 kg    Examination:  General exam: Appears calm and comfortable  Respiratory system: Clear to auscultation. Respiratory effort normal. Cardiovascular system: S1 & S2 heard, RRR. No JVD, murmurs, rubs, gallops or clicks. No pedal edema. Gastrointestinal system: Abdomen is nondistended, soft and nontender. No organomegaly or masses felt. Normal bowel sounds heard. Central nervous system: Alert and oriented. No focal neurological deficits. Extremities: Symmetric 5 x 5 power. Skin: No rashes, lesions or ulcers Psychiatry: Judgement and insight appear normal. Mood & affect appropriate.     Data Reviewed: I have personally reviewed following labs and imaging studies  CBC: Recent Labs  Lab 10/23/19 0050 10/24/19 0434 10/25/19 0422  WBC 6.6 6.0 6.9  HGB 16.1 15.5 15.5  HCT 49.0 45.7 45.1  MCV 94.0 91.6 91.3  PLT 232 223 284   Basic Metabolic Panel: Recent Labs  Lab 10/23/19 0050 10/23/19 0259 10/24/19 0434 10/25/19 0422  NA 136  --  136 137  K 3.0*  --  3.4* 3.3*  CL 99  --  101 102  CO2 25  --  24 24  GLUCOSE 103*  --  121* 92  BUN 19  --  22* 28*  CREATININE 2.34*  --  2.31* 2.63*  CALCIUM 9.0  --  9.2 8.8*  MG  --  2.0 2.1  --    GFR: Estimated Creatinine Clearance: 40.6 mL/min (A) (by C-G formula based on SCr of 2.63 mg/dL (H)). Liver Function Tests: No results for input(s): AST, ALT, ALKPHOS, BILITOT, PROT, ALBUMIN in the last 168 hours. No results for input(s): LIPASE, AMYLASE in the last 168 hours. No results for input(s): AMMONIA in the  last 168 hours. Coagulation Profile: No results for input(s): INR, PROTIME in the last 168 hours. Cardiac Enzymes: No results for input(s): CKTOTAL, CKMB, CKMBINDEX, TROPONINI in the last 168 hours. BNP (last 3 results) No results for input(s): PROBNP in the last 8760 hours. HbA1C: No results for input(s): HGBA1C in the last 72 hours. CBG: No results for input(s): GLUCAP in the last 168 hours. Lipid Profile: No results for input(s): CHOL, HDL, LDLCALC, TRIG, CHOLHDL, LDLDIRECT in the last 72 hours. Thyroid Function Tests: No results for input(s): TSH, T4TOTAL, FREET4, T3FREE, THYROIDAB in the last 72 hours. Anemia Panel: No results for input(s): VITAMINB12, FOLATE, FERRITIN, TIBC, IRON, RETICCTPCT in the last 72 hours. Sepsis Labs: No results for input(s): PROCALCITON, LATICACIDVEN in the last 168 hours.  Recent Results (from the past 240 hour(s))  SARS Coronavirus 2 by RT PCR (hospital order, performed in [facility] hospital lab) Nasopharyngeal Nasopharyngeal Swab     Status: None  Collection Time: 10/23/19  4:16 AM   Specimen: Nasopharyngeal Swab  Result Value Ref Range Status   SARS Coronavirus 2 NEGATIVE NEGATIVE Final    Comment: (NOTE) SARS-CoV-2 target nucleic acids are NOT DETECTED.  The SARS-CoV-2 RNA is generally detectable in upper and lower respiratory specimens during the acute phase of infection. The lowest concentration of SARS-CoV-2 viral copies this assay can detect is 250 copies / mL. A negative result does not preclude SARS-CoV-2 infection and should not be used as the sole basis for treatment or other patient management decisions.  A negative result may occur with improper specimen collection / handling, submission of specimen other than nasopharyngeal swab, presence of viral mutation(s) within the areas targeted by this assay, and inadequate number of viral copies (<250 copies / mL). A negative result must be combined with clinical observations, patient  history, and epidemiological information.  Fact Sheet for Patients:   StrictlyIdeas.no  Fact Sheet for Healthcare Providers: BankingDealers.co.za  This test is not yet approved or  cleared by the [place] FDA and has been authorized for detection and/or diagnosis of SARS-CoV-2 by FDA under an Emergency Use Authorization (EUA).  This EUA will remain in effect (meaning this test can be used) for the duration of the COVID-19 declaration under Section 564(b)(1) of the Act, 21 U.S.C. section 360bbb-3(b)(1), unless the authorization is terminated or revoked sooner.  Performed at [facility], [place] [address] [place],  [address]          Radiology Studies: DG Foot 2 Views Right  Result Date: 10/24/2019 CLINICAL DATA:  Right foot pain and swelling. EXAM: RIGHT FOOT - 2 VIEW COMPARISON:  Remote radiograph 10/30/2004 FINDINGS: Hallux valgus with mild degenerative change at the first metatarsal phalangeal joint. Otherwise normal alignment. No fracture, erosion, periosteal reaction or bony destruction. Mild generalized soft tissue edema. IMPRESSION: 1. Generalized soft tissue edema. 2. Hallux valgus with mild degenerative change at the first metatarsophalangeal joint. Electronically Signed   By: Keith Rake M.D.   On: 10/24/2019 19:06   VAS [place] LOWER EXTREMITY VENOUS (DVT)  Result Date: 10/25/2019  Lower Venous DVTStudy Indications: Edema.  Comparison Study: Prior study from 12/29/15 is available for comparison Performing Technologist: Sharion Dove RVS  Examination Guidelines: A complete evaluation includes B-mode imaging, spectral Doppler, color Doppler, and power Doppler as needed of all accessible portions of each vessel. Bilateral testing is considered an integral part of a complete examination. Limited examinations for reoccurring indications may be performed as noted. The reflux portion of the exam is performed with the  patient in reverse Trendelenburg.  +---------+---------------+---------+-----------+----------+--------------+ RIGHT    CompressibilityPhasicitySpontaneityPropertiesThrombus Aging +---------+---------------+---------+-----------+----------+--------------+ CFV      Full           Yes      Yes                                 +---------+---------------+---------+-----------+----------+--------------+ SFJ      Full                                                        +---------+---------------+---------+-----------+----------+--------------+ FV Prox  Full                                                        +---------+---------------+---------+-----------+----------+--------------+  FV Mid   Full                                                        +---------+---------------+---------+-----------+----------+--------------+ FV DistalFull                                                        +---------+---------------+---------+-----------+----------+--------------+ PFV      Full                                                        +---------+---------------+---------+-----------+----------+--------------+ POP      Full           Yes      Yes                                 +---------+---------------+---------+-----------+----------+--------------+ PTV      Full                                                        +---------+---------------+---------+-----------+----------+--------------+ PERO     Full                                                        +---------+---------------+---------+-----------+----------+--------------+   +----+---------------+---------+-----------+----------+--------------+ LEFTCompressibilityPhasicitySpontaneityPropertiesThrombus Aging +----+---------------+---------+-----------+----------+--------------+ CFV Full           Yes      Yes                                  +----+---------------+---------+-----------+----------+--------------+     Summary: RIGHT: - Findings appear essentially unchanged compared to previous examination. - There is no evidence of deep vein thrombosis in the lower extremity.  - Ultrasound characteristics of enlarged lymph nodes are noted in the groin.  LEFT: - No evidence of common femoral vein obstruction.  *See table(s) above for measurements and observations. Electronically signed by Servando Snare MD on 10/25/2019 at 11:15:47 AM.    Final         Scheduled Meds: . aspirin EC  81 mg Oral Daily  . furosemide  60 mg Intravenous BID  . heparin  5,000 Units Subcutaneous Q8H  . hydrALAZINE  100 mg Oral Q6H  . nicotine  14 mg Transdermal Daily  . potassium chloride  20 mEq Oral BID  . scopolamine  1 patch Transdermal Q72H  . sodium chloride flush  3 mL Intravenous Q12H  . sodium chloride flush  3 mL Intravenous Q12H   Continuous Infusions: . sodium chloride    . nitroGLYCERIN 35 mcg/min (10/25/19 1323)  LOS: 0 days      Georgette Shell, MD  10/25/2019, 3:00 PM

## 2019-10-25 NOTE — Progress Notes (Signed)
Patient complained of 10/10 chest pain radiating across chest, SOB and tachypnic  BP 171/120, HR 80's, O2 99%, RR 25 4L [place] O2 applied, EKG obtained, MD paged Nitroglycerin gtt increased. New orders for chest xray, CT and labs. Patient now states chest pain only during inspiration.

## 2019-10-25 NOTE — Progress Notes (Signed)
VASCULAR LAB PRELIMINARY  PRELIMINARY  PRELIMINARY  PRELIMINARY  Right lower extremity venous duplex completed.    Preliminary report:  See CV proc for preliminary results.  [place], RVT 10/25/2019, 10:45 AM

## 2019-10-26 DIAGNOSIS — I5043 Acute on chronic combined systolic (congestive) and diastolic (congestive) heart failure: Secondary | ICD-10-CM

## 2019-10-26 DIAGNOSIS — N184 Chronic kidney disease, stage 4 (severe): Secondary | ICD-10-CM

## 2019-10-26 DIAGNOSIS — Z9119 Patient's noncompliance with other medical treatment and regimen: Secondary | ICD-10-CM

## 2019-10-26 DIAGNOSIS — N171 Acute kidney failure with acute cortical necrosis: Secondary | ICD-10-CM

## 2019-10-26 DIAGNOSIS — R001 Bradycardia, unspecified: Secondary | ICD-10-CM

## 2019-10-26 DIAGNOSIS — I161 Hypertensive emergency: Secondary | ICD-10-CM

## 2019-10-26 LAB — CBC
HCT: 44.3 % (ref 39.0–52.0)
Hemoglobin: 14.9 g/dL (ref 13.0–17.0)
MCH: 30.8 pg (ref 26.0–34.0)
MCHC: 33.6 g/dL (ref 30.0–36.0)
MCV: 91.7 fL (ref 80.0–100.0)
Platelets: 214 10*3/uL (ref 150–400)
RBC: 4.83 MIL/uL (ref 4.22–5.81)
RDW: 15.1 % (ref 11.5–15.5)
WBC: 5.5 10*3/uL (ref 4.0–10.5)
nRBC: 0 % (ref 0.0–0.2)

## 2019-10-26 LAB — BASIC METABOLIC PANEL
Anion gap: 10 (ref 5–15)
BUN: 23 mg/dL — ABNORMAL HIGH (ref 6–20)
CO2: 24 mmol/L (ref 22–32)
Calcium: 9 mg/dL (ref 8.9–10.3)
Chloride: 101 mmol/L (ref 98–111)
Creatinine, Ser: 2.4 mg/dL — ABNORMAL HIGH (ref 0.61–1.24)
GFR calc Af Amer: 35 mL/min — ABNORMAL LOW (ref >60)
GFR calc non Af Amer: 31 mL/min — ABNORMAL LOW (ref >60)
Glucose, Bld: 132 mg/dL — ABNORMAL HIGH (ref 70–99)
Potassium: 3.5 mmol/L (ref 3.5–5.1)
Sodium: 135 mmol/L (ref 135–145)

## 2019-10-26 MED ORDER — ISOSORBIDE MONONITRATE ER 30 MG PO TB24
30.0000 mg | ORAL_TABLET | Freq: Every day | ORAL | Status: DC
  Administered 2019-10-27 – 2019-10-29 (×3): 30 mg via ORAL
  Filled 2019-10-26 (×3): qty 1

## 2019-10-26 NOTE — Progress Notes (Signed)
Patient's HR drop to 40's, BP 122/87.  Patient asymptomatic. EKG obtained  MD paged.
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
## 2019-10-26 NOTE — Progress Notes (Signed)
PROGRESS NOTE    Helder Crisafulli  HKG:[id number] DOB: 29-Jun-1970 DOA: 10/23/2019 PCP: Patient, No Pcp Per    Brief Narrative: Georges Mouse a 49 y.o.malewith medical history significant forCAD status post CABG, chronic systolic CHF with EF 30 to 35%, accelerated hypertension, and now presenting to the ED for evaluation of chest discomfort and shortness of breath. Patient reports shortness of breath and chest discomfort over the past couple days without cough, fevers, or chills. The chest discomfort began at rest, seems worse with certain movements or laying on his left side, has been fairly constant, and associated with shortness of breath but no nausea or diaphoresis. He had some right foot swelling, pain, and redness a few days ago but that seems to have resolved. He reports adherence with his medications at home, reports that he tries to maintain a low-salt diet, but acknowledges consuming more fluids than recommended.He reports weighing himself daily with fluctuations between 210 and 225 pounds;states that he was closer to 225 last week butnow down closer to 210.  ED Course:Upon arrival to the ED, patient is found to be afebrile, saturating well on room air, normal RR and HR, and BP as high as 192/145. EKG features sinus rhythm with non-specific ST-T abnormalities that are similar to prior and QTc interval of 510 ms. CXR with cardiomegaly but no acute findings. Chemistry panel notable for potassium 3.0 and SCr 2.34. CBC unremarkable. HS troponin was 18, then 15. Patient was treated with oral hydralazine in ED and then started on nitroglycerin infusion. Hospitalists were asked to admit.   Assessment & Plan:   Principal Problem:   Hypertensive crisis Active Problems:   CKD (chronic kidney disease) stage 3, GFR 30-59 ml/min   Prolonged QT interval   Chronic combined systolic and diastolic CHF (congestive heart failure) (HCC)   CAD (coronary artery disease)   Chest  pain   Hypokalemia   Hypertensive emergency   #1 hypertensive urgency-likely secondary to dietary noncompliance. Patient reports he was compliant with his medical regime.  He was started on nitro drip still being continued on the drip. Blood pressure improved 161/102 since admission. Pressure 192/145 on arrival to ER. Increase hydralazine to 100 mg 4 times a day.     Add Norvasc 10 mg daily.   He was on Coreg which was stoppedas his heart rate was dipping down to the 40s. Added clonidine 6/19  #2 history of CAD/CABG/chronic systolic heart failure ejection fraction 30 to 35% February 2021. Chest x-ray clear. His weight is at baseline. Continue aspirin and Coreg. He is intolerant to statin due to muscle spasm and myopathy.  #3 hypokalemia potassium 3.4 repleted normal magnesium  #4 CKD stage IIIb appears to be at baseline, creatinine 2.40. Monitor closely. Dose medications renally adjusted.  #5 QT prolongation monitor closely replete lites avoid QT prolonging agents  #6 sinus brady consulted cardiology   Estimated body mass index is 32.11 kg/m as calculated from the following:   Height as of this encounter: 5\' 10"  (1.778 m).   Weight as of this encounter: 101.5 kg.    DVT prophylaxis:heparin Code Status:full Family Communication:dw patient Disposition Plan:Status CY:ELYHTMBPJ  Dispo: The patient is from:home Anticipated d/c is PE:TKKO Anticipated d/c date is: 6/121 Patient currently is not medically stable to d/c.still on nitro drip and multiple antihypertensives  Consultants:none  Procedures:none Antimicrobials:none  Subjective: Patient resting in bed last evening he had an episode of shortness of breath tachypnea tachycardia and right-sided chest pain for which a CT of  the chest was done stat to rule out PE which did not reveal PE however this was done without contrast.  Troponin is flat D-dimer was  0.5  Objective: Vitals:   10/26/19 0624 10/26/19 0928 10/26/19 1129 10/26/19 1250  BP: (!) 143/100 (!) 141/104 (!) 136/103 138/88  Pulse:    60  Resp:   16 19  Temp: 98 F (36.7 C)   (!) 97.3 F (36.3 C)  TempSrc: Oral   Oral  SpO2:    95%  Weight: 101.5 kg     Height:        Intake/Output Summary (Last 24 hours) at 10/26/2019 1415 Last data filed at 10/26/2019 0659 Gross per 24 hour  Intake 232.87 ml  Output 2125 ml  Net -1892.13 ml   Filed Weights   10/23/19 1429 10/25/19 0606 10/26/19 0624  Weight: 102.5 kg 101.4 kg 101.5 kg    Examination:  General exam: Appears calm and comfortable  Respiratory system: Clear to auscultation. Respiratory effort normal. Cardiovascular system: S1 & S2 heard, RRR. No JVD, murmurs, rubs, gallops or clicks. No pedal edema. Gastrointestinal system: Abdomen is nondistended, soft and nontender. No organomegaly or masses felt. Normal bowel sounds heard. Central nervous system: Alert and oriented. No focal neurological deficits. Extremities: Symmetric 5 x 5 power. Skin: No rashes, lesions or ulcers Psychiatry: Judgement and insight appear normal. Mood & affect appropriate.     Data Reviewed: I have personally reviewed following labs and imaging studies  CBC: Recent Labs  Lab 10/23/19 0050 10/24/19 0434 10/25/19 0422 10/26/19 0928  WBC 6.6 6.0 6.9 5.5  HGB 16.1 15.5 15.5 14.9  HCT 49.0 45.7 45.1 44.3  MCV 94.0 91.6 91.3 91.7  PLT 232 223 231 629   Basic Metabolic Panel: Recent Labs  Lab 10/23/19 0050 10/23/19 0259 10/24/19 0434 10/25/19 0422 10/26/19 0928  NA 136  --  136 137 135  K 3.0*  --  3.4* 3.3* 3.5  CL 99  --  101 102 101  CO2 25  --  24 24 24   GLUCOSE 103*  --  121* 92 132*  BUN 19  --  22* 28* 23*  CREATININE 2.34*  --  2.31* 2.63* 2.40*  CALCIUM 9.0  --  9.2 8.8* 9.0  MG  --  2.0 2.1  --   --    GFR: Estimated Creatinine Clearance: 44.4 mL/min (A) (by C-G formula based on SCr of 2.4 mg/dL (H)). Liver  Function Tests: No results for input(s): AST, ALT, ALKPHOS, BILITOT, PROT, ALBUMIN in the last 168 hours. No results for input(s): LIPASE, AMYLASE in the last 168 hours. No results for input(s): AMMONIA in the last 168 hours. Coagulation Profile: No results for input(s): INR, PROTIME in the last 168 hours. Cardiac Enzymes: No results for input(s): CKTOTAL, CKMB, CKMBINDEX, TROPONINI in the last 168 hours. BNP (last 3 results) No results for input(s): PROBNP in the last 8760 hours. HbA1C: No results for input(s): HGBA1C in the last 72 hours. CBG: No results for input(s): GLUCAP in the last 168 hours. Lipid Profile: No results for input(s): CHOL, HDL, LDLCALC, TRIG, CHOLHDL, LDLDIRECT in the last 72 hours. Thyroid Function Tests: No results for input(s): TSH, T4TOTAL, FREET4, T3FREE, THYROIDAB in the last 72 hours. Anemia Panel: No results for input(s): VITAMINB12, FOLATE, FERRITIN, TIBC, IRON, RETICCTPCT in the last 72 hours. Sepsis Labs: No results for input(s): PROCALCITON, LATICACIDVEN in the last 168 hours.  Recent Results (from the past 240 hour(s))  SARS Coronavirus  2 by RT PCR (hospital order, performed in [facility] hospital lab) Nasopharyngeal Nasopharyngeal Swab     Status: None   Collection Time: 10/23/19  4:16 AM   Specimen: Nasopharyngeal Swab  Result Value Ref Range Status   SARS Coronavirus 2 NEGATIVE NEGATIVE Final    Comment: (NOTE) SARS-CoV-2 target nucleic acids are NOT DETECTED.  The SARS-CoV-2 RNA is generally detectable in upper and lower respiratory specimens during the acute phase of infection. The lowest concentration of SARS-CoV-2 viral copies this assay can detect is 250 copies / mL. A negative result does not preclude SARS-CoV-2 infection and should not be used as the sole basis for treatment or other patient management decisions.  A negative result may occur with improper specimen collection / handling, submission of specimen other than  nasopharyngeal swab, presence of viral mutation(s) within the areas targeted by this assay, and inadequate number of viral copies (<250 copies / mL). A negative result must be combined with clinical observations, patient history, and epidemiological information.  Fact Sheet for Patients:   StrictlyIdeas.no  Fact Sheet for Healthcare Providers: BankingDealers.co.za  This test is not yet approved or  cleared by the [place] FDA and has been authorized for detection and/or diagnosis of SARS-CoV-2 by FDA under an Emergency Use Authorization (EUA).  This EUA will remain in effect (meaning this test can be used) for the duration of the COVID-19 declaration under Section 564(b)(1) of the Act, 21 U.S.C. section 360bbb-3(b)(1), unless the authorization is terminated or revoked sooner.  Performed at [facility], [name] [address], [place], [place] [address]          Radiology Studies: DG Chest 1 View  Result Date: 10/25/2019 CLINICAL DATA:  Chest pain. EXAM: CHEST  1 VIEW COMPARISON:  October 23, 2019 FINDINGS: Multiple sternal wires and vascular clips are seen. There is no evidence of acute infiltrate, pleural effusion or pneumothorax. The heart size and mediastinal contours are within normal limits. There is tortuosity of the descending thoracic aorta. The visualized skeletal structures are unremarkable. IMPRESSION: 1. Evidence of prior median sternotomy/CABG. 2. No acute or active cardiopulmonary disease. Electronically Signed   By: Virgina Norfolk M.D.   On: 10/25/2019 19:03   CT CHEST WO CONTRAST  Result Date: 10/25/2019 CLINICAL DATA:  Hypoxemia EXAM: CT CHEST WITHOUT CONTRAST TECHNIQUE: Multidetector CT imaging of the chest was performed following the standard protocol without IV contrast. COMPARISON:  02/09/2018 and 01/12/2015 CT, 10/25/2019 x-ray FINDINGS: Cardiovascular: Median sternotomy and prior CABG. Heart size is mildly  enlarged. No pericardial effusion. Ascending thoracic aorta measures 4.1 cm at the level of the right main pulmonary artery, stable from 2019. Scattered atherosclerotic calcification of the aorta and native coronary arteries. Main pulmonary trunk is not dilated. Mediastinum/Nodes: Mildly enlarged precarinal lymph node measuring 12 mm short axis, stable from 2019. No axillary lymphadenopathy. No enlarged hilar lymph nodes, although evaluation is somewhat limited in the absence of intravenous contrast. Unremarkable thyroid, trachea, and esophagus. Lungs/Pleura: Stable perifissural 3-4 mm right middle lobe pulmonary nodule (series 4, image 84), benign. Minimal right basilar subsegmental atelectasis. The lungs are otherwise clear. No focal airspace consolidation. No pleural effusion or pneumothorax. Upper Abdomen: No acute findings within the visualized upper abdomen. Musculoskeletal: No chest wall mass or suspicious bone lesions identified. IMPRESSION: 1. No acute findings within the chest. 2. 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 Recommend annual imaging followup by CTA or MRA. This recommendation follows 2010  ACCF/AHA/AATS/ACR/ASA/SCA/SCAI/SIR/STS/SVM Guidelines for the Diagnosis and Management of Patients with Thoracic Aortic Disease. Circulation. 2010; 121: G254-Y706. Aortic aneurysm NOS (ICD10-I71.9) 3. Nonspecific mildly enlarged precarinal lymph node measuring up to 12 mm short axis, stable from 2019. 4. Aortic atherosclerosis. (ICD10-I70.0). Electronically Signed   By: Davina Poke D.O.   On: 10/25/2019 19:58   DG Foot 2 Views Right  Result Date: 10/24/2019 CLINICAL DATA:  Right foot pain and swelling. EXAM: RIGHT FOOT - 2 VIEW COMPARISON:  Remote radiograph 10/30/2004 FINDINGS: Hallux valgus with mild degenerative change at the first metatarsal phalangeal joint. Otherwise normal alignment. No fracture, erosion, periosteal reaction or bony destruction.  Mild generalized soft tissue edema. IMPRESSION: 1. Generalized soft tissue edema. 2. Hallux valgus with mild degenerative change at the first metatarsophalangeal joint. Electronically Signed   By: Keith Rake M.D.   On: 10/24/2019 19:06   VAS [place] LOWER EXTREMITY VENOUS (DVT)  Result Date: 10/25/2019  Lower Venous DVTStudy Indications: Edema.  Comparison Study: Prior study from 12/29/15 is available for comparison Performing Technologist: Sharion Dove RVS  Examination Guidelines: A complete evaluation includes B-mode imaging, spectral Doppler, color Doppler, and power Doppler as needed of all accessible portions of each vessel. Bilateral testing is considered an integral part of a complete examination. Limited examinations for reoccurring indications may be performed as noted. The reflux portion of the exam is performed with the patient in reverse Trendelenburg.  +---------+---------------+---------+-----------+----------+--------------+ RIGHT    CompressibilityPhasicitySpontaneityPropertiesThrombus Aging +---------+---------------+---------+-----------+----------+--------------+ CFV      Full           Yes      Yes                                 +---------+---------------+---------+-----------+----------+--------------+ SFJ      Full                                                        +---------+---------------+---------+-----------+----------+--------------+ FV Prox  Full                                                        +---------+---------------+---------+-----------+----------+--------------+ FV Mid   Full                                                        +---------+---------------+---------+-----------+----------+--------------+ FV DistalFull                                                        +---------+---------------+---------+-----------+----------+--------------+ PFV      Full                                                         +---------+---------------+---------+-----------+----------+--------------+  POP      Full           Yes      Yes                                 +---------+---------------+---------+-----------+----------+--------------+ PTV      Full                                                        +---------+---------------+---------+-----------+----------+--------------+ PERO     Full                                                        +---------+---------------+---------+-----------+----------+--------------+   +----+---------------+---------+-----------+----------+--------------+ LEFTCompressibilityPhasicitySpontaneityPropertiesThrombus Aging +----+---------------+---------+-----------+----------+--------------+ CFV Full           Yes      Yes                                 +----+---------------+---------+-----------+----------+--------------+     Summary: RIGHT: - Findings appear essentially unchanged compared to previous examination. - There is no evidence of deep vein thrombosis in the lower extremity.  - Ultrasound characteristics of enlarged lymph nodes are noted in the groin.  LEFT: - No evidence of common femoral vein obstruction.  *See table(s) above for measurements and observations. Electronically signed by Servando Snare MD on 10/25/2019 at 11:15:47 AM.    Final         Scheduled Meds: . amLODipine  10 mg Oral Daily  . aspirin EC  81 mg Oral Daily  . cloNIDine  0.2 mg Oral BID  . heparin  5,000 Units Subcutaneous Q8H  . hydrALAZINE  100 mg Oral Q6H  . nicotine  14 mg Transdermal Daily  . potassium chloride  20 mEq Oral BID  . scopolamine  1 patch Transdermal Q72H  . sodium chloride flush  3 mL Intravenous Q12H  . sodium chloride flush  3 mL Intravenous Q12H   Continuous Infusions: . sodium chloride    . nitroGLYCERIN 5 mcg/min (10/26/19 1158)     LOS: 1 day     Georgette Shell, MD  Password [facility] 10/26/2019, 2:15 PM

## 2019-10-27 ENCOUNTER — Inpatient Hospital Stay (HOSPITAL_COMMUNITY): Payer: Medicaid Other

## 2019-10-27 DIAGNOSIS — I5021 Acute systolic (congestive) heart failure: Secondary | ICD-10-CM

## 2019-10-27 LAB — BASIC METABOLIC PANEL
Anion gap: 8 (ref 5–15)
BUN: 21 mg/dL — ABNORMAL HIGH (ref 6–20)
CO2: 24 mmol/L (ref 22–32)
Calcium: 8.8 mg/dL — ABNORMAL LOW (ref 8.9–10.3)
Chloride: 102 mmol/L (ref 98–111)
Creatinine, Ser: 2.31 mg/dL — ABNORMAL HIGH (ref 0.61–1.24)
GFR calc Af Amer: 37 mL/min — ABNORMAL LOW (ref >60)
GFR calc non Af Amer: 32 mL/min — ABNORMAL LOW (ref >60)
Glucose, Bld: 79 mg/dL (ref 70–99)
Potassium: 3.6 mmol/L (ref 3.5–5.1)
Sodium: 134 mmol/L — ABNORMAL LOW (ref 135–145)

## 2019-10-27 LAB — ECHOCARDIOGRAM COMPLETE
Height: 70 in
Weight: 3619.07 oz

## 2019-10-27 MED ORDER — CARVEDILOL 12.5 MG PO TABS
12.5000 mg | ORAL_TABLET | Freq: Two times a day (BID) | ORAL | Status: DC
  Administered 2019-10-27 – 2019-10-29 (×3): 12.5 mg via ORAL
  Filled 2019-10-27 (×4): qty 1

## 2019-10-27 NOTE — Progress Notes (Signed)
PROGRESS NOTE    Scott Salinas  XLK:[id number] DOB: Oct 24, 1970 DOA: 10/23/2019 PCP: Patient, No Pcp Per    Brief Narrative: Scott Salinas a 49 y.o.malewith medical history significant forCAD status post CABG, chronic systolic CHF with EF 30 to 35%, accelerated hypertension, and now presenting to the ED for evaluation of chest discomfort and shortness of breath. Patient reports shortness of breath and chest discomfort over the past couple days without cough, fevers, or chills. The chest discomfort began at rest, seems worse with certain movements or laying on his left side, has been fairly constant, and associated with shortness of breath but no nausea or diaphoresis. He had some right foot swelling, pain, and redness a few days ago but that seems to have resolved. He reports adherence with his medications at home, reports that he tries to maintain a low-salt diet, but acknowledges consuming more fluids than recommended.He reports weighing himself daily with fluctuations between 210 and 225 pounds;states that he was closer to 225 last week butnow down closer to 210.  ED Course:Upon arrival to the ED, patient is found to be afebrile, saturating well on room air, normal RR and HR, and BP as high as 192/145. EKG features sinus rhythm with non-specific ST-T abnormalities that are similar to prior and QTc interval of 510 ms. CXR with cardiomegaly but no acute findings. Chemistry panel notable for potassium 3.0 and SCr 2.34. CBC unremarkable. HS troponin was 18, then 15. Patient was treated with oral hydralazine in ED and then started on nitroglycerin infusion. Hospitalists were asked to admit.   Assessment & Plan:   Principal Problem:   Hypertensive crisis Active Problems:   CKD (chronic kidney disease) stage 3, GFR 30-59 ml/min   Prolonged QT interval   Chronic combined systolic and diastolic CHF (congestive heart failure) (HCC)   CAD (coronary artery disease)   Chest  pain   Hypokalemia   Hypertensive emergency   Sinus bradycardia   #1 hypertensive urgency-secondary to dietary and medical  noncompliance.  He was started on nitro drip still being continued on the drip. Blood pressure improved 135/97  since admission. Pressure 192/145 on arrival to ER.  hydralazine to 100 mg 4 times a day.     Norvasc 10 mg daily.   He was on Coreg which was stoppedas his heart rate was dipping down to the 40s. Added clonidine 6/19 Diuretics on hold due to rising creatinine BMP pending today. Titrate nitro to DC  #2 history of CAD/CABG/chronic systolic heart failure ejection fraction 30 to 35% February 2021. Chest x-ray clear.  Repeat echo today. His weight is at baseline. Continue aspirin and Coreg. He is intolerant to statin due to muscle spasm and myopathy.  #3 hypokalemia potassium 3.4 repleted normal magnesium  #4 CKD stage IIIb appears to be at baseline, creatinine 2.40. Monitor closely. Dose medications renally adjusted.  Follow-up labs today.  #5 QT prolongation monitor closely replete lites avoid QT prolonging agents QTC 477 down from 511  #6 sinus brady-continue to hold beta-blocker.  Appreciate cardiology recommendations.  Patient asymptomatic with these episodes.  However he continues to complain of bilateral chest pain.  Estimated body mass index is 32.46 kg/m as calculated from the following:   Height as of this encounter: 5\' 10"  (1.778 m).   Weight as of this encounter: 102.6 kg.    DVT prophylaxis:heparin Code Status:full Family Communication:dw patient Disposition Plan:Status DG:[id number]  Dispo: The patient is from:home Anticipated d/c is VZ:DGLO Anticipated d/c date is: 6/22 Patient currently  is not medically stable to d/c.still on nitro drip and multiple antihypertensives  Consultants:none  Procedures:none Antimicrobials:none  Subjective: Patient resting in bed  continues to complain of vague chest pain.  Work-up so far negative.  Remains on nitro glycerin drip.  Cardiology consulted. Objective: Vitals:   10/26/19 1731 10/26/19 2040 10/26/19 2359 10/27/19 0612  BP: (!) 147/102 (!) 168/103 (!) 149/114 (!) 135/97  Pulse:    75  Resp:    19  Temp:  97.7 F (36.5 C)  98 F (36.7 C)  TempSrc:    Oral  SpO2:   99% 96%  Weight:    102.6 kg  Height:        Intake/Output Summary (Last 24 hours) at 10/27/2019 1115 Last data filed at 10/27/2019 0200 Gross per 24 hour  Intake 49.5 ml  Output 1225 ml  Net -1175.5 ml   Filed Weights   10/25/19 0606 10/26/19 0624 10/27/19 0612  Weight: 101.4 kg 101.5 kg 102.6 kg    Examination:  General exam: Appears calm and comfortable  Respiratory system: Clear to auscultation. Respiratory effort normal. Cardiovascular system: S1 & S2 heard, RRR. No JVD, murmurs, rubs, gallops or clicks. No pedal edema. Gastrointestinal system: Abdomen is nondistended, soft and nontender. No organomegaly or masses felt. Normal bowel sounds heard. Central nervous system: Alert and oriented. No focal neurological deficits. Extremities: Symmetric 5 x 5 power. Skin: No rashes, lesions or ulcers Psychiatry: Judgement and insight appear normal. Mood & affect appropriate.     Data Reviewed: I have personally reviewed following labs and imaging studies  CBC: Recent Labs  Lab 10/23/19 0050 10/24/19 0434 10/25/19 0422 10/26/19 0928  WBC 6.6 6.0 6.9 5.5  HGB 16.1 15.5 15.5 14.9  HCT 49.0 45.7 45.1 44.3  MCV 94.0 91.6 91.3 91.7  PLT 232 223 231 301   Basic Metabolic Panel: Recent Labs  Lab 10/23/19 0050 10/23/19 0259 10/24/19 0434 10/25/19 0422 10/26/19 0928  NA 136  --  136 137 135  K 3.0*  --  3.4* 3.3* 3.5  CL 99  --  101 102 101  CO2 25  --  24 24 24   GLUCOSE 103*  --  121* 92 132*  BUN 19  --  22* 28* 23*  CREATININE 2.34*  --  2.31* 2.63* 2.40*  CALCIUM 9.0  --  9.2 8.8* 9.0  MG  --  2.0 2.1  --   --     GFR: Estimated Creatinine Clearance: 44.7 mL/min (A) (by C-G formula based on SCr of 2.4 mg/dL (H)). Liver Function Tests: No results for input(s): AST, ALT, ALKPHOS, BILITOT, PROT, ALBUMIN in the last 168 hours. No results for input(s): LIPASE, AMYLASE in the last 168 hours. No results for input(s): AMMONIA in the last 168 hours. Coagulation Profile: No results for input(s): INR, PROTIME in the last 168 hours. Cardiac Enzymes: No results for input(s): CKTOTAL, CKMB, CKMBINDEX, TROPONINI in the last 168 hours. BNP (last 3 results) No results for input(s): PROBNP in the last 8760 hours. HbA1C: No results for input(s): HGBA1C in the last 72 hours. CBG: No results for input(s): GLUCAP in the last 168 hours. Lipid Profile: No results for input(s): CHOL, HDL, LDLCALC, TRIG, CHOLHDL, LDLDIRECT in the last 72 hours. Thyroid Function Tests: No results for input(s): TSH, T4TOTAL, FREET4, T3FREE, THYROIDAB in the last 72 hours. Anemia Panel: No results for input(s): VITAMINB12, FOLATE, FERRITIN, TIBC, IRON, RETICCTPCT in the last 72 hours. Sepsis Labs: No results for input(s): PROCALCITON, LATICACIDVEN  in the last 168 hours.  Recent Results (from the past 240 hour(s))  SARS Coronavirus 2 by RT PCR (hospital order, performed in [facility] hospital lab) Nasopharyngeal Nasopharyngeal Swab     Status: None   Collection Time: 10/23/19  4:16 AM   Specimen: Nasopharyngeal Swab  Result Value Ref Range Status   SARS Coronavirus 2 NEGATIVE NEGATIVE Final    Comment: (NOTE) SARS-CoV-2 target nucleic acids are NOT DETECTED.  The SARS-CoV-2 RNA is generally detectable in upper and lower respiratory specimens during the acute phase of infection. The lowest concentration of SARS-CoV-2 viral copies this assay can detect is 250 copies / mL. A negative result does not preclude SARS-CoV-2 infection and should not be used as the sole basis for treatment or other patient management decisions.  A  negative result may occur with improper specimen collection / handling, submission of specimen other than nasopharyngeal swab, presence of viral mutation(s) within the areas targeted by this assay, and inadequate number of viral copies (<250 copies / mL). A negative result must be combined with clinical observations, patient history, and epidemiological information.  Fact Sheet for Patients:   StrictlyIdeas.no  Fact Sheet for Healthcare Providers: BankingDealers.co.za  This test is not yet approved or  cleared by the [place] FDA and has been authorized for detection and/or diagnosis of SARS-CoV-2 by FDA under an Emergency Use Authorization (EUA).  This EUA will remain in effect (meaning this test can be used) for the duration of the COVID-19 declaration under Section 564(b)(1) of the Act, 21 U.S.C. section 360bbb-3(b)(1), unless the authorization is terminated or revoked sooner.  Performed at [facility], [place] [address] [place], [place] [address]          Radiology Studies: DG Chest 1 View  Result Date: 10/25/2019 CLINICAL DATA:  Chest pain. EXAM: CHEST  1 VIEW COMPARISON:  October 23, 2019 FINDINGS: Multiple sternal wires and vascular clips are seen. There is no evidence of acute infiltrate, pleural effusion or pneumothorax. The heart size and mediastinal contours are within normal limits. There is tortuosity of the descending thoracic aorta. The visualized skeletal structures are unremarkable. IMPRESSION: 1. Evidence of prior median sternotomy/CABG. 2. No acute or active cardiopulmonary disease. Electronically Signed   By: Virgina Norfolk M.D.   On: 10/25/2019 19:03   CT CHEST WO CONTRAST  Result Date: 10/25/2019 CLINICAL DATA:  Hypoxemia EXAM: CT CHEST WITHOUT CONTRAST TECHNIQUE: Multidetector CT imaging of the chest was performed following the standard protocol without IV contrast. COMPARISON:  02/09/2018 and  01/12/2015 CT, 10/25/2019 x-ray FINDINGS: Cardiovascular: Median sternotomy and prior CABG. Heart size is mildly enlarged. No pericardial effusion. Ascending thoracic aorta measures 4.1 cm at the level of the right main pulmonary artery, stable from 2019. Scattered atherosclerotic calcification of the aorta and native coronary arteries. Main pulmonary trunk is not dilated. Mediastinum/Nodes: Mildly enlarged precarinal lymph node measuring 12 mm short axis, stable from 2019. No axillary lymphadenopathy. No enlarged hilar lymph nodes, although evaluation is somewhat limited in the absence of intravenous contrast. Unremarkable thyroid, trachea, and esophagus. Lungs/Pleura: Stable perifissural 3-4 mm right middle lobe pulmonary nodule (series 4, image 84), benign. Minimal right basilar subsegmental atelectasis. The lungs are otherwise clear. No focal airspace consolidation. No pleural effusion or pneumothorax. Upper Abdomen: No acute findings within the visualized upper abdomen. Musculoskeletal: No chest wall mass or suspicious bone lesions identified. IMPRESSION: 1. No acute findings within the chest. 2. 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 Recommend annual imaging followup by CTA or MRA. This recommendation follows 2010 ACCF/AHA/AATS/ACR/ASA/SCA/SCAI/SIR/STS/SVM Guidelines for the Diagnosis and Management of Patients with Thoracic Aortic Disease. Circulation. 2010; 121: H403-T248. Aortic aneurysm NOS (ICD10-I71.9) 3. Nonspecific mildly enlarged precarinal lymph node measuring up to 12 mm short axis, stable from 2019. 4. Aortic atherosclerosis. (ICD10-I70.0). Electronically Signed   By: Davina Poke D.O.   On: 10/25/2019 19:58        Scheduled Meds: . amLODipine  10 mg Oral Daily  . aspirin EC  81 mg Oral Daily  . cloNIDine  0.2 mg Oral BID  . heparin  5,000 Units Subcutaneous Q8H  . hydrALAZINE  100 mg Oral Q6H  . isosorbide mononitrate  30 mg Oral  Daily  . nicotine  14 mg Transdermal Daily  . potassium chloride  20 mEq Oral BID  . scopolamine  1 patch Transdermal Q72H  . sodium chloride flush  3 mL Intravenous Q12H  . sodium chloride flush  3 mL Intravenous Q12H   Continuous Infusions: . sodium chloride    . nitroGLYCERIN 8 mcg/min (10/27/19 0829)     LOS: 2 days     Georgette Shell, MD  Password [facility] 10/27/2019, 11:15 AM

## 2019-10-27 NOTE — Progress Notes (Signed)
  Echocardiogram 2D Echocardiogram has been performed.  Scott Salinas 10/27/2019, 2:04 PM

## 2019-10-27 NOTE — Progress Notes (Addendum)
HR dropped to 38 non sustained. No heart block noted. Strip saved per CCMD. Pt watching TV, no complaints noted. Will continue to monitor. Jessie Foot, RN

## 2019-10-27 NOTE — Progress Notes (Addendum)
Progress Note  Patient Name: Scott Salinas Date of Encounter: 10/27/2019  [facility] Cardiologist: Loralie Champagne, MD   Subjective   Overnight heart rates went down into the upper 30s, now back in the 60s. Patient asymptomatic. He says he still has intermittent chest pain. Continues to be on IV nitro for BP.   Inpatient Medications    Scheduled Meds: . amLODipine  10 mg Oral Daily  . aspirin EC  81 mg Oral Daily  . cloNIDine  0.2 mg Oral BID  . heparin  5,000 Units Subcutaneous Q8H  . hydrALAZINE  100 mg Oral Q6H  . isosorbide mononitrate  30 mg Oral Daily  . nicotine  14 mg Transdermal Daily  . potassium chloride  20 mEq Oral BID  . scopolamine  1 patch Transdermal Q72H  . sodium chloride flush  3 mL Intravenous Q12H  . sodium chloride flush  3 mL Intravenous Q12H   Continuous Infusions: . sodium chloride    . nitroGLYCERIN 10 mcg/min (10/27/19 0114)   PRN Meds: sodium chloride, acetaminophen **OR** acetaminophen, HYDROcodone-acetaminophen, HYDROmorphone (DILAUDID) injection, promethazine, senna-docusate, sodium chloride flush   Vital Signs    Vitals:   10/26/19 1731 10/26/19 2040 10/26/19 2359 10/27/19 0612  BP: (!) 147/102 (!) 168/103 (!) 149/114 (!) 135/97  Pulse:    75  Resp:    19  Temp:  97.7 F (36.5 C)  98 F (36.7 C)  TempSrc:    Oral  SpO2:   99% 96%  Weight:    102.6 kg  Height:        Intake/Output Summary (Last 24 hours) at 10/27/2019 0732 Last data filed at 10/27/2019 0200 Gross per 24 hour  Intake 49.5 ml  Output 1225 ml  Net -1175.5 ml   Last 3 Weights 10/27/2019 10/26/2019 10/25/2019  Weight (lbs) 226 lb 3.1 oz 223 lb 12.8 oz 223 lb 9.6 oz  Weight (kg) 102.6 kg 101.515 kg 101.424 kg  Some encounter information is confidential and restricted. Go to Review Flowsheets activity to see all data.      Telemetry    Sinus, HR generally in the 60s, down into upper 30's overnight, PVCs- Personally Reviewed  ECG    Sinus bradycardia, 47  bpm, PVC, LVH, TWI anterolateral leads - Personally Reviewed  Physical Exam   GEN: No acute distress.   Neck: No JVD Cardiac: RRR, no murmurs, rubs, or gallops.  Respiratory: Clear to auscultation bilaterally. GI: Soft, nontender, non-distended  MS: No edema; No deformity. Neuro:  Nonfocal  Psych: Normal affect   Labs    High Sensitivity Troponin:   Recent Labs  Lab 10/23/19 0050 10/23/19 0259 10/25/19 1831 10/25/19 2027  TROPONINIHS 18* 15 12 14       Chemistry Recent Labs  Lab 10/24/19 0434 10/25/19 0422 10/26/19 0928  NA 136 137 135  K 3.4* 3.3* 3.5  CL 101 102 101  CO2 24 24 24   GLUCOSE 121* 92 132*  BUN 22* 28* 23*  CREATININE 2.31* 2.63* 2.40*  CALCIUM 9.2 8.8* 9.0  GFRNONAA 32* 27* 31*  GFRAA 37* 32* 35*  ANIONGAP 11 11 10      Hematology Recent Labs  Lab 10/24/19 0434 10/25/19 0422 10/26/19 0928  WBC 6.0 6.9 5.5  RBC 4.99 4.94 4.83  HGB 15.5 15.5 14.9  HCT 45.7 45.1 44.3  MCV 91.6 91.3 91.7  MCH 31.1 31.4 30.8  MCHC 33.9 34.4 33.6  RDW 15.1 15.1 15.1  PLT 223 231 214  BNPNo results for input(s): BNP, PROBNP in the last 168 hours.   DDimer  Recent Labs  Lab 10/25/19 1831  DDIMER 0.54*     Radiology    DG Chest 1 View  Result Date: 10/25/2019 CLINICAL DATA:  Chest pain. EXAM: CHEST  1 VIEW COMPARISON:  October 23, 2019 FINDINGS: Multiple sternal wires and vascular clips are seen. There is no evidence of acute infiltrate, pleural effusion or pneumothorax. The heart size and mediastinal contours are within normal limits. There is tortuosity of the descending thoracic aorta. The visualized skeletal structures are unremarkable. IMPRESSION: 1. Evidence of prior median sternotomy/CABG. 2. No acute or active cardiopulmonary disease. Electronically Signed   By: Virgina Norfolk M.D.   On: 10/25/2019 19:03   CT CHEST WO CONTRAST  Result Date: 10/25/2019 CLINICAL DATA:  Hypoxemia EXAM: CT CHEST WITHOUT CONTRAST TECHNIQUE: Multidetector CT  imaging of the chest was performed following the standard protocol without IV contrast. COMPARISON:  02/09/2018 and 01/12/2015 CT, 10/25/2019 x-ray FINDINGS: Cardiovascular: Median sternotomy and prior CABG. Heart size is mildly enlarged. No pericardial effusion. Ascending thoracic aorta measures 4.1 cm at the level of the right main pulmonary artery, stable from 2019. Scattered atherosclerotic calcification of the aorta and native coronary arteries. Main pulmonary trunk is not dilated. Mediastinum/Nodes: Mildly enlarged precarinal lymph node measuring 12 mm short axis, stable from 2019. No axillary lymphadenopathy. No enlarged hilar lymph nodes, although evaluation is somewhat limited in the absence of intravenous contrast. Unremarkable thyroid, trachea, and esophagus. Lungs/Pleura: Stable perifissural 3-4 mm right middle lobe pulmonary nodule (series 4, image 84), benign. Minimal right basilar subsegmental atelectasis. The lungs are otherwise clear. No focal airspace consolidation. No pleural effusion or pneumothorax. Upper Abdomen: No acute findings within the visualized upper abdomen. Musculoskeletal: No chest wall mass or suspicious bone lesions identified. IMPRESSION: 1. No acute findings within the chest. 2. 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 Recommend annual imaging followup by CTA or MRA. This recommendation follows 2010 ACCF/AHA/AATS/ACR/ASA/SCA/SCAI/SIR/STS/SVM Guidelines for the Diagnosis and Management of Patients with Thoracic Aortic Disease. Circulation. 2010; 121: U045-W098. Aortic aneurysm NOS (ICD10-I71.9) 3. Nonspecific mildly enlarged precarinal lymph node measuring up to 12 mm short axis, stable from 2019. 4. Aortic atherosclerosis. (ICD10-I70.0). Electronically Signed   By: Davina Poke D.O.   On: 10/25/2019 19:58   VAS [place] LOWER EXTREMITY VENOUS (DVT)  Result Date: 10/25/2019  Lower Venous DVTStudy Indications: Edema.  Comparison  Study: Prior study from 12/29/15 is available for comparison Performing Technologist: Sharion Dove RVS  Examination Guidelines: A complete evaluation includes B-mode imaging, spectral Doppler, color Doppler, and power Doppler as needed of all accessible portions of each vessel. Bilateral testing is considered an integral part of a complete examination. Limited examinations for reoccurring indications may be performed as noted. The reflux portion of the exam is performed with the patient in reverse Trendelenburg.  +---------+---------------+---------+-----------+----------+--------------+ RIGHT    CompressibilityPhasicitySpontaneityPropertiesThrombus Aging +---------+---------------+---------+-----------+----------+--------------+ CFV      Full           Yes      Yes                                 +---------+---------------+---------+-----------+----------+--------------+ SFJ      Full                                                        +---------+---------------+---------+-----------+----------+--------------+  FV Prox  Full                                                        +---------+---------------+---------+-----------+----------+--------------+ FV Mid   Full                                                        +---------+---------------+---------+-----------+----------+--------------+ FV DistalFull                                                        +---------+---------------+---------+-----------+----------+--------------+ PFV      Full                                                        +---------+---------------+---------+-----------+----------+--------------+ POP      Full           Yes      Yes                                 +---------+---------------+---------+-----------+----------+--------------+ PTV      Full                                                         +---------+---------------+---------+-----------+----------+--------------+ PERO     Full                                                        +---------+---------------+---------+-----------+----------+--------------+   +----+---------------+---------+-----------+----------+--------------+ LEFTCompressibilityPhasicitySpontaneityPropertiesThrombus Aging +----+---------------+---------+-----------+----------+--------------+ CFV Full           Yes      Yes                                 +----+---------------+---------+-----------+----------+--------------+     Summary: RIGHT: - Findings appear essentially unchanged compared to previous examination. - There is no evidence of deep vein thrombosis in the lower extremity.  - Ultrasound characteristics of enlarged lymph nodes are noted in the groin.  LEFT: - No evidence of common femoral vein obstruction.  *See table(s) above for measurements and observations. Electronically signed by Servando Snare MD on 10/25/2019 at 11:15:47 AM.    Final     Cardiac Studies   Echo 06/2019  1. Left ventricular ejection fraction, by visual estimation, is 30 to  35%. The left ventricle has moderate to severely decreased function. There  is no left ventricular hypertrophy.   2.  Moderately dilated left ventricular internal cavity size.   3. The left ventricle demonstrates regional wall motion abnormalities.   4. Septal apical and inferior basal hypokinesis.   5. Global right ventricle has normal systolic function.The right  ventricular size is normal. No increase in right ventricular wall  thickness.   6. Left atrial size was moderately dilated.   7. Right atrial size was mildly dilated.   8. The mitral valve is normal in structure. Mild mitral valve  regurgitation. No evidence of mitral stenosis.   9. The tricuspid valve is normal in structure.  10. The tricuspid valve is normal in structure. Tricuspid valve  regurgitation is trivial.  11. The  aortic valve is tricuspid. Aortic valve regurgitation is not  visualized. No evidence of aortic valve sclerosis or stenosis.  12. The pulmonic valve was normal in structure. Pulmonic valve  regurgitation is not visualized.  13. Normal pulmonary artery systolic pressure.  14. The inferior vena cava is normal in size with greater than 50%  respiratory variability, suggesting right atrial pressure of 3 mmHg.  Cardiac cath 2017  Prox RCA lesion, 100 %stenosed.  Prox Cx lesion, 50 %stenosed.  Prox LAD-1 lesion, 90 %stenosed.  Prox LAD-2 lesion, 70 %stenosed.  Mid LAD lesion, 50 %stenosed.  Seq SVG-.  Origin lesion, 100 %stenosed.  LIMA and is normal in caliber and anatomically normal.  RIMA and is normal in caliber and anatomically normal.  Ost 1st Mrg to 1st Mrg lesion, 95 %stenosed.     Hemodynamics:   Ao = 139/105 (122) LV =  129/19   Assessment:   1. 3v CAD  2. SVG to D2/D3 is occluded 3. LIMA to LAD with Y-graft RIMA (off LIMA) to OM-1 widely patent 4, RCA totally occluded (chronic) 5. Persistent HTN   Plan:   Graft to diagonal system is occluded. Other grafts patent. LVED 19. BP up. Will increase hydralazine to 75 tid. Hydrate gently. Likely home in am.   Bensimhon, Daniel,MD 12:39 PM    Patient Profile     49 y.o. 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  Assessment & Plan   Sinus bradycardia - Found to have HR down into the 40s, asymptomatic - history of noncompliance with coreg as it causes headaches. He was prescribed coreg 18.75mg BID - Patient restarted on coreg and has short episodes of bradycardia, BB held 6/18 for bradycardia - overnight rates  are low, will continue to hold BB. Could possibly try low dose BB. MD to see - Echo ordered  Hypertensive urgency - started on IV nitro - Hydralazine 100 mg TID - Intolerance to Imdur due to headaches, low dose 30 mg retrialed - Coreg held for bradycardia - No Entresto/ACEi due to angioedema - amlodipine 10 mg - clonidine 0.2mg  BID - Pressures improved but still elevated - try to wean off IV nitro.   Acute on chronic systolic CHF/Ischemic cardiomyopathy - Echo 10/18 with EF 20-25% - Echo 02/2018 LVEF 20-25% - Echo 06/2019 LVEF30-35%, moderately dilated LV, regional wall motion abnormalities, septal apical and inferior basal hypokinesis, mod dilated LA, mildly dilated RA, mild MR, trivial TR - Echo pending 10/26/19 - On admission patient was started on IV lasix and diuresed 3.2L>>discontinued 6/19 for rising  creatinine/BUN. 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 - Last cath showed occluded SVG-D1/D2 - Chest pain likely related to CHF and hypertensive urgency - EKG is non acute - HS troponin 18>15>12>!4 - ASA - BB held as above - statin stopped 2/2/ to elevated CK >2500 - Repatha, follows with lipid clinic  AKI/CKD stage 3 - baseline creatinine around 2 - creatinine 2.34>2.31>2.63>2.40  OSA - refuses CPAP   For questions or updates, please contact [facility] Please consult [email or web address] for contact info under        Signed, Cadence Ninfa Meeker, PA-C  10/27/2019, 7:32 AM    I have seen and examined the patient along with Cadence Ninfa Meeker, PA-C.  I have reviewed the chart, notes and new data.  I agree with PA/NP's note.  Key new complaints: mild HA. No dyspnea, no edema. Describes a 4-day episode of podagra (left great MTP joint erythema, exquisite tenderness, swelling) before admission. Key examination changes: no JVD, rales, S3 or edema. DBP  high Key new findings / data: stable creatinine. No daytime bradycardia.  PLAN: Suspect bradycardia is partly due to untreated OSA. Reduce carvedilol dose, but should not stop them outright. Tolerate nocturnal bradycardia down to 40 bpm and asymptomatic pauses < 2.5 seconds.  Needs beta blocker for CHF, CAD and difficult to control HTN. Wean off IV NTG. I think he had a gout attack. Check uric acid. Avoid meat/seafood/organ meats.  Avoid thiazides.  Sanda Klein, MD, [place] [phone] 10/27/2019, 1:57 PM

## 2019-10-28 LAB — BASIC METABOLIC PANEL
Anion gap: 9 (ref 5–15)
BUN: 22 mg/dL — ABNORMAL HIGH (ref 6–20)
CO2: 23 mmol/L (ref 22–32)
Calcium: 8.6 mg/dL — ABNORMAL LOW (ref 8.9–10.3)
Chloride: 103 mmol/L (ref 98–111)
Creatinine, Ser: 2.23 mg/dL — ABNORMAL HIGH (ref 0.61–1.24)
GFR calc Af Amer: 39 mL/min — ABNORMAL LOW (ref >60)
GFR calc non Af Amer: 33 mL/min — ABNORMAL LOW (ref >60)
Glucose, Bld: 119 mg/dL — ABNORMAL HIGH (ref 70–99)
Potassium: 3.5 mmol/L (ref 3.5–5.1)
Sodium: 135 mmol/L (ref 135–145)

## 2019-10-28 LAB — URIC ACID: Uric Acid, Serum: 8.7 mg/dL — ABNORMAL HIGH (ref 3.7–8.6)

## 2019-10-28 LAB — MAGNESIUM: Magnesium: 2.3 mg/dL (ref 1.7–2.4)

## 2019-10-28 MED ORDER — SPIRONOLACTONE 12.5 MG HALF TABLET
12.5000 mg | ORAL_TABLET | Freq: Every day | ORAL | Status: DC
  Administered 2019-10-28: 12.5 mg via ORAL
  Filled 2019-10-28 (×2): qty 1

## 2019-10-28 MED ORDER — TORSEMIDE 20 MG PO TABS
40.0000 mg | ORAL_TABLET | Freq: Every evening | ORAL | Status: DC
  Administered 2019-10-28: 40 mg via ORAL
  Filled 2019-10-28 (×2): qty 2

## 2019-10-28 MED ORDER — TORSEMIDE 20 MG PO TABS
60.0000 mg | ORAL_TABLET | Freq: Every day | ORAL | Status: DC
  Administered 2019-10-28 – 2019-10-29 (×2): 60 mg via ORAL
  Filled 2019-10-28 (×3): qty 3

## 2019-10-28 MED ORDER — HYDRALAZINE HCL 50 MG PO TABS
100.0000 mg | ORAL_TABLET | Freq: Three times a day (TID) | ORAL | Status: DC
  Administered 2019-10-28 – 2019-10-29 (×2): 100 mg via ORAL
  Filled 2019-10-28 (×2): qty 2

## 2019-10-28 NOTE — Progress Notes (Addendum)
Progress Note  Patient Name: Scott Salinas Date of Encounter: 10/28/2019  [facility] Cardiologist: Loralie Champagne, MD   Subjective   Low dose coreg restarted. Heart rates in the 60s. Denies chest pain. He is anxious to go home. He put out 1L urine overnight.   Inpatient Medications    Scheduled Meds: . amLODipine  10 mg Oral Daily  . aspirin EC  81 mg Oral Daily  . carvedilol  12.5 mg Oral BID WC  . cloNIDine  0.2 mg Oral BID  . heparin  5,000 Units Subcutaneous Q8H  . hydrALAZINE  100 mg Oral Q6H  . isosorbide mononitrate  30 mg Oral Daily  . nicotine  14 mg Transdermal Daily  . potassium chloride  20 mEq Oral BID  . scopolamine  1 patch Transdermal Q72H  . sodium chloride flush  3 mL Intravenous Q12H  . sodium chloride flush  3 mL Intravenous Q12H  . torsemide  60 mg Oral Daily   Continuous Infusions: . sodium chloride    . nitroGLYCERIN Stopped (10/27/19 1434)   PRN Meds: sodium chloride, acetaminophen **OR** acetaminophen, HYDROcodone-acetaminophen, HYDROmorphone (DILAUDID) injection, promethazine, senna-docusate, sodium chloride flush   Vital Signs    Vitals:   10/27/19 2150 10/27/19 2310 10/28/19 0430 10/28/19 0623  BP: (!) 155/117 (!) 162/123 (!) 142/106 (!) 151/111  Pulse: 81  66   Resp: 18  (!) 21   Temp: 98.1 F (36.7 C)  98.2 F (36.8 C)   TempSrc: Oral  Oral   SpO2: 100%  100%   Weight:      Height:        Intake/Output Summary (Last 24 hours) at 10/28/2019 0724 Last data filed at 10/28/2019 0124 Gross per 24 hour  Intake 34.46 ml  Output 1000 ml  Net -965.54 ml   Last 3 Weights 10/27/2019 10/26/2019 10/25/2019  Weight (lbs) 226 lb 3.1 oz 223 lb 12.8 oz 223 lb 9.6 oz  Weight (kg) 102.6 kg 101.515 kg 101.424 kg  Some encounter information is confidential and restricted. Go to Review Flowsheets activity to see all data.      Telemetry    NSR, Hr 60-70s, PVCs - Personally Reviewed  ECG    No new - Personally Reviewed  Physical Exam    GEN: No acute distress.   Neck: No JVD Cardiac: RRR, no murmurs, rubs, or gallops.  Respiratory: Clear to auscultation bilaterally. GI: Soft, nontender, non-distended  MS: No edema; No deformity. Neuro:  Nonfocal  Psych: Normal affect   Labs    High Sensitivity Troponin:   Recent Labs  Lab 10/23/19 0050 10/23/19 0259 10/25/19 1831 10/25/19 2027  TROPONINIHS 18* 15 12 14       Chemistry Recent Labs  Lab 10/26/19 0928 10/27/19 1224 10/28/19 0422  NA 135 134* 135  K 3.5 3.6 3.5  CL 101 102 103  CO2 24 24 23   GLUCOSE 132* 79 119*  BUN 23* 21* 22*  CREATININE 2.40* 2.31* 2.23*  CALCIUM 9.0 8.8* 8.6*  GFRNONAA 31* 32* 33*  GFRAA 35* 37* 39*  ANIONGAP 10 8 9      Hematology Recent Labs  Lab 10/24/19 0434 10/25/19 0422 10/26/19 0928  WBC 6.0 6.9 5.5  RBC 4.99 4.94 4.83  HGB 15.5 15.5 14.9  HCT 45.7 45.1 44.3  MCV 91.6 91.3 91.7  MCH 31.1 31.4 30.8  MCHC 33.9 34.4 33.6  RDW 15.1 15.1 15.1  PLT 223 231 214    BNPNo results for input(s): BNP, PROBNP  in the last 168 hours.   DDimer  Recent Labs  Lab 10/25/19 1831  DDIMER 0.54*     Radiology    ECHOCARDIOGRAM COMPLETE  Result Date: 10/27/2019    ECHOCARDIOGRAM REPORT   Patient Name:   Scott Salinas Date of Exam: 10/27/2019 Medical Rec #:  [id number]         Height:       70.0 in Accession #:    [id number]        Weight:       226.2 lb Date of Birth:  [birth year]         BSA:          2.199 m Patient Age:    49 years          BP:           135/97 mmHg Patient Gender: M                 HR:           75 bpm. Exam Location:  Inpatient Procedure: 2D Echo, Cardiac Doppler and Color Doppler Indications:    CHF-Acute Systolic  History:        Patient has prior history of Echocardiogram examinations, most                 recent 06/09/2019. CHF, CAD, Prior CABG; Risk Factors:Dyslipidemia                 and Hypertension. CKD. Polysubstance abuse.  Sonographer:    Clayton Lefort RDCS (AE) Referring Phys: [id number] [place]  1. Global hypokinesis with akinesis of the inferolateral wall; overall moderate to severe LV dysfunction; restrictive filling; severe LVH; moderate LVE; mild AI; mildly dilated ascending aorta; mild MR; severe biatrial enlargement; moderate RV dysfunction.  2. Left ventricular ejection fraction, by estimation, is 30 to 35%. The left ventricle has moderate to severely decreased function. The left ventricle demonstrates regional wall motion abnormalities (see scoring diagram/findings for description). The left ventricular internal cavity size was moderately dilated. There is severe left ventricular hypertrophy. Left ventricular diastolic parameters are consistent with Grade III diastolic dysfunction (restrictive). Elevated left atrial pressure.  3. Right ventricular systolic function is moderately reduced. The right ventricular size is normal. There is moderately elevated pulmonary artery systolic pressure.  4. Left atrial size was severely dilated.  5. Right atrial size was severely dilated.  6. The mitral valve is normal in structure. Mild mitral valve regurgitation. No evidence of mitral stenosis.  7. The aortic valve is tricuspid. Aortic valve regurgitation is mild. Mild aortic valve sclerosis is present, with no evidence of aortic valve stenosis.  8. Aortic dilatation noted. There is mild dilatation of the aortic root measuring 40 mm.  9. The inferior vena cava is dilated in size with <50% respiratory variability, suggesting right atrial pressure of 15 mmHg. FINDINGS  Left Ventricle: Left ventricular ejection fraction, by estimation, is 30 to 35%. The left ventricle has moderate to severely decreased function. The left ventricle demonstrates regional wall motion abnormalities. The left ventricular internal cavity size was moderately dilated. There is severe left ventricular hypertrophy. Left ventricular diastolic parameters are consistent with Grade III diastolic dysfunction (restrictive).  Elevated left atrial pressure. Right Ventricle: The right ventricular size is normal.Right ventricular systolic function is moderately reduced. There is moderately elevated pulmonary artery systolic pressure. The tricuspid regurgitant velocity is 3.23 m/s, and with an assumed right atrial pressure of 15 mmHg, the estimated  right ventricular systolic pressure is 24.2 mmHg. Left Atrium: Left atrial size was severely dilated. Right Atrium: Right atrial size was severely dilated. Pericardium: There is no evidence of pericardial effusion. Mitral Valve: The mitral valve is normal in structure. Normal mobility of the mitral valve leaflets. Mild mitral valve regurgitation. No evidence of mitral valve stenosis. MV peak gradient, 6.0 mmHg. The mean mitral valve gradient is 2.0 mmHg. Tricuspid Valve: The tricuspid valve is normal in structure. Tricuspid valve regurgitation is mild . No evidence of tricuspid stenosis. Aortic Valve: The aortic valve is tricuspid. Aortic valve regurgitation is mild. Aortic regurgitation PHT measures 748 msec. Mild aortic valve sclerosis is present, with no evidence of aortic valve stenosis. Aortic valve mean gradient measures 3.0 mmHg. Aortic valve peak gradient measures 4.2 mmHg. Aortic valve area, by VTI measures 4.11 cm. Pulmonic Valve: The pulmonic valve was normal in structure. Pulmonic valve regurgitation is trivial. No evidence of pulmonic stenosis. Aorta: Aortic dilatation noted. There is mild dilatation of the aortic root measuring 40 mm. Venous: The inferior vena cava is dilated in size with less than 50% respiratory variability, suggesting right atrial pressure of 15 mmHg. IAS/Shunts: No atrial level shunt detected by color flow Doppler. Additional Comments: Global hypokinesis with akinesis of the inferolateral wall; overall moderate to severe LV dysfunction; restrictive filling; severe LVH; moderate LVE; mild AI; mildly dilated ascending aorta; mild MR; severe biatrial enlargement;  moderate RV dysfunction.  LEFT VENTRICLE PLAX 2D LVIDd:         5.90 cm      Diastology LVIDs:         5.00 cm      LV e' lateral:   5.35 cm/s LV PW:         1.70 cm      LV E/e' lateral: 18.5 LV IVS:        2.20 cm      LV e' medial:    5.40 cm/s LVOT diam:     2.60 cm      LV E/e' medial:  18.3 LV SV:         78 LV SV Index:   35 LVOT Area:     5.31 cm  LV Volumes (MOD) LV vol d, MOD A2C: 317.0 ml LV vol d, MOD A4C: 272.0 ml LV vol s, MOD A2C: 217.0 ml LV vol s, MOD A4C: 190.0 ml LV SV MOD A2C:     100.0 ml LV SV MOD A4C:     272.0 ml LV SV MOD BP:      92.2 ml RIGHT VENTRICLE            IVC RV Basal diam:  4.00 cm    IVC diam: 2.80 cm RV Mid diam:    3.00 cm RV S prime:     6.81 cm/s TAPSE (M-mode): 1.4 cm LEFT ATRIUM              Index       RIGHT ATRIUM           Index LA diam:        5.40 cm  2.46 cm/m  RA Area:     37.50 cm LA Vol (A2C):   175.0 ml 79.57 ml/m RA Volume:   151.00 ml 68.66 ml/m LA Vol (A4C):   144.0 ml 65.47 ml/m LA Biplane Vol: 162.0 ml 73.66 ml/m  AORTIC VALVE AV Area (Vmax):    4.35 cm AV Area (Vmean):   3.83 cm AV  Area (VTI):     4.11 cm AV Vmax:           102.00 cm/s AV Vmean:          77.700 cm/s AV VTI:            0.190 m AV Peak Grad:      4.2 mmHg AV Mean Grad:      3.0 mmHg LVOT Vmax:         83.60 cm/s LVOT Vmean:        56.000 cm/s LVOT VTI:          0.147 m LVOT/AV VTI ratio: 0.77 AI PHT:            748 msec  AORTA Ao Root diam: 4.10 cm Ao Asc diam:  3.80 cm MITRAL VALVE               TRICUSPID VALVE MV Area (PHT): 3.72 cm    TR Peak grad:   41.7 mmHg MV Peak grad:  6.0 mmHg    TR Vmax:        323.00 cm/s MV Mean grad:  2.0 mmHg MV Vmax:       1.22 m/s    SHUNTS MV Vmean:      56.9 cm/s   Systemic VTI:  0.15 m MV Decel Time: 204 msec    Systemic Diam: 2.60 cm MV E velocity: 99.00 cm/s MV A velocity: 21.80 cm/s MV E/A ratio:  4.54 Kirk Ruths MD Electronically signed by Kirk Ruths MD Signature Date/Time: 10/27/2019/3:00:37 PM    Final     Cardiac Studies   Echo  06/2019  1. Left ventricular ejection fraction, by visual estimation, is 30 to  35%. The left ventricle has moderate to severely decreased function. There  is no left ventricular hypertrophy.   2. Moderately dilated left ventricular internal cavity size.   3. The left ventricle demonstrates regional wall motion abnormalities.   4. Septal apical and inferior basal hypokinesis.   5. Global right ventricle has normal systolic function.The right  ventricular size is normal. No increase in right ventricular wall  thickness.   6. Left atrial size was moderately dilated.   7. Right atrial size was mildly dilated.   8. The mitral valve is normal in structure. Mild mitral valve  regurgitation. No evidence of mitral stenosis.   9. The tricuspid valve is normal in structure.  10. The tricuspid valve is normal in structure. Tricuspid valve  regurgitation is trivial.  11. The aortic valve is tricuspid. Aortic valve regurgitation is not  visualized. No evidence of aortic valve sclerosis or stenosis.  12. The pulmonic valve was normal in structure. Pulmonic valve  regurgitation is not visualized.  13. Normal pulmonary artery systolic pressure.  14. The inferior vena cava is normal in size with greater than 50%  respiratory variability, suggesting right atrial pressure of 3 mmHg.   Cardiac cath 2017  Prox RCA lesion, 100 %stenosed.  Prox Cx lesion, 50 %stenosed.  Prox LAD-1 lesion, 90 %stenosed.  Prox LAD-2 lesion, 70 %stenosed.  Mid LAD lesion, 50 %stenosed.  Seq SVG-.  Origin lesion, 100 %stenosed.  LIMA and is normal in caliber and anatomically normal.  RIMA and is normal in caliber and anatomically normal.  Ost 1st Mrg to 1st Mrg lesion, 95 %stenosed.     Hemodynamics:   Ao = 139/105 (122) LV =  129/19   Assessment:   1. 3v CAD  2. SVG to D2/D3 is  occluded 3. LIMA to LAD with Y-graft RIMA (off LIMA) to OM-1 widely patent 4, RCA totally occluded (chronic) 5. Persistent  HTN   Plan:   Graft to diagonal system is occluded. Other grafts patent. LVED 19. BP up. Will increase hydralazine to 75 tid. Hydrate gently. Likely home in am.   Bensimhon, Daniel,MD 12:39 PM    Patient Profile     49 y.o. 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  Assessment & Plan    Sinus bradycardia - Found to have HR down into the 40s, asymptomatic - history of noncompliance with coreg as it causes headaches. He was prescribed coreg 18.75mg BID - Patient restarted on coreg and has short episodes of bradycardia, BB held 6/18 for bradycardia - Lower dose of Coreg started 6/22, 12.5mg  BID - heart rates overnight in the 60s  Hypertensive urgency - started on IV nitro - Hydralazine 100 mg TID - Intolerance to Imdur due to headaches, low dose 30 mg retrialed - Coreg initially held for bradycardia>>12.5mg  BID restarted - No Entresto/ACEi due to angioedema - amlodipine 10 mg - clonidine 0.2mg  BID - weaned off IV nitro yesterday - BP this AM 151/111. Consider increasing Imdur, although has caused headaches. Not many other options at this point   Acute on chronic systolic CHF/Ischemic cardiomyopathy - Echo 10/18 with EF 20-25% - Echo 02/2018 LVEF 20-25% - Echo 06/2019 LVEF30-35%, moderately dilated LV, regional wall motion abnormalities, septal apical and inferior basal hypokinesis, mod dilated LA, mildly dilated RA, mild MR, trivial TR - Echo pending with LVEF 30-35%, global hypokinesis with akinesis of inferolateral wall, moderate to severe LVE, mild AI, mildly dilated AA, mild MR, severe biatrial enlargement, moderate RV function - On admission patient was started on IV lasix and  diuresed 3.2L>>discontinued 6/19 for rising creatinine/BUN.  - continue hydralazine - Torsemide 60 mg daily restarted. 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 - Last cath showed occluded SVG-D1/D2 - Chest pain likely related to CHF and hypertensive urgency - EKG is non acute - HS troponin 18>15>12>!4 - ASA - Coreg 12.5mg  BID - statin stopped 2/2/ to elevated CK >2500 - Repatha, follows with lipid clinic   AKI/CKD stage 3 - baseline creatinine around 2 - creatinine 2.34>2.31>2.63>2.40>2.31>2.23   OSA - refuses CPAP    For questions or updates, please contact [facility] Please consult [email or web address] for contact info under        Signed, Cadence Ninfa Meeker, PA-C  10/28/2019, 7:24 AM    I have seen and examined the patient along with Cadence Ninfa Meeker, PA-C .  I have reviewed the chart, notes and new data.  I agree with PA/NP's note.  Key new complaints: he is a little agitated and nervous because his family has left for the beach and he is worried someone will break into his house Key examination changes: clear lungs. S3 present. BP very high (especially DBP) Key new findings / data: stable renal function, K lower end of normal.  PLAN: On max tolerated doses of carvedilol (bradycardia), amlodipine, clonidine and isosorbide (headache)and high dose hydralazine.  Not on RAAS inh due to renal dysfunction. Consider  minoxidil. Discussed w Dr. [name] - will try adding low dose spironolactone while monitoring K and renal parameters.  Sanda Klein, MD, [place] [phone] 10/28/2019, 11:04 AM
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
## 2019-10-28 NOTE — Progress Notes (Addendum)
PROGRESS NOTE    Scott Salinas  GQQ:[id number] DOB: 12-Oct-1970 DOA: 10/23/2019 PCP: Patient, No Pcp Per    Brief Narrative: Scott Salinas a 49 y.o.malewith medical history significant forCAD status post CABG, chronic systolic CHF with EF 30 to 35%, accelerated hypertension, and now presenting to the ED for evaluation of chest discomfort and shortness of breath. Patient reports shortness of breath and chest discomfort over the past couple days without cough, fevers, or chills. The chest discomfort began at rest, seems worse with certain movements or laying on his left side, has been fairly constant, and associated with shortness of breath but no nausea or diaphoresis. He had some right foot swelling, pain, and redness a few days ago but that seems to have resolved. He reports adherence with his medications at home, reports that he tries to maintain a low-salt diet, but acknowledges consuming more fluids than recommended.He reports weighing himself daily with fluctuations between 210 and 225 pounds;states that he was closer to 225 last week butnow down closer to 210.  ED Course:Upon arrival to the ED, patient is found to be afebrile, saturating well on room air, normal RR and HR, and BP as high as 192/145. EKG features sinus rhythm with non-specific ST-T abnormalities that are similar to prior and QTc interval of 510 ms. CXR with cardiomegaly but no acute findings. Chemistry panel notable for potassium 3.0 and SCr 2.34. CBC unremarkable. HS troponin was 18, then 15. Patient was treated with oral hydralazine in ED and then started on nitroglycerin infusion. Hospitalists were asked to admit.   Assessment & Plan:   Principal Problem:   Hypertensive crisis Active Problems:   CKD (chronic kidney disease) stage 3, GFR 30-59 ml/min   Prolonged QT interval   Chronic combined systolic and diastolic CHF (congestive heart failure) (HCC)   CAD (coronary artery disease)   Chest  pain   Hypokalemia   Hypertensive emergency   Sinus bradycardia   #1 hypertensive urgency-secondary to dietary and medical  noncompliance.  He was started on nitro drip which has been stopped.  Blood pressure still high but improved.  Pressure 192/145 on arrival to ER.  hydralazine to 100 mg 4 times a day.     Norvasc 10 mg daily.    Imdur 30 mg daily Coreg restarted by cardiology.. Added clonidine 6/19 Diuretics restarted today Aldactone added today monitor renal functions  #2 history of CAD/CABG/chronic systolic heart failure ejection fraction 30 to 35% February 2021. Chest x-ray clear.  Echo global hypokinesis with akinesis of the inferolateral wall moderate to severe LV dysfunction 30 to 35% EF. Continue aspirin and Coreg. He is intolerant to statin due to muscle spasm and myopathy.  #3 hypokalemia potassium 3.4 repleted normal magnesium  #4 CKD stage IIIb appears to be at baseline, creatinine 2.23. Monitor closely. Dose medications renally adjusted.    #5 QT prolongation monitor closely replete lites avoid QT prolonging agents QTC 477 down from 511  #6 sinus brady-continue to hold beta-blocker.  Appreciate cardiology recommendations.  Patient asymptomatic with these episodes.  However he continues to complain of bilateral chest pain.  Estimated body mass index is 32.27 kg/m as calculated from the following:   Height as of this encounter: 5\' 10"  (1.778 m).   Weight as of this encounter: 102 kg.    DVT prophylaxis:heparin Code Status:full Family Communication:dw patient Disposition Plan:Status IZ:[id number]  Dispo: The patient is from:home Anticipated d/c is PJ:ASNK Anticipated d/c date is: 6/23 Patient currently is not medically stable to  d/c.still adjusting antihypertensives and remains hypertensive Consultants:none  Procedures:none Antimicrobials:none  Subjective: Patient resting in bed continues to  complain of vague chest pain.  Work-up so far negative.  Remains on nitro glycerin drip.  Cardiology consulted. Objective: Vitals:   10/27/19 2310 10/28/19 0400 10/28/19 0430 10/28/19 0623  BP: (!) 162/123  (!) 142/106 (!) 151/111  Pulse:   66   Resp:   (!) 21   Temp:   98.2 F (36.8 C)   TempSrc:   Oral   SpO2:   100%   Weight:  102 kg    Height:        Intake/Output Summary (Last 24 hours) at 10/28/2019 1505 Last data filed at 10/28/2019 1200 Gross per 24 hour  Intake 496.46 ml  Output 2825 ml  Net -2328.54 ml   Filed Weights   10/26/19 0624 10/27/19 0612 10/28/19 0400  Weight: 101.5 kg 102.6 kg 102 kg    Examination:  General exam: Appears calm and comfortable  Respiratory system: Clear to auscultation. Respiratory effort normal. Cardiovascular system: S1 & S2 heard, RRR. No JVD, murmurs, rubs, gallops or clicks. No pedal edema. Gastrointestinal system: Abdomen is nondistended, soft and nontender. No organomegaly or masses felt. Normal bowel sounds heard. Central nervous system: Alert and oriented. No focal neurological deficits. Extremities: Symmetric 5 x 5 power. Skin: No rashes, lesions or ulcers Psychiatry: Judgement and insight appear normal. Mood & affect appropriate.     Data Reviewed: I have personally reviewed following labs and imaging studies  CBC: Recent Labs  Lab 10/23/19 0050 10/24/19 0434 10/25/19 0422 10/26/19 0928  WBC 6.6 6.0 6.9 5.5  HGB 16.1 15.5 15.5 14.9  HCT 49.0 45.7 45.1 44.3  MCV 94.0 91.6 91.3 91.7  PLT 232 223 231 580   Basic Metabolic Panel: Recent Labs  Lab 10/23/19 0050 10/23/19 0259 10/24/19 0434 10/25/19 0422 10/26/19 0928 10/27/19 1224 10/28/19 0422  NA   < >  --  136 137 135 134* 135  K   < >  --  3.4* 3.3* 3.5 3.6 3.5  CL   < >  --  101 102 101 102 103  CO2   < >  --  24 24 24 24 23   GLUCOSE   < >  --  121* 92 132* 79 119*  BUN   < >  --  22* 28* 23* 21* 22*  CREATININE   < >  --  2.31* 2.63* 2.40* 2.31* 2.23*   CALCIUM   < >  --  9.2 8.8* 9.0 8.8* 8.6*  MG  --  2.0 2.1  --   --   --   --    < > = values in this interval not displayed.   GFR: Estimated Creatinine Clearance: 47.9 mL/min (A) (by C-G formula based on SCr of 2.23 mg/dL (H)). Liver Function Tests: No results for input(s): AST, ALT, ALKPHOS, BILITOT, PROT, ALBUMIN in the last 168 hours. No results for input(s): LIPASE, AMYLASE in the last 168 hours. No results for input(s): AMMONIA in the last 168 hours. Coagulation Profile: No results for input(s): INR, PROTIME in the last 168 hours. Cardiac Enzymes: No results for input(s): CKTOTAL, CKMB, CKMBINDEX, TROPONINI in the last 168 hours. BNP (last 3 results) No results for input(s): PROBNP in the last 8760 hours. HbA1C: No results for input(s): HGBA1C in the last 72 hours. CBG: No results for input(s): GLUCAP in the last 168 hours. Lipid Profile: No results for input(s): CHOL, HDL,  LDLCALC, TRIG, CHOLHDL, LDLDIRECT in the last 72 hours. Thyroid Function Tests: No results for input(s): TSH, T4TOTAL, FREET4, T3FREE, THYROIDAB in the last 72 hours. Anemia Panel: No results for input(s): VITAMINB12, FOLATE, FERRITIN, TIBC, IRON, RETICCTPCT in the last 72 hours. Sepsis Labs: No results for input(s): PROCALCITON, LATICACIDVEN in the last 168 hours.  Recent Results (from the past 240 hour(s))  SARS Coronavirus 2 by RT PCR (hospital order, performed in [facility] hospital lab) Nasopharyngeal Nasopharyngeal Swab     Status: None   Collection Time: 10/23/19  4:16 AM   Specimen: Nasopharyngeal Swab  Result Value Ref Range Status   SARS Coronavirus 2 NEGATIVE NEGATIVE Final    Comment: (NOTE) SARS-CoV-2 target nucleic acids are NOT DETECTED.  The SARS-CoV-2 RNA is generally detectable in upper and lower respiratory specimens during the acute phase of infection. The lowest concentration of SARS-CoV-2 viral copies this assay can detect is 250 copies / mL. A negative result does not  preclude SARS-CoV-2 infection and should not be used as the sole basis for treatment or other patient management decisions.  A negative result may occur with improper specimen collection / handling, submission of specimen other than nasopharyngeal swab, presence of viral mutation(s) within the areas targeted by this assay, and inadequate number of viral copies (<250 copies / mL). A negative result must be combined with clinical observations, patient history, and epidemiological information.  Fact Sheet for Patients:   StrictlyIdeas.no  Fact Sheet for Healthcare Providers: BankingDealers.co.za  This test is not yet approved or  cleared by the [place] FDA and has been authorized for detection and/or diagnosis of SARS-CoV-2 by FDA under an Emergency Use Authorization (EUA).  This EUA will remain in effect (meaning this test can be used) for the duration of the COVID-19 declaration under Section 564(b)(1) of the Act, 21 U.S.C. section 360bbb-3(b)(1), unless the authorization is terminated or revoked sooner.  Performed at [facility], [place] [address], [place], [place] [address]          Radiology Studies: ECHOCARDIOGRAM COMPLETE  Result Date: 10/27/2019    ECHOCARDIOGRAM REPORT   Patient Name:   ALLISON SILVA Date of Exam: 10/27/2019 Medical Rec #:  [id number]         Height:       70.0 in Accession #:    [id number]        Weight:       226.2 lb Date of Birth:  [birth year]         BSA:          2.199 m Patient Age:    60 years          BP:           135/97 mmHg Patient Gender: M                 HR:           75 bpm. Exam Location:  Inpatient Procedure: 2D Echo, Cardiac Doppler and Color Doppler Indications:    CHF-Acute Systolic  History:        Patient has prior history of Echocardiogram examinations, most                 recent 06/09/2019. CHF, CAD, Prior CABG; Risk Factors:Dyslipidemia                 and Hypertension. CKD.  Polysubstance abuse.  Sonographer:    Clayton Lefort RDCS (AE) Referring Phys: [id number] Jamse [place]  NELSON IMPRESSIONS  1. Global hypokinesis with akinesis of the inferolateral wall; overall moderate to severe LV dysfunction; restrictive filling; severe LVH; moderate LVE; mild AI; mildly dilated ascending aorta; mild MR; severe biatrial enlargement; moderate RV dysfunction.  2. Left ventricular ejection fraction, by estimation, is 30 to 35%. The left ventricle has moderate to severely decreased function. The left ventricle demonstrates regional wall motion abnormalities (see scoring diagram/findings for description). The left ventricular internal cavity size was moderately dilated. There is severe left ventricular hypertrophy. Left ventricular diastolic parameters are consistent with Grade III diastolic dysfunction (restrictive). Elevated left atrial pressure.  3. Right ventricular systolic function is moderately reduced. The right ventricular size is normal. There is moderately elevated pulmonary artery systolic pressure.  4. Left atrial size was severely dilated.  5. Right atrial size was severely dilated.  6. The mitral valve is normal in structure. Mild mitral valve regurgitation. No evidence of mitral stenosis.  7. The aortic valve is tricuspid. Aortic valve regurgitation is mild. Mild aortic valve sclerosis is present, with no evidence of aortic valve stenosis.  8. Aortic dilatation noted. There is mild dilatation of the aortic root measuring 40 mm.  9. The inferior vena cava is dilated in size with <50% respiratory variability, suggesting right atrial pressure of 15 mmHg. FINDINGS  Left Ventricle: Left ventricular ejection fraction, by estimation, is 30 to 35%. The left ventricle has moderate to severely decreased function. The left ventricle demonstrates regional wall motion abnormalities. The left ventricular internal cavity size was moderately dilated. There is severe left ventricular hypertrophy. Left  ventricular diastolic parameters are consistent with Grade III diastolic dysfunction (restrictive). Elevated left atrial pressure. Right Ventricle: The right ventricular size is normal.Right ventricular systolic function is moderately reduced. There is moderately elevated pulmonary artery systolic pressure. The tricuspid regurgitant velocity is 3.23 m/s, and with an assumed right atrial pressure of 15 mmHg, the estimated right ventricular systolic pressure is 71.2 mmHg. Left Atrium: Left atrial size was severely dilated. Right Atrium: Right atrial size was severely dilated. Pericardium: There is no evidence of pericardial effusion. Mitral Valve: The mitral valve is normal in structure. Normal mobility of the mitral valve leaflets. Mild mitral valve regurgitation. No evidence of mitral valve stenosis. MV peak gradient, 6.0 mmHg. The mean mitral valve gradient is 2.0 mmHg. Tricuspid Valve: The tricuspid valve is normal in structure. Tricuspid valve regurgitation is mild . No evidence of tricuspid stenosis. Aortic Valve: The aortic valve is tricuspid. Aortic valve regurgitation is mild. Aortic regurgitation PHT measures 748 msec. Mild aortic valve sclerosis is present, with no evidence of aortic valve stenosis. Aortic valve mean gradient measures 3.0 mmHg. Aortic valve peak gradient measures 4.2 mmHg. Aortic valve area, by VTI measures 4.11 cm. Pulmonic Valve: The pulmonic valve was normal in structure. Pulmonic valve regurgitation is trivial. No evidence of pulmonic stenosis. Aorta: Aortic dilatation noted. There is mild dilatation of the aortic root measuring 40 mm. Venous: The inferior vena cava is dilated in size with less than 50% respiratory variability, suggesting right atrial pressure of 15 mmHg. IAS/Shunts: No atrial level shunt detected by color flow Doppler. Additional Comments: Global hypokinesis with akinesis of the inferolateral wall; overall moderate to severe LV dysfunction; restrictive filling; severe  LVH; moderate LVE; mild AI; mildly dilated ascending aorta; mild MR; severe biatrial enlargement; moderate RV dysfunction.  LEFT VENTRICLE PLAX 2D LVIDd:         5.90 cm      Diastology LVIDs:  5.00 cm      LV e' lateral:   5.35 cm/s LV PW:         1.70 cm      LV E/e' lateral: 18.5 LV IVS:        2.20 cm      LV e' medial:    5.40 cm/s LVOT diam:     2.60 cm      LV E/e' medial:  18.3 LV SV:         78 LV SV Index:   35 LVOT Area:     5.31 cm  LV Volumes (MOD) LV vol d, MOD A2C: 317.0 ml LV vol d, MOD A4C: 272.0 ml LV vol s, MOD A2C: 217.0 ml LV vol s, MOD A4C: 190.0 ml LV SV MOD A2C:     100.0 ml LV SV MOD A4C:     272.0 ml LV SV MOD BP:      92.2 ml RIGHT VENTRICLE            IVC RV Basal diam:  4.00 cm    IVC diam: 2.80 cm RV Mid diam:    3.00 cm RV S prime:     6.81 cm/s TAPSE (M-mode): 1.4 cm LEFT ATRIUM              Index       RIGHT ATRIUM           Index LA diam:        5.40 cm  2.46 cm/m  RA Area:     37.50 cm LA Vol (A2C):   175.0 ml 79.57 ml/m RA Volume:   151.00 ml 68.66 ml/m LA Vol (A4C):   144.0 ml 65.47 ml/m LA Biplane Vol: 162.0 ml 73.66 ml/m  AORTIC VALVE AV Area (Vmax):    4.35 cm AV Area (Vmean):   3.83 cm AV Area (VTI):     4.11 cm AV Vmax:           102.00 cm/s AV Vmean:          77.700 cm/s AV VTI:            0.190 m AV Peak Grad:      4.2 mmHg AV Mean Grad:      3.0 mmHg LVOT Vmax:         83.60 cm/s LVOT Vmean:        56.000 cm/s LVOT VTI:          0.147 m LVOT/AV VTI ratio: 0.77 AI PHT:            748 msec  AORTA Ao Root diam: 4.10 cm Ao Asc diam:  3.80 cm MITRAL VALVE               TRICUSPID VALVE MV Area (PHT): 3.72 cm    TR Peak grad:   41.7 mmHg MV Peak grad:  6.0 mmHg    TR Vmax:        323.00 cm/s MV Mean grad:  2.0 mmHg MV Vmax:       1.22 m/s    SHUNTS MV Vmean:      56.9 cm/s   Systemic VTI:  0.15 m MV Decel Time: 204 msec    Systemic Diam: 2.60 cm MV E velocity: 99.00 cm/s MV A velocity: 21.80 cm/s MV E/A ratio:  4.54 Kirk Ruths MD Electronically signed by  Kirk Ruths MD Signature Date/Time: 10/27/2019/3:00:37 PM    Final  Scheduled Meds: . amLODipine  10 mg Oral Daily  . aspirin EC  81 mg Oral Daily  . carvedilol  12.5 mg Oral BID WC  . cloNIDine  0.2 mg Oral BID  . heparin  5,000 Units Subcutaneous Q8H  . hydrALAZINE  100 mg Oral Q6H  . isosorbide mononitrate  30 mg Oral Daily  . nicotine  14 mg Transdermal Daily  . potassium chloride  20 mEq Oral BID  . scopolamine  1 patch Transdermal Q72H  . sodium chloride flush  3 mL Intravenous Q12H  . sodium chloride flush  3 mL Intravenous Q12H  . spironolactone  12.5 mg Oral Daily  . torsemide  60 mg Oral Daily   Continuous Infusions: . sodium chloride    . nitroGLYCERIN Stopped (10/27/19 1434)     LOS: 3 days     Georgette Shell, MD  Password [facility] 10/28/2019, 3:05 PM

## 2019-10-29 ENCOUNTER — Telehealth (HOSPITAL_COMMUNITY): Payer: Self-pay | Admitting: *Deleted

## 2019-10-29 LAB — BASIC METABOLIC PANEL
Anion gap: 15 (ref 5–15)
BUN: 27 mg/dL — ABNORMAL HIGH (ref 6–20)
CO2: 21 mmol/L — ABNORMAL LOW (ref 22–32)
Calcium: 9.2 mg/dL (ref 8.9–10.3)
Chloride: 100 mmol/L (ref 98–111)
Creatinine, Ser: 2.59 mg/dL — ABNORMAL HIGH (ref 0.61–1.24)
GFR calc Af Amer: 32 mL/min — ABNORMAL LOW (ref >60)
GFR calc non Af Amer: 28 mL/min — ABNORMAL LOW (ref >60)
Glucose, Bld: 172 mg/dL — ABNORMAL HIGH (ref 70–99)
Potassium: 3.4 mmol/L — ABNORMAL LOW (ref 3.5–5.1)
Sodium: 136 mmol/L (ref 135–145)

## 2019-10-29 MED ORDER — TORSEMIDE 20 MG PO TABS: 60.0000 mg | ORAL_TABLET | Freq: Every day | ORAL | 4 refills | Status: DC

## 2019-10-29 MED ORDER — NICOTINE 14 MG/24HR TD PT24: 14.0000 mg | MEDICATED_PATCH | Freq: Every day | TRANSDERMAL | 0 refills | Status: DC

## 2019-10-29 MED ORDER — SPIRONOLACTONE 25 MG PO TABS
25.0000 mg | ORAL_TABLET | Freq: Every day | ORAL | Status: DC
  Administered 2019-10-29: 25 mg via ORAL
  Filled 2019-10-29: qty 1

## 2019-10-29 MED ORDER — CLONIDINE HCL 0.2 MG PO TABS: 0.2000 mg | ORAL_TABLET | Freq: Two times a day (BID) | ORAL | 4 refills | Status: DC

## 2019-10-29 MED ORDER — SPIRONOLACTONE 25 MG PO TABS: 25.0000 mg | ORAL_TABLET | Freq: Every day | ORAL | 4 refills | Status: DC

## 2019-10-29 MED ORDER — HYDRALAZINE HCL 100 MG PO TABS: 100.0000 mg | ORAL_TABLET | Freq: Three times a day (TID) | ORAL | 4 refills | Status: DC

## 2019-10-29 MED ORDER — AMLODIPINE BESYLATE 10 MG PO TABS: 10.0000 mg | ORAL_TABLET | Freq: Every day | ORAL | 4 refills | Status: DC

## 2019-10-29 MED ORDER — SENNOSIDES-DOCUSATE SODIUM 8.6-50 MG PO TABS: 1.0000 | ORAL_TABLET | Freq: Every evening | ORAL | Status: DC | PRN

## 2019-10-29 MED ORDER — POTASSIUM CHLORIDE CRYS ER 20 MEQ PO TBCR: 20.0000 meq | EXTENDED_RELEASE_TABLET | Freq: Every day | ORAL | 4 refills | Status: DC

## 2019-10-29 MED ORDER — CARVEDILOL 12.5 MG PO TABS: 12.5000 mg | ORAL_TABLET | Freq: Two times a day (BID) | ORAL | 4 refills | Status: DC

## 2019-10-29 MED ORDER — ISOSORBIDE MONONITRATE ER 30 MG PO TB24: 30.0000 mg | ORAL_TABLET | Freq: Every day | ORAL | 4 refills | Status: DC

## 2019-10-29 MED FILL — hydrALAZINE HCL 100 MG TABS: 100 | 30 days supply | Qty: 90 | Fill #0

## 2019-10-29 MED FILL — TORSEMIDE 20 MG TABLET: 20 | 30 days supply | Qty: 90 | Fill #0

## 2019-10-29 MED FILL — AMLODIPINE BESYLATE 10 MG T: 10 | 30 days supply | Qty: 30 | Fill #0

## 2019-10-29 MED FILL — CARVEDILOL 12.5 MG TABLET: 12.5 | 30 days supply | Qty: 60 | Fill #0

## 2019-10-29 MED FILL — ISOSORBIDE MN ER 30 MG TAB: 30 | 30 days supply | Qty: 30 | Fill #0

## 2019-10-29 MED FILL — POTASSIUM CHLORIDE 20meqER: 20 | 30 days supply | Qty: 30 | Fill #0

## 2019-10-29 MED FILL — SPIRONOLACTONE 25 MG TABLET: 25 | 30 days supply | Qty: 30 | Fill #0

## 2019-10-29 MED FILL — cloNIDine HCL 0.2 MG TABS: 0.2 | 30 days supply | Qty: 60 | Fill #0

## 2019-10-29 NOTE — Discharge Summary (Signed)
Physician Discharge Summary  Scott Salinas EXN:[id number] DOB: October 16, 1970 DOA: 10/23/2019  PCP: Patient, No Pcp Per  Admit date: 10/23/2019 Discharge date: 10/29/2019  Admitted From: Home Disposition: Home  Recommendations for Outpatient Follow-up:  1. Follow up with PCP/Dr. [name] in 1-2 weeks 2. Please obtain BMP/CBC in one week 3. Please follow up at the lipid clinic  Home Health: None Equipment/Devices: None  Discharge Condition: Stable CODE STATUS: Full code Diet recommendation: Cardiac Brief/Interim Summary:Scott Brownis a 49 y.o.malewith medical history significant forCAD status post CABG, chronic systolic CHF with EF 30 to 35%, accelerated hypertension, and now presenting to the ED for evaluation of chest discomfort and shortness of breath. Patient reports shortness of breath and chest discomfort over the past couple days without cough, fevers, or chills. The chest discomfort began at rest, seems worse with certain movements or laying on his left side, has been fairly constant, and associated with shortness of breath but no nausea or diaphoresis. He had some right foot swelling, pain, and redness a few days ago but that seems to have resolved. He reports adherence with his medications at home, reports that he tries to maintain a low-salt diet, but acknowledges consuming more fluids than recommended.He reports weighing himself daily with fluctuations between 210 and 225 pounds;states that he was closer to 225 last week butnow down closer to 210.  ED Course:Upon arrival to the ED, patient is found to be afebrile, saturating well on room air, normal RR and HR, and BP as high as 192/145. EKG features sinus rhythm with non-specific ST-T abnormalities that are similar to prior and QTc interval of 510 ms. CXR with cardiomegaly but no acute findings. Chemistry panel notable for potassium 3.0 and SCr 2.34. CBC unremarkable. HS troponin was 18, then 15. Patient was treated  with oral hydralazine in ED and then started on nitroglycerin infusion. Hospitalists were asked to admit.    Discharge Diagnoses:  Principal Problem:   Hypertensive crisis Active Problems:   CKD (chronic kidney disease) stage 3, GFR 30-59 ml/min   Prolonged QT interval   Chronic combined systolic and diastolic CHF (congestive heart failure) (HCC)   CAD (coronary artery disease)   Chest pain   Hypokalemia   Hypertensive emergency   Sinus bradycardia   #1 hypertensive urgency-secondary to dietary and medical  noncompliance.  He was started on nitro drip on admission. Blood pressure finally improved with the following regimen. Hydralazine 100 mg every 8 Norvasc 10 mg daily Imdur 30 mg daily Coreg 12.5 mg twice a day Clonidine twice a day 0.2 mg Aldactone 25 mg daily Torsemide 60 mg daily On the day of discharge his creatinine was 2.59 up from 2.23 Potassium was 3.4.  #2 history of CAD/CABG/chronic systolic heart failure ejection fraction 30 to 35% February 2021. Chest x-ray clear.  Echo global hypokinesis with akinesis of the inferolateral wall moderate to severe LV dysfunction 30 to 35% EF. Continue aspirin and Coreg. He is intolerant to statin due to muscle spasm and myopathy. Follow-up with the lipid clinic.  #3 hypokalemia potassium 3.4 repleted normal magnesium  #4 CKD stage IIIb appears to be at baseline, creatinine 2.23.  Creatinine 2.59 on discharge   #5 QT prolongation monitor closely replete lites avoid QT prolonging agents QTC 477 down from 511  #6 sinus brady-beta-blockers were initially on hold but later restarted on Coreg 12.5 twice daily.  Patient was seen in consultation with cardiology.  They are aware that the patient's heart rate drops to 50s which is  okay.    Patient asymptomatic with these episodes.   Estimated body mass index is 33.02 kg/m as calculated from the following:   Height as of this encounter: 5\' 10"  (1.778 m).   Weight as of this  encounter: 104.4 kg.  Discharge Instructions  Discharge Instructions    Avoid straining   Complete by: As directed    Call MD for:  difficulty breathing, headache or visual disturbances   Complete by: As directed    Call MD for:  persistant nausea and vomiting   Complete by: As directed    Call MD for:  temperature >100.4   Complete by: As directed    Diet - low sodium heart healthy   Complete by: As directed    Heart Failure patients record your daily weight using the same scale at the same time of day   Complete by: As directed    Increase activity slowly   Complete by: As directed    STOP any activity that causes chest pain, shortness of breath, dizziness, sweating, or exessive weakness   Complete by: As directed      Allergies as of 10/29/2019      Reactions   Lisinopril Anaphylaxis, Swelling   Whole right side of face became swollen      Medication List    TAKE these medications   acetaminophen 500 MG tablet Commonly known as: TYLENOL Take 1,000 mg by mouth daily as needed for mild pain.   amLODipine 10 MG tablet Commonly known as: NORVASC Take 1 tablet (10 mg total) by mouth daily. Start taking on: October 30, 2019 What changed:   medication strength  how much to take  when to take this   aspirin EC 81 MG tablet Take 81 mg by mouth daily.   carvedilol 12.5 MG tablet Commonly known as: COREG Take 1 tablet (12.5 mg total) by mouth 2 (two) times daily with a meal. What changed:   how much to take  when to take this   cloNIDine 0.2 MG tablet Commonly known as: CATAPRES Take 1 tablet (0.2 mg total) by mouth 2 (two) times daily.   hydrALAZINE 100 MG tablet Commonly known as: APRESOLINE Take 1 tablet (100 mg total) by mouth every 8 (eight) hours. What changed: when to take this   isosorbide mononitrate 30 MG 24 hr tablet Commonly known as: IMDUR Take 1 tablet (30 mg total) by mouth daily. Start taking on: October 30, 2019   nicotine 14 mg/24hr  patch Commonly known as: NICODERM CQ - dosed in mg/24 hours Place 1 patch (14 mg total) onto the skin daily. Start taking on: October 30, 2019   potassium chloride SA 20 MEQ tablet Commonly known as: KLOR-CON Take 1 tablet (20 mEq total) by mouth daily. What changed:   See the new instructions.  Another medication with the same name was removed. Continue taking this medication, and follow the directions you see here.   senna-docusate 8.6-50 MG tablet Commonly known as: Senokot-S Take 1 tablet by mouth at bedtime as needed for mild constipation.   spironolactone 25 MG tablet Commonly known as: ALDACTONE Take 1 tablet (25 mg total) by mouth daily. Start taking on: October 30, 2019   torsemide 20 MG tablet Commonly known as: DEMADEX Take 3 tablets (60 mg total) by mouth daily. Start taking on: October 30, 2019 What changed: additional instructions       Follow-up Information    Larey Dresser, MD Follow up.   Specialty:  Cardiology Contact information: [place] [address] [phone]              Allergies  Allergen Reactions  . Lisinopril Anaphylaxis and Swelling    Whole right side of face became swollen    Consultations:  Cardiology   Procedures/Studies: DG Chest 1 View  Result Date: 10/25/2019 CLINICAL DATA:  Chest pain. EXAM: CHEST  1 VIEW COMPARISON:  October 23, 2019 FINDINGS: Multiple sternal wires and vascular clips are seen. There is no evidence of acute infiltrate, pleural effusion or pneumothorax. The heart size and mediastinal contours are within normal limits. There is tortuosity of the descending thoracic aorta. The visualized skeletal structures are unremarkable. IMPRESSION: 1. Evidence of prior median sternotomy/CABG. 2. No acute or active cardiopulmonary disease. Electronically Signed   By: Virgina Norfolk M.D.   On: 10/25/2019 19:03   DG Chest 2 View  Result Date: 10/23/2019 CLINICAL DATA:  Chest pain EXAM: CHEST - 2 VIEW  COMPARISON:  02/08/2018 FINDINGS: Prior CABG. Cardiomegaly. Lungs are clear. No effusions. No acute bony abnormality. IMPRESSION: Cardiomegaly.  No active disease. Electronically Signed   By: Rolm Baptise M.D.   On: 10/23/2019 01:29   CT CHEST WO CONTRAST  Result Date: 10/25/2019 CLINICAL DATA:  Hypoxemia EXAM: CT CHEST WITHOUT CONTRAST TECHNIQUE: Multidetector CT imaging of the chest was performed following the standard protocol without IV contrast. COMPARISON:  02/09/2018 and 01/12/2015 CT, 10/25/2019 x-ray FINDINGS: Cardiovascular: Median sternotomy and prior CABG. Heart size is mildly enlarged. No pericardial effusion. Ascending thoracic aorta measures 4.1 cm at the level of the right main pulmonary artery, stable from 2019. Scattered atherosclerotic calcification of the aorta and native coronary arteries. Main pulmonary trunk is not dilated. Mediastinum/Nodes: Mildly enlarged precarinal lymph node measuring 12 mm short axis, stable from 2019. No axillary lymphadenopathy. No enlarged hilar lymph nodes, although evaluation is somewhat limited in the absence of intravenous contrast. Unremarkable thyroid, trachea, and esophagus. Lungs/Pleura: Stable perifissural 3-4 mm right middle lobe pulmonary nodule (series 4, image 84), benign. Minimal right basilar subsegmental atelectasis. The lungs are otherwise clear. No focal airspace consolidation. No pleural effusion or pneumothorax. Upper Abdomen: No acute findings within the visualized upper abdomen. Musculoskeletal: No chest wall mass or suspicious bone lesions identified. IMPRESSION: 1. No acute findings within the chest. 2. 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 Recommend annual imaging followup by CTA or MRA. This recommendation follows 2010 ACCF/AHA/AATS/ACR/ASA/SCA/SCAI/SIR/STS/SVM Guidelines for the Diagnosis and Management of Patients with Thoracic Aortic Disease. Circulation. 2010; 121: D622-W979. Aortic  aneurysm NOS (ICD10-I71.9) 3. Nonspecific mildly enlarged precarinal lymph node measuring up to 12 mm short axis, stable from 2019. 4. Aortic atherosclerosis. (ICD10-I70.0). Electronically Signed   By: Davina Poke D.O.   On: 10/25/2019 19:58   DG Foot 2 Views Right  Result Date: 10/24/2019 CLINICAL DATA:  Right foot pain and swelling. EXAM: RIGHT FOOT - 2 VIEW COMPARISON:  Remote radiograph 10/30/2004 FINDINGS: Hallux valgus with mild degenerative change at the first metatarsal phalangeal joint. Otherwise normal alignment. No fracture, erosion, periosteal reaction or bony destruction. Mild generalized soft tissue edema. IMPRESSION: 1. Generalized soft tissue edema. 2. Hallux valgus with mild degenerative change at the first metatarsophalangeal joint. Electronically Signed   By: Keith Rake M.D.   On: 10/24/2019 19:06   ECHOCARDIOGRAM COMPLETE  Result Date: 10/27/2019    ECHOCARDIOGRAM REPORT   Patient Name:   Scott Salinas Date of Exam: 10/27/2019 Medical Rec #:  [id number]         Height:       70.0 in Accession #:    [id number]        Weight:       226.2 lb Date of Birth:  08/13/1970         BSA:          2.199 m Patient Age:    49 years          BP:           135/97 mmHg Patient Gender: M                 HR:           75 bpm. Exam Location:  Inpatient Procedure: 2D Echo, Cardiac Doppler and Color Doppler Indications:    CHF-Acute Systolic  History:        Patient has prior history of Echocardiogram examinations, most                 recent 06/09/2019. CHF, CAD, Prior CABG; Risk Factors:Dyslipidemia                 and Hypertension. CKD. Polysubstance abuse.  Sonographer:    Clayton Lefort RDCS (AE) Referring Phys: [id number] Simpson  1. Global hypokinesis with akinesis of the inferolateral wall; overall moderate to severe LV dysfunction; restrictive filling; severe LVH; moderate LVE; mild AI; mildly dilated ascending aorta; mild MR; severe biatrial enlargement; moderate RV  dysfunction.  2. Left ventricular ejection fraction, by estimation, is 30 to 35%. The left ventricle has moderate to severely decreased function. The left ventricle demonstrates regional wall motion abnormalities (see scoring diagram/findings for description). The left ventricular internal cavity size was moderately dilated. There is severe left ventricular hypertrophy. Left ventricular diastolic parameters are consistent with Grade III diastolic dysfunction (restrictive). Elevated left atrial pressure.  3. Right ventricular systolic function is moderately reduced. The right ventricular size is normal. There is moderately elevated pulmonary artery systolic pressure.  4. Left atrial size was severely dilated.  5. Right atrial size was severely dilated.  6. The mitral valve is normal in structure. Mild mitral valve regurgitation. No evidence of mitral stenosis.  7. The aortic valve is tricuspid. Aortic valve regurgitation is mild. Mild aortic valve sclerosis is present, with no evidence of aortic valve stenosis.  8. Aortic dilatation noted. There is mild dilatation of the aortic root measuring 40 mm.  9. The inferior vena cava is dilated in size with <50% respiratory variability, suggesting right atrial pressure of 15 mmHg. FINDINGS  Left Ventricle: Left ventricular ejection fraction, by estimation, is 30 to 35%. The left ventricle has moderate to severely decreased function. The left ventricle demonstrates regional wall motion abnormalities. The left ventricular internal cavity size was moderately dilated. There is severe left ventricular hypertrophy. Left ventricular diastolic parameters are consistent with Grade III diastolic dysfunction (restrictive). Elevated left atrial pressure. Right Ventricle: The right ventricular size is normal.Right ventricular systolic function is moderately reduced. There is moderately elevated pulmonary artery systolic pressure. The tricuspid regurgitant velocity is 3.23 m/s, and with  an assumed right atrial pressure of 15 mmHg, the estimated right ventricular systolic pressure is 62.8 mmHg. Left Atrium: Left atrial size was severely dilated. Right Atrium: Right atrial size was severely dilated. Pericardium: There is no evidence of pericardial effusion. Mitral Valve: The mitral valve is normal in structure. Normal mobility of the mitral valve leaflets. Mild mitral valve regurgitation. No evidence  of mitral valve stenosis. MV peak gradient, 6.0 mmHg. The mean mitral valve gradient is 2.0 mmHg. Tricuspid Valve: The tricuspid valve is normal in structure. Tricuspid valve regurgitation is mild . No evidence of tricuspid stenosis. Aortic Valve: The aortic valve is tricuspid. Aortic valve regurgitation is mild. Aortic regurgitation PHT measures 748 msec. Mild aortic valve sclerosis is present, with no evidence of aortic valve stenosis. Aortic valve mean gradient measures 3.0 mmHg. Aortic valve peak gradient measures 4.2 mmHg. Aortic valve area, by VTI measures 4.11 cm. Pulmonic Valve: The pulmonic valve was normal in structure. Pulmonic valve regurgitation is trivial. No evidence of pulmonic stenosis. Aorta: Aortic dilatation noted. There is mild dilatation of the aortic root measuring 40 mm. Venous: The inferior vena cava is dilated in size with less than 50% respiratory variability, suggesting right atrial pressure of 15 mmHg. IAS/Shunts: No atrial level shunt detected by color flow Doppler. Additional Comments: Global hypokinesis with akinesis of the inferolateral wall; overall moderate to severe LV dysfunction; restrictive filling; severe LVH; moderate LVE; mild AI; mildly dilated ascending aorta; mild MR; severe biatrial enlargement; moderate RV dysfunction.  LEFT VENTRICLE PLAX 2D LVIDd:         5.90 cm      Diastology LVIDs:         5.00 cm      LV e' lateral:   5.35 cm/s LV PW:         1.70 cm      LV E/e' lateral: 18.5 LV IVS:        2.20 cm      LV e' medial:    5.40 cm/s LVOT diam:     2.60  cm      LV E/e' medial:  18.3 LV SV:         78 LV SV Index:   35 LVOT Area:     5.31 cm  LV Volumes (MOD) LV vol d, MOD A2C: 317.0 ml LV vol d, MOD A4C: 272.0 ml LV vol s, MOD A2C: 217.0 ml LV vol s, MOD A4C: 190.0 ml LV SV MOD A2C:     100.0 ml LV SV MOD A4C:     272.0 ml LV SV MOD BP:      92.2 ml RIGHT VENTRICLE            IVC RV Basal diam:  4.00 cm    IVC diam: 2.80 cm RV Mid diam:    3.00 cm RV S prime:     6.81 cm/s TAPSE (M-mode): 1.4 cm LEFT ATRIUM              Index       RIGHT ATRIUM           Index LA diam:        5.40 cm  2.46 cm/m  RA Area:     37.50 cm LA Vol (A2C):   175.0 ml 79.57 ml/m RA Volume:   151.00 ml 68.66 ml/m LA Vol (A4C):   144.0 ml 65.47 ml/m LA Biplane Vol: 162.0 ml 73.66 ml/m  AORTIC VALVE AV Area (Vmax):    4.35 cm AV Area (Vmean):   3.83 cm AV Area (VTI):     4.11 cm AV Vmax:           102.00 cm/s AV Vmean:          77.700 cm/s AV VTI:            0.190 m AV Peak Grad:  4.2 mmHg AV Mean Grad:      3.0 mmHg LVOT Vmax:         83.60 cm/s LVOT Vmean:        56.000 cm/s LVOT VTI:          0.147 m LVOT/AV VTI ratio: 0.77 AI PHT:            748 msec  AORTA Ao Root diam: 4.10 cm Ao Asc diam:  3.80 cm MITRAL VALVE               TRICUSPID VALVE MV Area (PHT): 3.72 cm    TR Peak grad:   41.7 mmHg MV Peak grad:  6.0 mmHg    TR Vmax:        323.00 cm/s MV Mean grad:  2.0 mmHg MV Vmax:       1.22 m/s    SHUNTS MV Vmean:      56.9 cm/s   Systemic VTI:  0.15 m MV Decel Time: 204 msec    Systemic Diam: 2.60 cm MV E velocity: 99.00 cm/s MV A velocity: 21.80 cm/s MV E/A ratio:  4.54 Kirk Ruths MD Electronically signed by Kirk Ruths MD Signature Date/Time: 10/27/2019/3:00:37 PM    Final    VAS [place] LOWER EXTREMITY VENOUS (DVT)  Result Date: 10/25/2019  Lower Venous DVTStudy Indications: Edema.  Comparison Study: Prior study from 12/29/15 is available for comparison Performing Technologist: Sharion Dove RVS  Examination Guidelines: A complete evaluation includes B-mode imaging,  spectral Doppler, color Doppler, and power Doppler as needed of all accessible portions of each vessel. Bilateral testing is considered an integral part of a complete examination. Limited examinations for reoccurring indications may be performed as noted. The reflux portion of the exam is performed with the patient in reverse Trendelenburg.  +---------+---------------+---------+-----------+----------+--------------+ RIGHT    CompressibilityPhasicitySpontaneityPropertiesThrombus Aging +---------+---------------+---------+-----------+----------+--------------+ CFV      Full           Yes      Yes                                 +---------+---------------+---------+-----------+----------+--------------+ SFJ      Full                                                        +---------+---------------+---------+-----------+----------+--------------+ FV Prox  Full                                                        +---------+---------------+---------+-----------+----------+--------------+ FV Mid   Full                                                        +---------+---------------+---------+-----------+----------+--------------+ FV DistalFull                                                        +---------+---------------+---------+-----------+----------+--------------+  PFV      Full                                                        +---------+---------------+---------+-----------+----------+--------------+ POP      Full           Yes      Yes                                 +---------+---------------+---------+-----------+----------+--------------+ PTV      Full                                                        +---------+---------------+---------+-----------+----------+--------------+ PERO     Full                                                        +---------+---------------+---------+-----------+----------+--------------+    +----+---------------+---------+-----------+----------+--------------+ LEFTCompressibilityPhasicitySpontaneityPropertiesThrombus Aging +----+---------------+---------+-----------+----------+--------------+ CFV Full           Yes      Yes                                 +----+---------------+---------+-----------+----------+--------------+     Summary: RIGHT: - Findings appear essentially unchanged compared to previous examination. - There is no evidence of deep vein thrombosis in the lower extremity.  - Ultrasound characteristics of enlarged lymph nodes are noted in the groin.  LEFT: - No evidence of common femoral vein obstruction.  *See table(s) above for measurements and observations. Electronically signed by Servando Snare MD on 10/25/2019 at 11:15:47 AM.    Final     (Echo, Carotid, EGD, Colonoscopy, ERCP)    Subjective: Patient is sitting up in chair anxious to discharge and go home today No new complaints feels well  Discharge Exam: Vitals:   10/29/19 0342 10/29/19 0834  BP: (!) 140/107 (!) 149/101  Pulse: 77   Resp: 19   Temp: 97.9 F (36.6 C)   SpO2: 96%    Vitals:   10/28/19 1954 10/28/19 2354 10/29/19 0342 10/29/19 0834  BP: (!) 151/109 (!) 126/97 (!) 140/107 (!) 149/101  Pulse: 81 73 77   Resp: 15 20 19    Temp: 97.8 F (36.6 C) 98.3 F (36.8 C) 97.9 F (36.6 C)   TempSrc: Oral Oral Oral   SpO2: 96% 100% 96%   Weight:   104.4 kg   Height:        General: Pt is alert, awake, not in acute distress Cardiovascular: RRR, S1/S2 +, no rubs, no gallops Respiratory: CTA bilaterally, no wheezing, no rhonchi Abdominal: Soft, NT, ND, bowel sounds + Extremities: no edema, no cyanosis    The results of significant diagnostics from this hospitalization (including imaging, microbiology, ancillary and laboratory) are listed below for reference.     Microbiology: Recent Results (from the past 240 hour(s))  SARS Coronavirus 2 by RT PCR (hospital  order, performed in  [facility] hospital lab) Nasopharyngeal Nasopharyngeal Swab     Status: None   Collection Time: 10/23/19  4:16 AM   Specimen: Nasopharyngeal Swab  Result Value Ref Range Status   SARS Coronavirus 2 NEGATIVE NEGATIVE Final    Comment: (NOTE) SARS-CoV-2 target nucleic acids are NOT DETECTED.  The SARS-CoV-2 RNA is generally detectable in upper and lower respiratory specimens during the acute phase of infection. The lowest concentration of SARS-CoV-2 viral copies this assay can detect is 250 copies / mL. A negative result does not preclude SARS-CoV-2 infection and should not be used as the sole basis for treatment or other patient management decisions.  A negative result may occur with improper specimen collection / handling, submission of specimen other than nasopharyngeal swab, presence of viral mutation(s) within the areas targeted by this assay, and inadequate number of viral copies (<250 copies / mL). A negative result must be combined with clinical observations, patient history, and epidemiological information.  Fact Sheet for Patients:   StrictlyIdeas.no  Fact Sheet for Healthcare Providers: BankingDealers.co.za  This test is not yet approved or  cleared by the [place] FDA and has been authorized for detection and/or diagnosis of SARS-CoV-2 by FDA under an Emergency Use Authorization (EUA).  This EUA will remain in effect (meaning this test can be used) for the duration of the COVID-19 declaration under Section 564(b)(1) of the Act, 21 U.S.C. section 360bbb-3(b)(1), unless the authorization is terminated or revoked sooner.  Performed at [facility], [place] [address] [place], [place] [address]      Labs: BNP (last 3 results) No results for input(s): BNP in the last 8760 hours. Basic Metabolic Panel: Recent Labs  Lab 10/23/19 0050 10/23/19 0259 10/24/19 0434 10/24/19 0434 10/25/19 0422 10/26/19 0928  10/27/19 1224 10/28/19 0422 10/28/19 1522 10/29/19 0732  NA   < >  --  136   < > 137 135 134* 135  --  136  K   < >  --  3.4*   < > 3.3* 3.5 3.6 3.5  --  3.4*  CL   < >  --  101   < > 102 101 102 103  --  100  CO2   < >  --  24   < > 24 24 24 23   --  21*  GLUCOSE   < >  --  121*   < > 92 132* 79 119*  --  172*  BUN   < >  --  22*   < > 28* 23* 21* 22*  --  27*  CREATININE   < >  --  2.31*   < > 2.63* 2.40* 2.31* 2.23*  --  2.59*  CALCIUM   < >  --  9.2   < > 8.8* 9.0 8.8* 8.6*  --  9.2  MG  --  2.0 2.1  --   --   --   --   --  2.3  --    < > = values in this interval not displayed.   Liver Function Tests: No results for input(s): AST, ALT, ALKPHOS, BILITOT, PROT, ALBUMIN in the last 168 hours. No results for input(s): LIPASE, AMYLASE in the last 168 hours. No results for input(s): AMMONIA in the last 168 hours. CBC: Recent Labs  Lab 10/23/19 0050 10/24/19 0434 10/25/19 0422 10/26/19 0928  WBC 6.6 6.0 6.9 5.5  HGB 16.1 15.5 15.5 14.9  HCT 49.0 45.7  45.1 44.3  MCV 94.0 91.6 91.3 91.7  PLT 232 223 231 214   Cardiac Enzymes: No results for input(s): CKTOTAL, CKMB, CKMBINDEX, TROPONINI in the last 168 hours. BNP: Invalid input(s): POCBNP CBG: No results for input(s): GLUCAP in the last 168 hours. D-Dimer No results for input(s): DDIMER in the last 72 hours. Hgb A1c No results for input(s): HGBA1C in the last 72 hours. Lipid Profile No results for input(s): CHOL, HDL, LDLCALC, TRIG, CHOLHDL, LDLDIRECT in the last 72 hours. Thyroid function studies No results for input(s): TSH, T4TOTAL, T3FREE, THYROIDAB in the last 72 hours.  Invalid input(s): FREET3 Anemia work up No results for input(s): VITAMINB12, FOLATE, FERRITIN, TIBC, IRON, RETICCTPCT in the last 72 hours. Urinalysis    Component Value Date/Time   COLORURINE YELLOW 01/19/2015 0134   APPEARANCEUR CLEAR 01/19/2015 0134   LABSPEC 1.006 01/19/2015 0134   PHURINE 5.5 01/19/2015 0134   GLUCOSEU NEGATIVE 01/19/2015  0134   HGBUR NEGATIVE 01/19/2015 0134   BILIRUBINUR NEGATIVE 01/19/2015 0134   KETONESUR NEGATIVE 01/19/2015 0134   PROTEINUR NEGATIVE 01/19/2015 0134   UROBILINOGEN 0.2 01/19/2015 0134   NITRITE NEGATIVE 01/19/2015 0134   LEUKOCYTESUR NEGATIVE 01/19/2015 0134   Sepsis Labs Invalid input(s): PROCALCITONIN,  WBC,  LACTICIDVEN Microbiology Recent Results (from the past 240 hour(s))  SARS Coronavirus 2 by RT PCR (hospital order, performed in [place] hospital lab) Nasopharyngeal Nasopharyngeal Swab     Status: None   Collection Time: 10/23/19  4:16 AM   Specimen: Nasopharyngeal Swab  Result Value Ref Range Status   SARS Coronavirus 2 NEGATIVE NEGATIVE Final    Comment: (NOTE) SARS-CoV-2 target nucleic acids are NOT DETECTED.  The SARS-CoV-2 RNA is generally detectable in upper and lower respiratory specimens during the acute phase of infection. The lowest concentration of SARS-CoV-2 viral copies this assay can detect is 250 copies / mL. A negative result does not preclude SARS-CoV-2 infection and should not be used as the sole basis for treatment or other patient management decisions.  A negative result may occur with improper specimen collection / handling, submission of specimen other than nasopharyngeal swab, presence of viral mutation(s) within the areas targeted by this assay, and inadequate number of viral copies (<250 copies / mL). A negative result must be combined with clinical observations, patient history, and epidemiological information.  Fact Sheet for Patients:   StrictlyIdeas.no  Fact Sheet for Healthcare Providers: BankingDealers.co.za  This test is not yet approved or  cleared by the [place] FDA and has been authorized for detection and/or diagnosis of SARS-CoV-2 by FDA under an Emergency Use Authorization (EUA).  This EUA will remain in effect (meaning this test can be used) for the duration of  the COVID-19 declaration under Section 564(b)(1) of the Act, 21 U.S.C. section 360bbb-3(b)(1), unless the authorization is terminated or revoked sooner.  Performed at [facility], [place] [address] [place], [place] [address]      Time coordinating discharge: 39 minutes  SIGNED:   Georgette Shell, MD  [facility] 10/29/2019, 10:06 AM  If 7PM-7AM, please contact night-coverage [email or web address] Password [email or web address]

## 2019-10-29 NOTE — Progress Notes (Addendum)
Patient ID: Scott Salinas, male   DOB: 18-Apr-1971, 49 y.o.   MRN: [id number]     Advanced Heart Failure Rounding Note  PCP-Cardiologist: Loralie Champagne, MD   Subjective:    No complaints this morning.  Wants to go home.  Last BP 143/97.  Pending labs.  No dyspnea.    Objective:   Weight Range: 104.4 kg Body mass index is 33.02 kg/m.   Vital Signs:   Temp:  [97.8 F (36.6 C)-98.3 F (36.8 C)] 97.9 F (36.6 C) (06/23 0342) Pulse Rate:  [73-81] 77 (06/23 0342) Resp:  [15-20] 19 (06/23 0342) BP: (126-151)/(97-109) 140/107 (06/23 0342) SpO2:  [96 %-100 %] 96 % (06/23 0342) Weight:  [104.4 kg] 104.4 kg (06/23 0342) Last BM Date: 10/26/19  Weight change: Filed Weights   10/27/19 0612 10/28/19 0400 10/29/19 0342  Weight: 102.6 kg 102 kg 104.4 kg    Intake/Output:   Intake/Output Summary (Last 24 hours) at 10/29/2019 0750 Last data filed at 10/29/2019 0535 Gross per 24 hour  Intake 1080 ml  Output 4075 ml  Net -2995 ml      Physical Exam    General:  Well appearing. No resp difficulty HEENT: Normal Neck: Supple. JVP not elevated. Carotids 2+ bilat; no bruits. No lymphadenopathy or thyromegaly appreciated. Cor: PMI nondisplaced. Regular rate & rhythm. No rubs, gallops or murmurs. Lungs: Clear Abdomen: Soft, nontender, nondistended. No hepatosplenomegaly. No bruits or masses. Good bowel sounds. Extremities: No cyanosis, clubbing, rash, edema Neuro: Alert & orientedx3, cranial nerves grossly intact. moves all 4 extremities w/o difficulty. Affect pleasant   Telemetry   NSR 70s (personally reviewed)  Labs    CBC Recent Labs    10/26/19 0928  WBC 5.5  HGB 14.9  HCT 44.3  MCV 91.7  PLT 400   Basic Metabolic Panel Recent Labs    10/27/19 1224 10/28/19 0422 10/28/19 1522  NA 134* 135  --   K 3.6 3.5  --   CL 102 103  --   CO2 24 23  --   GLUCOSE 79 119*  --   BUN 21* 22*  --   CREATININE 2.31* 2.23*  --   CALCIUM 8.8* 8.6*  --   MG  --   --  2.3     Liver Function Tests No results for input(s): AST, ALT, ALKPHOS, BILITOT, PROT, ALBUMIN in the last 72 hours. No results for input(s): LIPASE, AMYLASE in the last 72 hours. Cardiac Enzymes No results for input(s): CKTOTAL, CKMB, CKMBINDEX, TROPONINI in the last 72 hours.  BNP: BNP (last 3 results) No results for input(s): BNP in the last 8760 hours.  ProBNP (last 3 results) No results for input(s): PROBNP in the last 8760 hours.   D-Dimer No results for input(s): DDIMER in the last 72 hours. Hemoglobin A1C No results for input(s): HGBA1C in the last 72 hours. Fasting Lipid Panel No results for input(s): CHOL, HDL, LDLCALC, TRIG, CHOLHDL, LDLDIRECT in the last 72 hours. Thyroid Function Tests No results for input(s): TSH, T4TOTAL, T3FREE, THYROIDAB in the last 72 hours.  Invalid input(s): FREET3  Other results:   Imaging     No results found.   Medications:     Scheduled Medications: . amLODipine  10 mg Oral Daily  . aspirin EC  81 mg Oral Daily  . carvedilol  12.5 mg Oral BID WC  . cloNIDine  0.2 mg Oral BID  . heparin  5,000 Units Subcutaneous Q8H  . hydrALAZINE  100 mg Oral  Q8H  . isosorbide mononitrate  30 mg Oral Daily  . nicotine  14 mg Transdermal Daily  . potassium chloride  20 mEq Oral BID  . sodium chloride flush  3 mL Intravenous Q12H  . sodium chloride flush  3 mL Intravenous Q12H  . spironolactone  25 mg Oral Daily  . torsemide  40 mg Oral QPM  . torsemide  60 mg Oral Daily     Infusions: . sodium chloride    . nitroGLYCERIN Stopped (10/27/19 1434)     PRN Medications:  sodium chloride, acetaminophen **OR** acetaminophen, HYDROcodone-acetaminophen, HYDROmorphone (DILAUDID) injection, promethazine, senna-docusate, sodium chloride flush   Assessment/Plan   1. Acute on chronic systolic CHF: Ischemic cardiomyopathy. Echo this admission with EF 30-35%, severe LVH/moderate LV enlargement, moderate RV dysfunction.  He has diuresed well,  volume status looks ok. - Continue Coreg 12.5 mg bid, occasional brady to 68s but ok.  - Continue hydralazine 100 mg tid and tolerating Imdur 30 daily (would not increase with history of HA).  - If K stable today, increase spironolactone to 25 mg daily.  - No Entresto/ACEI with ACEI angioedema.  - Continue torsemide 60 qam/40 qpm (he was taking this at home).  2. Hypertensive emergency: With CHF.  BP gradually improving.  This morning in the 140s/90s range.  - Increase spironolactone to 25 mg daily if K stable.  - Continue amlodipine, clonidine, hydralazine, Coreg.  - With OSA, CPAP would help but has had financial issues getting this.  3. CAD: s/p CABG 9/16.  Cannot take statins with rhabdomyolysis.  Was on Repatha in the past, needs to go back to lipid clinic after discharge.  4. Thoracic aortic aneurysm: 4.1 cm ascending aorta on CT this admission, likely from HTN.  5. CKD: Stage 3.  Creatinine has been at baseline, pending labs today.  6. Disposition: Think he can go home today.  Will need followup in CHF clinic and lipid clinic.  Cardiac meds for home: ASA 81, Coreg 12.5 mg bid, clonidine 0.2 bid, hydralazine 100 mg tid, Imdur 30 daily, spironolactone 25 daily, torsemide 60 qam/40 qpm, amlodipine 10 daily, KCl 20 daily.   Length of Stay: [place], MD  10/29/2019, 7:50 AM  Advanced Heart Failure Team Pager [phone] (M-F; 7a - 4p)  Please contact [facility] for night-coverage after hours (4p -7a ) and weekends on [email or web address]  Creatinine mildly higher today, would send home on torsemide 60 mg once daily.   Loralie Champagne 10/29/2019 10:01 AM

## 2019-10-29 NOTE — Telephone Encounter (Signed)
Left VM for pt to call to schedule hosp. F/u.

## 2019-11-05 NOTE — Progress Notes (Signed)
Cardiology: Dr. [name]  HPI:  Scott Salinas is a 49 y.o. male with history of CAD, ischemic CMP, and CKD stage 3.   Patient is s/p CABG in 9/16.  Most recent cath in 8/17 showed occluded of seq SVG-D1/D2.  Echo in 1/18 showed EF 40-45%, but echo in 10/18 showed EF down to 20-25%.  Atorvastatin was stopped in 2019 because CK > 2500. He was referred to lipid clinic for PCSK9i, but unable to afford due to high cost.   Admitted 10/4 - 02/10/18 with shoulder pain, noted to have hypertensive urgency and mild troponin elevation (peak 0.12). Cardiology followed and adjusted HTN meds. Not thought to be ischemic.   Echo 02/09/18 LVEF 20-25%, Diffuse HK, Grade 3 DD, Trivial AI, Mod/Sev LAE, RV mildly reduced and dilate, severe RAE, Mod TR, PA peak pressure 60 mm Hg  Echo 2/21: LVEF 30-35%. RV ok.   Recently presented to HF Clinic for follow up with Lyda Jester, PA-C on 10/16/19.  Reported doing well from a symptom/volume standpoint. No dyspnea at rest or with exertion. Denieed CP. No LEE, orthopnea/ PND. BP elevated. 166/122. He took hydralazine and torsemide before his clinic visit but waited to take his 1st carvedilol dose around noon time. Only taking hydralazine BID. Unable to get tid dosing in.   Recent admission on 10/23/19 for chest discomfort and shortness of breath. Reported taking all medications as prescribed but noted he was drinking more fluids. He was diuresed and his medications were adjusted. He was instructed to follow up with HF Clinic and Lipid Clinic at discharge on 10/29/19.   Today he returns to HF clinic for pharmacist medication titration. Overall he is feeling well today. His only complaint is drowsiness. No dizziness, lightheadedness, chest pain or palpitations. No SOB/DOE. His weight at home has been stable. He takes his torsemide 20 mg TID instead of 60 mg daily so that he remembers to take his afternoon and evening medications. He is urinating well on that regimen. No  LEE, PND or orthopnea. He does not fluid restrict and drinks water/tea throughout the day.   HF Medications: -Carvedilol 12.5 mg BID -Spironolactone 25 mg daily -Hydralazine 100 mg TID -Isosorbide mononitrate 30 mg daily -Torsemide 60 mg daily -Potassium Chloride 20 mEq daily  HTN Medications: -Carvedilol 12.5 mg BID -Spironolactone 25 mg daily -Hydralazine 100 mg TID -Isosorbide mononitrate 30 mg daily -Clonidine 0.2 mg BID -Amlodipine 10 mg daily  Has the patient been experiencing any side effects to the medications prescribed?  no  Does the patient have any problems obtaining medications due to transportation or finances?   Yes - no prescription in surance  Understanding of regimen: good Understanding of indications: good Potential of compliance: good Patient understands to avoid NSAIDs. Patient understands to avoid decongestants.    Pertinent Lab Values: . Serum creatinine 2.12, BUN 21, Potassium 3.8, Sodium 133  Vital Signs: . Weight: 227.8 lbs (last clinic weight: 230.16 lbs) . Blood pressure: 118/74  . Heart rate: 59   Assessment: 1. CAD: S/p CABG 9/16.  Seq SVG-D1/D2 known to be occluded.  - stable w/o anginal symptoms.  - Continue ASA + ? blocker - In the past he was on atorvastatin but this was stopped due to elevated CK >2500.  -Referred to lipid clinic for [place] but unable to afford due to high cost.   -Referred back to lipid clinic after recent hospitalization on 10/29/19.  2. HTN:  - Controlled today at 118/74 - Continue Amlodipine 10 mg daily   -  Continue hydralazine 100 mg TID - Continue isosorbide mononitrate 30 mg daily - Continue clonidine 0.2 mg BID - Continue spironolactone 25 mg daily - Continue torsemide 60 mg daily   - untreated OSA may be playing a role. He refuses CPAP  3. CKD: Stage 3   - Last SCr 2.59 on discharge, Scr improved to 2.12 today  4. Chronic systolic CHF: Ischemic cardiomyopathy, EF 20-25% on 10/18 echo. Echo  02/09/18 LVEF 20-25%, Diffuse HK, Grade 3 DD, Trivial AI, Mod/Sev LAE, RV mildly reduced and dilate, severe RAE, Mod TR, PA peak pressure 60 mm Hg - ECHO 06/09/19 EF 30-35%. RV ok  - NYHA II. Volume status stable. Euvolemic - Continue torsemide 60 mg daily (reported taking 20 mg TID) and potassium chloride 20 mEq daily - Angioedema with ACEI, so no ACEI or Entresto.   - Continue carvedilol 12.5  mg BID - Continue spironolactone 25 mg daily - Continue hydralazine 100 mg TID  - Continue isosorbide mononitrate 30 mg daily. Would not uptitrate based on headaches with this medication previously.   5. OSA - refuses CPAP    Plan: 1) Medication changes: Based on clinical presentation, vital signs and recent labs will make no medication changes today as patient's BP is controlled.  2) Labs: Scr 2.12, K 3.8 3) Follow-up: 2 months with Dr. Rush Farmer, PharmD, BCPS, [facility], [place] [facility] Pharmacist [phone]

## 2019-11-18 ENCOUNTER — Other Ambulatory Visit: Payer: Self-pay

## 2019-11-18 ENCOUNTER — Ambulatory Visit (HOSPITAL_COMMUNITY)
Admission: RE | Admit: 2019-11-18 | Discharge: 2019-11-18 | Disposition: A | Discharge status: Home / Self Care | Payer: Medicaid Other | Source: Ambulatory Visit | Attending: Cardiology | Admitting: Cardiology

## 2019-11-18 VITALS — BP 118/74 | HR 59 | Wt 227.8 lb

## 2019-11-18 DIAGNOSIS — N183 Chronic kidney disease, stage 3 unspecified: Secondary | ICD-10-CM | POA: Insufficient documentation

## 2019-11-18 DIAGNOSIS — I130 Hypertensive heart and chronic kidney disease with heart failure and stage 1 through stage 4 chronic kidney disease, or unspecified chronic kidney disease: Secondary | ICD-10-CM | POA: Diagnosis not present

## 2019-11-18 DIAGNOSIS — Z951 Presence of aortocoronary bypass graft: Secondary | ICD-10-CM | POA: Insufficient documentation

## 2019-11-18 DIAGNOSIS — I5022 Chronic systolic (congestive) heart failure: Secondary | ICD-10-CM | POA: Insufficient documentation

## 2019-11-18 DIAGNOSIS — I5042 Chronic combined systolic (congestive) and diastolic (congestive) heart failure: Secondary | ICD-10-CM

## 2019-11-18 DIAGNOSIS — I255 Ischemic cardiomyopathy: Secondary | ICD-10-CM | POA: Diagnosis not present

## 2019-11-18 DIAGNOSIS — G4733 Obstructive sleep apnea (adult) (pediatric): Secondary | ICD-10-CM | POA: Diagnosis not present

## 2019-11-18 DIAGNOSIS — I2510 Atherosclerotic heart disease of native coronary artery without angina pectoris: Secondary | ICD-10-CM | POA: Diagnosis present

## 2019-11-18 LAB — BASIC METABOLIC PANEL
Anion gap: 11 (ref 5–15)
BUN: 21 mg/dL — ABNORMAL HIGH (ref 6–20)
CO2: 21 mmol/L — ABNORMAL LOW (ref 22–32)
Calcium: 9.1 mg/dL (ref 8.9–10.3)
Chloride: 101 mmol/L (ref 98–111)
Creatinine, Ser: 2.12 mg/dL — ABNORMAL HIGH (ref 0.61–1.24)
GFR calc Af Amer: 41 mL/min — ABNORMAL LOW (ref >60)
GFR calc non Af Amer: 35 mL/min — ABNORMAL LOW (ref >60)
Glucose, Bld: 102 mg/dL — ABNORMAL HIGH (ref 70–99)
Potassium: 3.8 mmol/L (ref 3.5–5.1)
Sodium: 133 mmol/L — ABNORMAL LOW (ref 135–145)

## 2019-11-18 NOTE — Patient Instructions (Signed)
It was a pleasure seeing you today!  MEDICATIONS: -No medication changes today -Call if you have questions about your medications.  LABS: -We will call you if your labs need attention.  NEXT APPOINTMENT: Return to clinic in 2 months with Dr. [name].  In general, to take care of your heart failure: -Limit your fluid intake to 2 Liters (half-gallon) per day.   -Limit your salt intake to ideally 2-3 grams (2000-3000 mg) per day. -Weigh yourself daily and record, and bring that "weight diary" to your next appointment.  (Weight gain of 2-3 pounds in 1 day typically means fluid weight.) -The medications for your heart are to help your heart and help you live longer.   -Please contact us before stopping any of your heart medications.  Call the clinic at [phone] with questions or to reschedule future appointments.

## 2019-11-26 ENCOUNTER — Other Ambulatory Visit (HOSPITAL_COMMUNITY): Payer: Self-pay | Admitting: Internal Medicine

## 2019-11-26 ENCOUNTER — Other Ambulatory Visit (HOSPITAL_COMMUNITY): Payer: Self-pay | Admitting: *Deleted

## 2019-11-26 MED ORDER — AMLODIPINE BESYLATE 10 MG PO TABS: 10.0000 mg | ORAL_TABLET | Freq: Every day | ORAL | 2 refills | Status: DC

## 2019-11-26 MED ORDER — SPIRONOLACTONE 25 MG PO TABS: 25.0000 mg | ORAL_TABLET | Freq: Every day | ORAL | 2 refills | Status: DC

## 2019-11-26 MED ORDER — CARVEDILOL 12.5 MG PO TABS: 12.5000 mg | ORAL_TABLET | Freq: Two times a day (BID) | ORAL | 2 refills | Status: DC

## 2019-11-26 MED ORDER — TORSEMIDE 20 MG PO TABS: 60.0000 mg | ORAL_TABLET | Freq: Every day | ORAL | 2 refills | Status: DC

## 2019-11-26 MED ORDER — CLONIDINE HCL 0.2 MG PO TABS: 0.2000 mg | ORAL_TABLET | Freq: Two times a day (BID) | ORAL | 2 refills | Status: DC

## 2019-11-26 MED ORDER — HYDRALAZINE HCL 100 MG PO TABS: 100.0000 mg | ORAL_TABLET | Freq: Three times a day (TID) | ORAL | 2 refills | Status: DC

## 2019-11-26 MED ORDER — POTASSIUM CHLORIDE CRYS ER 20 MEQ PO TBCR: 20.0000 meq | EXTENDED_RELEASE_TABLET | Freq: Every day | ORAL | 2 refills | Status: DC

## 2019-11-26 MED ORDER — ISOSORBIDE MONONITRATE ER 30 MG PO TB24: 30.0000 mg | ORAL_TABLET | Freq: Every day | ORAL | 2 refills | Status: DC

## 2019-11-26 MED FILL — CARVEDILOL 12.5 MG TABLET: 12.5 | 30 days supply | Qty: 60 | Fill #0

## 2019-11-26 MED FILL — TORSEMIDE 20 MG TABLET: 20 | 30 days supply | Qty: 90 | Fill #0

## 2019-11-26 MED FILL — hydrALAZINE HCL 100 MG TABS: 100 | 30 days supply | Qty: 90 | Fill #0

## 2019-11-26 MED FILL — POTASSIUM CHLORIDE CRYS ER: 20 | 30 days supply | Qty: 30 | Fill #0

## 2019-11-26 MED FILL — AMLODIPINE BESYLATE 10 MG T: 10 | 30 days supply | Qty: 30 | Fill #0

## 2019-11-26 MED FILL — cloNIDine HCL 0.2 MG TABS: 0.2 | 30 days supply | Qty: 60 | Fill #0

## 2019-11-26 MED FILL — ISOSORBIDE MN ER 30 MG TAB: 30 | 30 days supply | Qty: 30 | Fill #0

## 2019-11-26 MED FILL — SPIRONOLACTONE 25 MG TABS: 25 | 30 days supply | Qty: 30 | Fill #0

## 2020-01-21 ENCOUNTER — Encounter (HOSPITAL_COMMUNITY): Payer: Medicaid Other | Admitting: Cardiology

## 2020-04-05 MED FILL — hydrALAZINE HCL 100 MG TABS: 100 | 30 days supply | Qty: 90 | Fill #1

## 2020-05-26 MED FILL — TORSEMIDE 20 MG TABLET: 20 | 30 days supply | Qty: 90 | Fill #1

## 2020-07-27 MED FILL — TORSEMIDE 20 MG TABLET: 20 | 30 days supply | Qty: 90 | Fill #2

## 2020-08-24 ENCOUNTER — Telehealth (HOSPITAL_COMMUNITY): Payer: Self-pay | Admitting: Cardiology

## 2020-08-24 ENCOUNTER — Other Ambulatory Visit (HOSPITAL_COMMUNITY): Payer: Self-pay

## 2020-08-24 MED ORDER — TORSEMIDE 20 MG PO TABS
60.0000 mg | ORAL_TABLET | Freq: Every day | ORAL | 2 refills | Status: DC
  Filled 2020-08-24: qty 90, 30d supply, fill #0

## 2020-08-24 NOTE — Telephone Encounter (Signed)
Pt request Torsemide 20 MG tablet , please send script to [facility], pt can be reached [phone], appt scheduled 05/17 w/DM

## 2020-08-24 NOTE — Addendum Note (Signed)
Addended by: Kerry Dory on: 08/24/2020 02:06 PM   Modules accepted: Orders

## 2020-08-26 ENCOUNTER — Other Ambulatory Visit (HOSPITAL_COMMUNITY): Payer: Self-pay

## 2020-09-21 ENCOUNTER — Encounter (HOSPITAL_COMMUNITY): Payer: Self-pay | Admitting: Cardiology

## 2020-09-21 ENCOUNTER — Other Ambulatory Visit: Payer: Self-pay

## 2020-09-21 ENCOUNTER — Ambulatory Visit (HOSPITAL_COMMUNITY)
Admission: RE | Admit: 2020-09-21 | Discharge: 2020-09-21 | Disposition: A | Discharge status: Home / Self Care | Payer: Medicaid Other | Source: Ambulatory Visit | Attending: Cardiology | Admitting: Cardiology

## 2020-09-21 ENCOUNTER — Other Ambulatory Visit (HOSPITAL_COMMUNITY): Payer: Self-pay

## 2020-09-21 VITALS — BP 160/90 | HR 88 | Wt 229.8 lb

## 2020-09-21 DIAGNOSIS — Z79899 Other long term (current) drug therapy: Secondary | ICD-10-CM | POA: Diagnosis not present

## 2020-09-21 DIAGNOSIS — I130 Hypertensive heart and chronic kidney disease with heart failure and stage 1 through stage 4 chronic kidney disease, or unspecified chronic kidney disease: Secondary | ICD-10-CM | POA: Diagnosis not present

## 2020-09-21 DIAGNOSIS — I2510 Atherosclerotic heart disease of native coronary artery without angina pectoris: Secondary | ICD-10-CM

## 2020-09-21 DIAGNOSIS — I5042 Chronic combined systolic (congestive) and diastolic (congestive) heart failure: Secondary | ICD-10-CM | POA: Diagnosis not present

## 2020-09-21 DIAGNOSIS — Z7982 Long term (current) use of aspirin: Secondary | ICD-10-CM | POA: Insufficient documentation

## 2020-09-21 DIAGNOSIS — I5022 Chronic systolic (congestive) heart failure: Secondary | ICD-10-CM | POA: Diagnosis present

## 2020-09-21 DIAGNOSIS — I255 Ischemic cardiomyopathy: Secondary | ICD-10-CM | POA: Insufficient documentation

## 2020-09-21 DIAGNOSIS — F17210 Nicotine dependence, cigarettes, uncomplicated: Secondary | ICD-10-CM | POA: Insufficient documentation

## 2020-09-21 DIAGNOSIS — Z8249 Family history of ischemic heart disease and other diseases of the circulatory system: Secondary | ICD-10-CM | POA: Insufficient documentation

## 2020-09-21 DIAGNOSIS — N183 Chronic kidney disease, stage 3 unspecified: Secondary | ICD-10-CM | POA: Diagnosis not present

## 2020-09-21 DIAGNOSIS — N1832 Chronic kidney disease, stage 3b: Secondary | ICD-10-CM

## 2020-09-21 DIAGNOSIS — E785 Hyperlipidemia, unspecified: Secondary | ICD-10-CM

## 2020-09-21 DIAGNOSIS — Z720 Tobacco use: Secondary | ICD-10-CM

## 2020-09-21 DIAGNOSIS — Z951 Presence of aortocoronary bypass graft: Secondary | ICD-10-CM | POA: Diagnosis not present

## 2020-09-21 LAB — BASIC METABOLIC PANEL
Anion gap: 10 (ref 5–15)
BUN: 24 mg/dL — ABNORMAL HIGH (ref 6–20)
CO2: 25 mmol/L (ref 22–32)
Calcium: 9.6 mg/dL (ref 8.9–10.3)
Chloride: 101 mmol/L (ref 98–111)
Creatinine, Ser: 2.62 mg/dL — ABNORMAL HIGH (ref 0.61–1.24)
GFR, Estimated: 29 mL/min — ABNORMAL LOW (ref >60)
Glucose, Bld: 81 mg/dL (ref 70–99)
Potassium: 3.2 mmol/L — ABNORMAL LOW (ref 3.5–5.1)
Sodium: 136 mmol/L (ref 135–145)

## 2020-09-21 LAB — LIPID PANEL
Cholesterol: 300 mg/dL — ABNORMAL HIGH (ref 0–200)
HDL: 55 mg/dL (ref >40)
LDL Cholesterol: 219 mg/dL — ABNORMAL HIGH (ref 0–99)
Total CHOL/HDL Ratio: 5.5 RATIO
Triglycerides: 130 mg/dL (ref <150)
VLDL: 26 mg/dL (ref 0–40)

## 2020-09-21 LAB — BRAIN NATRIURETIC PEPTIDE: B Natriuretic Peptide: 914.5 pg/mL — ABNORMAL HIGH (ref 0.0–100.0)

## 2020-09-21 MED ORDER — POTASSIUM CHLORIDE CRYS ER 20 MEQ PO TBCR
20.0000 meq | EXTENDED_RELEASE_TABLET | Freq: Every day | ORAL | 3 refills | Status: DC
  Filled 2020-09-21: qty 30, 30d supply, fill #0

## 2020-09-21 MED ORDER — SPIRONOLACTONE 25 MG PO TABS
25.0000 mg | ORAL_TABLET | Freq: Every day | ORAL | 3 refills | Status: DC
  Filled 2020-09-21: qty 30, 30d supply, fill #0

## 2020-09-21 MED ORDER — HYDRALAZINE HCL 100 MG PO TABS
100.0000 mg | ORAL_TABLET | Freq: Three times a day (TID) | ORAL | 3 refills | Status: DC
  Filled 2020-09-21: qty 90, 30d supply, fill #0

## 2020-09-21 MED ORDER — ASPIRIN EC 81 MG PO TBEC
81.0000 mg | DELAYED_RELEASE_TABLET | Freq: Every day | ORAL | 3 refills | Status: DC
  Filled 2020-09-21: qty 30, 30d supply, fill #0

## 2020-09-21 MED ORDER — AMLODIPINE BESYLATE 10 MG PO TABS
10.0000 mg | ORAL_TABLET | Freq: Every day | ORAL | 3 refills | Status: DC
  Filled 2020-09-21: qty 30, 30d supply, fill #0

## 2020-09-21 MED ORDER — CARVEDILOL 12.5 MG PO TABS
12.5000 mg | ORAL_TABLET | Freq: Two times a day (BID) | ORAL | 3 refills | Status: DC
  Filled 2020-09-21: qty 60, 30d supply, fill #0

## 2020-09-21 MED ORDER — CLONIDINE HCL 0.2 MG PO TABS
0.2000 mg | ORAL_TABLET | Freq: Two times a day (BID) | ORAL | 3 refills | Status: DC
  Filled 2020-09-21: qty 60, 30d supply, fill #0

## 2020-09-21 MED ORDER — ISOSORBIDE MONONITRATE ER 30 MG PO TB24
15.0000 mg | ORAL_TABLET | Freq: Every day | ORAL | 3 refills | Status: DC
  Filled 2020-09-21: qty 15, 30d supply, fill #0

## 2020-09-21 MED ORDER — TORSEMIDE 20 MG PO TABS
60.0000 mg | ORAL_TABLET | Freq: Two times a day (BID) | ORAL | 3 refills | Status: DC
  Filled 2020-09-21: qty 180, 30d supply, fill #0

## 2020-09-21 NOTE — Patient Instructions (Signed)
Restart Meds:   Torsemide 60 mg (3 tabs) Twice daily   Amlodipine 10 mg Daily  Imdur 15 mg (1/2 tab) Daily  Hydralazine 100 mg Three times a day   Clonidine 0.2 mg Twice daily   Carvedilol 12.5 mg Twice daily   Aspirin 81 mg Daily  Potassium 20 meq Daily  Spironolactone 25 mg Daily  Labs done today, we will call you for abnormal results  Your physician recommends that you return for lab work in: 10-14 days  You have been referred to the [facility] for your cholesterol, they will call you for an appointment  Your physician recommends that you schedule a follow-up appointment in: 1 month  Your physician recommends that you schedule a follow-up appointment in: 3 months with an echocardiogram  Do the following things EVERYDAY: 1) Weigh yourself in the morning before breakfast. Write it down and keep it in a log. 2) Take your medicines as prescribed 3) Eat low salt foods--Limit salt (sodium) to 2000 mg per day.  4) Stay as active as you can everyday 5) Limit all fluids for the day to less than 2 liters  At the [facility], you and your health needs are our priority. As part of our continuing mission to provide you with exceptional heart care, we have created designated Provider Care Teams. These Care Teams include your primary Cardiologist (physician) and Advanced Practice Providers (APPs- Physician Assistants and Nurse Practitioners) who all work together to provide you with the care you need, when you need it.   You may see any of the following providers on your designated Care Team at your next follow up: Marland Kitchen Dr Glori Bickers . Dr Loralie Champagne . Dr Vickki Muff . Darrick Grinder, NP . Lyda Jester, [place] . Audry Riles, PharmD   Please be sure to bring in all your medications bottles to every appointment.    If you have any questions or concerns before your next appointment please send [place] a message through [place] or call our office  at [phone].    TO LEAVE A MESSAGE FOR THE NURSE SELECT OPTION 2, PLEASE LEAVE A MESSAGE INCLUDING: . YOUR NAME . DATE OF BIRTH . CALL BACK NUMBER . REASON FOR CALL**this is important as we prioritize the call backs  YOU WILL RECEIVE A CALL BACK THE SAME DAY AS LONG AS YOU CALL BEFORE 4:00 PM

## 2020-09-21 NOTE — Progress Notes (Signed)
ReDS Vest / Clip - 09/21/20 1600      ReDS Vest / Clip   Station Marker D    Ruler Value 37    ReDS Value Range High volume overload    ReDS Actual Value 46

## 2020-09-22 ENCOUNTER — Telehealth (HOSPITAL_COMMUNITY): Payer: Self-pay | Admitting: Pharmacy Technician

## 2020-09-22 ENCOUNTER — Other Ambulatory Visit (HOSPITAL_COMMUNITY): Payer: Self-pay

## 2020-09-22 ENCOUNTER — Other Ambulatory Visit (HOSPITAL_COMMUNITY): Payer: Self-pay | Admitting: *Deleted

## 2020-09-22 MED ORDER — POTASSIUM CHLORIDE CRYS ER 20 MEQ PO TBCR
20.0000 meq | EXTENDED_RELEASE_TABLET | Freq: Every day | ORAL | 3 refills | Status: DC
  Filled 2020-09-22: qty 30, 30d supply, fill #0

## 2020-09-22 MED ORDER — TORSEMIDE 20 MG PO TABS
60.0000 mg | ORAL_TABLET | Freq: Two times a day (BID) | ORAL | 3 refills | Status: DC
  Filled 2020-09-22: qty 180, 30d supply, fill #0

## 2020-09-22 MED ORDER — ISOSORBIDE MONONITRATE ER 30 MG PO TB24
15.0000 mg | ORAL_TABLET | Freq: Every day | ORAL | 3 refills | Status: DC
  Filled 2020-09-22: qty 15, 30d supply, fill #0

## 2020-09-22 MED ORDER — AMLODIPINE BESYLATE 10 MG PO TABS
10.0000 mg | ORAL_TABLET | Freq: Every day | ORAL | 3 refills | Status: DC
  Filled 2020-09-22: qty 30, 30d supply, fill #0

## 2020-09-22 MED ORDER — CARVEDILOL 12.5 MG PO TABS
12.5000 mg | ORAL_TABLET | Freq: Two times a day (BID) | ORAL | 3 refills | Status: DC
  Filled 2020-09-22: qty 60, 30d supply, fill #0

## 2020-09-22 MED ORDER — SPIRONOLACTONE 25 MG PO TABS
25.0000 mg | ORAL_TABLET | Freq: Every day | ORAL | 3 refills | Status: DC
  Filled 2020-09-22: qty 30, 30d supply, fill #0

## 2020-09-22 MED ORDER — HYDRALAZINE HCL 100 MG PO TABS
100.0000 mg | ORAL_TABLET | Freq: Three times a day (TID) | ORAL | 3 refills | Status: DC
  Filled 2020-09-22: qty 90, 30d supply, fill #0

## 2020-09-22 NOTE — Addendum Note (Signed)
Encounter addended by: Larey Dresser, MD on: 09/22/2020 10:40 PM  Actions taken: Clinical Note Signed
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
## 2020-09-22 NOTE — Telephone Encounter (Signed)
Advanced Heart Failure Patient Advocate Encounter  Patient called in stating that he was not able to pick up his medications from the pharmacy. He has out of state medicaid and the insurance won't pay for the medications here in [place].  Called and spoke with [facility] outpatient pharmacy. We would need to send new RXs with the comment, use HF fund.  Sent Jasmine, ([place]) a request to send in new RXs. The patient is aware that Clonidine is not apart of the HF fund. Encouraged him to check the pricing on Clonidine at the pharmacy and use a goodrx card if its expensive.  Charlann Boxer, CPhT

## 2020-09-28 ENCOUNTER — Other Ambulatory Visit: Payer: Self-pay

## 2020-09-28 ENCOUNTER — Ambulatory Visit (HOSPITAL_COMMUNITY)
Admission: RE | Admit: 2020-09-28 | Discharge: 2020-09-28 | Disposition: A | Discharge status: Home / Self Care | Payer: Medicaid Other | Source: Ambulatory Visit | Attending: Cardiology | Admitting: Cardiology

## 2020-09-28 DIAGNOSIS — I5042 Chronic combined systolic (congestive) and diastolic (congestive) heart failure: Secondary | ICD-10-CM

## 2020-09-28 LAB — BASIC METABOLIC PANEL
Anion gap: 8 (ref 5–15)
BUN: 24 mg/dL — ABNORMAL HIGH (ref 6–20)
CO2: 28 mmol/L (ref 22–32)
Calcium: 9.3 mg/dL (ref 8.9–10.3)
Chloride: 99 mmol/L (ref 98–111)
Creatinine, Ser: 2.48 mg/dL — ABNORMAL HIGH (ref 0.61–1.24)
GFR, Estimated: 31 mL/min — ABNORMAL LOW (ref >60)
Glucose, Bld: 89 mg/dL (ref 70–99)
Potassium: 3 mmol/L — ABNORMAL LOW (ref 3.5–5.1)
Sodium: 135 mmol/L (ref 135–145)

## 2020-09-29 ENCOUNTER — Other Ambulatory Visit (HOSPITAL_COMMUNITY): Payer: Self-pay

## 2020-09-30 ENCOUNTER — Other Ambulatory Visit (HOSPITAL_COMMUNITY): Payer: Self-pay

## 2020-10-01 ENCOUNTER — Telehealth (HOSPITAL_COMMUNITY): Payer: Self-pay

## 2020-10-01 DIAGNOSIS — I5042 Chronic combined systolic (congestive) and diastolic (congestive) heart failure: Secondary | ICD-10-CM

## 2020-10-01 MED ORDER — POTASSIUM CHLORIDE CRYS ER 20 MEQ PO TBCR: 40.0000 meq | EXTENDED_RELEASE_TABLET | Freq: Every day | ORAL | 3 refills | Status: DC

## 2020-10-01 NOTE — Telephone Encounter (Signed)
-----   Message from Larey Dresser, MD sent at 09/29/2020  1:54 PM EDT ----- Need to increase total daily KCl by 20 mEq and repeat BMET 1 week.

## 2020-10-01 NOTE — Telephone Encounter (Signed)
Patient advised and verbalized understanding. Med list updated to reflect changes. New rx sent into patients pharmacy. Lab appt scheduled,lab order entered.  Meds ordered this encounter  Medications  . potassium chloride SA (KLOR-CON) 20 MEQ tablet    Sig: Take 2 tablets (40 mEq total) by mouth daily.    Dispense:  60 tablet    Refill:  3    HF FUND   Orders Placed This Encounter  Procedures  . Basic Metabolic Panel (BMET)    Standing Status:   Future    Standing Expiration Date:   10/01/2021    Order Specific Question:   Release to patient    Answer:   Immediate

## 2020-10-08 ENCOUNTER — Other Ambulatory Visit (HOSPITAL_COMMUNITY): Payer: Medicaid Other

## 2020-10-22 ENCOUNTER — Encounter (HOSPITAL_COMMUNITY): Payer: Medicaid Other

## 2020-11-18 ENCOUNTER — Other Ambulatory Visit (HOSPITAL_COMMUNITY): Payer: Self-pay

## 2020-11-18 DIAGNOSIS — I5042 Chronic combined systolic (congestive) and diastolic (congestive) heart failure: Secondary | ICD-10-CM

## 2020-11-18 NOTE — Progress Notes (Signed)
Orders Placed This Encounter  Procedures   ECHOCARDIOGRAM COMPLETE    Standing Status:   Future    Standing Expiration Date:   11/18/2021    Order Specific Question:   Where should this test be performed    Answer:   [place]    Order Specific Question:   Perflutren DEFINITY (image enhancing agent) should be administered unless hypersensitivity or allergy exist    Answer:   Administer Perflutren    Order Specific Question:   Reason for exam-Echo    Answer:   Congestive Heart Failure  I50.9    Order Specific Question:   Release to patient    Answer:   Immediate

## 2020-12-29 ENCOUNTER — Ambulatory Visit (HOSPITAL_COMMUNITY)
Admission: RE | Admit: 2020-12-29 | Discharge: 2020-12-29 | Disposition: A | Discharge status: Home / Self Care | Payer: Medicaid Other | Source: Ambulatory Visit | Attending: Cardiology | Admitting: Cardiology

## 2020-12-29 ENCOUNTER — Encounter (HOSPITAL_COMMUNITY): Payer: Self-pay | Admitting: Cardiology

## 2020-12-29 ENCOUNTER — Other Ambulatory Visit (HOSPITAL_COMMUNITY): Payer: Self-pay

## 2020-12-29 ENCOUNTER — Other Ambulatory Visit: Payer: Self-pay

## 2020-12-29 ENCOUNTER — Ambulatory Visit (HOSPITAL_BASED_OUTPATIENT_CLINIC_OR_DEPARTMENT_OTHER)
Admission: RE | Admit: 2020-12-29 | Discharge: 2020-12-29 | Disposition: A | Discharge status: Home / Self Care | Payer: Medicaid Other | Source: Ambulatory Visit | Attending: Cardiology | Admitting: Cardiology

## 2020-12-29 VITALS — BP 158/80 | HR 89 | Wt 222.2 lb

## 2020-12-29 DIAGNOSIS — R9431 Abnormal electrocardiogram [ECG] [EKG]: Secondary | ICD-10-CM | POA: Insufficient documentation

## 2020-12-29 DIAGNOSIS — F17200 Nicotine dependence, unspecified, uncomplicated: Secondary | ICD-10-CM | POA: Insufficient documentation

## 2020-12-29 DIAGNOSIS — F1910 Other psychoactive substance abuse, uncomplicated: Secondary | ICD-10-CM | POA: Diagnosis not present

## 2020-12-29 DIAGNOSIS — Z951 Presence of aortocoronary bypass graft: Secondary | ICD-10-CM | POA: Insufficient documentation

## 2020-12-29 DIAGNOSIS — E785 Hyperlipidemia, unspecified: Secondary | ICD-10-CM | POA: Diagnosis not present

## 2020-12-29 DIAGNOSIS — I5042 Chronic combined systolic (congestive) and diastolic (congestive) heart failure: Secondary | ICD-10-CM

## 2020-12-29 DIAGNOSIS — I083 Combined rheumatic disorders of mitral, aortic and tricuspid valves: Secondary | ICD-10-CM | POA: Diagnosis not present

## 2020-12-29 DIAGNOSIS — I2510 Atherosclerotic heart disease of native coronary artery without angina pectoris: Secondary | ICD-10-CM | POA: Diagnosis not present

## 2020-12-29 DIAGNOSIS — R001 Bradycardia, unspecified: Secondary | ICD-10-CM | POA: Diagnosis not present

## 2020-12-29 DIAGNOSIS — R079 Chest pain, unspecified: Secondary | ICD-10-CM | POA: Diagnosis not present

## 2020-12-29 LAB — BASIC METABOLIC PANEL
Anion gap: 9 (ref 5–15)
BUN: 26 mg/dL — ABNORMAL HIGH (ref 6–20)
CO2: 25 mmol/L (ref 22–32)
Calcium: 9.1 mg/dL (ref 8.9–10.3)
Chloride: 102 mmol/L (ref 98–111)
Creatinine, Ser: 2.5 mg/dL — ABNORMAL HIGH (ref 0.61–1.24)
GFR, Estimated: 31 mL/min — ABNORMAL LOW (ref >60)
Glucose, Bld: 93 mg/dL (ref 70–99)
Potassium: 3.5 mmol/L (ref 3.5–5.1)
Sodium: 136 mmol/L (ref 135–145)

## 2020-12-29 LAB — ECHOCARDIOGRAM COMPLETE
Area-P 1/2: 5.13 cm2
Calc EF: 15.5 %
MV M vel: 4.63 m/s
MV Peak grad: 85.7 mmHg
Radius: 0.5 cm
S' Lateral: 5.7 cm
Single Plane A2C EF: 13.7 %
Single Plane A4C EF: 15.5 %

## 2020-12-29 LAB — BRAIN NATRIURETIC PEPTIDE: B Natriuretic Peptide: 873 pg/mL — ABNORMAL HIGH (ref 0.0–100.0)

## 2020-12-29 MED ORDER — HYDRALAZINE HCL 100 MG PO TABS
100.0000 mg | ORAL_TABLET | Freq: Three times a day (TID) | ORAL | 3 refills | Status: DC
  Filled 2020-12-29: qty 90, 30d supply, fill #0
  Filled 2021-02-15: qty 90, 30d supply, fill #1

## 2020-12-29 MED ORDER — CARVEDILOL 12.5 MG PO TABS
12.5000 mg | ORAL_TABLET | Freq: Two times a day (BID) | ORAL | 3 refills | Status: DC
  Filled 2020-12-29: qty 60, 30d supply, fill #0

## 2020-12-29 MED ORDER — POTASSIUM CHLORIDE CRYS ER 20 MEQ PO TBCR
40.0000 meq | EXTENDED_RELEASE_TABLET | Freq: Every day | ORAL | 3 refills | Status: DC
  Filled 2020-12-29: qty 60, 30d supply, fill #0

## 2020-12-29 MED ORDER — AMLODIPINE BESYLATE 10 MG PO TABS
10.0000 mg | ORAL_TABLET | Freq: Every day | ORAL | 3 refills | Status: DC
  Filled 2020-12-29: qty 30, 30d supply, fill #0
  Filled 2021-02-15: qty 30, 30d supply, fill #1

## 2020-12-29 MED ORDER — ISOSORBIDE MONONITRATE ER 30 MG PO TB24
15.0000 mg | ORAL_TABLET | Freq: Every day | ORAL | 3 refills | Status: DC
  Filled 2020-12-29: qty 15, 30d supply, fill #0
  Filled 2021-02-15: qty 15, 30d supply, fill #1

## 2020-12-29 MED ORDER — ATORVASTATIN CALCIUM 80 MG PO TABS
80.0000 mg | ORAL_TABLET | Freq: Every day | ORAL | 3 refills | Status: DC
  Filled 2020-12-29: qty 30, 30d supply, fill #0
  Filled 2021-02-15: qty 30, 30d supply, fill #1

## 2020-12-29 MED ORDER — TORSEMIDE 20 MG PO TABS
ORAL_TABLET | ORAL | 3 refills | Status: DC
  Filled 2020-12-29: qty 210, 30d supply, fill #0
  Filled 2021-02-15: qty 210, 30d supply, fill #1

## 2020-12-29 MED ORDER — SPIRONOLACTONE 25 MG PO TABS
25.0000 mg | ORAL_TABLET | Freq: Every day | ORAL | 3 refills | Status: DC
  Filled 2020-12-29: qty 30, 30d supply, fill #0
  Filled 2021-02-15: qty 30, 30d supply, fill #1

## 2020-12-29 NOTE — Patient Instructions (Addendum)
Increase Torsemide to 80 mg (4 tabs) in AM and 60 mg (3 tabs) in PM  Start Atorvastatin 80 mg Daily  Continue:  Hydralazine 100 mg Three times a day  Potassium 40 meq (2 tabs) Daily  Restart: Amlodipine 10 mg Daily Carvedilol 12.5 mg Twice daily  Aspirin 81 mg Daily Imdur 15 mg (1/2 tab) Daily Spironolactone 25 mg Daily  All prescriptions have been sent to the [facility], if you have any trouble getting these medications please let us know  Labs done today, we will call you for abnormal results  Your physician recommends that you return for lab work in: 1-2 weeks  Your physician recommends that you schedule a follow-up appointment in: 3 weeks  If you have any questions or concerns before your next appointment please send [place] a message through [place] or call our office at [phone].    TO LEAVE A MESSAGE FOR THE NURSE SELECT OPTION 2, PLEASE LEAVE A MESSAGE INCLUDING: YOUR NAME DATE OF BIRTH CALL BACK NUMBER REASON FOR CALL**this is important as we prioritize the call backs  YOU WILL RECEIVE A CALL BACK THE SAME DAY AS LONG AS YOU CALL BEFORE 4:00 PM  At the [facility], you and your health needs are our priority. As part of our continuing mission to provide you with exceptional heart care, we have created designated Provider Care Teams. These Care Teams include your primary Cardiologist (physician) and Advanced Practice Providers (APPs- Physician Assistants and Nurse Practitioners) who all work together to provide you with the care you need, when you need it.   You may see any of the following providers on your designated Care Team at your next follow up: Dr Glori Bickers Dr Loralie Champagne Dr Patrice Paradise, NP Lyda Jester, [place] Ginnie Smart Audry Riles, PharmD   Please be sure to bring in all your medications bottles to every appointment.
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
## 2020-12-29 NOTE — Progress Notes (Signed)
  Echocardiogram 2D Echocardiogram has been performed.  Scott Salinas 12/29/2020, 12:03 PM

## 2021-01-11 ENCOUNTER — Other Ambulatory Visit: Payer: Self-pay

## 2021-01-11 ENCOUNTER — Telehealth (HOSPITAL_COMMUNITY): Payer: Self-pay | Admitting: *Deleted

## 2021-01-11 ENCOUNTER — Ambulatory Visit (HOSPITAL_COMMUNITY)
Admission: RE | Admit: 2021-01-11 | Discharge: 2021-01-11 | Disposition: A | Discharge status: Home / Self Care | Payer: Medicaid Other | Source: Ambulatory Visit | Attending: Cardiology | Admitting: Cardiology

## 2021-01-11 DIAGNOSIS — I5042 Chronic combined systolic (congestive) and diastolic (congestive) heart failure: Secondary | ICD-10-CM

## 2021-01-11 LAB — BASIC METABOLIC PANEL
Anion gap: 9 (ref 5–15)
BUN: 25 mg/dL — ABNORMAL HIGH (ref 6–20)
CO2: 27 mmol/L (ref 22–32)
Calcium: 8.5 mg/dL — ABNORMAL LOW (ref 8.9–10.3)
Chloride: 98 mmol/L (ref 98–111)
Creatinine, Ser: 2.52 mg/dL — ABNORMAL HIGH (ref 0.61–1.24)
GFR, Estimated: 30 mL/min — ABNORMAL LOW (ref >60)
Glucose, Bld: 94 mg/dL (ref 70–99)
Potassium: 2.7 mmol/L — CL (ref 3.5–5.1)
Sodium: 134 mmol/L — ABNORMAL LOW (ref 135–145)

## 2021-01-11 MED ORDER — POTASSIUM CHLORIDE CRYS ER 20 MEQ PO TBCR: 60.0000 meq | EXTENDED_RELEASE_TABLET | Freq: Two times a day (BID) | ORAL | 3 refills | Status: DC

## 2021-01-11 NOTE — Telephone Encounter (Signed)
Lab called to report critical k 2.7. RN Valeda Malm notified

## 2021-01-11 NOTE — Telephone Encounter (Signed)
[place], RN  01/11/2021  4:57 PM EDT Back to Top    Pt aware, he is taking meds and will increase KCL to 60 meq BID, repeat lab sch 9/9   Larey Dresser, MD  01/11/2021  4:33 PM EDT     Make sure he is taking spironolactone and potassium 40 daily.  If not taking, start spironolactone 25 mg daily and KCl 40 bid.  If he is taking, increase KCl to 60 mEq bid.  Needs repeat BMET in 3 days.

## 2021-01-11 NOTE — Addendum Note (Signed)
Addended by: Scarlette Calico on: 01/11/2021 05:00 PM   Modules accepted: Orders

## 2021-01-14 ENCOUNTER — Other Ambulatory Visit (HOSPITAL_COMMUNITY): Payer: Medicaid Other

## 2021-01-17 ENCOUNTER — Telehealth (HOSPITAL_COMMUNITY): Payer: Self-pay

## 2021-01-17 NOTE — Telephone Encounter (Signed)
Called and left patient a detailed message of the below information and to confirm/remind patient of their appointment at the [facility] on 01/18/21.   Patient reminded to bring all medications and/or complete list.

## 2021-01-18 ENCOUNTER — Encounter (HOSPITAL_COMMUNITY): Payer: Self-pay

## 2021-01-18 ENCOUNTER — Other Ambulatory Visit: Payer: Self-pay

## 2021-01-18 ENCOUNTER — Ambulatory Visit (HOSPITAL_COMMUNITY)
Admission: RE | Admit: 2021-01-18 | Discharge: 2021-01-18 | Disposition: A | Discharge status: Home / Self Care | Payer: Medicaid Other | Source: Ambulatory Visit | Attending: Cardiology | Admitting: Cardiology

## 2021-01-18 VITALS — BP 122/98 | HR 71 | Wt 225.4 lb

## 2021-01-18 DIAGNOSIS — I10 Essential (primary) hypertension: Secondary | ICD-10-CM | POA: Diagnosis not present

## 2021-01-18 DIAGNOSIS — F1290 Cannabis use, unspecified, uncomplicated: Secondary | ICD-10-CM | POA: Insufficient documentation

## 2021-01-18 DIAGNOSIS — I130 Hypertensive heart and chronic kidney disease with heart failure and stage 1 through stage 4 chronic kidney disease, or unspecified chronic kidney disease: Secondary | ICD-10-CM | POA: Diagnosis not present

## 2021-01-18 DIAGNOSIS — I5022 Chronic systolic (congestive) heart failure: Secondary | ICD-10-CM | POA: Insufficient documentation

## 2021-01-18 DIAGNOSIS — Z79899 Other long term (current) drug therapy: Secondary | ICD-10-CM | POA: Insufficient documentation

## 2021-01-18 DIAGNOSIS — I2510 Atherosclerotic heart disease of native coronary artery without angina pectoris: Secondary | ICD-10-CM | POA: Insufficient documentation

## 2021-01-18 DIAGNOSIS — Z8249 Family history of ischemic heart disease and other diseases of the circulatory system: Secondary | ICD-10-CM | POA: Insufficient documentation

## 2021-01-18 DIAGNOSIS — N183 Chronic kidney disease, stage 3 unspecified: Secondary | ICD-10-CM | POA: Insufficient documentation

## 2021-01-18 DIAGNOSIS — Z951 Presence of aortocoronary bypass graft: Secondary | ICD-10-CM | POA: Insufficient documentation

## 2021-01-18 DIAGNOSIS — Z7982 Long term (current) use of aspirin: Secondary | ICD-10-CM | POA: Diagnosis not present

## 2021-01-18 DIAGNOSIS — E785 Hyperlipidemia, unspecified: Secondary | ICD-10-CM | POA: Insufficient documentation

## 2021-01-18 DIAGNOSIS — F17210 Nicotine dependence, cigarettes, uncomplicated: Secondary | ICD-10-CM | POA: Diagnosis not present

## 2021-01-18 DIAGNOSIS — I5042 Chronic combined systolic (congestive) and diastolic (congestive) heart failure: Secondary | ICD-10-CM | POA: Diagnosis not present

## 2021-01-18 LAB — BASIC METABOLIC PANEL
Anion gap: 10 (ref 5–15)
BUN: 24 mg/dL — ABNORMAL HIGH (ref 6–20)
CO2: 23 mmol/L (ref 22–32)
Calcium: 9.1 mg/dL (ref 8.9–10.3)
Chloride: 100 mmol/L (ref 98–111)
Creatinine, Ser: 2.58 mg/dL — ABNORMAL HIGH (ref 0.61–1.24)
GFR, Estimated: 29 mL/min — ABNORMAL LOW (ref >60)
Glucose, Bld: 78 mg/dL (ref 70–99)
Potassium: 4.1 mmol/L (ref 3.5–5.1)
Sodium: 133 mmol/L — ABNORMAL LOW (ref 135–145)
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
## 2021-01-18 NOTE — Patient Instructions (Signed)
It was great to see you today! No medication changes are needed at this time.   Labs today We will only contact you if something comes back abnormal or we need to make some changes. Otherwise no news is good news!  Your physician recommends that you schedule a follow-up appointment in:  [place], 8 WEEKS  in the Advanced Practitioners (PA/NP) Clinic, AND IN [place]  Do the following things EVERYDAY: Weigh yourself in the morning before breakfast. Write it down and keep it in a log. Take your medicines as prescribed Eat low salt foods--Limit salt (sodium) to 2000 mg per day.  Stay as active as you can everyday Limit all fluids for the day to less than 2 liters   At the [facility], you and your health needs are our priority. As part of our continuing mission to provide you with exceptional heart care, we have created designated Provider Care Teams. These Care Teams include your primary Cardiologist (physician) and Advanced Practice Providers (APPs- Physician Assistants and Nurse Practitioners) who all work together to provide you with the care you need, when you need it.   You may see any of the following providers on your designated Care Team at your next follow up: Dr Glori Bickers Dr Loralie Champagne Dr Patrice Paradise, NP Lyda Jester, [place] Ginnie Smart Audry Riles, PharmD   Please be sure to bring in all your medications bottles to every appointment.

## 2021-02-15 ENCOUNTER — Other Ambulatory Visit (HOSPITAL_COMMUNITY): Payer: Self-pay

## 2021-02-15 ENCOUNTER — Ambulatory Visit (HOSPITAL_COMMUNITY)
Admission: RE | Admit: 2021-02-15 | Discharge: 2021-02-15 | Disposition: A | Discharge status: Home / Self Care | Payer: Medicaid Other | Source: Ambulatory Visit | Attending: Cardiology | Admitting: Cardiology

## 2021-02-15 ENCOUNTER — Other Ambulatory Visit: Payer: Self-pay

## 2021-02-15 DIAGNOSIS — E785 Hyperlipidemia, unspecified: Secondary | ICD-10-CM | POA: Diagnosis not present

## 2021-02-15 DIAGNOSIS — Z79899 Other long term (current) drug therapy: Secondary | ICD-10-CM | POA: Diagnosis not present

## 2021-02-15 DIAGNOSIS — I130 Hypertensive heart and chronic kidney disease with heart failure and stage 1 through stage 4 chronic kidney disease, or unspecified chronic kidney disease: Secondary | ICD-10-CM | POA: Diagnosis not present

## 2021-02-15 DIAGNOSIS — N183 Chronic kidney disease, stage 3 unspecified: Secondary | ICD-10-CM | POA: Diagnosis not present

## 2021-02-15 DIAGNOSIS — Z7901 Long term (current) use of anticoagulants: Secondary | ICD-10-CM | POA: Diagnosis not present

## 2021-02-15 DIAGNOSIS — I2510 Atherosclerotic heart disease of native coronary artery without angina pectoris: Secondary | ICD-10-CM | POA: Diagnosis not present

## 2021-02-15 DIAGNOSIS — I255 Ischemic cardiomyopathy: Secondary | ICD-10-CM | POA: Insufficient documentation

## 2021-02-15 DIAGNOSIS — I5022 Chronic systolic (congestive) heart failure: Secondary | ICD-10-CM | POA: Diagnosis not present

## 2021-02-15 DIAGNOSIS — R0602 Shortness of breath: Secondary | ICD-10-CM | POA: Insufficient documentation

## 2021-02-15 DIAGNOSIS — R42 Dizziness and giddiness: Secondary | ICD-10-CM | POA: Insufficient documentation

## 2021-02-15 DIAGNOSIS — I5042 Chronic combined systolic (congestive) and diastolic (congestive) heart failure: Secondary | ICD-10-CM

## 2021-02-15 DIAGNOSIS — F17210 Nicotine dependence, cigarettes, uncomplicated: Secondary | ICD-10-CM | POA: Insufficient documentation

## 2021-02-15 MED ORDER — CARVEDILOL 12.5 MG PO TABS
18.7500 mg | ORAL_TABLET | Freq: Two times a day (BID) | ORAL | 5 refills | Status: DC
  Filled 2021-02-15: qty 90, 30d supply, fill #0

## 2021-02-15 MED ORDER — ROSUVASTATIN CALCIUM 20 MG PO TABS
20.0000 mg | ORAL_TABLET | Freq: Every day | ORAL | 5 refills | Status: DC
  Filled 2021-02-15: qty 30, 30d supply, fill #0

## 2021-02-15 NOTE — Patient Instructions (Addendum)
It was a pleasure seeing you today!  MEDICATIONS: -We are changing your medications today -Increase carvedilol to 18.75 mg (1.5 tablets) twice daily. -Stop atorvastatin. Start rosuvastatin 20 mg (1 tablet) daily.  -Call if you have questions about your medications.   NEXT APPOINTMENT: Return to clinic in 3 weeks with APP Clinic.  In general, to take care of your heart failure: -Limit your fluid intake to 2 Liters (half-gallon) per day.   -Limit your salt intake to ideally 2-3 grams (2000-3000 mg) per day. -Weigh yourself daily and record, and bring that "weight diary" to your next appointment.  (Weight gain of 2-3 pounds in 1 day typically means fluid weight.) -The medications for your heart are to help your heart and help you live longer.   -Please contact us before stopping any of your heart medications.  Call the clinic at [phone] with questions or to reschedule future appointments.

## 2021-02-15 NOTE — Progress Notes (Addendum)
HF Cardiology: Dr. [name]  HPI:   50 y.o. with history of CAD, ischemic CMP, and CKD stage 3 presented for follow up of CHF and CAD.  Patient is s/p CABG in 01/2015.  Most recent cath in 12/2015 showed occluded of seq SVG-D1/D2.  Echo in 05/2016 showed EF 40-45%, but echo in 02/2017 showed EF down to 20-25%.  Echo in 10/2019 with EF 30-35%.     Patient was admitted in 02/2017 with acute systolic CHF. He had been off his meds.  He was admitted again in 10/2019 with hypertensive urgency and CHF, again off meds.    Echo 12/2020 EF 25% with mild LV dilation, moderate LVH, mildly decreased RV systolic function with mild RV enlargement, moderate MR, moderate TR, PASP 46.    Recently seen in HF Clinic by Dr. [name] 12/2020 and had been out of his meds. BP was elevated and he was mildly fluid overloaded. Meds were restarted. Returned for follow up 01/18/2021.   Recently presented to HF Clinic on 01/18/21. BP was improved, 122/98. He took his AM meds but had not taken mid day dose of hydralazine. Reported improved compliance. Tolerating medications ok but noted occasional dizziness, but no syncope/ near syncope. NYHA Class II. Denied orthopnea/PND. No LEE. Denied CP. He has [place], which does not cover medical management in [place]. He has the needed paperwork for [facility] Medicaid but had not yet filled it out. He plans to work on this.    Today he returns to HF clinic for pharmacist medication titration. At last visit with APP, no changes were made to his therapy due to positional dizziness. Patient reports adhering to medication regimen and taking all morning medications this morning about 30 minutes prior to visit. He denies missed doses. Of note, medications were last filled on 12/29/2020, but patient reported still having some medication on hand. Refills were initiated for patient, and patient was educated about calling pharmacy for future refills. He reports feeling good overall. Still has headaches, but is not  taking anything for them. He only checks his BP at home when headaches are really bad--last time was about 6 months ago and SBP was around 270 per his report. He endorses dizziness sometimes, but reports that it is improving. He says it only lasts about 5 minutes after taking the medications. No CP. He is able to go to the gym and workout with no DOE; however, he reports occasionally experiencing SOB when carrying bags of groceries into the house. He occasionally weighs himself at home. Reports weight this morning as 224 lbs. Takes torsemide 80 mg QAM and 60 mg QPM. Has not needed any extra torsemide doses. No LEE, PND or orthopnea. His diet has improved. He has decreased fried and fast foods. When at home eats "fleshy fruit" for breakfast, mostly chicken, [place], and vegetables; limits beef and no pork. Admits to dietary indiscretion when on vacation.   Has patient been experiencing any side effects to the medications prescribed? Yes: headache from isosorbide   Does the patient have any problems obtaining medication due to transportation or finances? No insurance. Receives HF medications through HF fund at [facility].  HF Medications: Carvedilol 12.5 mg BID Spironolactone 25 mg daily Hydralazine 100 mg TID + isosorbide mononitrate 15 mg daily Torsemide 80 mg QAM and 60 mg QPM  Understanding of regimen: fair Understanding of indications: poor Potential of compliance: poor Patient understands to avoid NSAIDs. Patient understands to avoid decongestants.    Pertinent Lab Values: 01/18/2021 Serum  creatinine 2.58 (baseline), BUN 24 (baseline), Potassium 4.1, Sodium 133  Vital Signs: Weight: 224.4 lbs (last clinic weight: 225.4 lbs) Blood pressure: 146/118  Heart rate: 70   Assessment/Plan: 1. CAD: S/p CABG 01/2015.  Seq SVG-D1/D2 known to be occluded. Denies anginal symptomatology  - Continue ASA 81 daily.  - Stop atorvastatin 80 mg daily (did not tolerate previously with CK>2500).  Start  rosuvastatin 20 mg daily 2. Smoking: continues to smoke, <1ppd. Encouraged to quit. He was unable to tolerate Chantix in the past due to bad dreams and does not want Wellbutrin.  3. HTN: Uncontrolled today  - Increase carvedilol to 18.75 mg BID - Continue amlodipine 10 mg daily - Continue hydralazine 100 mg TID  - Continue isosorbide mononitrate 15 mg daily  - Continue spironolactone 25 mg daily - Would not start clonidine back yet.    4. CKD: Stage 3.   - BMP checked 01/18/2021, Scr stable at 2.58 5. Chronic systolic CHF: Ischemic cardiomyopathy though poorly controlled HTN likely plays a role as well. Echo 12/2020 with EF 25% and LVH.   -Stable NYHA Class II symptoms. Volume status ok on exam  - Continue torsemide 80 mg QAM and 60 mg QPM  - Increase carvedilol to 18.75 mg BID as above - Continue spironolactone 25 mg daily - Continue hydralazine 100 mg TID + hydralazine 15 mg daily (has hard time tolerating 30 mg daily due to headaches).  - H/x of angioedema with ACEI, so no ACEI or Entresto.  Probably safe to carefully try ARB in the future (consider next visit, not previously initiated given positional dizziness) - Reassess EF in the future with controlled BP on all his meds. ~3 months. If EF remains < 35%, he would be considered ICD candidate. However, needs to get [employer] insurance first.  6. Hyperlipidemia: LDL markedly elevated in 09/2020, but he had been off statin. Had been on atorvastatin, but it was stopped in 2019 due to elevated CK >2500. He was referred to lipid clinic for PCSK9i, but unable to afford due to high cost. Atorvastatin was restarted 12/2020.  - Stop atorvastatin 80 mg daily. Start rosuvastatin 20 mg daily    Follow up with APP on 03/08/2021   Audry Riles, PharmD, BCPS, BCCP, CPP Heart Failure Clinic Pharmacist [phone]

## 2021-03-07 ENCOUNTER — Telehealth (HOSPITAL_COMMUNITY): Payer: Self-pay

## 2021-03-07 NOTE — Telephone Encounter (Signed)
Called to confirm/remind patient of their appointment at the [facility] on 03/08/21.   Patient reminded to bring all medications and/or complete list.  Confirmed patient has transportation. Gave directions, instructed to utilize [address] parking.  Confirmed appointment prior to ending call.

## 2021-03-08 ENCOUNTER — Other Ambulatory Visit: Payer: Self-pay

## 2021-03-08 ENCOUNTER — Ambulatory Visit (HOSPITAL_COMMUNITY)
Admission: RE | Admit: 2021-03-08 | Discharge: 2021-03-08 | Disposition: A | Discharge status: Home / Self Care | Payer: Medicaid Other | Source: Ambulatory Visit | Attending: Cardiology | Admitting: Cardiology

## 2021-03-08 ENCOUNTER — Encounter (HOSPITAL_COMMUNITY): Payer: Self-pay

## 2021-03-08 VITALS — BP 99/66 | HR 64 | Wt 233.2 lb

## 2021-03-08 DIAGNOSIS — Z888 Allergy status to other drugs, medicaments and biological substances status: Secondary | ICD-10-CM | POA: Diagnosis not present

## 2021-03-08 DIAGNOSIS — F17210 Nicotine dependence, cigarettes, uncomplicated: Secondary | ICD-10-CM | POA: Insufficient documentation

## 2021-03-08 DIAGNOSIS — Z951 Presence of aortocoronary bypass graft: Secondary | ICD-10-CM | POA: Diagnosis not present

## 2021-03-08 DIAGNOSIS — Z7982 Long term (current) use of aspirin: Secondary | ICD-10-CM | POA: Insufficient documentation

## 2021-03-08 DIAGNOSIS — I5022 Chronic systolic (congestive) heart failure: Secondary | ICD-10-CM | POA: Insufficient documentation

## 2021-03-08 DIAGNOSIS — Z79899 Other long term (current) drug therapy: Secondary | ICD-10-CM | POA: Diagnosis not present

## 2021-03-08 DIAGNOSIS — Z720 Tobacco use: Secondary | ICD-10-CM

## 2021-03-08 DIAGNOSIS — Z8249 Family history of ischemic heart disease and other diseases of the circulatory system: Secondary | ICD-10-CM | POA: Insufficient documentation

## 2021-03-08 DIAGNOSIS — N1832 Chronic kidney disease, stage 3b: Secondary | ICD-10-CM

## 2021-03-08 DIAGNOSIS — I10 Essential (primary) hypertension: Secondary | ICD-10-CM | POA: Diagnosis not present

## 2021-03-08 DIAGNOSIS — I130 Hypertensive heart and chronic kidney disease with heart failure and stage 1 through stage 4 chronic kidney disease, or unspecified chronic kidney disease: Secondary | ICD-10-CM | POA: Diagnosis present

## 2021-03-08 DIAGNOSIS — N183 Chronic kidney disease, stage 3 unspecified: Secondary | ICD-10-CM | POA: Diagnosis not present

## 2021-03-08 DIAGNOSIS — I2510 Atherosclerotic heart disease of native coronary artery without angina pectoris: Secondary | ICD-10-CM | POA: Diagnosis not present

## 2021-03-08 DIAGNOSIS — E785 Hyperlipidemia, unspecified: Secondary | ICD-10-CM

## 2021-03-08 DIAGNOSIS — I5042 Chronic combined systolic (congestive) and diastolic (congestive) heart failure: Secondary | ICD-10-CM | POA: Diagnosis not present
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
## 2021-03-08 NOTE — Patient Instructions (Signed)
Thank you for visiting in our clinic today.  Please keep your appointment as scheduled.  Do the following things EVERYDAY: Weigh yourself in the morning before breakfast. Write it down and keep it in a log. Take your medicines as prescribed Eat low salt foods--Limit salt (sodium) to 2000 mg per day.  Stay as active as you can everyday Limit all fluids for the day to less than 2 liters   If you have any questions or concerns before your next appointment please send [place] a message through [place] or call our office at [phone].    TO LEAVE A MESSAGE FOR THE NURSE SELECT OPTION 2, PLEASE LEAVE A MESSAGE INCLUDING: YOUR NAME DATE OF BIRTH CALL BACK NUMBER REASON FOR CALL**this is important as we prioritize the call backs  YOU WILL RECEIVE A CALL BACK THE SAME DAY AS LONG AS YOU CALL BEFORE 4:00 PM

## 2021-04-12 ENCOUNTER — Encounter (HOSPITAL_COMMUNITY): Payer: Medicaid Other | Admitting: Internal Medicine

## 2021-05-03 ENCOUNTER — Other Ambulatory Visit: Payer: Self-pay

## 2021-05-03 ENCOUNTER — Telehealth: Payer: Self-pay | Admitting: Licensed Clinical Social Worker

## 2021-05-03 ENCOUNTER — Ambulatory Visit (HOSPITAL_COMMUNITY)
Admission: RE | Admit: 2021-05-03 | Discharge: 2021-05-03 | Disposition: A | Discharge status: Home / Self Care | Payer: Medicaid Other | Source: Ambulatory Visit | Attending: Cardiology | Admitting: Cardiology

## 2021-05-03 ENCOUNTER — Other Ambulatory Visit (HOSPITAL_COMMUNITY): Payer: Self-pay

## 2021-05-03 VITALS — BP 140/82 | HR 78 | Wt 234.0 lb

## 2021-05-03 DIAGNOSIS — Z79899 Other long term (current) drug therapy: Secondary | ICD-10-CM | POA: Diagnosis not present

## 2021-05-03 DIAGNOSIS — E785 Hyperlipidemia, unspecified: Secondary | ICD-10-CM | POA: Insufficient documentation

## 2021-05-03 DIAGNOSIS — Z7982 Long term (current) use of aspirin: Secondary | ICD-10-CM | POA: Insufficient documentation

## 2021-05-03 DIAGNOSIS — I255 Ischemic cardiomyopathy: Secondary | ICD-10-CM | POA: Diagnosis not present

## 2021-05-03 DIAGNOSIS — I5042 Chronic combined systolic (congestive) and diastolic (congestive) heart failure: Secondary | ICD-10-CM

## 2021-05-03 DIAGNOSIS — Z7901 Long term (current) use of anticoagulants: Secondary | ICD-10-CM | POA: Diagnosis not present

## 2021-05-03 DIAGNOSIS — I2510 Atherosclerotic heart disease of native coronary artery without angina pectoris: Secondary | ICD-10-CM | POA: Insufficient documentation

## 2021-05-03 DIAGNOSIS — N183 Chronic kidney disease, stage 3 unspecified: Secondary | ICD-10-CM | POA: Insufficient documentation

## 2021-05-03 DIAGNOSIS — F17210 Nicotine dependence, cigarettes, uncomplicated: Secondary | ICD-10-CM | POA: Diagnosis not present

## 2021-05-03 DIAGNOSIS — I130 Hypertensive heart and chronic kidney disease with heart failure and stage 1 through stage 4 chronic kidney disease, or unspecified chronic kidney disease: Secondary | ICD-10-CM | POA: Diagnosis not present

## 2021-05-03 DIAGNOSIS — Z951 Presence of aortocoronary bypass graft: Secondary | ICD-10-CM | POA: Diagnosis not present

## 2021-05-03 LAB — BASIC METABOLIC PANEL
Anion gap: 11 (ref 5–15)
BUN: 25 mg/dL — ABNORMAL HIGH (ref 6–20)
CO2: 22 mmol/L (ref 22–32)
Calcium: 9.3 mg/dL (ref 8.9–10.3)
Chloride: 100 mmol/L (ref 98–111)
Creatinine, Ser: 2.6 mg/dL — ABNORMAL HIGH (ref 0.61–1.24)
GFR, Estimated: 29 mL/min — ABNORMAL LOW (ref >60)
Glucose, Bld: 111 mg/dL — ABNORMAL HIGH (ref 70–99)
Potassium: 2.9 mmol/L — ABNORMAL LOW (ref 3.5–5.1)
Sodium: 133 mmol/L — ABNORMAL LOW (ref 135–145)

## 2021-05-03 LAB — LIPID PANEL
Cholesterol: 273 mg/dL — ABNORMAL HIGH (ref 0–200)
HDL: 46 mg/dL (ref >40)
LDL Cholesterol: 199 mg/dL — ABNORMAL HIGH (ref 0–99)
Total CHOL/HDL Ratio: 5.9 RATIO
Triglycerides: 139 mg/dL (ref <150)
VLDL: 28 mg/dL (ref 0–40)

## 2021-05-03 LAB — CK: Total CK: 1624 U/L — ABNORMAL HIGH (ref 49–397)

## 2021-05-03 MED ORDER — HYDRALAZINE HCL 100 MG PO TABS
100.0000 mg | ORAL_TABLET | Freq: Three times a day (TID) | ORAL | 3 refills | Status: DC
  Filled 2021-05-03: qty 90, 30d supply, fill #0
  Filled 2021-10-20: qty 90, 30d supply, fill #1

## 2021-05-03 MED ORDER — SPIRONOLACTONE 25 MG PO TABS
25.0000 mg | ORAL_TABLET | Freq: Every day | ORAL | 3 refills | Status: DC
  Filled 2021-05-03: qty 30, 30d supply, fill #0
  Filled 2021-10-20: qty 30, 30d supply, fill #1

## 2021-05-03 MED ORDER — ROSUVASTATIN CALCIUM 20 MG PO TABS
20.0000 mg | ORAL_TABLET | Freq: Every day | ORAL | 3 refills | Status: DC
  Filled 2021-05-03: qty 30, 30d supply, fill #0
  Filled 2021-10-20: qty 30, 30d supply, fill #1

## 2021-05-03 MED ORDER — POTASSIUM CHLORIDE CRYS ER 20 MEQ PO TBCR
60.0000 meq | EXTENDED_RELEASE_TABLET | Freq: Two times a day (BID) | ORAL | 3 refills | Status: DC
  Filled 2021-05-03: qty 180, 30d supply, fill #0

## 2021-05-03 MED ORDER — TORSEMIDE 20 MG PO TABS
ORAL_TABLET | ORAL | 3 refills | Status: DC
  Filled 2021-05-03: qty 210, 30d supply, fill #0
  Filled 2021-07-11 – 2021-08-04 (×2): qty 210, 30d supply, fill #1
  Filled 2021-09-12: qty 210, 30d supply, fill #2

## 2021-05-03 MED ORDER — CARVEDILOL 25 MG PO TABS
25.0000 mg | ORAL_TABLET | Freq: Two times a day (BID) | ORAL | 3 refills | Status: DC
  Filled 2021-05-03: qty 60, 30d supply, fill #0
  Filled 2021-08-04: qty 60, 30d supply, fill #1
  Filled 2021-10-20: qty 60, 30d supply, fill #2
  Filled 2022-04-07: qty 60, 30d supply, fill #3

## 2021-05-03 MED ORDER — ISOSORBIDE MONONITRATE ER 30 MG PO TB24
15.0000 mg | ORAL_TABLET | Freq: Every day | ORAL | 3 refills | Status: DC
  Filled 2021-05-03: qty 15, 30d supply, fill #0
  Filled 2021-10-20: qty 15, 30d supply, fill #1
  Filled 2022-01-03: qty 15, 30d supply, fill #2
  Filled 2022-03-07: qty 15, 30d supply, fill #3

## 2021-05-03 MED ORDER — AMLODIPINE BESYLATE 10 MG PO TABS
10.0000 mg | ORAL_TABLET | Freq: Every day | ORAL | 3 refills | Status: DC
  Filled 2021-05-03: qty 30, 30d supply, fill #0
  Filled 2021-09-13 – 2021-10-20 (×2): qty 30, 30d supply, fill #1

## 2021-05-03 NOTE — Patient Instructions (Signed)
Increase Carvedilol to 25 mg Twice daily   Refills of all medications have been sent to [place] Work team will contact you this week regarding your Medicaid  Labs done today, we will call you with abnormal results  Your physician recommends that you schedule a follow-up appointment in: 2 months  If you have any questions or concerns before your next appointment please send [place] a message through [place] or call our office at [phone].    TO LEAVE A MESSAGE FOR THE NURSE SELECT OPTION 2, PLEASE LEAVE A MESSAGE INCLUDING: YOUR NAME DATE OF BIRTH CALL BACK NUMBER REASON FOR CALL**this is important as we prioritize the call backs  YOU WILL RECEIVE A CALL BACK THE SAME DAY AS LONG AS YOU CALL BEFORE 4:00 PM  At the [facility], you and your health needs are our priority. As part of our continuing mission to provide you with exceptional heart care, we have created designated Provider Care Teams. These Care Teams include your primary Cardiologist (physician) and Advanced Practice Providers (APPs- Physician Assistants and Nurse Practitioners) who all work together to provide you with the care you need, when you need it.   You may see any of the following providers on your designated Care Team at your next follow up: Dr Glori Bickers Dr Haynes Kerns, NP Lyda Jester, [place] [facility] [place], [place] Audry Riles, PharmD   Please be sure to bring in all your medications bottles to every appointment.
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
## 2021-05-03 NOTE — Telephone Encounter (Signed)
LCSW called to speak with pt regarding his Medicaid as it is out of state, appears he has been utilizing Out of State Medicaid since 2019. In order to change his coverage to in state Medicaid he will need to report his move to [place] to the county DSS office in [place] where his Medicaid was established. Once he has let them know of his move then he will need to apply at his local DSS in [place].   No answer at [phone]. Will route this to HF CSW who has previously engaged w/ pt.   Westley Hummer, MSW, [place]  [phone]- work cell phone (preferred) [phone]- desk phone

## 2021-05-04 ENCOUNTER — Telehealth (HOSPITAL_COMMUNITY): Payer: Self-pay | Admitting: Licensed Clinical Social Worker

## 2021-05-04 ENCOUNTER — Telehealth (HOSPITAL_COMMUNITY): Payer: Self-pay

## 2021-05-04 DIAGNOSIS — I5042 Chronic combined systolic (congestive) and diastolic (congestive) heart failure: Secondary | ICD-10-CM

## 2021-05-04 NOTE — Telephone Encounter (Signed)
-----   Message from Larey Dresser, MD sent at 05/03/2021  4:34 PM EST ----- 1. He is supposed to be taking KCl 60 mEq bid, it looks to me like he is not doing this.  If not, he needs to start it.  If he is taking it, let me know and we will come up with a different plan. BMET 10 days after starting back on KCl 60 bid.  2. CK elevated, stop Crestor.  He will need a non-statin cholesterol medication, LDL is very high.  Please refer to [place] clinic to see if he can get on a patient assistance program for PCSK9 inhibitor, Leqvio, etc.

## 2021-05-04 NOTE — Telephone Encounter (Signed)
Pt aware, agreeable, and verbalized understanding Lab appointment made for 05/18/2021 @ 1.45pm

## 2021-05-04 NOTE — Telephone Encounter (Signed)
CSW consulted to speak with pt about lack of insurance at this time.  Pt has [place] and has for years- has been told on multiple occasions he needs to switch Medicaid to [place] but he has reported he was unsure if he is staying in the area in the past.  CSW called pt who reports he has started the process and informed [place] that he is no longer in the state and does not plan to return but has no initiated anything in [place].  Reports he has put it off until he gets his address straight- pt unable to clarify what he means by this but sounds as if he has not had a stable address- currently living in a hotel.  CSW encouraged pt to call or go to [facility] and speak with a case worker to discuss what kind of proof he needs that he is a resident since that appears to be his concern- pt will plan to call them tomorrow to discuss coming in to complete an application.  Will continue to follow and assist as needed  [name], [facility] Desk#: [phone] Cell#: [phone]

## 2021-05-18 ENCOUNTER — Other Ambulatory Visit (HOSPITAL_COMMUNITY): Payer: Medicaid Other
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
## 2021-07-01 ENCOUNTER — Telehealth (HOSPITAL_COMMUNITY): Payer: Self-pay

## 2021-07-01 NOTE — Telephone Encounter (Signed)
Called and left patient a detailed voice message to confirm/remind patient of their appointment at the [facility] on 07/04/21.

## 2021-07-04 ENCOUNTER — Encounter (HOSPITAL_COMMUNITY): Payer: Medicaid Other

## 2021-07-11 ENCOUNTER — Other Ambulatory Visit (HOSPITAL_COMMUNITY): Payer: Self-pay

## 2021-07-19 ENCOUNTER — Other Ambulatory Visit (HOSPITAL_COMMUNITY): Payer: Self-pay

## 2021-08-04 ENCOUNTER — Other Ambulatory Visit (HOSPITAL_COMMUNITY): Payer: Self-pay

## 2021-09-12 ENCOUNTER — Other Ambulatory Visit (HOSPITAL_COMMUNITY): Payer: Self-pay

## 2021-09-13 ENCOUNTER — Other Ambulatory Visit (HOSPITAL_COMMUNITY): Payer: Self-pay

## 2021-09-14 DIAGNOSIS — H5213 Myopia, bilateral: Secondary | ICD-10-CM | POA: Diagnosis not present

## 2021-09-21 ENCOUNTER — Other Ambulatory Visit (HOSPITAL_COMMUNITY): Payer: Self-pay

## 2021-09-22 ENCOUNTER — Telehealth (HOSPITAL_COMMUNITY): Payer: Self-pay

## 2021-09-22 NOTE — Telephone Encounter (Signed)
Called and left patient a detailed message to confirm/remind patient of their appointment at the [facility] on 09/23/21.

## 2021-09-23 ENCOUNTER — Ambulatory Visit (HOSPITAL_COMMUNITY)
Admission: RE | Admit: 2021-09-23 | Discharge: 2021-09-23 | Disposition: A | Discharge status: Home / Self Care | Payer: Medicaid Other | Source: Ambulatory Visit | Attending: Family Medicine | Admitting: Family Medicine

## 2021-09-23 ENCOUNTER — Encounter (HOSPITAL_COMMUNITY): Payer: Self-pay

## 2021-09-23 VITALS — BP 136/102 | HR 68 | Wt 240.0 lb

## 2021-09-23 DIAGNOSIS — N1832 Chronic kidney disease, stage 3b: Secondary | ICD-10-CM

## 2021-09-23 DIAGNOSIS — E785 Hyperlipidemia, unspecified: Secondary | ICD-10-CM | POA: Insufficient documentation

## 2021-09-23 DIAGNOSIS — I2510 Atherosclerotic heart disease of native coronary artery without angina pectoris: Secondary | ICD-10-CM

## 2021-09-23 DIAGNOSIS — Z8249 Family history of ischemic heart disease and other diseases of the circulatory system: Secondary | ICD-10-CM | POA: Diagnosis not present

## 2021-09-23 DIAGNOSIS — Z7982 Long term (current) use of aspirin: Secondary | ICD-10-CM | POA: Diagnosis not present

## 2021-09-23 DIAGNOSIS — I130 Hypertensive heart and chronic kidney disease with heart failure and stage 1 through stage 4 chronic kidney disease, or unspecified chronic kidney disease: Secondary | ICD-10-CM | POA: Insufficient documentation

## 2021-09-23 DIAGNOSIS — Z139 Encounter for screening, unspecified: Secondary | ICD-10-CM | POA: Diagnosis not present

## 2021-09-23 DIAGNOSIS — N183 Chronic kidney disease, stage 3 unspecified: Secondary | ICD-10-CM | POA: Diagnosis not present

## 2021-09-23 DIAGNOSIS — I255 Ischemic cardiomyopathy: Secondary | ICD-10-CM | POA: Diagnosis not present

## 2021-09-23 DIAGNOSIS — I10 Essential (primary) hypertension: Secondary | ICD-10-CM

## 2021-09-23 DIAGNOSIS — Z720 Tobacco use: Secondary | ICD-10-CM | POA: Diagnosis not present

## 2021-09-23 DIAGNOSIS — Z79899 Other long term (current) drug therapy: Secondary | ICD-10-CM | POA: Diagnosis not present

## 2021-09-23 DIAGNOSIS — F17210 Nicotine dependence, cigarettes, uncomplicated: Secondary | ICD-10-CM | POA: Diagnosis not present

## 2021-09-23 DIAGNOSIS — I5022 Chronic systolic (congestive) heart failure: Secondary | ICD-10-CM | POA: Diagnosis not present

## 2021-09-23 LAB — BASIC METABOLIC PANEL
Anion gap: 11 (ref 5–15)
BUN: 18 mg/dL (ref 6–20)
CO2: 25 mmol/L (ref 22–32)
Calcium: 9 mg/dL (ref 8.9–10.3)
Chloride: 102 mmol/L (ref 98–111)
Creatinine, Ser: 2.22 mg/dL — ABNORMAL HIGH (ref 0.61–1.24)
GFR, Estimated: 35 mL/min — ABNORMAL LOW (ref >60)
Glucose, Bld: 98 mg/dL (ref 70–99)
Potassium: 3.1 mmol/L — ABNORMAL LOW (ref 3.5–5.1)
Sodium: 138 mmol/L (ref 135–145)

## 2021-09-23 LAB — BRAIN NATRIURETIC PEPTIDE: B Natriuretic Peptide: 481.5 pg/mL — ABNORMAL HIGH (ref 0.0–100.0)

## 2021-09-23 LAB — LIPID PANEL
Cholesterol: 250 mg/dL — ABNORMAL HIGH (ref 0–200)
HDL: 40 mg/dL — ABNORMAL LOW (ref >40)
LDL Cholesterol: 178 mg/dL — ABNORMAL HIGH (ref 0–99)
Total CHOL/HDL Ratio: 6.3 RATIO
Triglycerides: 159 mg/dL — ABNORMAL HIGH (ref <150)
VLDL: 32 mg/dL (ref 0–40)

## 2021-09-23 NOTE — Patient Instructions (Addendum)
EKG done today.  Labs done today. We will contact you only if your labs are abnormal.  No medication changes were made. Please continue all current medications as prescribed.  You have been referred to The lipid clinic. They will contact you to schedule an appointment.  Your physician recommends that you schedule a follow-up appointment in: 6 weeks with an echo prior to your exam.  Your physician has requested that you have an echocardiogram. Echocardiography is a painless test that uses sound waves to create images of your heart. It provides your doctor with information about the size and shape of your heart and how well your heart's chambers and valves are working. This procedure takes approximately one hour. There are no restrictions for this procedure.  If you have any questions or concerns before your next appointment please send [place] a message through [place] or call our office at [phone].    TO LEAVE A MESSAGE FOR THE NURSE SELECT OPTION 2, PLEASE LEAVE A MESSAGE INCLUDING: YOUR NAME DATE OF BIRTH CALL BACK NUMBER REASON FOR CALL**this is important as we prioritize the call backs  YOU WILL RECEIVE A CALL BACK THE SAME DAY AS LONG AS YOU CALL BEFORE 4:00 PM   Do the following things EVERYDAY: Weigh yourself in the morning before breakfast. Write it down and keep it in a log. Take your medicines as prescribed Eat low salt foods--Limit salt (sodium) to 2000 mg per day.  Stay as active as you can everyday Limit all fluids for the day to less than 2 liters   At the [facility], you and your health needs are our priority. As part of our continuing mission to provide you with exceptional heart care, we have created designated Provider Care Teams. These Care Teams include your primary Cardiologist (physician) and Advanced Practice Providers (APPs- Physician Assistants and Nurse Practitioners) who all work together to provide you with the care you need, when you need  it.   You may see any of the following providers on your designated Care Team at your next follow up: Dr Glori Bickers Dr Haynes Kerns, NP Lyda Jester, [place] Audry Riles, PharmD   Please be sure to bring in all your medications bottles to every appointment.
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
## 2021-09-27 ENCOUNTER — Telehealth (HOSPITAL_COMMUNITY): Payer: Self-pay | Admitting: Licensed Clinical Social Worker

## 2021-09-27 NOTE — Telephone Encounter (Signed)
CSW referred to contact patient to assist with housing. CSW contacted patient who states he was told to change his disability benefits to [place] from [place] and reports his monthly check was cut by $400. Patient asked if this CSW was the person who told him to make the change. The conversation quickly escalated with the patient yelling in the phone stating he was angry because "now I am living in my car cause I can't afford to live at the hotel". Patient explained that he used a  [place] friends address to get his benefits transferred from [place] and because he was not paying rent at the home Social Security cut his benefits as they consider it free housing therefore income. Patient reports he was living in a hotel paying a monthly fee and can no longer afford it with the cut in his check. Patient shared that he was told that he could provide the documentation from the hotel to prove he was living there and paying rent. CSW encouraged patient to provide the documentation to [place] from hotel and to follow up with the [facility] to utilize their address as many homeless are able to do for benefits/mailing address. Patient was irate with CSW due to loss of benefits and his current situation. CSW ended the call due to disrespectful tone and language. Raquel Sarna, [place], [place]

## 2021-09-28 ENCOUNTER — Telehealth (HOSPITAL_COMMUNITY): Payer: Self-pay | Admitting: *Deleted

## 2021-09-28 ENCOUNTER — Other Ambulatory Visit (HOSPITAL_COMMUNITY): Payer: Self-pay

## 2021-09-28 DIAGNOSIS — I5022 Chronic systolic (congestive) heart failure: Secondary | ICD-10-CM

## 2021-09-28 DIAGNOSIS — I2510 Atherosclerotic heart disease of native coronary artery without angina pectoris: Secondary | ICD-10-CM

## 2021-09-28 MED ORDER — POTASSIUM CHLORIDE CRYS ER 20 MEQ PO TBCR
40.0000 meq | EXTENDED_RELEASE_TABLET | Freq: Two times a day (BID) | ORAL | 3 refills | Status: DC
  Filled 2021-09-28: qty 60, 15d supply, fill #0
  Filled 2021-10-20: qty 60, 15d supply, fill #1

## 2021-09-28 NOTE — Telephone Encounter (Signed)
Called and spoke with patient regarding low K. Reports he is suppose to take K 60 meq BID, but is not doing so. Per Allena Katz NP instructed to take K 40 meq BID consistently to avoid further drop in K, prevent arrythmias, etc. Scheduled for repeat BMET 10/06/21. Order placed. Pt verbalized understanding to the above medication change and lab appt info.  Scott Monte RN [place] Coordinator  Office: [phone]  24/7 Pager: [phone]

## 2021-09-28 NOTE — Addendum Note (Signed)
Addended by: Emerson Monte B on: 09/28/2021 10:42 AM   Modules accepted: Orders

## 2021-10-06 ENCOUNTER — Ambulatory Visit (HOSPITAL_COMMUNITY)
Admission: RE | Admit: 2021-10-06 | Discharge: 2021-10-06 | Disposition: A | Discharge status: Home / Self Care | Payer: Medicaid Other | Source: Ambulatory Visit | Attending: Internal Medicine | Admitting: Internal Medicine

## 2021-10-06 DIAGNOSIS — I5022 Chronic systolic (congestive) heart failure: Secondary | ICD-10-CM

## 2021-10-06 LAB — BASIC METABOLIC PANEL
Anion gap: 7 (ref 5–15)
BUN: 21 mg/dL — ABNORMAL HIGH (ref 6–20)
CO2: 23 mmol/L (ref 22–32)
Calcium: 9.2 mg/dL (ref 8.9–10.3)
Chloride: 105 mmol/L (ref 98–111)
Creatinine, Ser: 2.48 mg/dL — ABNORMAL HIGH (ref 0.61–1.24)
GFR, Estimated: 31 mL/min — ABNORMAL LOW (ref >60)
Glucose, Bld: 112 mg/dL — ABNORMAL HIGH (ref 70–99)
Potassium: 4.3 mmol/L (ref 3.5–5.1)
Sodium: 135 mmol/L (ref 135–145)

## 2021-10-18 DIAGNOSIS — H5213 Myopia, bilateral: Secondary | ICD-10-CM | POA: Diagnosis not present

## 2021-10-20 ENCOUNTER — Other Ambulatory Visit (HOSPITAL_COMMUNITY): Payer: Self-pay | Admitting: *Deleted

## 2021-10-20 ENCOUNTER — Other Ambulatory Visit (HOSPITAL_COMMUNITY): Payer: Self-pay

## 2021-10-20 MED ORDER — TORSEMIDE 20 MG PO TABS: 80.0000 mg | ORAL_TABLET | Freq: Two times a day (BID) | ORAL | 3 refills | Status: DC

## 2021-10-21 ENCOUNTER — Other Ambulatory Visit (HOSPITAL_COMMUNITY): Payer: Self-pay | Admitting: Cardiology

## 2021-10-21 ENCOUNTER — Other Ambulatory Visit (HOSPITAL_COMMUNITY): Payer: Self-pay

## 2021-10-21 DIAGNOSIS — I5042 Chronic combined systolic (congestive) and diastolic (congestive) heart failure: Secondary | ICD-10-CM

## 2021-10-21 MED ORDER — TORSEMIDE 20 MG PO TABS
80.0000 mg | ORAL_TABLET | Freq: Two times a day (BID) | ORAL | 3 refills | Status: DC
  Filled 2021-10-21: qty 240, 30d supply, fill #0
  Filled 2021-12-05: qty 240, 30d supply, fill #1
  Filled 2022-01-03: qty 240, 30d supply, fill #2
  Filled 2022-03-05: qty 40, 5d supply, fill #3
  Filled 2022-03-06: qty 200, 25d supply, fill #3

## 2021-11-07 ENCOUNTER — Encounter (HOSPITAL_COMMUNITY): Payer: Self-pay | Admitting: Cardiology

## 2021-11-07 ENCOUNTER — Ambulatory Visit (HOSPITAL_BASED_OUTPATIENT_CLINIC_OR_DEPARTMENT_OTHER)
Admission: RE | Admit: 2021-11-07 | Discharge: 2021-11-07 | Disposition: A | Discharge status: Home / Self Care | Payer: Medicaid Other | Source: Ambulatory Visit | Attending: Cardiology | Admitting: Cardiology

## 2021-11-07 ENCOUNTER — Other Ambulatory Visit (HOSPITAL_COMMUNITY): Payer: Self-pay

## 2021-11-07 ENCOUNTER — Ambulatory Visit (HOSPITAL_COMMUNITY)
Admission: RE | Admit: 2021-11-07 | Discharge: 2021-11-07 | Disposition: A | Discharge status: Home / Self Care | Payer: Medicaid Other | Source: Ambulatory Visit | Attending: Cardiology | Admitting: Cardiology

## 2021-11-07 VITALS — BP 118/70 | HR 70 | Wt 240.2 lb

## 2021-11-07 DIAGNOSIS — I2510 Atherosclerotic heart disease of native coronary artery without angina pectoris: Secondary | ICD-10-CM | POA: Diagnosis not present

## 2021-11-07 DIAGNOSIS — Z7982 Long term (current) use of aspirin: Secondary | ICD-10-CM | POA: Diagnosis not present

## 2021-11-07 DIAGNOSIS — M7989 Other specified soft tissue disorders: Secondary | ICD-10-CM | POA: Diagnosis not present

## 2021-11-07 DIAGNOSIS — I083 Combined rheumatic disorders of mitral, aortic and tricuspid valves: Secondary | ICD-10-CM | POA: Diagnosis not present

## 2021-11-07 DIAGNOSIS — W3400XS Accidental discharge from unspecified firearms or gun, sequela: Secondary | ICD-10-CM | POA: Diagnosis not present

## 2021-11-07 DIAGNOSIS — I5022 Chronic systolic (congestive) heart failure: Secondary | ICD-10-CM | POA: Diagnosis not present

## 2021-11-07 DIAGNOSIS — Z79899 Other long term (current) drug therapy: Secondary | ICD-10-CM | POA: Insufficient documentation

## 2021-11-07 DIAGNOSIS — Z951 Presence of aortocoronary bypass graft: Secondary | ICD-10-CM | POA: Diagnosis not present

## 2021-11-07 DIAGNOSIS — E785 Hyperlipidemia, unspecified: Secondary | ICD-10-CM | POA: Diagnosis not present

## 2021-11-07 DIAGNOSIS — M25562 Pain in left knee: Secondary | ICD-10-CM | POA: Insufficient documentation

## 2021-11-07 DIAGNOSIS — I130 Hypertensive heart and chronic kidney disease with heart failure and stage 1 through stage 4 chronic kidney disease, or unspecified chronic kidney disease: Secondary | ICD-10-CM | POA: Diagnosis not present

## 2021-11-07 DIAGNOSIS — I255 Ischemic cardiomyopathy: Secondary | ICD-10-CM | POA: Insufficient documentation

## 2021-11-07 DIAGNOSIS — I5042 Chronic combined systolic (congestive) and diastolic (congestive) heart failure: Secondary | ICD-10-CM | POA: Diagnosis not present

## 2021-11-07 DIAGNOSIS — R748 Abnormal levels of other serum enzymes: Secondary | ICD-10-CM | POA: Insufficient documentation

## 2021-11-07 DIAGNOSIS — N183 Chronic kidney disease, stage 3 unspecified: Secondary | ICD-10-CM | POA: Insufficient documentation

## 2021-11-07 DIAGNOSIS — M25561 Pain in right knee: Secondary | ICD-10-CM | POA: Insufficient documentation

## 2021-11-07 DIAGNOSIS — F17210 Nicotine dependence, cigarettes, uncomplicated: Secondary | ICD-10-CM | POA: Insufficient documentation

## 2021-11-07 DIAGNOSIS — T148XXS Other injury of unspecified body region, sequela: Secondary | ICD-10-CM | POA: Insufficient documentation

## 2021-11-07 LAB — ECHOCARDIOGRAM COMPLETE
AR max vel: 2.71 cm2
AV Area VTI: 2.21 cm2
AV Area mean vel: 2.72 cm2
AV Mean grad: 3 mmHg
AV Peak grad: 4.8 mmHg
Ao pk vel: 1.1 m/s
Area-P 1/2: 4.15 cm2
Calc EF: 38.8 %
P 1/2 time: 522 msec
S' Lateral: 5.1 cm
Single Plane A2C EF: 38.3 %
Single Plane A4C EF: 37.8 %

## 2021-11-07 LAB — BASIC METABOLIC PANEL
Anion gap: 8 (ref 5–15)
BUN: 18 mg/dL (ref 6–20)
CO2: 22 mmol/L (ref 22–32)
Calcium: 9.2 mg/dL (ref 8.9–10.3)
Chloride: 106 mmol/L (ref 98–111)
Creatinine, Ser: 2.4 mg/dL — ABNORMAL HIGH (ref 0.61–1.24)
GFR, Estimated: 32 mL/min — ABNORMAL LOW (ref >60)
Glucose, Bld: 109 mg/dL — ABNORMAL HIGH (ref 70–99)
Potassium: 4.1 mmol/L (ref 3.5–5.1)
Sodium: 136 mmol/L (ref 135–145)

## 2021-11-07 LAB — BRAIN NATRIURETIC PEPTIDE: B Natriuretic Peptide: 623 pg/mL — ABNORMAL HIGH (ref 0.0–100.0)

## 2021-11-07 MED ORDER — SPIRONOLACTONE 25 MG PO TABS
12.5000 mg | ORAL_TABLET | Freq: Every day | ORAL | 3 refills | Status: DC
  Filled 2021-11-07: qty 15, 30d supply, fill #0
  Filled 2022-01-03: qty 15, 30d supply, fill #1

## 2021-11-07 NOTE — Progress Notes (Signed)
  Echocardiogram 2D Echocardiogram has been performed.  Scott Salinas F 11/07/2021, 2:56 PM

## 2021-11-07 NOTE — Patient Instructions (Addendum)
EKG done today.  Labs done today. We will contact you only if your labs are abnormal.  START Spironolactone 12.5mg  (1/2 tablet) by mouth daily.   No other medication changes were made. Please continue all current medications as prescribed.  You have been referred to [facility], Electrophysiology, Cardiac Rehab, [facility]. These offices will contact you to schedule an appointment.   Your physician recommends that you schedule a follow-up appointment in: 10 days for a lab only appointment and in 6 weeks with our NP/PA clinic here in our office.  If you have any questions or concerns before your next appointment please send [place] a message through [place] or call our office at [phone].    TO LEAVE A MESSAGE FOR THE NURSE SELECT OPTION 2, PLEASE LEAVE A MESSAGE INCLUDING: YOUR NAME DATE OF BIRTH CALL BACK NUMBER REASON FOR CALL**this is important as we prioritize the call backs  YOU WILL RECEIVE A CALL BACK THE SAME DAY AS LONG AS YOU CALL BEFORE 4:00 PM   Do the following things EVERYDAY: Weigh yourself in the morning before breakfast. Write it down and keep it in a log. Take your medicines as prescribed Eat low salt foods--Limit salt (sodium) to 2000 mg per day.  Stay as active as you can everyday Limit all fluids for the day to less than 2 liters   At the [facility], you and your health needs are our priority. As part of our continuing mission to provide you with exceptional heart care, we have created designated Provider Care Teams. These Care Teams include your primary Cardiologist (physician) and Advanced Practice Providers (APPs- Physician Assistants and Nurse Practitioners) who all work together to provide you with the care you need, when you need it.   You may see any of the following providers on your designated Care Team at your next follow up: Dr Glori Bickers Dr Haynes Kerns, NP Lyda Jester, [place] Audry Riles, PharmD   Please be sure to bring in all your medications bottles to every appointment.

## 2021-11-07 NOTE — Addendum Note (Signed)
Encounter addended by: Larey Dresser, MD on: 11/07/2021 9:55 PM  Actions taken: Level of Service modified
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
## 2021-11-09 ENCOUNTER — Other Ambulatory Visit (HOSPITAL_COMMUNITY): Payer: Self-pay

## 2021-11-10 ENCOUNTER — Ambulatory Visit (INDEPENDENT_AMBULATORY_CARE_PROVIDER_SITE_OTHER): Payer: Medicaid Other | Admitting: Orthopedic Surgery

## 2021-11-10 ENCOUNTER — Ambulatory Visit: Payer: Self-pay

## 2021-11-10 ENCOUNTER — Ambulatory Visit (INDEPENDENT_AMBULATORY_CARE_PROVIDER_SITE_OTHER): Payer: Medicaid Other

## 2021-11-10 ENCOUNTER — Encounter: Payer: Self-pay | Admitting: Orthopedic Surgery

## 2021-11-10 DIAGNOSIS — M25562 Pain in left knee: Secondary | ICD-10-CM

## 2021-11-10 DIAGNOSIS — G8929 Other chronic pain: Secondary | ICD-10-CM | POA: Diagnosis not present

## 2021-11-10 DIAGNOSIS — M25561 Pain in right knee: Secondary | ICD-10-CM
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
## 2021-11-11 LAB — CBC WITH DIFFERENTIAL/PLATELET
Absolute Monocytes: 726 cells/uL (ref 200–950)
Basophils Absolute: 28 cells/uL (ref 0–200)
Basophils Relative: 0.5 %
Eosinophils Absolute: 198 cells/uL (ref 15–500)
Eosinophils Relative: 3.6 %
HCT: 45.5 % (ref 38.5–50.0)
Hemoglobin: 15.4 g/dL (ref 13.2–17.1)
Lymphs Abs: 1799 cells/uL (ref 850–3900)
MCH: 31.2 pg (ref 27.0–33.0)
MCHC: 33.8 g/dL (ref 32.0–36.0)
MCV: 92.3 fL (ref 80.0–100.0)
MPV: 11 fL (ref 7.5–12.5)
Monocytes Relative: 13.2 %
Neutro Abs: 2750 cells/uL (ref 1500–7800)
Neutrophils Relative %: 50 %
Platelets: 235 10*3/uL (ref 140–400)
RBC: 4.93 10*6/uL (ref 4.20–5.80)
RDW: 15.7 % — ABNORMAL HIGH (ref 11.0–15.0)
Total Lymphocyte: 32.7 %
WBC: 5.5 10*3/uL (ref 3.8–10.8)

## 2021-11-11 LAB — SEDIMENTATION RATE: Sed Rate: 38 mm/h — ABNORMAL HIGH (ref 0–20)

## 2021-11-11 LAB — C-REACTIVE PROTEIN: CRP: 14.3 mg/L — ABNORMAL HIGH (ref <8.0)

## 2021-11-12 NOTE — Progress Notes (Signed)
Laboratory values slightly elevated for infection.  Can you have him come in next Friday afternoon to be aspirated by [facility] right knee sent for cell count and Gram stain and culture.  Thanks

## 2021-11-18 ENCOUNTER — Ambulatory Visit (INDEPENDENT_AMBULATORY_CARE_PROVIDER_SITE_OTHER): Payer: Medicaid Other | Admitting: Surgical

## 2021-11-18 DIAGNOSIS — M25561 Pain in right knee: Secondary | ICD-10-CM | POA: Diagnosis not present

## 2021-11-18 DIAGNOSIS — M25461 Effusion, right knee: Secondary | ICD-10-CM

## 2021-11-20 ENCOUNTER — Encounter: Payer: Self-pay | Admitting: Surgical

## 2021-11-20 MED ORDER — LIDOCAINE HCL 1 % IJ SOLN
5.0000 mL | INTRAMUSCULAR | Status: AC | PRN
  Administered 2021-11-18: 5 mL

## 2021-11-20 NOTE — Progress Notes (Signed)
   Procedure Note  Patient: Scott Salinas             Date of Birth: 1970-08-25           MRN: [id number]             Visit Date: 11/18/2021  Procedures: Visit Diagnoses:  1. Knee effusion, right   2. Right knee pain, unspecified chronicity     Large Joint Inj: R knee on 11/18/2021 10:26 AM Indications: diagnostic evaluation, joint swelling and pain Details: 18 G 1.5 in needle, superolateral approach  Arthrogram: No  Medications: 5 mL lidocaine 1 % Aspirate: 3 mL yellow Outcome: tolerated well, no immediate complications  Patient comes in today for aspiration from right knee joint.  He has antibiotic spacer in place with fairly good function of the right knee.  States that the only thing he really cannot do with the knee is run which he would like to do.  Advised him that even if this was a perfect total knee replacement, those implants are not designed for running.  He had previous evaluation by Dr. Marlou Sa with blood work that was taken and has since shown elevated inflammatory markers.  He is here today for aspiration.  No fluid was able to be aspirated with only trace effusion noted on exam.  About 3 cc of sterile saline was injected into the knee joint and was able to be withdrawn.  This will be sent off for fluid analysis with cell count, Gram stain, anaerobic/aerobic cultures.  Call him with results. Procedure, treatment alternatives, risks and benefits explained, specific risks discussed. Consent was given by the patient. Immediately prior to procedure a time out was called to verify the correct patient, procedure, equipment, support staff and site/side marked as required. Patient was prepped and draped in the usual sterile fashion.

## 2021-11-24 ENCOUNTER — Encounter: Payer: Self-pay | Admitting: Surgical

## 2021-11-24 LAB — SYNOVIAL FLUID ANALYSIS, COMPLETE
Basophils, %: 0 %
Eosinophils-Synovial: 0 % (ref 0–2)
Lymphocytes-Synovial Fld: 49 % (ref 0–74)
Monocyte/Macrophage: 9 % (ref 0–69)
Neutrophil, Synovial: 41 % — ABNORMAL HIGH (ref 0–24)
Synoviocytes, %: 1 % (ref 0–15)
WBC, Synovial: 444 cells/uL — ABNORMAL HIGH (ref <150)

## 2021-11-24 LAB — ANAEROBIC AND AEROBIC CULTURE
AER RESULT:: NO GROWTH
GRAM STAIN:: NONE SEEN
MICRO NUMBER:: 13648621
MICRO NUMBER:: 13648622
SPECIMEN QUALITY:: ADEQUATE
SPECIMEN QUALITY:: ADEQUATE

## 2021-12-05 ENCOUNTER — Other Ambulatory Visit (HOSPITAL_COMMUNITY): Payer: Self-pay | Admitting: Family Medicine

## 2021-12-05 ENCOUNTER — Ambulatory Visit (INDEPENDENT_AMBULATORY_CARE_PROVIDER_SITE_OTHER): Payer: Medicaid Other | Admitting: Internal Medicine

## 2021-12-05 ENCOUNTER — Encounter: Payer: Self-pay | Admitting: Internal Medicine

## 2021-12-05 ENCOUNTER — Other Ambulatory Visit (HOSPITAL_COMMUNITY): Payer: Self-pay

## 2021-12-05 VITALS — BP 122/88 | HR 68 | Ht 70.0 in | Wt 239.0 lb

## 2021-12-05 DIAGNOSIS — I5022 Chronic systolic (congestive) heart failure: Secondary | ICD-10-CM | POA: Diagnosis not present

## 2021-12-05 DIAGNOSIS — I2510 Atherosclerotic heart disease of native coronary artery without angina pectoris: Secondary | ICD-10-CM
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
## 2021-12-05 NOTE — Patient Instructions (Signed)
Medication Instructions:  Your physician recommends that you continue on your current medications as directed. Please refer to the Current Medication list given to you today.  *If you need a refill on your cardiac medications before your next appointment, please call your pharmacy*   Lab Work: None ordered.  If you have labs (blood work) drawn today and your tests are completely normal, you will receive your results only by: MyChart Message (if you have MyChart) OR A paper copy in the mail If you have any lab test that is abnormal or we need to change your treatment, we will call you to review the results.   Testing/Procedures: None ordered.    Follow-Up: At [facility], you and your health needs are our priority.  As part of our continuing mission to provide you with exceptional heart care, we have created designated Provider Care Teams.  These Care Teams include your primary Cardiologist (physician) and Advanced Practice Providers (APPs -  Physician Assistants and Nurse Practitioners) who all work together to provide you with the care you need, when you need it.  We recommend signing up for the patient portal called "MyChart".  Sign up information is provided on this After Visit Summary.  MyChart is used to connect with patients for Virtual Visits (Telemedicine).  Patients are able to view lab/test results, encounter notes, upcoming appointments, etc.  Non-urgent messages can be sent to your provider as well.   To learn more about what you can do with MyChart, go to [email or web address].    Your next appointment:   To be determined  Important Information About Sugar        

## 2021-12-06 ENCOUNTER — Other Ambulatory Visit (HOSPITAL_COMMUNITY): Payer: Self-pay

## 2021-12-06 ENCOUNTER — Other Ambulatory Visit (HOSPITAL_COMMUNITY): Payer: Self-pay | Admitting: *Deleted

## 2021-12-06 DIAGNOSIS — I2510 Atherosclerotic heart disease of native coronary artery without angina pectoris: Secondary | ICD-10-CM

## 2021-12-06 MED ORDER — POTASSIUM CHLORIDE CRYS ER 20 MEQ PO TBCR
40.0000 meq | EXTENDED_RELEASE_TABLET | Freq: Two times a day (BID) | ORAL | 3 refills | Status: DC
  Filled 2021-12-06: qty 120, 30d supply, fill #0

## 2021-12-06 MED ORDER — POTASSIUM CHLORIDE CRYS ER 20 MEQ PO TBCR
40.0000 meq | EXTENDED_RELEASE_TABLET | Freq: Two times a day (BID) | ORAL | 3 refills | Status: DC
  Filled 2021-12-06: qty 60, 15d supply, fill #0

## 2021-12-14 ENCOUNTER — Other Ambulatory Visit: Payer: Self-pay

## 2021-12-14 ENCOUNTER — Other Ambulatory Visit (HOSPITAL_COMMUNITY): Payer: Self-pay

## 2021-12-14 DIAGNOSIS — Z01812 Encounter for preprocedural laboratory examination: Secondary | ICD-10-CM

## 2021-12-14 DIAGNOSIS — I422 Other hypertrophic cardiomyopathy: Secondary | ICD-10-CM

## 2021-12-14 NOTE — Progress Notes (Signed)
Per Dr Caryl Comes pt will need cMRI for suspected HCM.  Order placed and pt contacted re: need for testing.  Pt verbalizes understanding and agrees with current plan.

## 2021-12-15 ENCOUNTER — Institutional Professional Consult (permissible substitution): Payer: Medicaid Other | Admitting: Internal Medicine

## 2021-12-20 ENCOUNTER — Telehealth (HOSPITAL_COMMUNITY): Payer: Self-pay

## 2021-12-20 NOTE — Telephone Encounter (Signed)
Called to confirm/remind patient of their appointment at the [facility] on 12/21/21.   Patient reminded to bring all medications and/or complete list.  Confirmed patient has transportation. Gave directions, instructed to utilize [address] parking.  Confirmed appointment prior to ending call.

## 2021-12-21 ENCOUNTER — Ambulatory Visit (HOSPITAL_COMMUNITY)
Admission: RE | Admit: 2021-12-21 | Discharge: 2021-12-21 | Disposition: A | Discharge status: Home / Self Care | Payer: Medicaid Other | Source: Ambulatory Visit | Attending: Family Medicine | Admitting: Family Medicine

## 2021-12-21 ENCOUNTER — Other Ambulatory Visit (HOSPITAL_COMMUNITY): Payer: Self-pay

## 2021-12-21 ENCOUNTER — Encounter (HOSPITAL_COMMUNITY): Payer: Self-pay

## 2021-12-21 VITALS — BP 120/98 | HR 66 | Wt 238.2 lb

## 2021-12-21 DIAGNOSIS — Z951 Presence of aortocoronary bypass graft: Secondary | ICD-10-CM | POA: Diagnosis not present

## 2021-12-21 DIAGNOSIS — Z7982 Long term (current) use of aspirin: Secondary | ICD-10-CM | POA: Insufficient documentation

## 2021-12-21 DIAGNOSIS — Z720 Tobacco use: Secondary | ICD-10-CM

## 2021-12-21 DIAGNOSIS — I10 Essential (primary) hypertension: Secondary | ICD-10-CM

## 2021-12-21 DIAGNOSIS — N183 Chronic kidney disease, stage 3 unspecified: Secondary | ICD-10-CM | POA: Diagnosis not present

## 2021-12-21 DIAGNOSIS — E785 Hyperlipidemia, unspecified: Secondary | ICD-10-CM | POA: Diagnosis not present

## 2021-12-21 DIAGNOSIS — G8929 Other chronic pain: Secondary | ICD-10-CM | POA: Diagnosis not present

## 2021-12-21 DIAGNOSIS — F17210 Nicotine dependence, cigarettes, uncomplicated: Secondary | ICD-10-CM | POA: Diagnosis not present

## 2021-12-21 DIAGNOSIS — I130 Hypertensive heart and chronic kidney disease with heart failure and stage 1 through stage 4 chronic kidney disease, or unspecified chronic kidney disease: Secondary | ICD-10-CM | POA: Diagnosis not present

## 2021-12-21 DIAGNOSIS — M25562 Pain in left knee: Secondary | ICD-10-CM | POA: Insufficient documentation

## 2021-12-21 DIAGNOSIS — N1832 Chronic kidney disease, stage 3b: Secondary | ICD-10-CM | POA: Diagnosis not present

## 2021-12-21 DIAGNOSIS — I255 Ischemic cardiomyopathy: Secondary | ICD-10-CM | POA: Diagnosis not present

## 2021-12-21 DIAGNOSIS — I5022 Chronic systolic (congestive) heart failure: Secondary | ICD-10-CM | POA: Diagnosis not present

## 2021-12-21 DIAGNOSIS — I2510 Atherosclerotic heart disease of native coronary artery without angina pectoris: Secondary | ICD-10-CM | POA: Diagnosis not present

## 2021-12-21 DIAGNOSIS — M25561 Pain in right knee: Secondary | ICD-10-CM | POA: Diagnosis not present

## 2021-12-21 LAB — BASIC METABOLIC PANEL
Anion gap: 9 (ref 5–15)
BUN: 22 mg/dL — ABNORMAL HIGH (ref 6–20)
CO2: 24 mmol/L (ref 22–32)
Calcium: 9.1 mg/dL (ref 8.9–10.3)
Chloride: 104 mmol/L (ref 98–111)
Creatinine, Ser: 2.52 mg/dL — ABNORMAL HIGH (ref 0.61–1.24)
GFR, Estimated: 30 mL/min — ABNORMAL LOW (ref >60)
Glucose, Bld: 96 mg/dL (ref 70–99)
Potassium: 3.3 mmol/L — ABNORMAL LOW (ref 3.5–5.1)
Sodium: 137 mmol/L (ref 135–145)

## 2021-12-21 MED ORDER — ASPIRIN 81 MG PO TBEC
81.0000 mg | DELAYED_RELEASE_TABLET | Freq: Every day | ORAL | 6 refills | Status: AC
  Filled 2021-12-21: qty 30, 30d supply, fill #0
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
## 2021-12-21 NOTE — Patient Instructions (Signed)
Thank you for coming in today  Labs were done today, if any labs are abnormal the clinic will call you No news is good news  RESTART Aspirin 81 mg 1 tablet daily   Your physician recommends that you schedule a follow-up appointment in:  3-4 months with Dr. [name]    Do the following things EVERYDAY: Weigh yourself in the morning before breakfast. Write it down and keep it in a log. Take your medicines as prescribed Eat low salt foods--Limit salt (sodium) to 2000 mg per day.  Stay as active as you can everyday Limit all fluids for the day to less than 2 liters  At the [facility], you and your health needs are our priority. As part of our continuing mission to provide you with exceptional heart care, we have created designated Provider Care Teams. These Care Teams include your primary Cardiologist (physician) and Advanced Practice Providers (APPs- Physician Assistants and Nurse Practitioners) who all work together to provide you with the care you need, when you need it.   You may see any of the following providers on your designated Care Team at your next follow up: Dr Glori Bickers Dr Haynes Kerns, NP Lyda Jester, [place] [facility] [place], [place] Audry Riles, PharmD   Please be sure to bring in all your medications bottles to every appointment.    If you have any questions or concerns before your next appointment please send [place] a message through [place] or call our office at [phone].    TO LEAVE A MESSAGE FOR THE NURSE SELECT OPTION 2, PLEASE LEAVE A MESSAGE INCLUDING: YOUR NAME DATE OF BIRTH CALL BACK NUMBER REASON FOR CALL**this is important as we prioritize the call backs  YOU WILL RECEIVE A CALL BACK THE SAME DAY AS LONG AS YOU CALL BEFORE 4:00 PM

## 2021-12-22 ENCOUNTER — Encounter: Payer: Self-pay | Admitting: Orthopedic Surgery

## 2021-12-22 ENCOUNTER — Ambulatory Visit (INDEPENDENT_AMBULATORY_CARE_PROVIDER_SITE_OTHER): Payer: Medicaid Other | Admitting: Orthopedic Surgery

## 2021-12-22 DIAGNOSIS — M1712 Unilateral primary osteoarthritis, left knee: Secondary | ICD-10-CM | POA: Diagnosis not present
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
## 2021-12-23 ENCOUNTER — Other Ambulatory Visit (HOSPITAL_COMMUNITY): Payer: Self-pay | Admitting: *Deleted

## 2021-12-23 ENCOUNTER — Telehealth (HOSPITAL_COMMUNITY): Payer: Self-pay | Admitting: *Deleted

## 2021-12-23 DIAGNOSIS — I5022 Chronic systolic (congestive) heart failure: Secondary | ICD-10-CM

## 2021-12-23 MED ORDER — BUPIVACAINE HCL 0.25 % IJ SOLN
4.0000 mL | INTRAMUSCULAR | Status: AC | PRN
  Administered 2021-12-22: 4 mL via INTRA_ARTICULAR

## 2021-12-23 MED ORDER — LIDOCAINE HCL 1 % IJ SOLN
5.0000 mL | INTRAMUSCULAR | Status: AC | PRN
  Administered 2021-12-22: 5 mL

## 2021-12-23 MED ORDER — METHYLPREDNISOLONE ACETATE 40 MG/ML IJ SUSP
40.0000 mg | INTRAMUSCULAR | Status: AC | PRN
  Administered 2021-12-22: 40 mg via INTRA_ARTICULAR

## 2021-12-23 NOTE — Telephone Encounter (Signed)
Called patient per Scott Katz, NP re: lab results from last visit. Potassium was low, but patient says he had "been out of my medicine for a while". Patient says he re-started potassium last night. No medication changes at this time per Janett Billow, will repeat BMP on 01/05/22 at 2:30 pm. Pt verbalized understanding of same.  Zada Girt RN

## 2021-12-30 ENCOUNTER — Telehealth (HOSPITAL_COMMUNITY): Payer: Self-pay | Admitting: *Deleted

## 2021-12-30 NOTE — Telephone Encounter (Signed)
Returned call from message left inquiring for directions.  Called and provided detail instructions on how to find our department along with verification of time of his appts.  Verbalized understanding. Cherre Huger, BSN Cardiac and Training and development officer

## 2022-01-02 ENCOUNTER — Telehealth (HOSPITAL_COMMUNITY): Payer: Self-pay | Admitting: *Deleted

## 2022-01-02 NOTE — Telephone Encounter (Signed)
Spoke to pt regarding his health history and confirmed his appointment for cardiac rehab orientation. Discussed what to wear and bring and directions to program. Pt voiced understanding.  Yves Dill BS, ACSM-CEP 01/02/2022 11:30 AM

## 2022-01-03 ENCOUNTER — Other Ambulatory Visit (HOSPITAL_COMMUNITY): Payer: Self-pay

## 2022-01-03 ENCOUNTER — Encounter (HOSPITAL_COMMUNITY): Payer: Self-pay

## 2022-01-03 ENCOUNTER — Other Ambulatory Visit (HOSPITAL_COMMUNITY): Payer: Self-pay | Admitting: Cardiology

## 2022-01-03 ENCOUNTER — Encounter (HOSPITAL_COMMUNITY)
Admission: RE | Admit: 2022-01-03 | Discharge: 2022-01-03 | Disposition: A | Discharge status: Home / Self Care | Payer: Medicaid Other | Source: Ambulatory Visit | Attending: Cardiology | Admitting: Cardiology

## 2022-01-03 VITALS — BP 122/92 | HR 70 | Ht 69.0 in | Wt 239.0 lb

## 2022-01-03 DIAGNOSIS — I5022 Chronic systolic (congestive) heart failure: Secondary | ICD-10-CM | POA: Insufficient documentation

## 2022-01-03 DIAGNOSIS — I2510 Atherosclerotic heart disease of native coronary artery without angina pectoris: Secondary | ICD-10-CM

## 2022-01-03 NOTE — Progress Notes (Addendum)
Cardiac Rehab Medication Review by a Nurse  Does the patient  feel that his/her medications are working for him/her?  yes  Has the patient been experiencing any side effects to the medications prescribed?  no  Does the patient measure his/her own blood pressure or blood glucose at home?  no   Does the patient have any problems obtaining medications due to transportation or finances?   no  Understanding of regimen: poor Understanding of indications: fair Potential of compliance: poor    Nurse comments: Scott Salinas says he is taking his medications as prescribed currently. I went over the indications of each medication with Scott Salinas. Scott Salinas says he is taking a total of 4 coreg daily. Will check with the heart failure clinic as he is prescribed 25 mg twice a day per documentation. I received clarification from Allena Katz NP that Scott Salinas is supposed to be taking 25 of Coreg twice a day. I let the patient know. I asked Scott Salinas to bring his bottles with him for his first day of exercise on Wed 01/11/22.  I called [place] per the patient to get his Imdur, Potassium, Aldactone and Demadex refilled. Scott Salinas does not check his blood pressures at home. Scott Gave RN BSN     Scott See [name] RN 01/03/2022 8:58 AM

## 2022-01-03 NOTE — Progress Notes (Signed)
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 individual video education with verbal and written material and guidebook.  Patient learns key strategies and recommendations to stay in good physical health and enhance quality of life, such as prevention strategies, having an advocate, securing a [place], and keeping a list of medications and system for tracking them. It also discusses how to avoid risk for bone loss.  Biology of Weight Control  Clinical staff conducted group or individual video education with verbal and written material and guidebook.  Patient learns that weight gain occurs because we consume more calories than we burn (eating more, moving less). Even if your body weight is normal, you may have higher ratios of fat compared to muscle mass. Too much body fat puts you at increased risk for cardiovascular disease, heart  attack, stroke, type 2 diabetes, and obesity-related cancers. In addition to exercise, following the [place] can help reduce your risk.  Decoding Lab Results  Clinical staff conducted group or individual video education with verbal and written material and guidebook.  Patient learns that lab test reflects one measurement whose values change over time and are influenced by many factors, including medication, stress, sleep, exercise, food, hydration, pre-existing medical conditions, and more. It is recommended to use the knowledge from this video to become more involved with your lab results and evaluate your numbers to speak with your doctor.   Diseases of Our Time - Overview  Clinical staff conducted group or individual video education with verbal and written material and guidebook.  Patient learns that according to the CDC, 50% to 70% of chronic diseases (such as obesity, type 2 diabetes, elevated lipids, hypertension, and heart disease) are avoidable through lifestyle improvements including healthier food choices, listening to satiety cues, and increased physical activity.  Sleep Disorders Clinical staff conducted group or individual video education with verbal and written material and guidebook.  Patient learns how good quality and duration of sleep are important to overall health and well-being. Patient also learns about sleep disorders and how they impact health along with recommendations to address them, including discussing with a physician.  Nutrition  Dining Out - Part 2 Clinical staff conducted group or individual video education with verbal and written material and guidebook.  Patient learns how to plan ahead and communicate in order to maximize their dining experience in a healthy and nutritious manner. Included are recommended food choices based on the type of restaurant the patient is visiting.   Fueling a Best boy conducted group or individual  video education with verbal and written material and guidebook.  There is a strong connection between our food choices and our health. Diseases like obesity and type 2 diabetes are very prevalent and are in large-part due to lifestyle choices. The Pritikin Eating Plan provides plenty of food and hunger-curbing satisfaction. It is easy to follow, affordable, and helps reduce health risks.  Menu Workshop  Clinical staff conducted group or individual video education with verbal and written material and guidebook.  Patient learns that restaurant meals can sabotage health goals because they are often packed with calories, fat, sodium, and sugar. Recommendations include strategies to  plan ahead and to communicate with the manager, chef, or server to help order a healthier meal.  Planning Your Eating Strategy  Clinical staff conducted group or individual video education with verbal and written material and guidebook.  Patient learns about the [place] and its benefit of reducing the risk of disease. The [place] does not focus on calories. Instead, it emphasizes high-quality, nutrient-rich foods. By knowing the characteristics of the foods, we choose, we can determine their calorie density and make informed decisions.  Targeting Your Nutrition Priorities  Clinical staff conducted group or individual video education with verbal and written material and guidebook.  Patient learns that lifestyle habits have a tremendous impact on disease risk and progression. This video provides eating and physical activity recommendations based on your personal health goals, such as reducing LDL cholesterol, losing weight, preventing or controlling type 2 diabetes, and reducing high blood pressure.  Vitamins and Minerals  Clinical staff conducted group or individual video education with verbal and written material and guidebook.  Patient learns different ways to obtain key vitamins and minerals,  including through a recommended healthy diet. It is important to discuss all supplements you take with your doctor.   Healthy Mind-Set    Smoking Cessation  Clinical staff conducted group or individual video education with verbal and written material and guidebook.  Patient learns that cigarette smoking and tobacco addiction pose a serious health risk which affects millions of people. Stopping smoking will significantly reduce the risk of heart disease, lung disease, and many forms of cancer. Recommended strategies for quitting are covered, including working with your doctor to develop a successful plan.  Culinary   Becoming a Financial trader conducted group or individual video education with verbal and written material and guidebook.  Patient learns that cooking at home can be healthy, cost-effective, quick, and puts them in control. Keys to cooking healthy recipes will include looking at your recipe, assessing your equipment needs, planning ahead, making it simple, choosing cost-effective seasonal ingredients, and limiting the use of added fats, salts, and sugars.  Cooking - Breakfast and Snacks  Clinical staff conducted group or individual video education with verbal and written material and guidebook.  Patient learns how important breakfast is to satiety and nutrition through the entire day. Recommendations include key foods to eat during breakfast to help stabilize blood sugar levels and to prevent overeating at meals later in the day. Planning ahead is also a key component.  Cooking - Human resources officer conducted group or individual video education with verbal and written material and guidebook.  Patient learns eating strategies to improve overall health, including an approach to cook more at home. Recommendations include thinking of animal protein as a side on your plate rather than center stage and focusing instead on lower calorie dense options like vegetables,  fruits, whole grains, and plant-based proteins, such as beans. Making sauces in large quantities to freeze for later and leaving the skin on your vegetables are also recommended to maximize your experience.  Cooking - Healthy Salads and Dressing Clinical staff conducted group or individual video education with verbal and written material and guidebook.  Patient learns that vegetables, fruits, whole grains, and legumes are the foundations of the [place]. Recommendations include how to incorporate each of these in flavorful and healthy salads, and how to create homemade salad dressings. Proper handling of ingredients is also covered. Cooking - Soups and [employer] -  Soups and Desserts Clinical staff conducted group or individual video education with verbal and written material and guidebook.  Patient learns that Pritikin soups and desserts make for easy, nutritious, and delicious snacks and meal components that are low in sodium, fat, sugar, and calorie density, while high in vitamins, minerals, and filling fiber. Recommendations include simple and healthy ideas for soups and desserts.   Overview     The Pritikin Solution Program Overview Clinical staff conducted group or individual video education with verbal and written material and guidebook.  Patient learns that the results of the [place] Program have been documented in more than 100 articles published in peer-reviewed journals, and the benefits include reducing risk factors for (and, in some cases, even reversing) high cholesterol, high blood pressure, type 2 diabetes, obesity, and more! An overview of the three key pillars of the Pritikin Program will be covered: eating well, doing regular exercise, and having a healthy mind-set.  WORKSHOPS  Exercise: Exercise Basics: Building Your Action Plan Clinical staff led group instruction and group discussion with PowerPoint presentation and patient guidebook. To enhance the  learning environment the use of posters, models and videos may be added. At the conclusion of this workshop, patients will comprehend the difference between physical activity and exercise, as well as the benefits of incorporating both, into their routine. Patients will understand the FITT (Frequency, Intensity, Time, and Type) principle and how to use it to build an exercise action plan. In addition, safety concerns and other considerations for exercise and cardiac rehab will be addressed by the presenter. The purpose of this lesson is to promote a comprehensive and effective weekly exercise routine in order to improve patients' overall level of fitness.   Managing Heart Disease: Your Path to a Healthier Heart Clinical staff led group instruction and group discussion with PowerPoint presentation and patient guidebook. To enhance the learning environment the use of posters, models and videos may be added.At the conclusion of this workshop, patients will understand the anatomy and physiology of the heart. Additionally, they will understand how Pritikin's three pillars impact the risk factors, the progression, and the management of heart disease.  The purpose of this lesson is to provide a high-level overview of the heart, heart disease, and how the Pritikin lifestyle positively impacts risk factors.  Exercise Biomechanics Clinical staff led group instruction and group discussion with PowerPoint presentation and patient guidebook. To enhance the learning environment the use of posters, models and videos may be added. Patients will learn how the structural parts of their bodies function and how these functions impact their daily activities, movement, and exercise. Patients will learn how to promote a neutral spine, learn how to manage pain, and identify ways to improve their physical movement in order to promote healthy living. The purpose of this lesson is to expose patients to common  physical limitations that impact physical activity. Participants will learn practical ways to adapt and manage aches and pains, and to minimize their effect on regular exercise. Patients will learn how to maintain good posture while sitting, walking, and lifting.  Balance Training and Fall Prevention  Clinical staff led group instruction and group discussion with PowerPoint presentation and patient guidebook. To enhance the learning environment the use of posters, models and videos may be added. At the conclusion of this workshop, patients will understand the importance of their sensorimotor skills (vision, proprioception, and the vestibular system) in maintaining their ability to balance as they age. Patients will apply a variety of balancing  exercises that are appropriate for their current level of function. Patients will understand the common causes for poor balance, possible solutions to these problems, and ways to modify their physical environment in order to minimize their fall risk. The purpose of this lesson is to teach patients about the importance of maintaining balance as they age and ways to minimize their risk of falling.  WORKSHOPS   Nutrition:  Fueling a Scientist, research (physical sciences) led group instruction and group discussion with PowerPoint presentation and patient guidebook. To enhance the learning environment the use of posters, models and videos may be added. Patients will review the foundational principles of the [place] and understand what constitutes a serving size in each of the food groups. Patients will also learn Pritikin-friendly foods that are better choices when away from home and review make-ahead meal and snack options. Calorie density will be reviewed and applied to three nutrition priorities: weight maintenance, weight loss, and weight gain. The purpose of this lesson is to reinforce (in a group setting) the key concepts around what patients are  recommended to eat and how to apply these guidelines when away from home by planning and selecting Pritikin-friendly options. Patients will understand how calorie density may be adjusted for different weight management goals.  Mindful Eating  Clinical staff led group instruction and group discussion with PowerPoint presentation and patient guidebook. To enhance the learning environment the use of posters, models and videos may be added. Patients will briefly review the concepts of the [place] and the importance of low-calorie dense foods. The concept of mindful eating will be introduced as well as the importance of paying attention to internal hunger signals. Triggers for non-hunger eating and techniques for dealing with triggers will be explored. The purpose of this lesson is to provide patients with the opportunity to review the basic principles of the [place], discuss the value of eating mindfully and how to measure internal cues of hunger and fullness using the Hunger Scale. Patients will also discuss reasons for non-hunger eating and learn strategies to use for controlling emotional eating.  Targeting Your Nutrition Priorities Clinical staff led group instruction and group discussion with PowerPoint presentation and patient guidebook. To enhance the learning environment the use of posters, models and videos may be added. Patients will learn how to determine their genetic susceptibility to disease by reviewing their family history. Patients will gain insight into the importance of diet as part of an overall healthy lifestyle in mitigating the impact of genetics and other environmental insults. The purpose of this lesson is to provide patients with the opportunity to assess their personal nutrition priorities by looking at their family history, their own health history and current risk factors. Patients will also be able to discuss ways of prioritizing and modifying the [place] for their highest risk areas  Menu  Clinical staff led group instruction and group discussion with PowerPoint presentation and patient guidebook. To enhance the learning environment the use of posters, models and videos may be added. Using menus brought in from [employer], or printed from [employer], patients will apply the [place] dining out guidelines that were presented in the [employer] video. Patients will also be able to practice these guidelines in a variety of provided scenarios. The purpose of this lesson is to provide patients with the opportunity to practice hands-on learning of the [place] with actual menus and practice scenarios.  Label Reading  Clinical staff led group instruction and group discussion with PowerPoint presentation and patient guidebook. To enhance the learning environment the use of posters, models and videos may be added. Patients will review and discuss the Pritikin label reading guidelines presented in Pritikin's Label Reading Educational series video. Using fool labels brought in from local grocery stores and markets, patients will apply the label reading guidelines and determine if the packaged food meet the Pritikin guidelines. The purpose of this lesson is to provide patients with the opportunity to review, discuss, and practice hands-on learning of the Pritikin Label Reading guidelines with actual packaged food labels. [place] Workshops are designed to teach patients ways to prepare quick, simple, and affordable recipes at home. The importance of nutrition's role in chronic disease risk reduction is reflected in its emphasis in the overall Pritikin program. By learning how to prepare essential core Pritikin Eating Plan recipes, patients will increase control over what they eat; be able to customize the flavor of foods without the use of added salt, sugar, or fat; and  improve the quality of the food they consume. By learning a set of core recipes which are easily assembled, quickly prepared, and affordable, patients are more likely to prepare more healthy foods at home. These workshops focus on convenient breakfasts, simple entres, side dishes, and desserts which can be prepared with minimal effort and are consistent with nutrition recommendations for cardiovascular risk reduction. Cooking [employer] are taught by a Engineer, materials (RD) who has been trained by the [employer]. The chef or RD has a clear understanding of the importance of minimizing - if not completely eliminating - added fat, sugar, and sodium in recipes. Throughout the series of [place] Workshop sessions, patients will learn about healthy ingredients and efficient methods of cooking to build confidence in their capability to prepare    Cooking School weekly topics:  Adding Flavor- Sodium-Free  Fast and Healthy Breakfasts  Powerhouse Plant-Based Proteins  Satisfying Salads and Dressings  Simple Sides and Sauces  International Cuisine-Spotlight on the [place] Zones  Delicious Desserts  Savory Soups  Efficiency Cooking - Meals in a Snap  Tasty Appetizers and Snacks  Comforting Weekend Breakfasts  One-Pot Wonders   Fast Evening Meals  Easy [place] (Psychosocial): New Thoughts, New Behaviors Clinical staff led group instruction and group discussion with PowerPoint presentation and patient guidebook. To enhance the learning environment the use of posters, models and videos may be added. Patients will learn and practice techniques for developing effective health and lifestyle goals. Patients will be able to effectively apply the goal setting process learned to develop at least one new personal goal.  The purpose of this lesson is to expose patients to a new skill set of behavior  modification techniques such as techniques setting SMART goals, overcoming barriers, and achieving new thoughts and new behaviors.  Managing Moods and Relationships Clinical staff led group instruction and group discussion with PowerPoint presentation and patient guidebook. To enhance the learning environment the use of posters, models and videos may be added. Patients will learn how emotional and chronic stress factors can impact their health and relationships. They will learn healthy ways to manage their moods and utilize positive coping mechanisms. In addition, ICR patients will learn ways to improve communication skills. The purpose of this lesson is to expose patients to ways of understanding how one's mood and health are  intimately connected. Developing a healthy outlook can help build positive relationships and connections with others. Patients will understand the importance of utilizing effective communication skills that include actively listening and being heard. They will learn and understand the importance of the "4 Cs" and especially Connections in fostering of a Healthy Mind-Set.  Healthy Sleep for a Healthy Heart Clinical staff led group instruction and group discussion with PowerPoint presentation and patient guidebook. To enhance the learning environment the use of posters, models and videos may be added. At the conclusion of this workshop, patients will be able to demonstrate knowledge of the importance of sleep to overall health, well-being, and quality of life. They will understand the symptoms of, and treatments for, common sleep disorders. Patients will also be able to identify daytime and nighttime behaviors which impact sleep, and they will be able to apply these tools to help manage sleep-related challenges. The purpose of this lesson is to provide patients with a general overview of sleep and outline the importance of quality sleep. Patients will learn about a few of the most common  sleep disorders. Patients will also be introduced to the concept of "sleep hygiene," and discover ways to self-manage certain sleeping problems through simple daily behavior changes. Finally, the workshop will motivate patients by clarifying the links between quality sleep and their goals of heart-healthy living.   Recognizing and Reducing Stress Clinical staff led group instruction and group discussion with PowerPoint presentation and patient guidebook. To enhance the learning environment the use of posters, models and videos may be added. At the conclusion of this workshop, patients will be able to understand the types of stress reactions, differentiate between acute and chronic stress, and recognize the impact that chronic stress has on their health. They will also be able to apply different coping mechanisms, such as reframing negative self-talk. Patients will have the opportunity to practice a variety of stress management techniques, such as deep abdominal breathing, progressive muscle relaxation, and/or guided imagery.  The purpose of this lesson is to educate patients on the role of stress in their lives and to provide healthy techniques for coping with it.  Learning Barriers/Preferences:  Learning Barriers/Preferences - 01/03/22 1116       Learning Barriers/Preferences   Learning Barriers Sight   wears glasses   Learning Preferences Audio;Skilled Demonstration;Computer/Internet;Verbal Instruction;Group Instruction;Video;Individual Instruction;Written Material;Pictoral             Education Topics:  Knowledge Questionnaire Score:  Knowledge Questionnaire Score - 01/03/22 1116       Knowledge Questionnaire Score   Pre Score 23/28             Core Components/Risk Factors/Patient Goals at Admission:  Personal Goals and Risk Factors at Admission - 01/03/22 1125       Core Components/Risk Factors/Patient Goals on Admission    Weight Management Yes;Obesity;Weight Loss     Intervention Weight Management: Develop a combined nutrition and exercise program designed to reach desired caloric intake, while maintaining appropriate intake of nutrient and fiber, sodium and fats, and appropriate energy expenditure required for the weight goal.;Weight Management: Provide education and appropriate resources to help participant work on and attain dietary goals.;Weight Management/Obesity: Establish reasonable short term and long term weight goals.;Obesity: Provide education and appropriate resources to help participant work on and attain dietary goals.    Admit Weight 238 lb 15.7 oz (108.4 kg)    Expected Outcomes Long Term: Adherence to nutrition and physical activity/exercise program aimed toward attainment of established weight  goal;Short Term: Continue to assess and modify interventions until short term weight is achieved;Weight Loss: Understanding of general recommendations for a balanced deficit meal plan, which promotes 1-2 lb weight loss per week and includes a negative energy balance of [phone] kcal/d;Understanding of distribution of calorie intake throughout the day with the consumption of 4-5 meals/snacks;Understanding recommendations for meals to include 15-35% energy as protein, 25-35% energy from fat, 35-60% energy from carbohydrates, less than 200mg  of dietary cholesterol, 20-35 gm of total fiber daily    Tobacco Cessation Yes    Number of packs per day .5    Intervention Assist the participant in steps to quit. Provide individualized education and counseling about committing to Tobacco Cessation, relapse prevention, and pharmacological support that can be provided by physician.;Advice worker, assist with locating and accessing local/national Quit Smoking programs, and support quit date choice.    Expected Outcomes Short Term: Will demonstrate readiness to quit, by selecting a quit date.;Short Term: Will quit all tobacco product use, adhering to prevention of  relapse plan.;Long Term: Complete abstinence from all tobacco products for at least 12 months from quit date.    Heart Failure Yes    Intervention Provide a combined exercise and nutrition program that is supplemented with education, support and counseling about heart failure. Directed toward relieving symptoms such as shortness of breath, decreased exercise tolerance, and extremity edema.    Expected Outcomes Long term: Adoption of self-care skills and reduction of barriers for early signs and symptoms recognition and intervention leading to self-care maintenance.;Short term: Daily weights obtained and reported for increase. Utilizing diuretic protocols set by physician.;Short term: Attendance in program 2-3 days a week with increased exercise capacity. Reported lower sodium intake. Reported increased fruit and vegetable intake. Reports medication compliance.;Improve functional capacity of life    Hypertension Yes    Intervention Provide education on lifestyle modifcations including regular physical activity/exercise, weight management, moderate sodium restriction and increased consumption of fresh fruit, vegetables, and low fat dairy, alcohol moderation, and smoking cessation.;Monitor prescription use compliance.    Expected Outcomes Short Term: Continued assessment and intervention until BP is < 140/78mm HG in hypertensive participants. < 130/45mm HG in hypertensive participants with diabetes, heart failure or chronic kidney disease.;Long Term: Maintenance of blood pressure at goal levels.    Lipids Yes    Intervention Provide education and support for participant on nutrition & aerobic/resistive exercise along with prescribed medications to achieve LDL 70mg , HDL >40mg .    Expected Outcomes Long Term: Cholesterol controlled with medications as prescribed, with individualized exercise RX and with personalized nutrition plan. Value goals: LDL < 70mg , HDL > 40 mg.;Short Term: Participant states  understanding of desired cholesterol values and is compliant with medications prescribed. Participant is following exercise prescription and nutrition guidelines.    Stress Yes    Intervention Offer individual and/or small group education and counseling on adjustment to heart disease, stress management and health-related lifestyle change. Teach and support self-help strategies.;Refer participants experiencing significant psychosocial distress to appropriate mental health specialists for further evaluation and treatment. When possible, include family members and significant others in education/counseling sessions.    Expected Outcomes Short Term: Participant demonstrates changes in health-related behavior, relaxation and other stress management skills, ability to obtain effective social support, and compliance with psychotropic medications if prescribed.;Long Term: Emotional wellbeing is indicated by absence of clinically significant psychosocial distress or social isolation.             Core Components/Risk Factors/Patient Goals Review:  Core Components/Risk Factors/Patient Goals at Discharge (Final Review):    ITP Comments:  ITP Comments     Row Name 01/03/22 0843           ITP Comments Dr Fransico Him MD, Medical Director, Introduction to [facility] Education Program/ Intensive Cardiac Rehab. Initial Orientation Packet Reviewed with the patient                Comments: [name] attended orientation for the cardiac rehabilitation program on  01/03/2022  to perform initial intake and exercise walk test. Patient introduced to the [facility] education and orientation packet was reviewed. Completed 6-minute walk test, measurements, initial ITP, and exercise prescription. Vital signs Systolic BP ranged from 757-322. Diastolic BP ranged from 56-72. Telemetry-normal sinus rhythm, T wave inversion intermittent PVC'sasymptomatic. [name] reported having chronic bilateral knee pain and right  ankle pain before and chronic pain after the walk test. Patient given information on smoking cessation as he is smoking 1/2 a pack of cigarettes and 10 mariajuana daily.Harrell Gave RN BSN    Service time was from 0800 to 1020.

## 2022-01-05 ENCOUNTER — Other Ambulatory Visit (HOSPITAL_COMMUNITY): Payer: Self-pay

## 2022-01-05 ENCOUNTER — Ambulatory Visit (HOSPITAL_COMMUNITY)
Admission: RE | Admit: 2022-01-05 | Discharge: 2022-01-05 | Disposition: A | Discharge status: Home / Self Care | Payer: Medicaid Other | Source: Ambulatory Visit | Attending: Internal Medicine | Admitting: Internal Medicine

## 2022-01-05 ENCOUNTER — Other Ambulatory Visit (HOSPITAL_COMMUNITY): Payer: Self-pay | Admitting: *Deleted

## 2022-01-05 DIAGNOSIS — I5022 Chronic systolic (congestive) heart failure: Secondary | ICD-10-CM | POA: Diagnosis not present

## 2022-01-05 LAB — BASIC METABOLIC PANEL
Anion gap: 6 (ref 5–15)
BUN: 24 mg/dL — ABNORMAL HIGH (ref 6–20)
CO2: 24 mmol/L (ref 22–32)
Calcium: 9 mg/dL (ref 8.9–10.3)
Chloride: 105 mmol/L (ref 98–111)
Creatinine, Ser: 2.52 mg/dL — ABNORMAL HIGH (ref 0.61–1.24)
GFR, Estimated: 30 mL/min — ABNORMAL LOW (ref >60)
Glucose, Bld: 90 mg/dL (ref 70–99)
Potassium: 4.4 mmol/L (ref 3.5–5.1)
Sodium: 135 mmol/L (ref 135–145)

## 2022-01-05 MED ORDER — POTASSIUM CHLORIDE CRYS ER 20 MEQ PO TBCR
60.0000 meq | EXTENDED_RELEASE_TABLET | Freq: Two times a day (BID) | ORAL | 3 refills | Status: DC
  Filled 2022-01-05 – 2022-01-13 (×3): qty 180, 30d supply, fill #0

## 2022-01-10 IMAGING — CT CT CHEST W/O CM
2 of 3 series · 15 of 36 positions shown, 18 images · non-contrast
Comparison: 02/09/2018 and 01/12/2015 CT, 10/25/2019 x-ray

CLINICAL DATA: Hypoxemia

EXAM:
CT CHEST WITHOUT CONTRAST
TECHNIQUE: Multidetector CT imaging of the chest was performed following the
standard protocol without IV contrast.

[Series 3: chest w/o 2mm st · axial · non-contrast · 0.85mm/px · z∈[+1153,+1425]mm · 12 of 160 slices shown, 15 images]
[im 12/160  mediastinal]
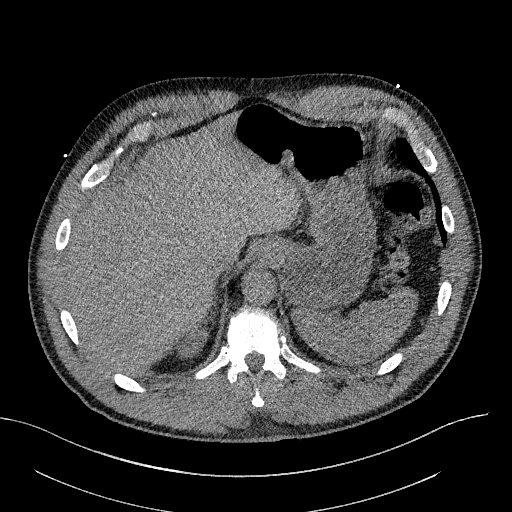
[im 12/160  lung]
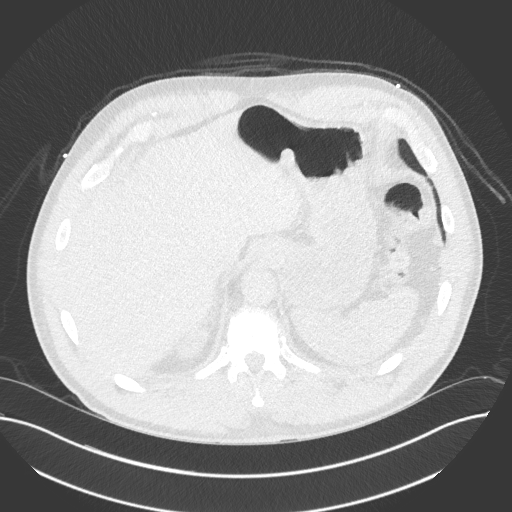
[im 24/160  lung]
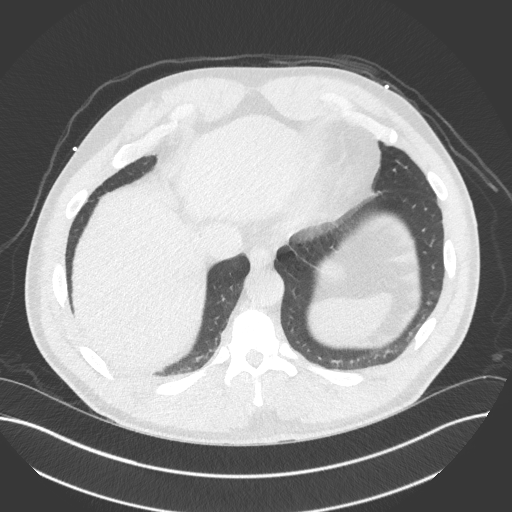
[im 36/160  lung]
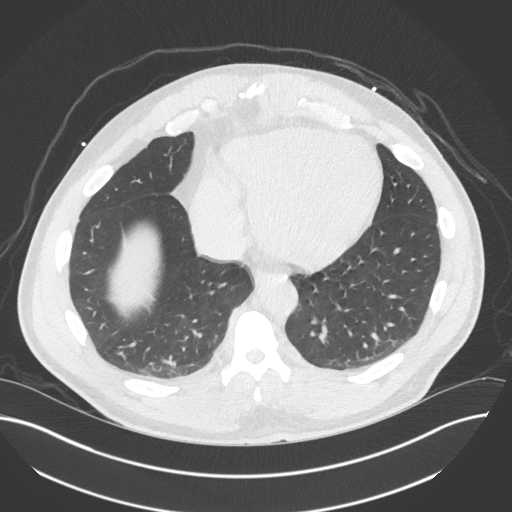
[im 48/160  lung]
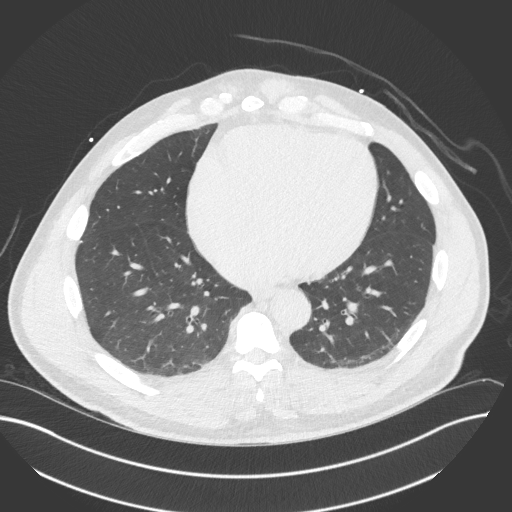
[im 59/160  mediastinal]
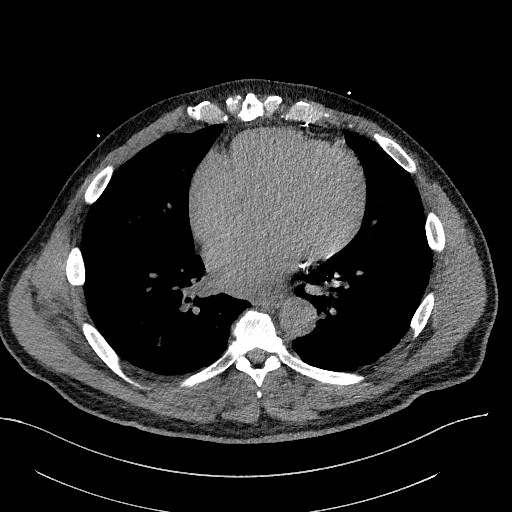
[im 59/160  lung]
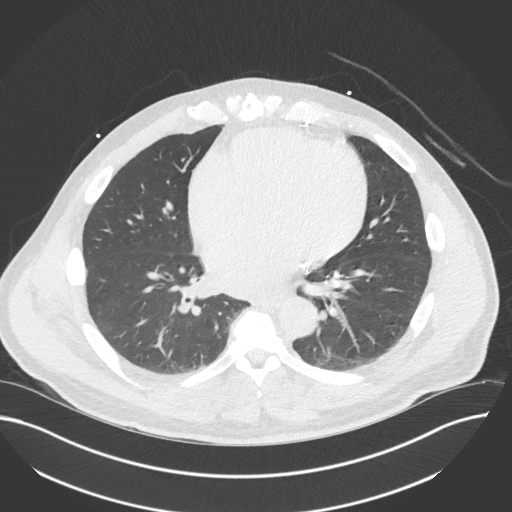
[im 71/160  lung]
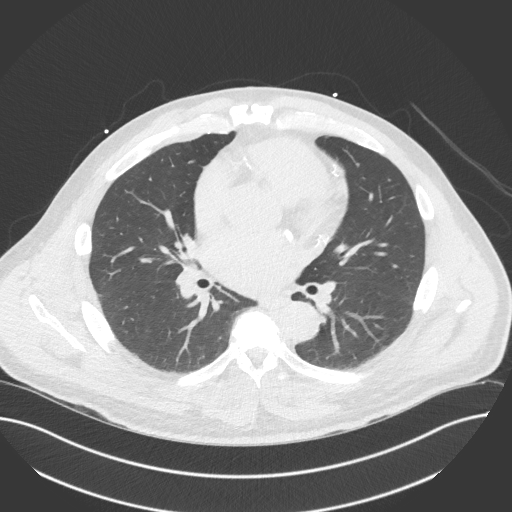
[im 89/160  lung]
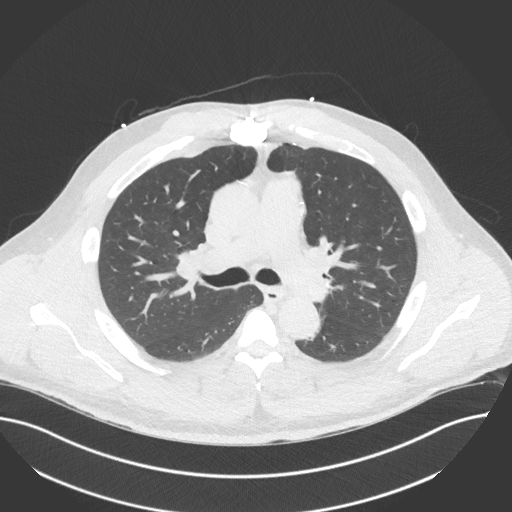
[im 101/160  lung]
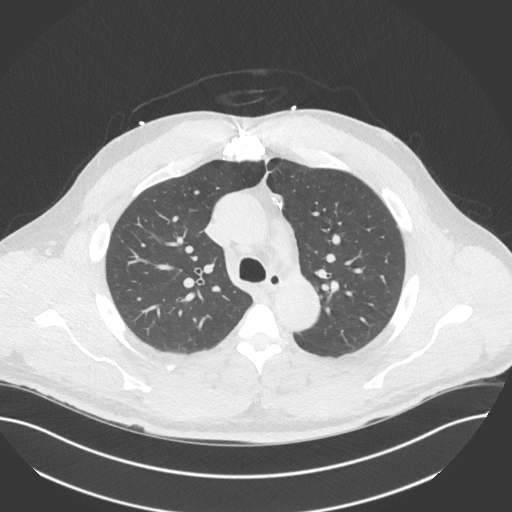
[im 112/160  mediastinal]
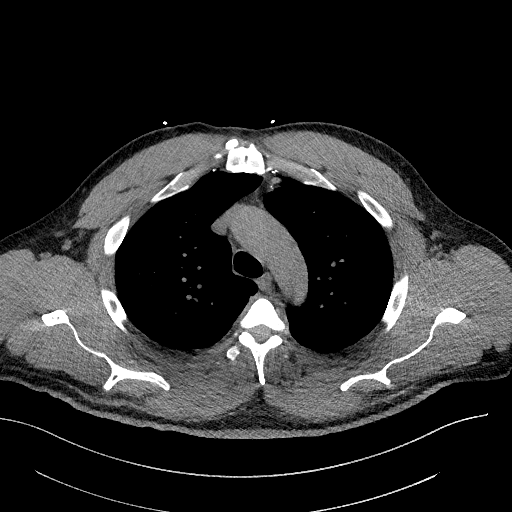
[im 112/160  lung]
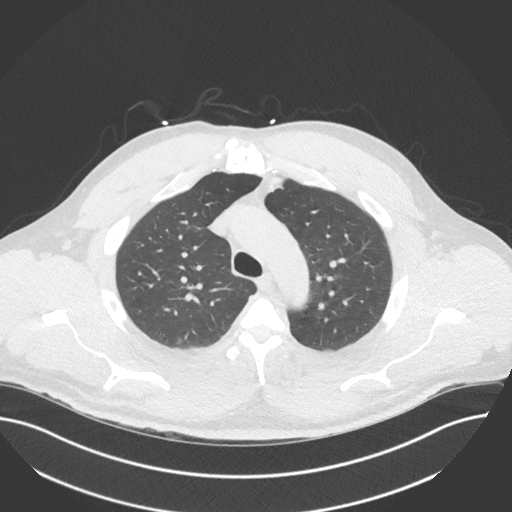
[im 124/160  lung]
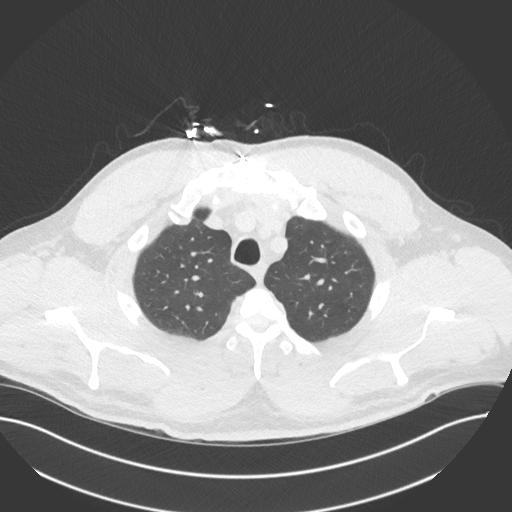
[im 136/160  lung]
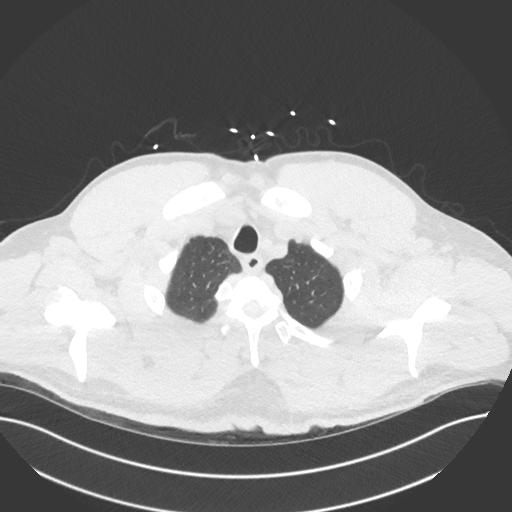
[im 148/160  lung]
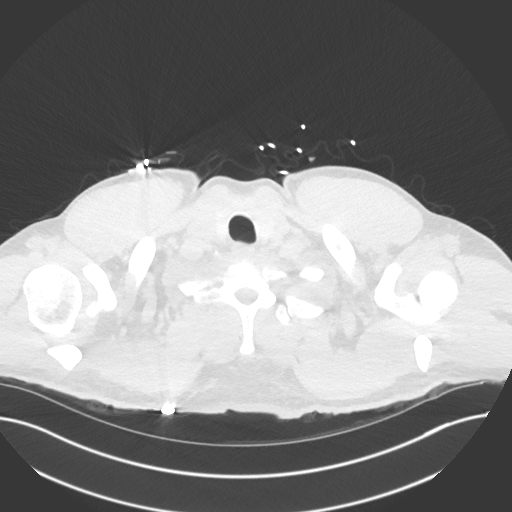

[Series 5: chest w/o 2mm st cor · coronal · non-contrast · 0.65mm/px · 3 of 151 slices shown]
[im 31/151  lung]
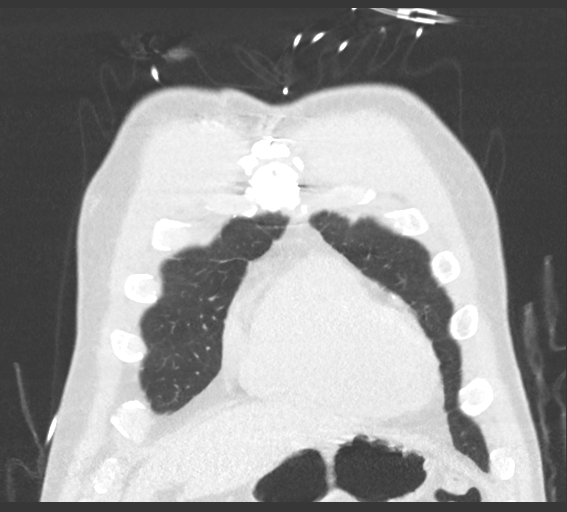
[im 61/151  lung]
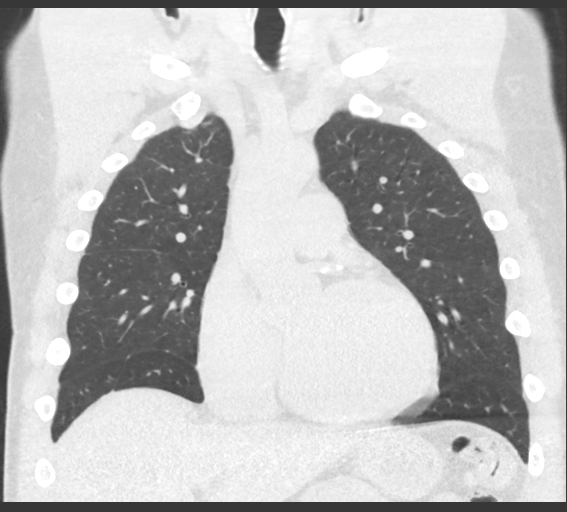
[im 91/151  lung]
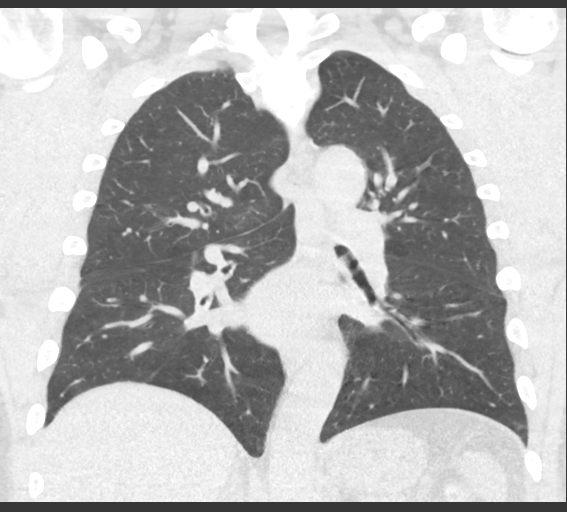

[15 of 36 positions shown; findings below may reference images not displayed]

FINDINGS: Cardiovascular: Median sternotomy and prior CABG. Heart size is
mildly enlarged. No pericardial effusion. Ascending thoracic aorta
measures 4.1 cm at the level of the right main pulmonary artery,
stable from 4783. Scattered atherosclerotic calcification of the
aorta and native coronary arteries. Main pulmonary trunk is not
dilated.

Mediastinum/Nodes: Mildly enlarged precarinal lymph node measuring
12 mm short axis, stable from 4783. No axillary lymphadenopathy. No
enlarged hilar lymph nodes, although evaluation is somewhat limited
in the absence of intravenous contrast. Unremarkable thyroid,
trachea, and esophagus.

Lungs/Pleura: Stable perifissural 3-4 mm right middle lobe pulmonary
nodule (series 4, image 84), benign. Minimal right basilar
subsegmental atelectasis. The lungs are otherwise clear. No focal
airspace consolidation. No pleural effusion or pneumothorax.

Upper Abdomen: No acute findings within the visualized upper
abdomen.

Musculoskeletal: No chest wall mass or suspicious bone lesions
identified.
IMPRESSION: 1. No acute findings within the chest.
2. Stable mild aneurysmal dilatation of the ascending thoracic aorta
measuring up to 4.1 cm. Recommend annual imaging followup by CTA or
MRA. This recommendation follows 5888
ACCF/AHA/AATS/ACR/ASA/SCA/HUO/SARR/YULI/ROX Guidelines for the
Diagnosis and Management of Patients with Thoracic Aortic Disease.
Circulation. 5888; 121: E266-e369. Aortic aneurysm NOS (7XJHC-2A2.H)
3. Nonspecific mildly enlarged precarinal lymph node measuring up to
12 mm short axis, stable from 4783.
4. Aortic atherosclerosis. (7XJHC-CA3.3).

## 2022-01-11 ENCOUNTER — Telehealth (HOSPITAL_COMMUNITY): Payer: Self-pay | Admitting: Licensed Clinical Social Worker

## 2022-01-11 ENCOUNTER — Telehealth (HOSPITAL_COMMUNITY): Payer: Self-pay | Admitting: Pharmacy Technician

## 2022-01-11 ENCOUNTER — Other Ambulatory Visit (HOSPITAL_COMMUNITY): Payer: Self-pay

## 2022-01-11 ENCOUNTER — Encounter (HOSPITAL_COMMUNITY)
Admission: RE | Admit: 2022-01-11 | Discharge: 2022-01-11 | Disposition: A | Discharge status: Home / Self Care | Payer: Medicaid Other | Source: Ambulatory Visit | Attending: Cardiology | Admitting: Cardiology

## 2022-01-11 ENCOUNTER — Telehealth (HOSPITAL_COMMUNITY): Payer: Self-pay | Admitting: *Deleted

## 2022-01-11 DIAGNOSIS — I5022 Chronic systolic (congestive) heart failure: Secondary | ICD-10-CM | POA: Insufficient documentation

## 2022-01-11 NOTE — Progress Notes (Addendum)
Patient here today for first day of exercise at cardiac rehab. Pre exercise blood pressure 124/92 heart rate 59thanks  162/108 heart rate 80 on the nustep, exercise stopped. Patient was switched to the walking track then 154/102 after walking a few laps. Heart rate 65. 144/94 hr 67 then 142/98. Medications reviewed. Patient asymptomatic. [name] told the dietitian he is taking additional supplements and was advised to stop taking the Organic Beet Root Powder Nitric Oxide . The heart failure clinic was called and notified about today's vital signs. The pharmacy was called [name] said that he was not able to get his potassium tablets and has not taken them in over a week. The pharmacy said that his insurance will over 5 potassium tablets a day not 6. The heart failure clinic was notified. [name] did not complete his exercise today. Keturah Shavers NP reviewed today's vital signs and did not make any changes. [name] was asked to bring his medication bottles on Friday before exercise. Patient states understanding.Barnet Pall, RN,BSN 01/11/2022 4:37 PM

## 2022-01-11 NOTE — Progress Notes (Signed)
Heart and Vascular Care Navigation  01/11/2022  Scott Salinas 1970-05-10 [id number]  Reason for Referral: homelessness and mental health resources Patient is participating in a Managed Medicaid Plan:Yes  Engaged with patient by telephone for follow up visit for Heart and Vascular Care Coordination.                                                                                                   Assessment:   Pt reports that about a month ago he had to leave the apartment he was staying in as it was section 8 housing and he was not on the lease.  States since that time he has been mainly sleeping out side- has encountered barriers finding a place due to having his dog.  Has someone who will let his dog stay there sometimes for money but this becomes cost prohibitive for him.  Pt has SSI but only gets $600.  Reports that is was lowered from $941 after [employer] determined he was getting help with living expenses but pt reports this was a misunderstanding because the address he gave them was just a mailing address and he was actually paying for all his own expenses.  CSW encouraged him to speak with [employer] to clarify new situation and see how he needs to get amount adjusted to full amount so he can obtain housing.  CSW left list of local income based and affordable housing options for patient at cardiac rehab for him to start applying.  Informed pt that income based housing has a long waitlist so would need to consider other options like boarding homes if wanting to move into housing sooner.  CSW also provided pt with list of shelters and number for coordinated entry so he can complete assessment with [facility] to potential get case management services to find permanent housing.  Pt also admits to wanting support for mental health concerns at this time.  Provided with brochure to [facility] and [facility] and encouraged pt to call his insurance plan to ask for in network  providers if he does not want to pursue either of those options.                                  HRT/VAS Care Coordination     Patients Home Cardiology Office Heart Failure Clinic   Outpatient Care Team Community Paramedicine   Community Paramedic Name: [facility] 10/16/19 for lack of communication and pt expressed he did not feel like it benefits him   Social Worker Name: Scott Salinas- [phone]   Living arrangements for the past 2 months Homeless Shelter   Patient Current Insurance Coverage Medicaid   Does Patient Have Prescription Coverage? Yes   Home Assistive Devices/Equipment None   DME Agency NA   HH Agency NA       Social History:  SDOH Screenings   Food Insecurity: Food Insecurity Present (01/11/2022)  Housing: High Risk (01/11/2022)  Depression (PHQ2-9): Low Risk  (01/03/2022)  Financial Resource Strain: High Risk (01/11/2022)  Tobacco Use: High Risk (01/03/2022)    SDOH Interventions: Financial Resources:  Financial Strain Interventions: Other (Comment) (referred to go to [facility] to get income amount fixed) Social Security for Licensed conveyancer Insecurity:  Food Insecurity Interventions: Assist with [employer] (Appt made with [employer])  Housing Insecurity:  Housing Interventions: Other (Comment) (provided with income based housing lists)  Transportation:    Not assessed     Follow-up plan:    Pt to reach out to CSW with any further questions or needs.  Informed pt would could help with some start up expenses for new housing if he finds a place to move in and money is a barrier- he will plan to reach out when he is at this step.  Scott Ny, LCSW Clinical Social Worker Advanced Heart Failure Clinic Desk#: [phone] Cell#: [phone]

## 2022-01-11 NOTE — Telephone Encounter (Signed)
Per [facility] with cardiac rehab "thanks 124/92 pre exercise heart rate 59. 162/108 heart rate 80 I stopped his exercise. 154/102 after walking a few laps. Heart rate 65. 144/94 hr 67 then 142/98. Pt is asymptomatic." Pt has not taken his torsemide today.   Per Janett Billow no changes if pt is asymptomatic.

## 2022-01-11 NOTE — Telephone Encounter (Signed)
Patient Advocate Encounter   Received notification from [facility] that prior authorization for Potassium 91meq (6 tablets daily) is required.   PA submitted on CoverMyMeds Key [id number] Status is pending   Will continue to follow.

## 2022-01-11 NOTE — Telephone Encounter (Signed)
CSW received message from cardiac rehab that pt recently lost housing and does not have a stable place to stay.  CSW attempted to call pt to discuss- unable to reach- left VM requesting return call  [name], [facility] Desk#: [phone] Cell#: [phone]

## 2022-01-12 ENCOUNTER — Other Ambulatory Visit (HOSPITAL_COMMUNITY): Payer: Self-pay

## 2022-01-12 NOTE — Telephone Encounter (Signed)
Advanced Heart Failure Patient Advocate Encounter  Prior Authorization for Potassium 54meq (6 tablets daily) has been approved.    PA# [id number] Effective dates: 01/11/22 through 01/12/23  Charlann Boxer, CPhT

## 2022-01-13 ENCOUNTER — Other Ambulatory Visit (HOSPITAL_COMMUNITY): Payer: Self-pay

## 2022-01-13 ENCOUNTER — Encounter (HOSPITAL_COMMUNITY)
Admission: RE | Admit: 2022-01-13 | Discharge: 2022-01-13 | Disposition: A | Discharge status: Home / Self Care | Payer: Medicaid Other | Source: Ambulatory Visit | Attending: Cardiology | Admitting: Cardiology

## 2022-01-13 DIAGNOSIS — I5022 Chronic systolic (congestive) heart failure: Secondary | ICD-10-CM

## 2022-01-13 MED ORDER — POTASSIUM CHLORIDE CRYS ER 20 MEQ PO TBCR
20.0000 meq | EXTENDED_RELEASE_TABLET | Freq: Two times a day (BID) | ORAL | 3 refills | Status: DC
  Filled 2022-01-13 – 2022-01-16 (×2): qty 180, 90d supply, fill #0

## 2022-01-13 NOTE — Progress Notes (Signed)
Daily Session Note  Patient Details  Name: Scott Salinas MRN: [id number] Date of Birth: 02-03-1971 Referring Provider:   Flowsheet Row CARDIAC REHAB PHASE II ORIENTATION from 01/03/2022 in [place]  Referring Provider Loralie Champagne, MD       Encounter Date: 01/13/2022  Check In:  Session Check In - 01/13/22 1531       Check-In   Supervising physician immediately available to respond to emergencies [facility] immediately available    Physician(s) Dr. Bonner Puna    Location [facility]    Staff Present Barnet Pall, RN, BSN;Samantha [place], RD, LDN;Jetta Gilford Rile BS, ACSM-CEP, Exercise Physiologist;Olinty Celesta Aver, MS, ACSM-CEP, Exercise Physiologist;Bailey Pearline Cables, MS, Exercise Physiologist;David Makemson, MS, ACSM-CEP, CCRP, Exercise Physiologist    Virtual Visit No    Medication changes reported     No    Fall or balance concerns reported    No    Tobacco Cessation No Change    Current number of cigarettes/nicotine per day     10   'smokes 10 blunts a day"   Warm-up and Cool-down Performed as group-led instruction    Resistance Training Performed Yes    VAD Patient? No    PAD/SET Patient? No      Pain Assessment   Currently in Pain? Yes    Pain Score 6     Pain Location Leg    Pain Orientation Left    Pain Type Chronic pain    Multiple Pain Sites No             Capillary Blood Glucose: No results found for this or any previous visit (from the past 24 hour(s)).    Social History   Tobacco Use  Smoking Status Every Day   Packs/day: 0.50   Years: 20.00   Total pack years: 10.00   Types: Cigarettes  Smokeless Tobacco Never  Tobacco Comments   Referral faxed to the [place] quit line. Scott Salinas says he is interested in quitting smoking. Chantx and the patch has not worked to him.    Goals Met:  Exercise tolerated well No report of concerns or symptoms today Strength training completed today  Goals Unmet:  Not  Applicable  Comments: Scott Salinas started cardiac rehab today.  Pt tolerated light exercise at a low level. Scott Salinas did report having chronic bilateral leg pain, Systolic BP ranged from 767'H to 160's , 41'P to 379 diastolic. telemetry-Sinus Rhythm with an inverted , asymptomatic.  Medication list reconciled. Pt denies barriers to medicaiton compliance.  PSYCHOSOCIAL ASSESSMENT:  PHQ-1. Pt exhibits positive coping skills, hopeful outlook with supportive family. No psychosocial needs identified at this time, no psychosocial interventions necessary.    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   Pt oriented to exercise equipment and routine.    Understanding verbalized.   QUALITY OF LIFE SCORE REVIEW  Pt completed Quality of Life survey as a participant in Cardiac Rehab.  Scores 21.0 or below are considered low.  Pt score very low in several areas Overall 11.3, Health and Function 16.50, socioeconomic 6.13, physiological and spiritual 4.71, family 12.30. Patient quality of life slightly altered by physical constraints which limits ability to perform as prior to recent cardiac illness.Scott Salinas is currently homeless. He was given a list of resources from the heart failure social worker.  Offered emotional support and reassurance. Scott Salinas also was given to phone number for counseling  Will continue to monitor and intervene as necessary. Will  forward quality of life questionnaire to Dr [name] as Mr Solanki does not have a primary care provider.Barnet Pall, RN,BSN 01/13/2022 4:40 PM      Dr. Fransico Him is Medical Director for Cardiac Rehab at [facility].

## 2022-01-16 ENCOUNTER — Other Ambulatory Visit (HOSPITAL_COMMUNITY): Payer: Self-pay | Admitting: *Deleted

## 2022-01-16 ENCOUNTER — Telehealth (HOSPITAL_COMMUNITY): Payer: Self-pay | Admitting: Pharmacy Technician

## 2022-01-16 ENCOUNTER — Encounter (HOSPITAL_COMMUNITY)
Admission: RE | Admit: 2022-01-16 | Discharge: 2022-01-16 | Disposition: A | Discharge status: Home / Self Care | Payer: Medicaid Other | Source: Ambulatory Visit | Attending: Cardiology | Admitting: Cardiology

## 2022-01-16 ENCOUNTER — Other Ambulatory Visit (HOSPITAL_COMMUNITY): Payer: Self-pay

## 2022-01-16 DIAGNOSIS — I5022 Chronic systolic (congestive) heart failure: Secondary | ICD-10-CM | POA: Diagnosis not present

## 2022-01-16 MED ORDER — POTASSIUM CHLORIDE CRYS ER 20 MEQ PO TBCR
60.0000 meq | EXTENDED_RELEASE_TABLET | Freq: Two times a day (BID) | ORAL | 3 refills | Status: DC
  Filled 2022-01-16: qty 180, 30d supply, fill #0
  Filled 2022-01-17: qty 540, 90d supply, fill #0

## 2022-01-16 NOTE — Telephone Encounter (Signed)
Patient Advocate Encounter   Received notification from [facility] that prior authorization for Potassium Crys ER 20Meq (6 daily) is required.   PA submitted on CoverMyMeds Key [id number]  Status is pending   Will continue to follow.

## 2022-01-17 ENCOUNTER — Other Ambulatory Visit (HOSPITAL_COMMUNITY): Payer: Self-pay

## 2022-01-17 NOTE — Telephone Encounter (Signed)
Advanced Heart Failure Patient Advocate Encounter  Prior Authorization for Potassium Crys 45meq (6 tabs daily) has been approved.    PA# [id number] Effective dates: 01/16/22 through 01/17/23  Patients co-pay is $4 (90 days)  Charlann Boxer, CPhT

## 2022-01-18 ENCOUNTER — Encounter (HOSPITAL_COMMUNITY)
Admission: RE | Admit: 2022-01-18 | Discharge: 2022-01-18 | Disposition: A | Discharge status: Home / Self Care | Payer: Medicaid Other | Source: Ambulatory Visit | Attending: Cardiology | Admitting: Cardiology

## 2022-01-18 ENCOUNTER — Other Ambulatory Visit (HOSPITAL_COMMUNITY): Payer: Self-pay

## 2022-01-18 DIAGNOSIS — I5022 Chronic systolic (congestive) heart failure: Secondary | ICD-10-CM

## 2022-01-18 NOTE — Progress Notes (Signed)
Scott Salinas systolic and diastolic blood pressures remain elevated at times. Scott Salinas said today he did not smoke Scott Salinas today. Will forward blood pressures to the heart failure clinic and get parameters for exercise. Intermittent PVC's occasional couplets  noted today.Will fax exercise flow sheets to Dr. Claris Gladden office for review with today's ECG tracings.Scott Pall, RN,BSN 01/18/2022 4:48 PM

## 2022-01-20 ENCOUNTER — Encounter (HOSPITAL_COMMUNITY)
Admission: RE | Admit: 2022-01-20 | Discharge: 2022-01-20 | Disposition: A | Discharge status: Home / Self Care | Payer: Medicaid Other | Source: Ambulatory Visit | Attending: Cardiology | Admitting: Cardiology

## 2022-01-20 DIAGNOSIS — I5022 Chronic systolic (congestive) heart failure: Secondary | ICD-10-CM

## 2022-01-23 ENCOUNTER — Encounter (HOSPITAL_COMMUNITY)
Admission: RE | Admit: 2022-01-23 | Discharge: 2022-01-23 | Disposition: A | Discharge status: Home / Self Care | Payer: Medicaid Other | Source: Ambulatory Visit | Attending: Cardiology | Admitting: Cardiology

## 2022-01-23 DIAGNOSIS — I5022 Chronic systolic (congestive) heart failure: Secondary | ICD-10-CM | POA: Diagnosis not present

## 2022-01-25 ENCOUNTER — Encounter (HOSPITAL_COMMUNITY)
Admission: RE | Admit: 2022-01-25 | Discharge: 2022-01-25 | Disposition: A | Discharge status: Home / Self Care | Payer: Medicaid Other | Source: Ambulatory Visit | Attending: Cardiology | Admitting: Cardiology

## 2022-01-25 DIAGNOSIS — I5022 Chronic systolic (congestive) heart failure: Secondary | ICD-10-CM

## 2022-01-25 NOTE — Progress Notes (Addendum)
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 (life sciences)   Educator Dietitian   Select Nutrition   Nutrition Calorie Density   Instruction Review Code 1- Verbalizes Understanding   Class Start Time 1400   Class Stop Time 1442   Class Time Calculation (min) 42  min    [place] Name 01/18/22 1500     Education   Cardiac Education Topics [employer]   Educator Dietitian   Weekly Topic [employer] Desserts   Instruction Review Code 1- Verbalizes Understanding   Class Start Time 1355   Class Stop Time 1445   Class Time Calculation (min) 50 min    [place] Name 01/20/22 1500     Education   Cardiac Education Topics Pritikin   Environmental consultant Psychosocial    Psychosocial Workshop Managing Moods and Relationships   Instruction Review Code 1- Verbalizes Understanding   Class Start Time 1352   Class Stop Time 1457   Class Time Calculation (min) 65 min    [place] Name 01/23/22 1500     Education   Cardiac Education Topics [place]   Select Workshops     Workshops   Educator Exercise Physiologist   Select Exercise   Exercise Workshop Exercise Basics: Building Your Action Plan   Instruction Review Code 1- Verbalizes Understanding   Class Start Time 1411   Class Stop Time 1451   Class Time Calculation (min) 40 min            Core Videos: Exercise    Move It!  Clinical staff conducted group or individual video education with verbal and written material and guidebook.  Patient learns the recommended Pritikin exercise program. Exercise with the goal of living a long, healthy life. Some of the health benefits of exercise include controlled diabetes, healthier blood pressure levels, improved cholesterol levels, improved heart and lung capacity, improved sleep, and better body composition. Everyone should speak with their doctor before starting or changing an exercise routine.  Biomechanical Limitations Clinical staff conducted group or individual video education with verbal and written material and guidebook.  Patient learns how biomechanical limitations can impact exercise and how we can mitigate and possibly overcome limitations to have an impactful and balanced exercise routine.  Body Composition Clinical staff conducted group or individual video education with verbal and written material and guidebook.  Patient learns that body composition (ratio of muscle mass to fat mass) is a key component to assessing overall fitness, rather than body weight alone. Increased fat mass, especially visceral belly fat, can put [place] at increased risk for metabolic syndrome, type 2 diabetes, heart disease, and even death. It is recommended to combine diet and  exercise (cardiovascular and resistance training) to improve your body composition. Seek guidance from your physician and exercise physiologist before implementing an exercise routine.  Exercise Action Plan Clinical staff conducted group or individual video education with verbal and written material and guidebook.  Patient learns the recommended strategies to achieve and enjoy long-term exercise adherence, including variety, self-motivation, self-efficacy, and positive decision making. Benefits of exercise include fitness, good health, weight management, more energy, better sleep, less stress, and overall well-being.  Medical   Heart Disease Risk Reduction Clinical staff conducted group or individual video education with verbal and written material and guidebook.  Patient learns our heart is our most vital organ as it circulates oxygen, nutrients, white blood cells, and hormones throughout the entire body, and carries waste away. Data supports a plant-based eating plan like the Pritikin Program for its effectiveness in slowing progression of and reversing heart disease. The video provides a number of recommendations to address heart disease.   Metabolic Syndrome and Belly Fat  Clinical staff conducted group or individual video education with verbal and written material and guidebook.  Patient learns what metabolic syndrome is, how it leads to heart disease, and how one can reverse it and keep it from coming back. You have metabolic syndrome if you have 3 of the following 5 criteria: abdominal obesity, high blood pressure, high triglycerides, low HDL cholesterol, and high blood sugar.  Hypertension and Heart Disease Clinical staff conducted group or individual video education with verbal and written material and guidebook.  Patient learns that high blood pressure, or hypertension, is very common in the [place]. Hypertension is largely due to excessive salt intake, but other important risk  factors include being overweight, physical inactivity, drinking too much alcohol, smoking, and not eating enough potassium from fruits and vegetables. High blood pressure is a leading risk factor for heart attack, stroke, congestive heart failure, dementia, kidney failure, and premature death. Long-term effects of excessive salt intake include stiffening of the arteries and thickening of heart muscle and organ damage. Recommendations include ways to reduce hypertension and the risk of heart disease.  Diseases of Our Time - Focusing on Diabetes Clinical staff conducted group or individual video education with verbal and written material and guidebook.  Patient learns why the best way to stop diseases of our time is prevention, through food and other lifestyle changes. Medicine (such as prescription pills and surgeries) is often only a Band-Aid on the problem, not a long-term solution. Most common diseases of our time include obesity, type 2 diabetes, hypertension, heart disease, and cancer. The Pritikin Program is recommended and has been proven to help reduce, reverse, and/or prevent the damaging effects of metabolic syndrome.  Nutrition   Overview of the Pritikin Eating Plan  Clinical staff conducted group or individual video education with verbal and written material and guidebook.  Patient learns about the [place] for disease risk reduction. The [place] emphasizes a wide variety of unrefined, minimally-processed carbohydrates, like fruits, vegetables, whole grains, and legumes. Go, Caution, and Stop food choices are explained. Plant-based and lean animal proteins are emphasized. Rationale provided for low sodium intake for blood pressure control, low added sugars for blood sugar stabilization, and low added fats and oils for coronary artery disease risk reduction and weight management.  Calorie Density  Clinical staff conducted group or individual video education with verbal  and written material and guidebook.  Patient learns about calorie density and how it impacts the Pritikin Eating Plan. Knowing the characteristics of the food you choose will help you decide whether those foods will lead to weight gain or weight loss, and whether you want to consume more or less of them. Weight loss is usually a side effect of the Pritikin Eating Plan because of its focus on low calorie-dense foods.  Label Reading  Clinical staff conducted group or individual video education with verbal and written material and guidebook.  Patient learns about the Pritikin recommended label reading guidelines and corresponding recommendations regarding calorie density, added sugars, sodium content, and whole grains.  Dining Out - Part 1  Clinical staff conducted group or individual video education with verbal and written material and guidebook.  Patient learns that restaurant meals can be sabotaging because they can be so high in calories, fat, sodium, and/or sugar. Patient learns recommended strategies on how to positively address this and avoid unhealthy pitfalls.  Facts on Fats  Clinical staff conducted group or individual video education with verbal and written material and guidebook.  Patient learns that lifestyle modifications can be just as effective, if not more so, as many medications for lowering your risk of heart disease. A Pritikin lifestyle can help to reduce your risk of inflammation and atherosclerosis (cholesterol build-up, or plaque, in the artery walls). Lifestyle interventions such as dietary choices and physical activity address the cause of atherosclerosis. A review of the types of fats and their impact on blood cholesterol levels, along with dietary recommendations to reduce fat intake is also included.  Nutrition Action Plan  Clinical staff conducted group or individual video education with verbal and written material and guidebook.  Patient learns how to incorporate Pritikin  recommendations into their lifestyle. Recommendations include planning and keeping personal health goals in mind as an important part of their success.  Healthy Mind-Set    Healthy Minds, Bodies, Hearts  Clinical staff conducted group or individual video education with verbal and written material and guidebook.  Patient learns how to identify when they are stressed. Video will discuss the impact of that stress, as well as the many benefits of stress management. Patient will also be introduced to stress management techniques. The way we think, act, and feel has an impact on our hearts.  How Our Thoughts Can Heal Our Hearts  Clinical staff conducted group or individual video education with verbal and written material and guidebook.  Patient learns that negative thoughts can cause depression and anxiety. This can result in negative lifestyle behavior and serious health problems. Cognitive behavioral therapy is an effective method to help control our thoughts in order to change and improve our emotional outlook.  Additional Videos:  Exercise    Improving Performance  Clinical staff conducted group or individual video education with verbal and written material and guidebook.  Patient learns to use a non-linear approach by alternating intensity levels and lengths of time spent exercising to help burn more calories and lose more body fat. Cardiovascular exercise helps improve heart health, metabolism, hormonal balance, blood sugar control, and recovery from fatigue. Resistance training improves strength, endurance, balance, coordination, reaction time, metabolism, and muscle mass. Flexibility exercise improves circulation, posture, and balance. Seek guidance from your physician and exercise physiologist before implementing an exercise routine and learn your capabilities and proper form for all exercise.  Introduction to Yoga  Clinical staff conducted group or individual video education with verbal and  written material and guidebook.  Patient learns about yoga, a discipline of the coming together of mind, breath, and body. The benefits of yoga include improved flexibility, improved range of motion, better posture and core strength, increased lung function, weight loss, and positive self-image. Yoga's heart health benefits include lowered blood pressure, healthier heart rate, decreased cholesterol and triglyceride levels, improved immune function, and reduced stress. Seek guidance from your physician and exercise physiologist before implementing an exercise routine and learn your capabilities and proper form for all exercise.  Medical   Aging: Enhancing Your Quality of Life  Clinical staff conducted group or individual video education with verbal and written material and guidebook.  Patient learns key strategies and recommendations to stay in good physical health and enhance quality of life, such as prevention strategies, having an advocate, securing a [place], and keeping a list of medications and system for tracking them. It also discusses how to avoid risk for bone loss.  Biology of Weight Control  Clinical staff conducted group or individual video education with verbal and written material and guidebook.  Patient learns that  weight gain occurs because we consume more calories than we burn (eating more, moving less). Even if your body weight is normal, you may have higher ratios of fat compared to muscle mass. Too much body fat puts you at increased risk for cardiovascular disease, heart attack, stroke, type 2 diabetes, and obesity-related cancers. In addition to exercise, following the [place] can help reduce your risk.  Decoding Lab Results  Clinical staff conducted group or individual video education with verbal and written material and guidebook.  Patient learns that lab test reflects one measurement whose values change over time and are influenced  by many factors, including medication, stress, sleep, exercise, food, hydration, pre-existing medical conditions, and more. It is recommended to use the knowledge from this video to become more involved with your lab results and evaluate your numbers to speak with your doctor.   Diseases of Our Time - Overview  Clinical staff conducted group or individual video education with verbal and written material and guidebook.  Patient learns that according to the CDC, 50% to 70% of chronic diseases (such as obesity, type 2 diabetes, elevated lipids, hypertension, and heart disease) are avoidable through lifestyle improvements including healthier food choices, listening to satiety cues, and increased physical activity.  Sleep Disorders Clinical staff conducted group or individual video education with verbal and written material and guidebook.  Patient learns how good quality and duration of sleep are important to overall health and well-being. Patient also learns about sleep disorders and how they impact health along with recommendations to address them, including discussing with a physician.  Nutrition  Dining Out - Part 2 Clinical staff conducted group or individual video education with verbal and written material and guidebook.  Patient learns how to plan ahead and communicate in order to maximize their dining experience in a healthy and nutritious manner. Included are recommended food choices based on the type of restaurant the patient is visiting.   Fueling a Best boy conducted group or individual video education with verbal and written material and guidebook.  There is a strong connection between our food choices and our health. Diseases like obesity and type 2 diabetes are very prevalent and are in large-part due to lifestyle choices. The Pritikin Eating Plan provides plenty of food and hunger-curbing satisfaction. It is easy to follow, affordable, and helps reduce health  risks.  Menu Workshop  Clinical staff conducted group or individual video education with verbal and written material and guidebook.  Patient learns that restaurant meals can sabotage health goals because they are often packed with calories, fat, sodium, and sugar. Recommendations include strategies to plan ahead and to communicate with the manager, chef, or server to help order a healthier meal.  Planning Your Eating Strategy  Clinical staff conducted group or individual video education with verbal and written material and guidebook.  Patient learns about the [place] and its benefit of reducing the risk of disease. The [place] does not focus on calories. Instead, it emphasizes high-quality, nutrient-rich foods. By knowing the characteristics of the foods, we choose, we can determine their calorie density and make informed decisions.  Targeting Your Nutrition Priorities  Clinical staff conducted group or individual video education with verbal and written material and guidebook.  Patient learns that lifestyle habits have a tremendous impact on disease risk and progression. This video provides eating and physical activity recommendations based on your personal health goals, such as reducing LDL cholesterol, losing weight, preventing or  controlling type 2 diabetes, and reducing high blood pressure.  Vitamins and Minerals  Clinical staff conducted group or individual video education with verbal and written material and guidebook.  Patient learns different ways to obtain key vitamins and minerals, including through a recommended healthy diet. It is important to discuss all supplements you take with your doctor.   Healthy Mind-Set    Smoking Cessation  Clinical staff conducted group or individual video education with verbal and written material and guidebook.  Patient learns that cigarette smoking and tobacco addiction pose a serious health risk which affects millions of  people. Stopping smoking will significantly reduce the risk of heart disease, lung disease, and many forms of cancer. Recommended strategies for quitting are covered, including working with your doctor to develop a successful plan.  Culinary   Becoming a Financial trader conducted group or individual video education with verbal and written material and guidebook.  Patient learns that cooking at home can be healthy, cost-effective, quick, and puts them in control. Keys to cooking healthy recipes will include looking at your recipe, assessing your equipment needs, planning ahead, making it simple, choosing cost-effective seasonal ingredients, and limiting the use of added fats, salts, and sugars.  Cooking - Breakfast and Snacks  Clinical staff conducted group or individual video education with verbal and written material and guidebook.  Patient learns how important breakfast is to satiety and nutrition through the entire day. Recommendations include key foods to eat during breakfast to help stabilize blood sugar levels and to prevent overeating at meals later in the day. Planning ahead is also a key component.  Cooking - Human resources officer conducted group or individual video education with verbal and written material and guidebook.  Patient learns eating strategies to improve overall health, including an approach to cook more at home. Recommendations include thinking of animal protein as a side on your plate rather than center stage and focusing instead on lower calorie dense options like vegetables, fruits, whole grains, and plant-based proteins, such as beans. Making sauces in large quantities to freeze for later and leaving the skin on your vegetables are also recommended to maximize your experience.  Cooking - Healthy Salads and Dressing Clinical staff conducted group or individual video education with verbal and written material and guidebook.  Patient learns that  vegetables, fruits, whole grains, and legumes are the foundations of the [place]. Recommendations include how to incorporate each of these in flavorful and healthy salads, and how to create homemade salad dressings. Proper handling of ingredients is also covered. Cooking - Soups and [employer] - Soups and Desserts Clinical staff conducted group or individual video education with verbal and written material and guidebook.  Patient learns that Pritikin soups and desserts make for easy, nutritious, and delicious snacks and meal components that are low in sodium, fat, sugar, and calorie density, while high in vitamins, minerals, and filling fiber. Recommendations include simple and healthy ideas for soups and desserts.   Overview     The Pritikin Solution Program Overview Clinical staff conducted group or individual video education with verbal and written material and guidebook.  Patient learns that the results of the [place] Program have been documented in more than 100 articles published in peer-reviewed journals, and the benefits include reducing risk factors for (and, in some cases, even reversing) high cholesterol, high blood pressure, type 2 diabetes, obesity, and more! An overview of the three key pillars of the Pritikin  Program will be covered: eating well, doing regular exercise, and having a healthy mind-set.  WORKSHOPS  Exercise: Exercise Basics: Building Your Action Plan Clinical staff led group instruction and group discussion with PowerPoint presentation and patient guidebook. To enhance the learning environment the use of posters, models and videos may be added. At the conclusion of this workshop, patients will comprehend the difference between physical activity and exercise, as well as the benefits of incorporating both, into their routine. Patients will understand the FITT (Frequency, Intensity, Time, and Type) principle and how to use it to build an exercise action  plan. In addition, safety concerns and other considerations for exercise and cardiac rehab will be addressed by the presenter. The purpose of this lesson is to promote a comprehensive and effective weekly exercise routine in order to improve patients' overall level of fitness.   Managing Heart Disease: Your Path to a Healthier Heart Clinical staff led group instruction and group discussion with PowerPoint presentation and patient guidebook. To enhance the learning environment the use of posters, models and videos may be added.At the conclusion of this workshop, patients will understand the anatomy and physiology of the heart. Additionally, they will understand how Pritikin's three pillars impact the risk factors, the progression, and the management of heart disease.  The purpose of this lesson is to provide a high-level overview of the heart, heart disease, and how the Pritikin lifestyle positively impacts risk factors.  Exercise Biomechanics Clinical staff led group instruction and group discussion with PowerPoint presentation and patient guidebook. To enhance the learning environment the use of posters, models and videos may be added. Patients will learn how the structural parts of their bodies function and how these functions impact their daily activities, movement, and exercise. Patients will learn how to promote a neutral spine, learn how to manage pain, and identify ways to improve their physical movement in order to promote healthy living. The purpose of this lesson is to expose patients to common physical limitations that impact physical activity. Participants will learn practical ways to adapt and manage aches and pains, and to minimize their effect on regular exercise. Patients will learn how to maintain good posture while sitting, walking, and lifting.  Balance Training and Fall Prevention  Clinical staff led group instruction and group discussion with PowerPoint presentation and  patient guidebook. To enhance the learning environment the use of posters, models and videos may be added. At the conclusion of this workshop, patients will understand the importance of their sensorimotor skills (vision, proprioception, and the vestibular system) in maintaining their ability to balance as they age. Patients will apply a variety of balancing exercises that are appropriate for their current level of function. Patients will understand the common causes for poor balance, possible solutions to these problems, and ways to modify their physical environment in order to minimize their fall risk. The purpose of this lesson is to teach patients about the importance of maintaining balance as they age and ways to minimize their risk of falling.  WORKSHOPS   Nutrition:  Fueling a Scientist, research (physical sciences) led group instruction and group discussion with PowerPoint presentation and patient guidebook. To enhance the learning environment the use of posters, models and videos may be added. Patients will review the foundational principles of the [place] and understand what constitutes a serving size in each of the food groups. Patients will also learn Pritikin-friendly foods that are better choices when away from home and review make-ahead meal and snack options.  Calorie density will be reviewed and applied to three nutrition priorities: weight maintenance, weight loss, and weight gain. The purpose of this lesson is to reinforce (in a group setting) the key concepts around what patients are recommended to eat and how to apply these guidelines when away from home by planning and selecting Pritikin-friendly options. Patients will understand how calorie density may be adjusted for different weight management goals.  Mindful Eating  Clinical staff led group instruction and group discussion with PowerPoint presentation and patient guidebook. To enhance the learning environment the use of posters,  models and videos may be added. Patients will briefly review the concepts of the [place] and the importance of low-calorie dense foods. The concept of mindful eating will be introduced as well as the importance of paying attention to internal hunger signals. Triggers for non-hunger eating and techniques for dealing with triggers will be explored. The purpose of this lesson is to provide patients with the opportunity to review the basic principles of the [place], discuss the value of eating mindfully and how to measure internal cues of hunger and fullness using the Hunger Scale. Patients will also discuss reasons for non-hunger eating and learn strategies to use for controlling emotional eating.  Targeting Your Nutrition Priorities Clinical staff led group instruction and group discussion with PowerPoint presentation and patient guidebook. To enhance the learning environment the use of posters, models and videos may be added. Patients will learn how to determine their genetic susceptibility to disease by reviewing their family history. Patients will gain insight into the importance of diet as part of an overall healthy lifestyle in mitigating the impact of genetics and other environmental insults. The purpose of this lesson is to provide patients with the opportunity to assess their personal nutrition priorities by looking at their family history, their own health history and current risk factors. Patients will also be able to discuss ways of prioritizing and modifying the [place] for their highest risk areas  Menu  Clinical staff led group instruction and group discussion with PowerPoint presentation and patient guidebook. To enhance the learning environment the use of posters, models and videos may be added. Using menus brought in from [employer], or printed from [employer], patients will apply the [place] dining out guidelines that were presented in the  [employer] video. Patients will also be able to practice these guidelines in a variety of provided scenarios. The purpose of this lesson is to provide patients with the opportunity to practice hands-on learning of the [place] with actual menus and practice scenarios.  Label Reading Clinical staff led group instruction and group discussion with PowerPoint presentation and patient guidebook. To enhance the learning environment the use of posters, models and videos may be added. Patients will review and discuss the [facility] label reading guidelines presented in [facility]'s Label Reading Educational series video. Using fool labels brought in from local grocery stores and markets, patients will apply the label reading guidelines and determine if the packaged food meet the [facility] guidelines. The purpose of this lesson is to provide patients with the opportunity to review, discuss, and practice hands-on learning of the [facility] Label Reading guidelines with actual packaged food labels. [place] Workshops are designed to teach patients ways to prepare quick, simple, and affordable recipes at home. The importance of nutrition's role in chronic disease risk reduction is reflected in its emphasis in the overall [facility] program. By  learning how to prepare essential core [facility] Eating Plan recipes, patients will increase control over what they eat; be able to customize the flavor of foods without the use of added salt, sugar, or fat; and improve the quality of the food they consume. By learning a set of core recipes which are easily assembled, quickly prepared, and affordable, patients are more likely to prepare more healthy foods at home. These workshops focus on convenient breakfasts, simple entres, side dishes, and desserts which can be prepared with minimal effort and are consistent with nutrition recommendations for cardiovascular risk  reduction. Cooking [employer] are taught by a Engineer, materials (RD) who has been trained by the [employer]. The chef or RD has a clear understanding of the importance of minimizing - if not completely eliminating - added fat, sugar, and sodium in recipes. Throughout the series of [place] Workshop sessions, patients will learn about healthy ingredients and efficient methods of cooking to build confidence in their capability to prepare    Cooking School weekly topics:  Adding Flavor- Sodium-Free  Fast and Healthy Breakfasts  Powerhouse Plant-Based Proteins  Satisfying Salads and Dressings  Simple Sides and Sauces  International Cuisine-Spotlight on the [place] Zones  Delicious Desserts  Savory Soups  Efficiency Cooking - Meals in a Snap  Tasty Appetizers and Snacks  Comforting Weekend Breakfasts  One-Pot Wonders   Fast Evening Meals  Easy [place] (Psychosocial): New Thoughts, New Behaviors Clinical staff led group instruction and group discussion with PowerPoint presentation and patient guidebook. To enhance the learning environment the use of posters, models and videos may be added. Patients will learn and practice techniques for developing effective health and lifestyle goals. Patients will be able to effectively apply the goal setting process learned to develop at least one new personal goal.  The purpose of this lesson is to expose patients to a new skill set of behavior modification techniques such as techniques setting SMART goals, overcoming barriers, and achieving new thoughts and new behaviors.  Managing Moods and Relationships Clinical staff led group instruction and group discussion with PowerPoint presentation and patient guidebook. To enhance the learning environment the use of posters, models and videos may be added. Patients will learn how emotional and chronic stress  factors can impact their health and relationships. They will learn healthy ways to manage their moods and utilize positive coping mechanisms. In addition, ICR patients will learn ways to improve communication skills. The purpose of this lesson is to expose patients to ways of understanding how one's mood and health are intimately connected. Developing a healthy outlook can help build positive relationships and connections with others. Patients will understand the importance of utilizing effective communication skills that include actively listening and being heard. They will learn and understand the importance of the "4 Cs" and especially Connections in fostering of a Healthy Mind-Set.  Healthy Sleep for a Healthy Heart Clinical staff led group instruction and group discussion with PowerPoint presentation and patient guidebook. To enhance the learning environment the use of posters, models and videos may be added. At the conclusion of this workshop, patients will be able to demonstrate knowledge of the importance of sleep to overall health, well-being, and quality of life. They will understand the symptoms of, and treatments for, common sleep disorders. Patients will also be able to identify daytime and nighttime behaviors which impact sleep, and they will be able to apply these  tools to help manage sleep-related challenges. The purpose of this lesson is to provide patients with a general overview of sleep and outline the importance of quality sleep. Patients will learn about a few of the most common sleep disorders. Patients will also be introduced to the concept of "sleep hygiene," and discover ways to self-manage certain sleeping problems through simple daily behavior changes. Finally, the workshop will motivate patients by clarifying the links between quality sleep and their goals of heart-healthy living.   Recognizing and Reducing Stress Clinical staff led group instruction and group discussion with  PowerPoint presentation and patient guidebook. To enhance the learning environment the use of posters, models and videos may be added. At the conclusion of this workshop, patients will be able to understand the types of stress reactions, differentiate between acute and chronic stress, and recognize the impact that chronic stress has on their health. They will also be able to apply different coping mechanisms, such as reframing negative self-talk. Patients will have the opportunity to practice a variety of stress management techniques, such as deep abdominal breathing, progressive muscle relaxation, and/or guided imagery.  The purpose of this lesson is to educate patients on the role of stress in their lives and to provide healthy techniques for coping with it.  Learning Barriers/Preferences:  Learning Barriers/Preferences - 01/03/22 1116       Learning Barriers/Preferences   Learning Barriers Sight   wears glasses   Learning Preferences Audio;Skilled Demonstration;Computer/Internet;Verbal Instruction;Group Instruction;Video;Individual Instruction;Written Material;Pictoral             Education Topics:  Knowledge Questionnaire Score:  Knowledge Questionnaire Score - 01/03/22 1116       Knowledge Questionnaire Score   Pre Score 23/28             Core Components/Risk Factors/Patient Goals at Admission:  Personal Goals and Risk Factors at Admission - 01/03/22 1125       Core Components/Risk Factors/Patient Goals on Admission    Weight Management Yes;Obesity;Weight Loss    Intervention Weight Management: Develop a combined nutrition and exercise program designed to reach desired caloric intake, while maintaining appropriate intake of nutrient and fiber, sodium and fats, and appropriate energy expenditure required for the weight goal.;Weight Management: Provide education and appropriate resources to help participant work on and attain dietary goals.;Weight Management/Obesity: Establish  reasonable short term and long term weight goals.;Obesity: Provide education and appropriate resources to help participant work on and attain dietary goals.    Admit Weight 238 lb 15.7 oz (108.4 kg)    Expected Outcomes Long Term: Adherence to nutrition and physical activity/exercise program aimed toward attainment of established weight goal;Short Term: Continue to assess and modify interventions until short term weight is achieved;Weight Loss: Understanding of general recommendations for a balanced deficit meal plan, which promotes 1-2 lb weight loss per week and includes a negative energy balance of [id number] kcal/d;Understanding of distribution of calorie intake throughout the day with the consumption of 4-5 meals/snacks;Understanding recommendations for meals to include 15-35% energy as protein, 25-35% energy from fat, 35-60% energy from carbohydrates, less than 200mg  of dietary cholesterol, 20-35 gm of total fiber daily    Tobacco Cessation Yes    Number of packs per day .5    Intervention Assist the participant in steps to quit. Provide individualized education and counseling about committing to Tobacco Cessation, relapse prevention, and pharmacological support that can be provided by physician.;Advice worker, assist with locating and accessing local/national Quit Smoking programs, and support quit date choice.  Expected Outcomes Short Term: Will demonstrate readiness to quit, by selecting a quit date.;Short Term: Will quit all tobacco product use, adhering to prevention of relapse plan.;Long Term: Complete abstinence from all tobacco products for at least 12 months from quit date.    Heart Failure Yes    Intervention Provide a combined exercise and nutrition program that is supplemented with education, support and counseling about heart failure. Directed toward relieving symptoms such as shortness of breath, decreased exercise tolerance, and extremity edema.    Expected Outcomes Long  term: Adoption of self-care skills and reduction of barriers for early signs and symptoms recognition and intervention leading to self-care maintenance.;Short term: Daily weights obtained and reported for increase. Utilizing diuretic protocols set by physician.;Short term: Attendance in program 2-3 days a week with increased exercise capacity. Reported lower sodium intake. Reported increased fruit and vegetable intake. Reports medication compliance.;Improve functional capacity of life    Hypertension Yes    Intervention Provide education on lifestyle modifcations including regular physical activity/exercise, weight management, moderate sodium restriction and increased consumption of fresh fruit, vegetables, and low fat dairy, alcohol moderation, and smoking cessation.;Monitor prescription use compliance.    Expected Outcomes Short Term: Continued assessment and intervention until BP is < 140/56mm HG in hypertensive participants. < 130/63mm HG in hypertensive participants with diabetes, heart failure or chronic kidney disease.;Long Term: Maintenance of blood pressure at goal levels.    Lipids Yes    Intervention Provide education and support for participant on nutrition & aerobic/resistive exercise along with prescribed medications to achieve LDL 70mg , HDL >40mg .    Expected Outcomes Long Term: Cholesterol controlled with medications as prescribed, with individualized exercise RX and with personalized nutrition plan. Value goals: LDL < 70mg , HDL > 40 mg.;Short Term: Participant states understanding of desired cholesterol values and is compliant with medications prescribed. Participant is following exercise prescription and nutrition guidelines.    Stress Yes    Intervention Offer individual and/or small group education and counseling on adjustment to heart disease, stress management and health-related lifestyle change. Teach and support self-help strategies.;Refer participants experiencing significant  psychosocial distress to appropriate mental health specialists for further evaluation and treatment. When possible, include family members and significant others in education/counseling sessions.    Expected Outcomes Short Term: Participant demonstrates changes in health-related behavior, relaxation and other stress management skills, ability to obtain effective social support, and compliance with psychotropic medications if prescribed.;Long Term: Emotional wellbeing is indicated by absence of clinically significant psychosocial distress or social isolation.             Core Components/Risk Factors/Patient Goals Review:   Goals and Risk Factor Review     Row Name 01/25/22 1154             Core Components/Risk Factors/Patient Goals Review   Personal Goals Review Weight Management/Obesity;Heart Failure;Stress;Hypertension;Lipids;Tobacco Cessation       Review Chrishas been doing well with exercise at a low level due to continued BP elevations and deconditoning. Dr [name] has been notifed about BP elelvations. BP's have been variable.[name] continue to smoke cigarettes and marajuana. Will continue to montior BP. [name] is enjoying participating in cardiac rehab.       Expected Outcomes [name] will continue to participate in traditional/ intensvie cardiac rehab for exercise, nutrition and lifestyle modifications                Core Components/Risk Factors/Patient Goals at Discharge (Final Review):   Goals and Risk Factor Review - 01/25/22 1154  Core Components/Risk Factors/Patient Goals Review   Personal Goals Review Weight Management/Obesity;Heart Failure;Stress;Hypertension;Lipids;Tobacco Cessation    Review Chrishas been doing well with exercise at a low level due to continued BP elevations and deconditoning. Dr [name] has been notifed about BP elelvations. BP's have been variable.[name] continue to smoke cigarettes and marajuana. Will continue to montior BP. [name] is enjoying  participating in cardiac rehab.    Expected Outcomes [name] will continue to participate in traditional/ intensvie cardiac rehab for exercise, nutrition and lifestyle modifications             ITP Comments:  ITP Comments     Row Name 01/03/22 0843 01/25/22 1115         ITP Comments Dr Fransico Him MD, Medical Director, Introduction to [facility] Education Program/ Intensive Cardiac Rehab. Initial Orientation Packet Reviewed with the patient 30 Day ITP Review. [name] has good attendance and participation in cardiac rehab               Comments: See ITP comments.Harrell Gave RN BSN

## 2022-01-25 NOTE — Progress Notes (Signed)
CARDIAC REHAB PHASE 2  Reviewed home exercise with pt today. Pt is tolerating exercise well. Pt will continue to exercise on his own by going to [employer] for 30-45 minutes per session 2 days a week in addition to the 3 days in [facility]. Advised pt on THRR, RPE scale, hydration and temperature/humidity precautions. Reinforced , S/S to stop exercise and when to call MD vs 911. Encouraged warm up cool down and stretches with exercise sessions. Pt verbalized understanding, all questions were answered and pt was given a copy to take home.    Kirby Funk ACSM-CEP 01/25/2022 4:36 PM

## 2022-01-26 ENCOUNTER — Encounter (HOSPITAL_COMMUNITY): Payer: Self-pay | Admitting: *Deleted

## 2022-01-26 NOTE — Progress Notes (Signed)
Received fax back from [facility], pt is sch to see Dr Posey Pronto there on 02/23/22 at 3:40 pm

## 2022-01-27 ENCOUNTER — Encounter (HOSPITAL_COMMUNITY): Payer: Medicaid Other

## 2022-01-30 ENCOUNTER — Encounter (HOSPITAL_COMMUNITY)
Admission: RE | Admit: 2022-01-30 | Discharge: 2022-01-30 | Disposition: A | Discharge status: Home / Self Care | Payer: Medicaid Other | Source: Ambulatory Visit | Attending: Cardiology | Admitting: Cardiology

## 2022-01-30 DIAGNOSIS — I5022 Chronic systolic (congestive) heart failure: Secondary | ICD-10-CM | POA: Diagnosis not present

## 2022-02-01 ENCOUNTER — Encounter (HOSPITAL_COMMUNITY)
Admission: RE | Admit: 2022-02-01 | Discharge: 2022-02-01 | Disposition: A | Discharge status: Home / Self Care | Payer: Medicaid Other | Source: Ambulatory Visit | Attending: Cardiology | Admitting: Cardiology

## 2022-02-01 DIAGNOSIS — I5022 Chronic systolic (congestive) heart failure: Secondary | ICD-10-CM | POA: Diagnosis not present

## 2022-02-03 ENCOUNTER — Encounter (HOSPITAL_COMMUNITY): Payer: Medicaid Other

## 2022-02-06 ENCOUNTER — Encounter (HOSPITAL_COMMUNITY): Payer: Medicaid Other

## 2022-02-08 ENCOUNTER — Encounter (HOSPITAL_COMMUNITY)
Admission: RE | Admit: 2022-02-08 | Discharge: 2022-02-08 | Disposition: A | Discharge status: Home / Self Care | Payer: Medicaid Other | Source: Ambulatory Visit | Attending: Cardiology | Admitting: Cardiology

## 2022-02-08 DIAGNOSIS — I5022 Chronic systolic (congestive) heart failure: Secondary | ICD-10-CM | POA: Insufficient documentation

## 2022-02-08 DIAGNOSIS — I252 Old myocardial infarction: Secondary | ICD-10-CM | POA: Insufficient documentation

## 2022-02-08 NOTE — Progress Notes (Signed)
Scott Salinas reports having right hip pain since Friday. Scott Salinas rates his pain 7/10. Scott Salinas wanted to resume exercise today. I advise Scott Salinas not to exercise today. Scott Salinas said that he does not like to take pain medication. Blood pressure 122/92 heart rate 76. Scott Salinas says he does not have an appointment to see the orthopedist until October 23 rd. Scott Salinas says he hopes to return to exercise on Friday if he is feeling better.Barnet Pall, RN,BSN 02/08/2022 4:33 PM

## 2022-02-10 ENCOUNTER — Encounter (HOSPITAL_COMMUNITY)
Admission: RE | Admit: 2022-02-10 | Discharge: 2022-02-10 | Disposition: A | Discharge status: Home / Self Care | Payer: Medicaid Other | Source: Ambulatory Visit | Attending: Cardiology | Admitting: Cardiology

## 2022-02-10 DIAGNOSIS — I5022 Chronic systolic (congestive) heart failure: Secondary | ICD-10-CM

## 2022-02-10 DIAGNOSIS — I252 Old myocardial infarction: Secondary | ICD-10-CM | POA: Diagnosis not present

## 2022-02-13 ENCOUNTER — Encounter (HOSPITAL_COMMUNITY)
Admission: RE | Admit: 2022-02-13 | Discharge: 2022-02-13 | Disposition: A | Discharge status: Home / Self Care | Payer: Medicaid Other | Source: Ambulatory Visit | Attending: Cardiology | Admitting: Cardiology

## 2022-02-13 DIAGNOSIS — I5022 Chronic systolic (congestive) heart failure: Secondary | ICD-10-CM

## 2022-02-14 ENCOUNTER — Encounter (HOSPITAL_BASED_OUTPATIENT_CLINIC_OR_DEPARTMENT_OTHER): Payer: Self-pay | Admitting: Internal Medicine

## 2022-02-14 ENCOUNTER — Ambulatory Visit (INDEPENDENT_AMBULATORY_CARE_PROVIDER_SITE_OTHER): Payer: Medicaid Other | Admitting: Internal Medicine

## 2022-02-14 VITALS — BP 138/82 | HR 72 | Ht 70.0 in | Wt 245.9 lb

## 2022-02-14 DIAGNOSIS — E7800 Pure hypercholesterolemia, unspecified: Secondary | ICD-10-CM | POA: Diagnosis not present

## 2022-02-14 DIAGNOSIS — G720 Drug-induced myopathy: Secondary | ICD-10-CM

## 2022-02-14 DIAGNOSIS — Z79899 Other long term (current) drug therapy: Secondary | ICD-10-CM | POA: Diagnosis not present

## 2022-02-14 DIAGNOSIS — I2510 Atherosclerotic heart disease of native coronary artery without angina pectoris: Secondary | ICD-10-CM | POA: Diagnosis not present

## 2022-02-14 DIAGNOSIS — I5022 Chronic systolic (congestive) heart failure: Secondary | ICD-10-CM

## 2022-02-14 DIAGNOSIS — T466X5A Adverse effect of antihyperlipidemic and antiarteriosclerotic drugs, initial encounter: Secondary | ICD-10-CM
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
## 2022-02-14 NOTE — Patient Instructions (Signed)
Medication Instructions:  Your Physician recommend you continue on your current medication as directed.    Will be in touch regarding Repatha   *If you need a refill on your cardiac medications before your next appointment, please call your pharmacy*   Lab Work: Your provider has recommended lab work today (NMR, [place]). Please have this collected at [facility] at [place]. The lab is open 8:00 am - 4:30 pm. Please avoid 12:00p - 1:00p for lunch hour. You do not need an appointment. Please go to [address] [place], [place] [address]. This is in the Primary Care office on the 3rd floor, let them know you are there for blood work and they will direct you to the lab.  If you have labs (blood work) drawn today and your tests are completely normal, you will receive your results only by: [place] (if you have MyChart) OR A paper copy in the mail If you have any lab test that is abnormal or we need to change your treatment, we will call you to review the results.   Testing/Procedures: None ordered today   Follow-Up: At [facility], you and your health needs are our priority.  As part of our continuing mission to provide you with exceptional heart care, we have created designated Provider Care Teams.  These Care Teams include your primary Cardiologist (physician) and Advanced Practice Providers (APPs -  Physician Assistants and Nurse Practitioners) who all work together to provide you with the care you need, when you need it.  We recommend signing up for the patient portal called "MyChart".  Sign up information is provided on this After Visit Summary.  MyChart is used to connect with patients for Virtual Visits (Telemedicine).  Patients are able to view lab/test results, encounter notes, upcoming appointments, etc.  Non-urgent messages can be sent to your provider as well.   To learn more about what you can do with MyChart, go to [email or web address].     Your next appointment:   3-4 month(s)  The format for your next appointment:   In Person  Provider:   K. Mali Hilty, MD

## 2022-02-15 ENCOUNTER — Encounter (HOSPITAL_COMMUNITY)
Admission: RE | Admit: 2022-02-15 | Discharge: 2022-02-15 | Disposition: A | Discharge status: Home / Self Care | Payer: Medicaid Other | Source: Ambulatory Visit | Attending: Cardiology | Admitting: Cardiology

## 2022-02-15 DIAGNOSIS — I5022 Chronic systolic (congestive) heart failure: Secondary | ICD-10-CM

## 2022-02-15 LAB — NMR, LIPOPROFILE
Cholesterol, Total: 256 mg/dL — ABNORMAL HIGH (ref 100–199)
HDL Particle Number: 24.4 umol/L — ABNORMAL LOW (ref >=30.5)
HDL-C: 45 mg/dL (ref >39)
LDL Particle Number: 2254 nmol/L — ABNORMAL HIGH (ref <1000)
LDL Size: 21 nm (ref >20.5)
LDL-C (NIH Calc): 177 mg/dL — ABNORMAL HIGH (ref 0–99)
LP-IR Score: 77 — ABNORMAL HIGH (ref <=45)
Small LDL Particle Number: 1198 nmol/L — ABNORMAL HIGH (ref <=527)
Triglycerides: 180 mg/dL — ABNORMAL HIGH (ref 0–149)

## 2022-02-15 LAB — LIPOPROTEIN A (LPA): Lipoprotein (a): 250.2 nmol/L — ABNORMAL HIGH (ref <75.0)

## 2022-02-15 NOTE — Progress Notes (Signed)
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       Goals   Current Weight 241 lb 2.9 oz (109.4 kg)    Comment No new labs at this time; most recent labs show Cr 2.52, BUN 24, GFR 30, cholesterol 250, trigylceries 159, LDL 178. ChGerald Stabss up 3# since starting with our program.    Expected Outcome ChGerald Stabsemains contemplative of nutrition goals. He does enjoy a wide variety of foods though he does continue to choose fast food/ restaurant foods weekly. He reports 'juicing" frequently; have recommended the importance of high fiber intake to support LDL cholesterol. Gave handouts today on understanding kidney function and blood pressure and reviewed reducing sodium intake to <[id number] Stabsntinues to ask about a wide variety of nutrition supplements (vegan testosterone, nitric oxide, Four  Sigmatic, etc); I have recommended not taking due to kidney disease, medication interactions, etc.             Psychosocial: Target Goals: Acknowledge presence or absence of significant depression and/or stress, maximize coping skills, provide positive support system. Participant is able to  verbalize types and ability to use techniques and skills needed for reducing stress and depression.  Initial Review & Psychosocial Screening:  Initial Psych Review & Screening - 01/03/22 0942       Initial Review   Current issues with Current Stress Concerns;Current Sleep Concerns;History of Depression    Source of Stress Concerns Chronic Illness;Poor Coping Skills;Financial;Unable to participate in former interests or hobbies    Comments [name] says he is intersted in counselling as he was recently evicted and does not have a place to stay. left a message with the heart failure social worker to see if patient can receive assistance with finding a place to stay. [name] says he does not have any support in the area. [name] mother lives in [place] and two children live out of town.      Family Dynamics   Good Support System? No    Strains Illness and family care strain    Concerns No support system    Comments Will contact the heart failure social worker as Mr Irani says he was recently convicted from his home      Barriers   Psychosocial barriers to participate in program The patient should benefit from training in stress management and relaxation.      Screening Interventions   Interventions Encouraged to exercise;To provide support and resources with identified psychosocial needs;Provide feedback about the scores to participant    Expected Outcomes Long Term Goal: Stressors or current issues are controlled or eliminated.;Short Term goal: Identification and review with participant of any Quality of Life or Depression concerns found by scoring the questionnaire.;Long Term goal: The participant improves  quality of Life and PHQ9 Scores as seen by post scores and/or verbalization of changes   interested in counselling does not have anyone to talk to            Quality of Life Scores:  Quality of Life - 01/03/22 1115       Quality of Life   Select Quality of Life      Quality of Life Scores   Health/Function Pre 16.4 %    Socioeconomic Pre 6.13 %    Psych/Spiritual Pre 4.71 %    Family Pre 12.3 %    GLOBAL Pre 11.13 %            Scores of 19 and below usually indicate a poorer quality of life in these areas.  A difference of  2-3 points is a clinically meaningful difference.  A difference of 2-3 points in the total score of the Quality of Life Index has been associated with significant improvement in overall quality of life, self-image, physical symptoms, and general health in studies assessing change in quality of life.  PHQ-9: Review Flowsheet       01/03/2022  Depression screen PHQ 2/9  Decreased Interest 1  Down, Depressed, Hopeless 0  PHQ - 2 Score 1   Interpretation of Total Score  Total Score Depression Severity:  1-4 = Minimal depression, 5-9 = Mild depression, 10-14 = Moderate depression, 15-19 = Moderately severe depression, 20-27 = Severe depression   Psychosocial Evaluation and Intervention:   Psychosocial Re-Evaluation:  Psychosocial Re-Evaluation     Row Name 01/25/22 1117 02/15/22 0936           Psychosocial Re-Evaluation   Current issues with Current Stress Concerns;History of Depression;Current Sleep Concerns Current Stress Concerns;History of Depression;Current Sleep Concerns      Comments [name] continues to have stress concerns regarding his  living situation, [name] has a list of places to check for housing. [name] also has resources for counselling. [name] continues to have stress concerns regarding his living situation, [name] has a list of places to check for housing. [name] also has resources for counselling. [name] say he is on a waiting list for  housing.      Expected Outcomes [name] will have controlled or decreased stress and depression upon completion of cardiac rehab [name] will have controlled or decreased stress and depression upon completion of cardiac rehab      Interventions Stress management education;Encouraged to attend Cardiac Rehabilitation for the exercise;Relaxation education Stress management education;Encouraged to attend Cardiac Rehabilitation for the exercise;Relaxation education      Continue Psychosocial Services  Follow up required by staff Follow up required by staff        Initial Review   Source of Stress Concerns Family;Chronic Illness;Unable to participate in former interests or hobbies;Unable to perform yard/household activities;Financial Family;Chronic Illness;Unable to participate in former interests or hobbies;Unable to perform yard/household activities;Financial      Comments Will  continue to monitor and offer support as needed Will  continue to monitor and offer support as needed               Psychosocial Discharge (Final Psychosocial Re-Evaluation):  Psychosocial Re-Evaluation - 02/15/22 0936       Psychosocial Re-Evaluation   Current issues with Current Stress Concerns;History of Depression;Current Sleep Concerns    Comments [name] continues to have stress concerns regarding his living situation, [name] has a list of places to check for housing. [name] also has resources for counselling. [name] say he is on a waiting list for housing.    Expected Outcomes [name] will have controlled or decreased stress and depression upon completion of cardiac rehab    Interventions Stress management education;Encouraged to attend Cardiac Rehabilitation for the exercise;Relaxation education    Continue Psychosocial Services  Follow up required by staff      Initial Review   Source of Stress Concerns Family;Chronic Illness;Unable to participate in former interests or hobbies;Unable to perform yard/household  activities;Financial    Comments Will  continue to monitor and offer support as needed             Vocational Rehabilitation: Provide vocational rehab assistance to qualifying candidates.   Vocational Rehab Evaluation & Intervention:  Vocational Rehab - 01/03/22 0846       Initial Vocational Rehab Evaluation & Intervention   Assessment shows need for Vocational Rehabilitation No   [name] is disabled and does not need vocational rehab at this time            Education: Education Goals: Education classes will be provided on a weekly basis, covering required topics. Participant will state understanding/return demonstration of topics presented.    Education     Row Name 01/11/22 1600     Education   Cardiac Education Topics [employer]   Educator Dietitian   Weekly Topic Tasty Appetizers and Snacks   Instruction Review Code 1- Verbalizes Understanding   Class Start Time 1400   Class Stop Time 1455   Class Time Calculation (min) 55 min    [place] Name 01/13/22 1500     Education   Cardiac Education Topics Pritikin   Lexicographer Nutrition   Nutrition Other  Label Reading   Instruction  Review Code 1- Verbalizes Understanding   Class Start Time 1358   Class Stop Time 1450   Class Time Calculation (min) 52 min    Row Name 01/16/22 1500     Education   Cardiac Education Topics [facility]in   Scientist, research (life sciences)   Educator Dietitian   Select Nutrition   Nutrition Calorie Density   Instruction Review Code 1- Verbalizes Understanding   Class Start Time 1400   Class Stop Time 1442   Class Time Calculation (min) 42 min    [place] Name 01/18/22 1500     Education   Cardiac Education Topics [employer]   Educator Dietitian   Weekly Topic Delicious Desserts   Instruction Review Code 1- Verbalizes Understanding   Class  Start Time 1355   Class Stop Time 1445   Class Time Calculation (min) 50 min    [place] Name 01/20/22 1500     Education   Cardiac Education Topics [facility]in   Environmental consultant Psychosocial   Psychosocial Workshop Managing Moods and Relationships   Instruction Review Code 1- Verbalizes Understanding   Class Start Time 1352   Class Stop Time 1457   Class Time Calculation (min) 65 min    [place] Name 01/23/22 1500     Education   Cardiac Education Topics [place]   Select Workshops     Workshops   Educator Exercise Physiologist   Select Exercise   Exercise Workshop Exercise Basics: Building Your Action Plan   Instruction Review Code 1- Verbalizes Understanding   Class Start Time 1411   Class Stop Time 1451   Class Time Calculation (min) 40 min    [place] Name 01/30/22 1500     Education   Cardiac Education Topics Pritikin   Scientist, research (life sciences)   Educator Dietitian   Nutrition Nutrition Action Plan   Instruction Review Code 1- Verbalizes Understanding   Class Start Time 1400   Class Stop Time 1450   Class Time Calculation (min) 50 min    [place] Name 02/01/22 1500     Education   Cardiac Education Topics Pritikin   Financial trader   Weekly Topic Simple Sides and Sauces   Instruction Review Code 1- Verbalizes Understanding   Class Start Time 1359   Class Stop Time 1440   Class Time Calculation (min) 41 min    [place] Name 02/08/22 1500     Education   Cardiac Education Topics Pritikin   Financial trader   Weekly Topic One-Pot Wonders   Instruction Review Code 1- Verbalizes Understanding   Class Start Time 8527   Class Stop Time 1440   Class Time Calculation (min) 53 min    [place] Name 02/10/22 1500     Education   Cardiac Education Topics [facility]   Select Workshops     Workshops   Educator Exercise  Physiologist   Select Psychosocial   Psychosocial Workshop New Thoughts, New Behaviors   Instruction Review Code 1- Verbalizes Understanding   Class Start Time 1355   Class Stop Time 1447   Class Time Calculation (min) 52 min    [place] Name 02/13/22 1500     Education   Cardiac  Education Topics Pritikin   Academic librarian Exercise Education   Exercise Education Biomechanial Limitations   Instruction Review Code 1- Verbalizes Understanding   Class Start Time 1350   Class Stop Time 1446   Class Time Calculation (min) 56 min            Core Videos: Exercise    Move It!  Clinical staff conducted group or individual video education with verbal and written material and guidebook.  Patient learns the recommended Pritikin exercise program. Exercise with the goal of living a long, healthy life. Some of the health benefits of exercise include controlled diabetes, healthier blood pressure levels, improved cholesterol levels, improved heart and lung capacity, improved sleep, and better body composition. Everyone should speak with their doctor before starting or changing an exercise routine.  Biomechanical Limitations Clinical staff conducted group or individual video education with verbal and written material and guidebook.  Patient learns how biomechanical limitations can impact exercise and how we can mitigate and possibly overcome limitations to have an impactful and balanced exercise routine.  Body Composition Clinical staff conducted group or individual video education with verbal and written material and guidebook.  Patient learns that body composition (ratio of muscle mass to fat mass) is a key component to assessing overall fitness, rather than body weight alone. Increased fat mass, especially visceral belly fat, can put [place] at increased risk for metabolic syndrome, type 2 diabetes, heart disease, and even death. It is  recommended to combine diet and exercise (cardiovascular and resistance training) to improve your body composition. Seek guidance from your physician and exercise physiologist before implementing an exercise routine.  Exercise Action Plan Clinical staff conducted group or individual video education with verbal and written material and guidebook.  Patient learns the recommended strategies to achieve and enjoy long-term exercise adherence, including variety, self-motivation, self-efficacy, and positive decision making. Benefits of exercise include fitness, good health, weight management, more energy, better sleep, less stress, and overall well-being.  Medical   Heart Disease Risk Reduction Clinical staff conducted group or individual video education with verbal and written material and guidebook.  Patient learns our heart is our most vital organ as it circulates oxygen, nutrients, white blood cells, and hormones throughout the entire body, and carries waste away. Data supports a plant-based eating plan like the Pritikin Program for its effectiveness in slowing progression of and reversing heart disease. The video provides a number of recommendations to address heart disease.   Metabolic Syndrome and Belly Fat  Clinical staff conducted group or individual video education with verbal and written material and guidebook.  Patient learns what metabolic syndrome is, how it leads to heart disease, and how one can reverse it and keep it from coming back. You have metabolic syndrome if you have 3 of the following 5 criteria: abdominal obesity, high blood pressure, high triglycerides, low HDL cholesterol, and high blood sugar.  Hypertension and Heart Disease Clinical staff conducted group or individual video education with verbal and written material and guidebook.  Patient learns that high blood pressure, or hypertension, is very common in the [place]. Hypertension is largely due to excessive salt  intake, but other important risk factors include being overweight, physical inactivity, drinking too much alcohol, smoking, and not eating enough potassium from fruits and vegetables. High blood pressure is a leading risk factor for heart attack, stroke, congestive heart failure, dementia, kidney failure, and premature  death. Long-term effects of excessive salt intake include stiffening of the arteries and thickening of heart muscle and organ damage. Recommendations include ways to reduce hypertension and the risk of heart disease.  Diseases of Our Time - Focusing on Diabetes Clinical staff conducted group or individual video education with verbal and written material and guidebook.  Patient learns why the best way to stop diseases of our time is prevention, through food and other lifestyle changes. Medicine (such as prescription pills and surgeries) is often only a Band-Aid on the problem, not a long-term solution. Most common diseases of our time include obesity, type 2 diabetes, hypertension, heart disease, and cancer. The Pritikin Program is recommended and has been proven to help reduce, reverse, and/or prevent the damaging effects of metabolic syndrome.  Nutrition   Overview of the Pritikin Eating Plan  Clinical staff conducted group or individual video education with verbal and written material and guidebook.  Patient learns about the [place] for disease risk reduction. The [place] emphasizes a wide variety of unrefined, minimally-processed carbohydrates, like fruits, vegetables, whole grains, and legumes. Go, Caution, and Stop food choices are explained. Plant-based and lean animal proteins are emphasized. Rationale provided for low sodium intake for blood pressure control, low added sugars for blood sugar stabilization, and low added fats and oils for coronary artery disease risk reduction and weight management.  Calorie Density  Clinical staff conducted group or  individual video education with verbal and written material and guidebook.  Patient learns about calorie density and how it impacts the Pritikin Eating Plan. Knowing the characteristics of the food you choose will help you decide whether those foods will lead to weight gain or weight loss, and whether you want to consume more or less of them. Weight loss is usually a side effect of the Pritikin Eating Plan because of its focus on low calorie-dense foods.  Label Reading  Clinical staff conducted group or individual video education with verbal and written material and guidebook.  Patient learns about the Pritikin recommended label reading guidelines and corresponding recommendations regarding calorie density, added sugars, sodium content, and whole grains.  Dining Out - Part 1  Clinical staff conducted group or individual video education with verbal and written material and guidebook.  Patient learns that restaurant meals can be sabotaging because they can be so high in calories, fat, sodium, and/or sugar. Patient learns recommended strategies on how to positively address this and avoid unhealthy pitfalls.  Facts on Fats  Clinical staff conducted group or individual video education with verbal and written material and guidebook.  Patient learns that lifestyle modifications can be just as effective, if not more so, as many medications for lowering your risk of heart disease. A Pritikin lifestyle can help to reduce your risk of inflammation and atherosclerosis (cholesterol build-up, or plaque, in the artery walls). Lifestyle interventions such as dietary choices and physical activity address the cause of atherosclerosis. A review of the types of fats and their impact on blood cholesterol levels, along with dietary recommendations to reduce fat intake is also included.  Nutrition Action Plan  Clinical staff conducted group or individual video education with verbal and written material and guidebook.   Patient learns how to incorporate Pritikin recommendations into their lifestyle. Recommendations include planning and keeping personal health goals in mind as an important part of their success.  Healthy Mind-Set    Healthy Minds, Bodies, Hearts  Clinical staff conducted group or individual video education with verbal and  written material and guidebook.  Patient learns how to identify when they are stressed. Video will discuss the impact of that stress, as well as the many benefits of stress management. Patient will also be introduced to stress management techniques. The way we think, act, and feel has an impact on our hearts.  How Our Thoughts Can Heal Our Hearts  Clinical staff conducted group or individual video education with verbal and written material and guidebook.  Patient learns that negative thoughts can cause depression and anxiety. This can result in negative lifestyle behavior and serious health problems. Cognitive behavioral therapy is an effective method to help control our thoughts in order to change and improve our emotional outlook.  Additional Videos:  Exercise    Improving Performance  Clinical staff conducted group or individual video education with verbal and written material and guidebook.  Patient learns to use a non-linear approach by alternating intensity levels and lengths of time spent exercising to help burn more calories and lose more body fat. Cardiovascular exercise helps improve heart health, metabolism, hormonal balance, blood sugar control, and recovery from fatigue. Resistance training improves strength, endurance, balance, coordination, reaction time, metabolism, and muscle mass. Flexibility exercise improves circulation, posture, and balance. Seek guidance from your physician and exercise physiologist before implementing an exercise routine and learn your capabilities and proper form for all exercise.  Introduction to Yoga  Clinical staff conducted group or  individual video education with verbal and written material and guidebook.  Patient learns about yoga, a discipline of the coming together of mind, breath, and body. The benefits of yoga include improved flexibility, improved range of motion, better posture and core strength, increased lung function, weight loss, and positive self-image. Yoga's heart health benefits include lowered blood pressure, healthier heart rate, decreased cholesterol and triglyceride levels, improved immune function, and reduced stress. Seek guidance from your physician and exercise physiologist before implementing an exercise routine and learn your capabilities and proper form for all exercise.  Medical   Aging: Enhancing Your Quality of Life  Clinical staff conducted group or individual video education with verbal and written material and guidebook.  Patient learns key strategies and recommendations to stay in good physical health and enhance quality of life, such as prevention strategies, having an advocate, securing a [place], and keeping a list of medications and system for tracking them. It also discusses how to avoid risk for bone loss.  Biology of Weight Control  Clinical staff conducted group or individual video education with verbal and written material and guidebook.  Patient learns that weight gain occurs because we consume more calories than we burn (eating more, moving less). Even if your body weight is normal, you may have higher ratios of fat compared to muscle mass. Too much body fat puts you at increased risk for cardiovascular disease, heart attack, stroke, type 2 diabetes, and obesity-related cancers. In addition to exercise, following the [place] can help reduce your risk.  Decoding Lab Results  Clinical staff conducted group or individual video education with verbal and written material and guidebook.  Patient learns that lab test reflects one measurement whose  values change over time and are influenced by many factors, including medication, stress, sleep, exercise, food, hydration, pre-existing medical conditions, and more. It is recommended to use the knowledge from this video to become more involved with your lab results and evaluate your numbers to speak with your doctor.   Diseases of Our Time -  Overview  Clinical staff conducted group or individual video education with verbal and written material and guidebook.  Patient learns that according to the CDC, 50% to 70% of chronic diseases (such as obesity, type 2 diabetes, elevated lipids, hypertension, and heart disease) are avoidable through lifestyle improvements including healthier food choices, listening to satiety cues, and increased physical activity.  Sleep Disorders Clinical staff conducted group or individual video education with verbal and written material and guidebook.  Patient learns how good quality and duration of sleep are important to overall health and well-being. Patient also learns about sleep disorders and how they impact health along with recommendations to address them, including discussing with a physician.  Nutrition  Dining Out - Part 2 Clinical staff conducted group or individual video education with verbal and written material and guidebook.  Patient learns how to plan ahead and communicate in order to maximize their dining experience in a healthy and nutritious manner. Included are recommended food choices based on the type of restaurant the patient is visiting.   Fueling a Best boy conducted group or individual video education with verbal and written material and guidebook.  There is a strong connection between our food choices and our health. Diseases like obesity and type 2 diabetes are very prevalent and are in large-part due to lifestyle choices. The Pritikin Eating Plan provides plenty of food and hunger-curbing satisfaction. It is easy to follow,  affordable, and helps reduce health risks.  Menu Workshop  Clinical staff conducted group or individual video education with verbal and written material and guidebook.  Patient learns that restaurant meals can sabotage health goals because they are often packed with calories, fat, sodium, and sugar. Recommendations include strategies to plan ahead and to communicate with the manager, chef, or server to help order a healthier meal.  Planning Your Eating Strategy  Clinical staff conducted group or individual video education with verbal and written material and guidebook.  Patient learns about the [place] and its benefit of reducing the risk of disease. The [place] does not focus on calories. Instead, it emphasizes high-quality, nutrient-rich foods. By knowing the characteristics of the foods, we choose, we can determine their calorie density and make informed decisions.  Targeting Your Nutrition Priorities  Clinical staff conducted group or individual video education with verbal and written material and guidebook.  Patient learns that lifestyle habits have a tremendous impact on disease risk and progression. This video provides eating and physical activity recommendations based on your personal health goals, such as reducing LDL cholesterol, losing weight, preventing or controlling type 2 diabetes, and reducing high blood pressure.  Vitamins and Minerals  Clinical staff conducted group or individual video education with verbal and written material and guidebook.  Patient learns different ways to obtain key vitamins and minerals, including through a recommended healthy diet. It is important to discuss all supplements you take with your doctor.   Healthy Mind-Set    Smoking Cessation  Clinical staff conducted group or individual video education with verbal and written material and guidebook.  Patient learns that cigarette smoking and tobacco addiction pose a serious health  risk which affects millions of people. Stopping smoking will significantly reduce the risk of heart disease, lung disease, and many forms of cancer. Recommended strategies for quitting are covered, including working with your doctor to develop a successful plan.  Culinary   Becoming a Financial trader conducted group or individual video education with verbal  and written material and guidebook.  Patient learns that cooking at home can be healthy, cost-effective, quick, and puts them in control. Keys to cooking healthy recipes will include looking at your recipe, assessing your equipment needs, planning ahead, making it simple, choosing cost-effective seasonal ingredients, and limiting the use of added fats, salts, and sugars.  Cooking - Breakfast and Snacks  Clinical staff conducted group or individual video education with verbal and written material and guidebook.  Patient learns how important breakfast is to satiety and nutrition through the entire day. Recommendations include key foods to eat during breakfast to help stabilize blood sugar levels and to prevent overeating at meals later in the day. Planning ahead is also a key component.  Cooking - Human resources officer conducted group or individual video education with verbal and written material and guidebook.  Patient learns eating strategies to improve overall health, including an approach to cook more at home. Recommendations include thinking of animal protein as a side on your plate rather than center stage and focusing instead on lower calorie dense options like vegetables, fruits, whole grains, and plant-based proteins, such as beans. Making sauces in large quantities to freeze for later and leaving the skin on your vegetables are also recommended to maximize your experience.  Cooking - Healthy Salads and Dressing Clinical staff conducted group or individual video education with verbal and written material and  guidebook.  Patient learns that vegetables, fruits, whole grains, and legumes are the foundations of the [place]. Recommendations include how to incorporate each of these in flavorful and healthy salads, and how to create homemade salad dressings. Proper handling of ingredients is also covered. Cooking - Soups and [employer] - Soups and Desserts Clinical staff conducted group or individual video education with verbal and written material and guidebook.  Patient learns that Pritikin soups and desserts make for easy, nutritious, and delicious snacks and meal components that are low in sodium, fat, sugar, and calorie density, while high in vitamins, minerals, and filling fiber. Recommendations include simple and healthy ideas for soups and desserts.   Overview     The Pritikin Solution Program Overview Clinical staff conducted group or individual video education with verbal and written material and guidebook.  Patient learns that the results of the [place] Program have been documented in more than 100 articles published in peer-reviewed journals, and the benefits include reducing risk factors for (and, in some cases, even reversing) high cholesterol, high blood pressure, type 2 diabetes, obesity, and more! An overview of the three key pillars of the Pritikin Program will be covered: eating well, doing regular exercise, and having a healthy mind-set.  WORKSHOPS  Exercise: Exercise Basics: Building Your Action Plan Clinical staff led group instruction and group discussion with PowerPoint presentation and patient guidebook. To enhance the learning environment the use of posters, models and videos may be added. At the conclusion of this workshop, patients will comprehend the difference between physical activity and exercise, as well as the benefits of incorporating both, into their routine. Patients will understand the FITT (Frequency, Intensity, Time, and Type) principle and how to  use it to build an exercise action plan. In addition, safety concerns and other considerations for exercise and cardiac rehab will be addressed by the presenter. The purpose of this lesson is to promote a comprehensive and effective weekly exercise routine in order to improve patients' overall level of fitness.   Managing Heart Disease: Your Path to a Healthier  Heart Clinical staff led group instruction and group discussion with PowerPoint presentation and patient guidebook. To enhance the learning environment the use of posters, models and videos may be added.At the conclusion of this workshop, patients will understand the anatomy and physiology of the heart. Additionally, they will understand how Pritikin's three pillars impact the risk factors, the progression, and the management of heart disease.  The purpose of this lesson is to provide a high-level overview of the heart, heart disease, and how the Pritikin lifestyle positively impacts risk factors.  Exercise Biomechanics Clinical staff led group instruction and group discussion with PowerPoint presentation and patient guidebook. To enhance the learning environment the use of posters, models and videos may be added. Patients will learn how the structural parts of their bodies function and how these functions impact their daily activities, movement, and exercise. Patients will learn how to promote a neutral spine, learn how to manage pain, and identify ways to improve their physical movement in order to promote healthy living. The purpose of this lesson is to expose patients to common physical limitations that impact physical activity. Participants will learn practical ways to adapt and manage aches and pains, and to minimize their effect on regular exercise. Patients will learn how to maintain good posture while sitting, walking, and lifting.  Balance Training and Fall Prevention  Clinical staff led group instruction and group discussion  with PowerPoint presentation and patient guidebook. To enhance the learning environment the use of posters, models and videos may be added. At the conclusion of this workshop, patients will understand the importance of their sensorimotor skills (vision, proprioception, and the vestibular system) in maintaining their ability to balance as they age. Patients will apply a variety of balancing exercises that are appropriate for their current level of function. Patients will understand the common causes for poor balance, possible solutions to these problems, and ways to modify their physical environment in order to minimize their fall risk. The purpose of this lesson is to teach patients about the importance of maintaining balance as they age and ways to minimize their risk of falling.  WORKSHOPS   Nutrition:  Fueling a Scientist, research (physical sciences) led group instruction and group discussion with PowerPoint presentation and patient guidebook. To enhance the learning environment the use of posters, models and videos may be added. Patients will review the foundational principles of the [place] and understand what constitutes a serving size in each of the food groups. Patients will also learn Pritikin-friendly foods that are better choices when away from home and review make-ahead meal and snack options. Calorie density will be reviewed and applied to three nutrition priorities: weight maintenance, weight loss, and weight gain. The purpose of this lesson is to reinforce (in a group setting) the key concepts around what patients are recommended to eat and how to apply these guidelines when away from home by planning and selecting Pritikin-friendly options. Patients will understand how calorie density may be adjusted for different weight management goals.  Mindful Eating  Clinical staff led group instruction and group discussion with PowerPoint presentation and patient guidebook. To enhance the  learning environment the use of posters, models and videos may be added. Patients will briefly review the concepts of the [place] and the importance of low-calorie dense foods. The concept of mindful eating will be introduced as well as the importance of paying attention to internal hunger signals. Triggers for non-hunger eating and techniques for dealing with triggers will be explored.  The purpose of this lesson is to provide patients with the opportunity to review the basic principles of the [place], discuss the value of eating mindfully and how to measure internal cues of hunger and fullness using the Hunger Scale. Patients will also discuss reasons for non-hunger eating and learn strategies to use for controlling emotional eating.  Targeting Your Nutrition Priorities Clinical staff led group instruction and group discussion with PowerPoint presentation and patient guidebook. To enhance the learning environment the use of posters, models and videos may be added. Patients will learn how to determine their genetic susceptibility to disease by reviewing their family history. Patients will gain insight into the importance of diet as part of an overall healthy lifestyle in mitigating the impact of genetics and other environmental insults. The purpose of this lesson is to provide patients with the opportunity to assess their personal nutrition priorities by looking at their family history, their own health history and current risk factors. Patients will also be able to discuss ways of prioritizing and modifying the [place] for their highest risk areas  Menu  Clinical staff led group instruction and group discussion with PowerPoint presentation and patient guidebook. To enhance the learning environment the use of posters, models and videos may be added. Using menus brought in from [employer], or printed from [employer], patients will apply the [place] dining out  guidelines that were presented in the [employer] video. Patients will also be able to practice these guidelines in a variety of provided scenarios. The purpose of this lesson is to provide patients with the opportunity to practice hands-on learning of the [place] with actual menus and practice scenarios.  Label Reading Clinical staff led group instruction and group discussion with PowerPoint presentation and patient guidebook. To enhance the learning environment the use of posters, models and videos may be added. Patients will review and discuss the Pritikin label reading guidelines presented in Pritikin's Label Reading Educational series video. Using fool labels brought in from local grocery stores and markets, patients will apply the label reading guidelines and determine if the packaged food meet the Pritikin guidelines. The purpose of this lesson is to provide patients with the opportunity to review, discuss, and practice hands-on learning of the Pritikin Label Reading guidelines with actual packaged food labels. [place] Workshops are designed to teach patients ways to prepare quick, simple, and affordable recipes at home. The importance of nutrition's role in chronic disease risk reduction is reflected in its emphasis in the overall Pritikin program. By learning how to prepare essential core Pritikin Eating Plan recipes, patients will increase control over what they eat; be able to customize the flavor of foods without the use of added salt, sugar, or fat; and improve the quality of the food they consume. By learning a set of core recipes which are easily assembled, quickly prepared, and affordable, patients are more likely to prepare more healthy foods at home. These workshops focus on convenient breakfasts, simple entres, side dishes, and desserts which can be prepared with minimal effort and are consistent with nutrition  recommendations for cardiovascular risk reduction. Cooking [employer] are taught by a Engineer, materials (RD) who has been trained by the [employer]. The chef or RD has a clear understanding of the importance of minimizing - if not completely eliminating - added fat, sugar, and sodium in recipes. Throughout the series of [employer]  sessions, patients will learn about healthy ingredients and efficient methods of cooking to build confidence in their capability to prepare    Cooking School weekly topics:  Adding Flavor- Sodium-Free  Fast and Healthy Breakfasts  Powerhouse Plant-Based Proteins  Satisfying Salads and Dressings  Simple Sides and Sauces  International Cuisine-Spotlight on the Blue Z[place]  Delicious Desserts  Savory Soups  Efficiency Cooking - Meals in a Snap  Tasty Appetizers and Snacks  Comforting Weekend Breakfasts  One-Pot Wonders   Fast Evening Meals  Contractor Your Pritikin Plate  WORKSHOPS   Healthy Mindset (Psychosocial): New Thoughts, New Behaviors Clinical staff led group instruction and group discussion with PowerPoint presentation and patient guidebook. To enhance the learning environment the use of posters, models and videos may be added. Patients will learn and practice techniques for developing effective health and lifestyle goals. Patients will be able to effectively apply the goal setting process learned to develop at least one new personal goal.  The purpose of this lesson is to expose patients to a new skill set of behavior modification techniques such as techniques setting SMART goals, overcoming barriers, and achieving new thoughts and new behaviors.  Managing Moods and Relationships Clinical staff led group instruction and group discussion with PowerPoint presentation and patient guidebook. To enhance the learning environment the use of posters, models and videos may be added. Patients will  learn how emotional and chronic stress factors can impact their health and relationships. They will learn healthy ways to manage their moods and utilize positive coping mechanisms. In addition, ICR patients will learn ways to improve communication skills. The purpose of this lesson is to expose patients to ways of understanding how one's mood and health are intimately connected. Developing a healthy outlook can help build positive relationships and connections with others. Patients will understand the importance of utilizing effective communication skills that include actively listening and being heard. They will learn and understand the importance of the "4 Cs" and especially Connections in fostering of a Healthy Mind-Set.  Healthy Sleep for a Healthy Heart Clinical staff led group instruction and group discussion with PowerPoint presentation and patient guidebook. To enhance the learning environment the use of posters, models and videos may be added. At the conclusion of this workshop, patients will be able to demonstrate knowledge of the importance of sleep to overall health, well-being, and quality of life. They will understand the symptoms of, and treatments for, common sleep disorders. Patients will also be able to identify daytime and nighttime behaviors which impact sleep, and they will be able to apply these tools to help manage sleep-related challenges. The purpose of this lesson is to provide patients with a general overview of sleep and outline the importance of quality sleep. Patients will learn about a few of the most common sleep disorders. Patients will also be introduced to the concept of "sleep hygiene," and discover ways to self-manage certain sleeping problems through simple daily behavior changes. Finally, the workshop will motivate patients by clarifying the links between quality sleep and their goals of heart-healthy living.   Recognizing and Reducing Stress Clinical staff led group  instruction and group discussion with PowerPoint presentation and patient guidebook. To enhance the learning environment the use of posters, models and videos may be added. At the conclusion of this workshop, patients will be able to understand the types of stress reactions, differentiate between acute and chronic stress, and recognize the impact that chronic stress has on their health. They will  also be able to apply different coping mechanisms, such as reframing negative self-talk. Patients will have the opportunity to practice a variety of stress management techniques, such as deep abdominal breathing, progressive muscle relaxation, and/or guided imagery.  The purpose of this lesson is to educate patients on the role of stress in their lives and to provide healthy techniques for coping with it.  Learning Barriers/Preferences:  Learning Barriers/Preferences - 01/03/22 1116       Learning Barriers/Preferences   Learning Barriers Sight   wears glasses   Learning Preferences Audio;Skilled Demonstration;Computer/Internet;Verbal Instruction;Group Instruction;Video;Individual Instruction;Written Material;Pictoral             Education Topics:  Knowledge Questionnaire Score:  Knowledge Questionnaire Score - 01/03/22 1116       Knowledge Questionnaire Score   Pre Score 23/28             Core Components/Risk Factors/Patient Goals at Admission:  Personal Goals and Risk Factors at Admission - 01/03/22 1125       Core Components/Risk Factors/Patient Goals on Admission    Weight Management Yes;Obesity;Weight Loss    Intervention Weight Management: Develop a combined nutrition and exercise program designed to reach desired caloric intake, while maintaining appropriate intake of nutrient and fiber, sodium and fats, and appropriate energy expenditure required for the weight goal.;Weight Management: Provide education and appropriate resources to help participant work on and attain dietary  goals.;Weight Management/Obesity: Establish reasonable short term and long term weight goals.;Obesity: Provide education and appropriate resources to help participant work on and attain dietary goals.    Admit Weight 238 lb 15.7 oz (108.4 kg)    Expected Outcomes Long Term: Adherence to nutrition and physical activity/exercise program aimed toward attainment of established weight goal;Short Term: Continue to assess and modify interventions until short term weight is achieved;Weight Loss: Understanding of general recommendations for a balanced deficit meal plan, which promotes 1-2 lb weight loss per week and includes a negative energy balance of [phone] kcal/d;Understanding of distribution of calorie intake throughout the day with the consumption of 4-5 meals/snacks;Understanding recommendations for meals to include 15-35% energy as protein, 25-35% energy from fat, 35-60% energy from carbohydrates, less than 233m of dietary cholesterol, 20-35 gm of total fiber daily    Tobacco Cessation Yes    Number of packs per day .5    Intervention Assist the participant in steps to quit. Provide individualized education and counseling about committing to Tobacco Cessation, relapse prevention, and pharmacological support that can be provided by physician.;OAdvice worker assist with locating and accessing local/national Quit Smoking programs, and support quit date choice.    Expected Outcomes Short Term: Will demonstrate readiness to quit, by selecting a quit date.;Short Term: Will quit all tobacco product use, adhering to prevention of relapse plan.;Long Term: Complete abstinence from all tobacco products for at least 12 months from quit date.    Heart Failure Yes    Intervention Provide a combined exercise and nutrition program that is supplemented with education, support and counseling about heart failure. Directed toward relieving symptoms such as shortness of breath, decreased exercise tolerance, and  extremity edema.    Expected Outcomes Long term: Adoption of self-care skills and reduction of barriers for early signs and symptoms recognition and intervention leading to self-care maintenance.;Short term: Daily weights obtained and reported for increase. Utilizing diuretic protocols set by physician.;Short term: Attendance in program 2-3 days a week with increased exercise capacity. Reported lower sodium intake. Reported increased fruit and vegetable intake. Reports medication compliance.;Improve  functional capacity of life    Hypertension Yes    Intervention Provide education on lifestyle modifcations including regular physical activity/exercise, weight management, moderate sodium restriction and increased consumption of fresh fruit, vegetables, and low fat dairy, alcohol moderation, and smoking cessation.;Monitor prescription use compliance.    Expected Outcomes Short Term: Continued assessment and intervention until BP is < 140/69m HG in hypertensive participants. < 130/852mHG in hypertensive participants with diabetes, heart failure or chronic kidney disease.;Long Term: Maintenance of blood pressure at goal levels.    Lipids Yes    Intervention Provide education and support for participant on nutrition & aerobic/resistive exercise along with prescribed medications to achieve LDL <7045mHDL >20m16m  Expected Outcomes Long Term: Cholesterol controlled with medications as prescribed, with individualized exercise RX and with personalized nutrition plan. Value goals: LDL < 70mg16mL > 40 mg.;Short Term: Participant states understanding of desired cholesterol values and is compliant with medications prescribed. Participant is following exercise prescription and nutrition guidelines.    Stress Yes    Intervention Offer individual and/or small group education and counseling on adjustment to heart disease, stress management and health-related lifestyle change. Teach and support self-help strategies.;Refer  participants experiencing significant psychosocial distress to appropriate mental health specialists for further evaluation and treatment. When possible, include family members and significant others in education/counseling sessions.    Expected Outcomes Short Term: Participant demonstrates changes in health-related behavior, relaxation and other stress management skills, ability to obtain effective social support, and compliance with psychotropic medications if prescribed.;Long Term: Emotional wellbeing is indicated by absence of clinically significant psychosocial distress or social isolation.             Core Components/Risk Factors/Patient Goals Review:   Goals and Risk Factor Review     Row Name 01/25/22 1154 02/15/22 0937           Core Components/Risk Factors/Patient Goals Review   Personal Goals Review Weight Management/Obesity;Heart Failure;Stress;Hypertension;Lipids;Tobacco Cessation Weight Management/Obesity;Heart Failure;Stress;Hypertension;Lipids;Tobacco Cessation      Review Chrishas been doing well with exercise at a low level due to continued BP elevations and deconditoning. Dr McleaAundra Dubinbeen notifed about BP elelvations. BP's have been variable.ChrisGerald Stabsinue to smoke cigarettes and marajuana. Will continue to montior BP. ChrisGerald Stabsnjoying participating in cardiac rehab. ChrisGerald Stabsbeen able to increase his work loads. Blood pressures has been improved.ChrisGerald Stabsinue to smoke cigarettes and marajuana.  ChrisGerald Stabstill  enjoying participating in cardiac rehab. ChrisGerald Stabs compete cardiac rehab a the end of the month.      Expected Outcomes ChrisGerald Stabs continue to participate in traditional/ intensvie cardiac rehab for exercise, nutrition and lifestyle modifications ChrisGerald Stabs continue to participate in traditional/ intensvie cardiac rehab for exercise, nutrition and lifestyle modifications               Core Components/Risk Factors/Patient Goals at Discharge (Final Review):    Goals and Risk Factor Review - 02/15/22 0937       Core Components/Risk Factors/Patient Goals Review   Personal Goals Review Weight Management/Obesity;Heart Failure;Stress;Hypertension;Lipids;Tobacco Cessation    Review ChrisGerald Stabsbeen able to increase his work loads. Blood pressures has been improved.ChrisGerald Stabsinue to smoke cigarettes and marajuana.  ChrisGerald Stabstill  enjoying participating in cardiac rehab. ChrisGerald Stabs compete cardiac rehab a the end of the month.    Expected Outcomes ChrisGerald Stabs continue to participate in traditional/ intensvie cardiac rehab for exercise, nutrition and lifestyle modifications  ITP Comments:  ITP Comments     Row Name 01/03/22 0843 01/25/22 1115 02/15/22 0848       ITP Comments Dr Fransico Him MD, Medical Director, Introduction to [facility] Program/ Intensive Cardiac Rehab. Initial Orientation Packet Reviewed with the patient 30 Day ITP Review. [name] has good attendance and participation in cardiac rehab 30 Day ITP Review. [name] contibues to have  good attendance and participation in cardiac rehab. [name] will complete cardiac rehab at the end of the month              Comments: See ITP comments.Harrell Gave RN BSN

## 2022-02-16 ENCOUNTER — Telehealth: Payer: Self-pay | Admitting: Internal Medicine

## 2022-02-16 NOTE — Telephone Encounter (Signed)
PA for repatha pushtronex submitted via CMM (Key: [id number])

## 2022-02-17 ENCOUNTER — Encounter (HOSPITAL_COMMUNITY): Payer: Medicaid Other

## 2022-02-17 ENCOUNTER — Other Ambulatory Visit (HOSPITAL_COMMUNITY): Payer: Self-pay

## 2022-02-17 MED ORDER — REPATHA PUSHTRONEX SYSTEM 420 MG/3.5ML ~~LOC~~ SOCT
1.0000 | SUBCUTANEOUS | 3 refills | Status: DC
  Filled 2022-02-17: qty 3.5, 30d supply, fill #0

## 2022-02-17 NOTE — Addendum Note (Signed)
Addended by: Fidel Levy on: 02/17/2022 01:14 PM   Modules accepted: Orders

## 2022-02-17 NOTE — Telephone Encounter (Signed)
Request Reference Number: [id number]. REPATHA PUSH INJ 420/3.5 is approved through 02/17/2023. For further questions, call [employer] at [phone].

## 2022-02-17 NOTE — Telephone Encounter (Signed)
LM for patient that med approved Rx(s) sent to pharmacy electronically.

## 2022-02-20 ENCOUNTER — Encounter (HOSPITAL_COMMUNITY): Payer: Medicaid Other

## 2022-02-21 ENCOUNTER — Other Ambulatory Visit (HOSPITAL_COMMUNITY): Payer: Self-pay

## 2022-02-22 ENCOUNTER — Encounter (HOSPITAL_COMMUNITY)
Admission: RE | Admit: 2022-02-22 | Discharge: 2022-02-22 | Disposition: A | Discharge status: Home / Self Care | Payer: Medicaid Other | Source: Ambulatory Visit | Attending: Cardiology | Admitting: Cardiology

## 2022-02-22 DIAGNOSIS — I5022 Chronic systolic (congestive) heart failure: Secondary | ICD-10-CM

## 2022-02-22 NOTE — Progress Notes (Signed)
Incomplete Session Note  Patient Details  Name: Scott Salinas MRN: [id number] Date of Birth: 09-07-1970 Referring Provider:   Flowsheet Row CARDIAC REHAB PHASE II ORIENTATION from 01/03/2022 in [facility]  Referring Provider Loralie Champagne, MD       [name] did not complete his rehab session. [name] has another appointment and will not be able to exercise today. [name] plans to return to exercise on Friday.Harrell Gave RN BSN

## 2022-02-23 DIAGNOSIS — N1832 Chronic kidney disease, stage 3b: Secondary | ICD-10-CM | POA: Diagnosis not present

## 2022-02-23 DIAGNOSIS — N2581 Secondary hyperparathyroidism of renal origin: Secondary | ICD-10-CM | POA: Diagnosis not present

## 2022-02-23 DIAGNOSIS — N189 Chronic kidney disease, unspecified: Secondary | ICD-10-CM | POA: Diagnosis not present

## 2022-02-23 DIAGNOSIS — D631 Anemia in chronic kidney disease: Secondary | ICD-10-CM | POA: Diagnosis not present

## 2022-02-23 DIAGNOSIS — I130 Hypertensive heart and chronic kidney disease with heart failure and stage 1 through stage 4 chronic kidney disease, or unspecified chronic kidney disease: Secondary | ICD-10-CM | POA: Diagnosis not present

## 2022-02-24 ENCOUNTER — Other Ambulatory Visit: Payer: Self-pay | Admitting: Nephrology

## 2022-02-24 ENCOUNTER — Encounter (HOSPITAL_COMMUNITY)
Admission: RE | Admit: 2022-02-24 | Discharge: 2022-02-24 | Disposition: A | Discharge status: Home / Self Care | Payer: Medicaid Other | Source: Ambulatory Visit | Attending: Cardiology | Admitting: Cardiology

## 2022-02-24 DIAGNOSIS — I5022 Chronic systolic (congestive) heart failure: Secondary | ICD-10-CM | POA: Diagnosis not present

## 2022-02-24 DIAGNOSIS — N1832 Chronic kidney disease, stage 3b: Secondary | ICD-10-CM

## 2022-02-24 DIAGNOSIS — I129 Hypertensive chronic kidney disease with stage 1 through stage 4 chronic kidney disease, or unspecified chronic kidney disease: Secondary | ICD-10-CM

## 2022-02-24 DIAGNOSIS — I509 Heart failure, unspecified: Secondary | ICD-10-CM

## 2022-02-24 DIAGNOSIS — D631 Anemia in chronic kidney disease: Secondary | ICD-10-CM

## 2022-02-24 DIAGNOSIS — N2581 Secondary hyperparathyroidism of renal origin: Secondary | ICD-10-CM

## 2022-02-27 ENCOUNTER — Ambulatory Visit (INDEPENDENT_AMBULATORY_CARE_PROVIDER_SITE_OTHER): Payer: Medicaid Other

## 2022-02-27 ENCOUNTER — Encounter: Payer: Self-pay | Admitting: Orthopedic Surgery

## 2022-02-27 ENCOUNTER — Ambulatory Visit (INDEPENDENT_AMBULATORY_CARE_PROVIDER_SITE_OTHER): Payer: Medicaid Other | Admitting: Orthopedic Surgery

## 2022-02-27 ENCOUNTER — Encounter (HOSPITAL_COMMUNITY)
Admission: RE | Admit: 2022-02-27 | Discharge: 2022-02-27 | Disposition: A | Discharge status: Home / Self Care | Payer: Medicaid Other | Source: Ambulatory Visit | Attending: Cardiology | Admitting: Cardiology

## 2022-02-27 VITALS — Ht 69.0 in | Wt 241.0 lb

## 2022-02-27 DIAGNOSIS — M1712 Unilateral primary osteoarthritis, left knee: Secondary | ICD-10-CM | POA: Diagnosis not present

## 2022-02-27 DIAGNOSIS — M25551 Pain in right hip: Secondary | ICD-10-CM

## 2022-02-27 DIAGNOSIS — I5022 Chronic systolic (congestive) heart failure: Secondary | ICD-10-CM

## 2022-02-28 ENCOUNTER — Encounter: Payer: Self-pay | Admitting: Internal Medicine

## 2022-02-28 ENCOUNTER — Ambulatory Visit
Admission: RE | Admit: 2022-02-28 | Discharge: 2022-02-28 | Disposition: A | Discharge status: Home / Self Care | Payer: Medicaid Other | Source: Ambulatory Visit | Attending: Nephrology | Admitting: Nephrology

## 2022-02-28 ENCOUNTER — Other Ambulatory Visit: Payer: Self-pay | Admitting: *Deleted

## 2022-02-28 DIAGNOSIS — D631 Anemia in chronic kidney disease: Secondary | ICD-10-CM

## 2022-02-28 DIAGNOSIS — I129 Hypertensive chronic kidney disease with stage 1 through stage 4 chronic kidney disease, or unspecified chronic kidney disease: Secondary | ICD-10-CM

## 2022-02-28 DIAGNOSIS — E7800 Pure hypercholesterolemia, unspecified: Secondary | ICD-10-CM

## 2022-02-28 DIAGNOSIS — E7841 Elevated Lipoprotein(a): Secondary | ICD-10-CM

## 2022-02-28 DIAGNOSIS — N1832 Chronic kidney disease, stage 3b: Secondary | ICD-10-CM

## 2022-02-28 DIAGNOSIS — N189 Chronic kidney disease, unspecified: Secondary | ICD-10-CM | POA: Diagnosis not present

## 2022-02-28 DIAGNOSIS — I509 Heart failure, unspecified: Secondary | ICD-10-CM

## 2022-02-28 DIAGNOSIS — N2581 Secondary hyperparathyroidism of renal origin: Secondary | ICD-10-CM

## 2022-03-01 ENCOUNTER — Encounter (HOSPITAL_COMMUNITY): Payer: Medicaid Other

## 2022-03-02 ENCOUNTER — Encounter: Payer: Self-pay | Admitting: Orthopedic Surgery

## 2022-03-02 MED ORDER — BUPIVACAINE HCL 0.25 % IJ SOLN
4.0000 mL | INTRAMUSCULAR | Status: AC | PRN
  Administered 2022-02-27: 4 mL via INTRA_ARTICULAR

## 2022-03-02 MED ORDER — LIDOCAINE HCL 1 % IJ SOLN
5.0000 mL | INTRAMUSCULAR | Status: AC | PRN
  Administered 2022-02-27: 5 mL
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
## 2022-03-03 ENCOUNTER — Telehealth (HOSPITAL_COMMUNITY): Payer: Self-pay | Admitting: Emergency Medicine

## 2022-03-03 ENCOUNTER — Encounter (HOSPITAL_COMMUNITY): Payer: Medicaid Other

## 2022-03-03 NOTE — Telephone Encounter (Signed)
Attempted to call patient regarding upcoming cardiac MR appointment. Left message on voicemail with name and callback number Jakelyn Squyres RN Navigator Cardiac Imaging [facility] [phone] Office [phone] Cell  

## 2022-03-06 ENCOUNTER — Other Ambulatory Visit (HOSPITAL_COMMUNITY): Payer: Self-pay

## 2022-03-06 ENCOUNTER — Other Ambulatory Visit (HOSPITAL_COMMUNITY): Payer: Self-pay | Admitting: *Deleted

## 2022-03-06 ENCOUNTER — Telehealth (HOSPITAL_COMMUNITY): Payer: Self-pay | Admitting: Emergency Medicine

## 2022-03-06 ENCOUNTER — Encounter (HOSPITAL_COMMUNITY): Payer: Self-pay

## 2022-03-06 ENCOUNTER — Other Ambulatory Visit: Payer: Self-pay | Admitting: Pharmacist Clinician (PhC)/ Clinical Pharmacy Specialist

## 2022-03-06 ENCOUNTER — Ambulatory Visit (HOSPITAL_COMMUNITY)
Admission: RE | Admit: 2022-03-06 | Discharge: 2022-03-06 | Disposition: A | Discharge status: Home / Self Care | Payer: Medicaid Other | Source: Ambulatory Visit | Attending: Internal Medicine | Admitting: Internal Medicine

## 2022-03-06 ENCOUNTER — Telehealth (HOSPITAL_COMMUNITY): Payer: Self-pay | Admitting: Pharmacy Technician

## 2022-03-06 DIAGNOSIS — I5042 Chronic combined systolic (congestive) and diastolic (congestive) heart failure: Secondary | ICD-10-CM

## 2022-03-06 DIAGNOSIS — I422 Other hypertrophic cardiomyopathy: Secondary | ICD-10-CM

## 2022-03-06 MED ORDER — TORSEMIDE 20 MG PO TABS
80.0000 mg | ORAL_TABLET | Freq: Two times a day (BID) | ORAL | 3 refills | Status: DC
  Filled 2022-03-07: qty 696, 87d supply, fill #0
  Filled 2022-03-07 (×2): qty 24, 3d supply, fill #0

## 2022-03-06 MED ORDER — AMLODIPINE BESYLATE 10 MG PO TABS
10.0000 mg | ORAL_TABLET | Freq: Every day | ORAL | 3 refills | Status: DC
  Filled 2022-03-06: qty 90, 90d supply, fill #0
  Filled 2022-04-07: qty 90, 90d supply, fill #1

## 2022-03-06 MED ORDER — SPIRONOLACTONE 25 MG PO TABS
12.5000 mg | ORAL_TABLET | Freq: Every day | ORAL | 3 refills | Status: DC
  Filled 2022-03-06: qty 45, 90d supply, fill #0
  Filled 2022-04-07: qty 45, 90d supply, fill #1

## 2022-03-06 MED ORDER — REPATHA PUSHTRONEX SYSTEM 420 MG/3.5ML ~~LOC~~ SOCT: 1.0000 | SUBCUTANEOUS | 0 refills | Status: DC

## 2022-03-06 NOTE — Telephone Encounter (Signed)
Pt calling reporting hes at the pharm to pick up torsemide, ins is only covering 4 tabs a day but hes prescribed 8 tabs which will run him $70 and he cannot afford this.   They requested he get a 6 month prior authorization thru [employer].   Pt also states hes concerned that he wasn't able to complete CMR today d/t claustro. Wants to know if he can still get pacer without additional imaging.   Marchia Bond RN Navigator Cardiac Imaging [facility] [phone] Office  [phone] Cell

## 2022-03-06 NOTE — Telephone Encounter (Addendum)
Patient Advocate Encounter   Received notification from [facility] EPA that a quantity limit exception for Torsemide (80 mg bid) is required.   PA submitted on CoverMyMeds Key  [id number] Status is pending   Will continue to follow.  Called and spoke with the patient this morning. He was aware that this request was being worked on. Called and spoke with [facility] outpatient pharmacy. Requested an emergency supply be given to the patient while we await a determination from insurance. Spoke to Manuela Schwartz [facility] who stated they would get that ready. Called and update patient.

## 2022-03-06 NOTE — Telephone Encounter (Signed)
error 

## 2022-03-07 ENCOUNTER — Other Ambulatory Visit (HOSPITAL_COMMUNITY): Payer: Self-pay

## 2022-03-07 NOTE — Telephone Encounter (Signed)
Advanced Heart Failure Patient Advocate Encounter  Prior Authorization for Torsemide (80 mg bid) has been approved.    PA# [id number] Effective dates: 03/06/22 through 03/07/23  Called and update pharmacy and patient.   Charlann Boxer, CPhT

## 2022-03-07 NOTE — Telephone Encounter (Signed)
Per 10/30 encounter a PA has been submitted for Torsemide.

## 2022-03-08 ENCOUNTER — Encounter (HOSPITAL_COMMUNITY)
Admission: RE | Admit: 2022-03-08 | Discharge: 2022-03-08 | Disposition: A | Discharge status: Home / Self Care | Payer: Medicaid Other | Source: Ambulatory Visit | Attending: Cardiology | Admitting: Cardiology

## 2022-03-08 ENCOUNTER — Other Ambulatory Visit (HOSPITAL_COMMUNITY): Payer: Self-pay

## 2022-03-08 VITALS — Ht 69.0 in | Wt 239.0 lb

## 2022-03-08 DIAGNOSIS — I5022 Chronic systolic (congestive) heart failure: Secondary | ICD-10-CM | POA: Insufficient documentation

## 2022-03-15 NOTE — Progress Notes (Signed)
Discharge Progress Report  Patient Details  Name: Scott Salinas MRN: [id number] Date of Birth: [birth year] Referring Provider:   Flowsheet Row CARDIAC REHAB PHASE II ORIENTATION from 01/03/2022 in [facility]  Referring Provider Scott Champagne, MD        Number of Visits: 15 exercise  Reason for Discharge:  Patient reached a stable level of exercise. Patient independent in their exercise. Patient has met program and personal goals.  Smoking History:  Social History   Tobacco Use  Smoking Status Every Day   Packs/day: 0.50   Years: 20.00   Total pack years: 10.00   Types: Cigarettes  Smokeless Tobacco Never  Tobacco Comments   Referral faxed to the [place] quit line. [name] says he is interested in quitting smoking. Chantx and the patch has not worked to him.    Diagnosis:  Heart failure, chronic systolic (HCC)  ADL UCSD:   Initial Exercise Prescription:  Initial Exercise Prescription - 01/03/22 1100       Date of Initial Exercise RX and Referring Provider   Date 01/03/22    Referring Provider Scott Champagne, MD    Expected Discharge Date 03/03/22      NuStep   Level 2    SPM 75    Minutes 15    METs 3.7      Arm Ergometer   Level 2.5    Watts 50    RPM 60    Minutes 15    METs 3.7      Prescription Details   Frequency (times per week) 3    Duration Progress to 30 minutes of continuous aerobic without signs/symptoms of physical distress      Intensity   THRR 40-80% of Max Heartrate 68-135    Ratings of Perceived Exertion 11-13    Perceived Dyspnea 0-4      Progression   Progression Continue progressive overload as per policy without signs/symptoms or physical distress.      Resistance Training   Training Prescription Yes    Weight 6 lbs    Reps 10-15             Discharge Exercise Prescription (Final Exercise Prescription Changes):  Exercise Prescription Changes - 03/08/22 1600        Response to Exercise   Blood Pressure (Admit) 124/92    Blood Pressure (Exercise) 150/92    Blood Pressure (Exit) 104/86    Heart Rate (Admit) 86 bpm    Heart Rate (Exercise) 109 bpm    Heart Rate (Exit) 85 bpm    Rating of Perceived Exertion (Exercise) 13.3    Perceived Dyspnea (Exercise) 0    Symptoms Back pain 5/10 and left knee pain 8/10    Comments Pt graduated the CRP2    Duration Progress to 30 minutes of  aerobic without signs/symptoms of physical distress    Intensity THRR unchanged      Progression   Progression Continue to progress workloads to maintain intensity without signs/symptoms of physical distress.    Average METs 2.8      Resistance Training   Training Prescription No      NuStep   Level 3    SPM 85    Minutes 15    METs 2.5      Arm Ergometer   Level 3    Minutes 6    METs --   pt RPE was 17 discontinued exercise     Track  Laps 12   [id number]   Minutes 6    METs 3.98      Home Exercise Plan   [facility]    Frequency Add 2 additional days to program exercise sessions.    Initial Home Exercises Provided 01/25/22             Functional Capacity:  6 Minute Walk     Row Name 01/03/22 1106 03/08/22 1628       6 Minute Walk   Phase Initial Discharge    Distance 1366 feet 1440 feet    Distance % Change -- 5.42 %    Distance Feet Change -- 74 ft    Walk Time 6 minutes 6 minutes    # of Rest Breaks 0 0    MPH 2.59 3.98    METS 3.74 2.73    RPE 7 12    Perceived Dyspnea  1 1    VO2 Peak 13.1 13.92    Symptoms Yes (comment) Yes (comment)    Comments Left calf and knee pain, 7/10; bottom of right foot pian 5/10; right anke pain 8/10, SOB RPD = 1 back pain 5/10 and left knee pain 8/10    Resting HR 70 bpm 86 bpm    Resting BP 122/92 124/92    Resting Oxygen Saturation  99 % --    Exercise Oxygen Saturation  during 6 min walk 99 % --    Max Ex. HR 102 bpm 109 bpm    Max Ex. BP 146/94  150/92    2 Minute Post BP 122/86 --             Psychological, QOL, Others - Outcomes: PHQ 2/9:    03/08/2022    4:10 PM 01/03/2022    9:09 AM  Depression screen PHQ 2/9  Decreased Interest 0 1  Down, Depressed, Hopeless 0 0  PHQ - 2 Score 0 1    Quality of Life:  Quality of Life - 03/08/22 1417       Quality of Life   Select Quality of Life      Quality of Life Scores   Health/Function Post 23.38 %    Socioeconomic Post 11.21 %    Psych/Spiritual Post 18.21 %    Family Post 19.6 %    GLOBAL Post 18.85 %             Personal Goals: Goals established at orientation with interventions provided to work toward goal.  Personal Goals and Risk Factors at Admission - 01/03/22 1125       Core Components/Risk Factors/Patient Goals on Admission    Weight Management Yes;Obesity;Weight Loss    Intervention Weight Management: Develop a combined nutrition and exercise program designed to reach desired caloric intake, while maintaining appropriate intake of nutrient and fiber, sodium and fats, and appropriate energy expenditure required for the weight goal.;Weight Management: Provide education and appropriate resources to help participant work on and attain dietary goals.;Weight Management/Obesity: Establish reasonable short term and long term weight goals.;Obesity: Provide education and appropriate resources to help participant work on and attain dietary goals.    Admit Weight 238 lb 15.7 oz (108.4 kg)    Expected Outcomes Long Term: Adherence to nutrition and physical activity/exercise program aimed toward attainment of established weight goal;Short Term: Continue to assess and modify interventions until short term weight is achieved;Weight Loss: Understanding of general recommendations for a balanced deficit meal plan, which promotes 1-2 lb  weight loss per week and includes a negative energy balance of [phone] kcal/d;Understanding of distribution of calorie intake throughout the  day with the consumption of 4-5 meals/snacks;Understanding recommendations for meals to include 15-35% energy as protein, 25-35% energy from fat, 35-60% energy from carbohydrates, less than 253m of dietary cholesterol, 20-35 gm of total fiber daily    Tobacco Cessation Yes    Number of packs per day .5    Intervention Assist the participant in steps to quit. Provide individualized education and counseling about committing to Tobacco Cessation, relapse prevention, and pharmacological support that can be provided by physician.;OAdvice worker assist with locating and accessing local/national Quit Smoking programs, and support quit date choice.    Expected Outcomes Short Term: Will demonstrate readiness to quit, by selecting a quit date.;Short Term: Will quit all tobacco product use, adhering to prevention of relapse plan.;Long Term: Complete abstinence from all tobacco products for at least 12 months from quit date.    Heart Failure Yes    Intervention Provide a combined exercise and nutrition program that is supplemented with education, support and counseling about heart failure. Directed toward relieving symptoms such as shortness of breath, decreased exercise tolerance, and extremity edema.    Expected Outcomes Long term: Adoption of self-care skills and reduction of barriers for early signs and symptoms recognition and intervention leading to self-care maintenance.;Short term: Daily weights obtained and reported for increase. Utilizing diuretic protocols set by physician.;Short term: Attendance in program 2-3 days a week with increased exercise capacity. Reported lower sodium intake. Reported increased fruit and vegetable intake. Reports medication compliance.;Improve functional capacity of life    Hypertension Yes    Intervention Provide education on lifestyle modifcations including regular physical activity/exercise, weight management, moderate sodium restriction and increased  consumption of fresh fruit, vegetables, and low fat dairy, alcohol moderation, and smoking cessation.;Monitor prescription use compliance.    Expected Outcomes Short Term: Continued assessment and intervention until BP is < 140/916mHG in hypertensive participants. < 130/8031mG in hypertensive participants with diabetes, heart failure or chronic kidney disease.;Long Term: Maintenance of blood pressure at goal levels.    Lipids Yes    Intervention Provide education and support for participant on nutrition & aerobic/resistive exercise along with prescribed medications to achieve LDL <54m18mDL >40mg64m Expected Outcomes Long Term: Cholesterol controlled with medications as prescribed, with individualized exercise RX and with personalized nutrition plan. Value goals: LDL < 54mg,54m > 40 mg.;Short Term: Participant states understanding of desired cholesterol values and is compliant with medications prescribed. Participant is following exercise prescription and nutrition guidelines.    Stress Yes    Intervention Offer individual and/or small group education and counseling on adjustment to heart disease, stress management and health-related lifestyle change. Teach and support self-help strategies.;Refer participants experiencing significant psychosocial distress to appropriate mental health specialists for further evaluation and treatment. When possible, include family members and significant others in education/counseling sessions.    Expected Outcomes Short Term: Participant demonstrates changes in health-related behavior, relaxation and other stress management skills, ability to obtain effective social support, and compliance with psychotropic medications if prescribed.;Long Term: Emotional wellbeing is indicated by absence of clinically significant psychosocial distress or social isolation.              Personal Goals Discharge:  Goals and Risk Factor Review     Row Name 01/25/22 1154 02/15/22  0937           Core Components/Risk Factors/Patient Goals Review  Personal Goals Review Weight Management/Obesity;Heart Failure;Stress;Hypertension;Lipids;Tobacco Cessation Weight Management/Obesity;Heart Failure;Stress;Hypertension;Lipids;Tobacco Cessation      Review Chrishas been doing well with exercise at a low level due to continued BP elevations and deconditoning. Dr [name] has been notifed about BP elelvations. BP's have been variable.[name] continue to smoke cigarettes and marajuana. Will continue to montior BP. [name] is enjoying participating in cardiac rehab. [name] has been able to increase his work loads. Blood pressures has been improved.[name] continue to smoke cigarettes and marajuana.  [name] is still  enjoying participating in cardiac rehab. [name] will compete cardiac rehab a the end of the month.      Expected Outcomes [name] will continue to participate in traditional/ intensvie cardiac rehab for exercise, nutrition and lifestyle modifications [name] will continue to participate in traditional/ intensvie cardiac rehab for exercise, nutrition and lifestyle modifications               Exercise Goals and Review:  Exercise Goals     Row Name 01/03/22 1109             Exercise Goals   Increase Physical Activity Yes       Intervention Provide advice, education, support and counseling about physical activity/exercise needs.;Develop an individualized exercise prescription for aerobic and resistive training based on initial evaluation findings, risk stratification, comorbidities and participant's personal goals.       Expected Outcomes Short Term: Attend rehab on a regular basis to increase amount of physical activity.;Long Term: Add in home exercise to make exercise part of routine and to increase amount of physical activity.;Long Term: Exercising regularly at least 3-5 days a week.       Increase Strength and Stamina Yes       Intervention Provide advice, education, support and  counseling about physical activity/exercise needs.;Develop an individualized exercise prescription for aerobic and resistive training based on initial evaluation findings, risk stratification, comorbidities and participant's personal goals.       Expected Outcomes Short Term: Increase workloads from initial exercise prescription for resistance, speed, and METs.;Short Term: Perform resistance training exercises routinely during rehab and add in resistance training at home;Long Term: Improve cardiorespiratory fitness, muscular endurance and strength as measured by increased METs and functional capacity (6MWT)       Able to understand and use rate of perceived exertion (RPE) scale Yes       Intervention Provide education and explanation on how to use RPE scale       Expected Outcomes Short Term: Able to use RPE daily in rehab to express subjective intensity level;Long Term:  Able to use RPE to guide intensity level when exercising independently       Knowledge and understanding of Target Heart Rate Range (THRR) Yes       Intervention Provide education and explanation of THRR including how the numbers were predicted and where they are located for reference       Expected Outcomes Short Term: Able to state/look up THRR;Short Term: Able to use daily as guideline for intensity in rehab;Long Term: Able to use THRR to govern intensity when exercising independently       Understanding of Exercise Prescription Yes       Intervention Provide education, explanation, and written materials on patient's individual exercise prescription       Expected Outcomes Short Term: Able to explain program exercise prescription;Long Term: Able to explain home exercise prescription to exercise independently  Exercise Goals Re-Evaluation:  Exercise Goals Re-Evaluation     Row Name 01/11/22 1650 01/25/22 1618 02/10/22 1642 02/13/22 1641 03/08/22 1644     Exercise Goal Re-Evaluation   Exercise Goals Review  Increase Physical Activity;Increase Strength and Stamina;Able to understand and use rate of perceived exertion (RPE) scale;Knowledge and understanding of Target Heart Rate Range (THRR);Understanding of Exercise Prescription Increase Physical Activity;Increase Strength and Stamina;Able to understand and use rate of perceived exertion (RPE) scale;Knowledge and understanding of Target Heart Rate Range (THRR);Understanding of Exercise Prescription Increase Physical Activity;Increase Strength and Stamina;Able to understand and use rate of perceived exertion (RPE) scale;Knowledge and understanding of Target Heart Rate Range (THRR);Understanding of Exercise Prescription Increase Physical Activity;Increase Strength and Stamina;Able to understand and use rate of perceived exertion (RPE) scale;Knowledge and understanding of Target Heart Rate Range (THRR);Understanding of Exercise Prescription Increase Physical Activity;Increase Strength and Stamina;Able to understand and use rate of perceived exertion (RPE) scale;Knowledge and understanding of Target Heart Rate Range (THRR);Understanding of Exercise Prescription   Comments Pt first day in the CRP2 program. Pt started on Nustep and discontinued due to high BP, started walking on track, recheck BP was still elevated. Consult with Nurse and RD for the rest of the session. Reviewed MET's goals and home ExRx. Pt tolerated exercise well with an average MET level of 1.9. Pts BP and HR are improving, so reviewed home ExRx today. Pt would like to participate in many high intensity exercises such as boxing and wt training. Since pt is awaiting ICD placement advised to stick with moderate intensity exercise until reevaluated by cardiology and ICD is in place. Advised to keep activity within his THRR of 68-135 BPM and BP not over 190/100 and advised that high BP is not ideal and can cause more harm over time. Pt feels good about his goals of gaining better heart health and is working on  a diet change and less meat intake. Pt is still working on strength, stamina and energy and states he doesnt feel as stong since discontinuing his supplements Reviewed MET's. Pt tolerated exercise well with an average MET level of 2.45. Pt is working on sustaining higher RPM and SPM to increase endurance Reviewed MET's and goals. Pt tolerated exercise well with an average MET level of 2.35. Pt states he does not feel like he is reaching his goals of gaining strenght stamina and heart health. Asked pt what he could do to help get to his goals, pt says he will try to make better food choices this week, and said he has not been doing any extra exercise on his own but plans to start this week. Continued to encourage increase in SPM and RPM while in the program because he has the capacity for more. and encouraged he has increase support for nutrition choices going forward. Pt graduated the CRP2 program. Pt tolerated exercise well with an average MET level of 2.05. Pt feels good about the progress he made and will continue to exercise on his own by walking and going to the gym 5 days a week 30-45 min per session   Expected Outcomes Will continue to monitor pt and progress workloads as tolerated without sign or symptom Pt will exercise 2 days on his own within his parameters. Will continue to monitor pt and progress workloads as tolerated without sign or symptom Will continue to monitor pt and progress workloads as tolerated without sign or symptom Will continue to monitor pt and progress workloads as tolerated without sign or symptom  Pt will continue to exercise on his own and gain strength            Nutrition & Weight - Outcomes:  Pre Biometrics - 01/03/22 0842       Pre Biometrics   Waist Circumference 43.75 inches    Hip Circumference 41 inches    Waist to Hip Ratio 1.07 %    Triceps Skinfold 7 mm    % Body Fat 28.4 %    Grip Strength 50 kg    Flexibility 12.5 in    Single Leg Stand 7.5 seconds              Post Biometrics - 03/08/22 1649        Post  Biometrics   Height _0  (1.753 m)    Weight 108.4 kg    Waist Circumference 45.5 inches    Hip Circumference 42.5 inches    Waist to Hip Ratio 1.07 %    BMI (Calculated) 35.27    Triceps Skinfold 13 mm    % Body Fat 31.7 %    Grip Strength 51 kg    Flexibility 13.25 in    Single Leg Stand 30 seconds             Nutrition:  Nutrition Therapy & Goals - 02/24/22 1536       Nutrition Therapy   Diet Heart Healthy Diet      Personal Nutrition Goals   Nutrition Goal Patient to identify strategies for managing cardiovascular risk by attending the weekly [employer] education and nutrition courses    Personal Goal #2 Patient to limit to <1574m of sodium daily    Personal Goal #3 Patient to identify food sources and limit daily intake of saturated fat, trans fat, sodium, and refined carbohydrates    Comments Goals in progress. CGerald Stabscontinues to attend the [employer]. He reports regular intake of fruits/vegetables and whole grains and reduced saturated fat intake. He does eat out a fair amount due to homelessness/living instability, so sodium intake remains variable. He is followed by nephrology; nephrology recommended reducing high potassium foods and gave educational information. His blood pressure control has improved since starting with our program and patient has better understanding of reading food labels for sodium. [place] Support [place]; he declined follow-up with social work at this time [place] [facility] [place] [facility] [place] [facility]      Intervention Plan   Intervention Prescribe, educate and counsel regarding individualized specific dietary modifications aiming towards targeted core components such as weight, hypertension, lipid management, diabetes, heart failure and other comorbidities.;Nutrition handout(s) given to patient.    Expected Outcomes Short Term Goal:  Understand basic principles of dietary content, such as calories, fat, sodium, cholesterol and nutrients.;Long Term Goal: Adherence to prescribed nutrition plan.;Short Term Goal: A plan has been developed with personal nutrition goals set during dietitian appointment.             Nutrition Discharge:  Nutrition Assessments - 03/09/22 0837       Rate Your Plate Scores   Post Score 78             Education Questionnaire Score:  Knowledge Questionnaire Score - 03/08/22 1412       Knowledge Questionnaire Score   Post Score 25/28             Goals reviewed with patient; copy given to patient.Pt graduates from  Intensive/Traditional cardiac rehab  program on 03/08/22  with completion of  15 exercise and education sessions. Pt maintained good attendance and progressed nicely during their participation in rehab as evidenced by increased MET level.   Medication list reconciled. Repeat  PHQ score- 0 .  Pt has made significant lifestyle changes and should be commended for their success. [name] achieved their goals during cardiac rehab.   Pt plans to continue exercise by walking his dog and going to the gym.

## 2022-03-15 NOTE — Telephone Encounter (Signed)
Dr Caryl Comes and Dr [name] have discussed possible next steps.  Pt is scheduled to see Dr [name] 03/22/2022.

## 2022-03-22 ENCOUNTER — Encounter (HOSPITAL_COMMUNITY): Payer: Medicaid Other | Admitting: Cardiology

## 2022-03-27 ENCOUNTER — Ambulatory Visit (HOSPITAL_COMMUNITY)
Admission: EM | Admit: 2022-03-27 | Discharge: 2022-03-27 | Disposition: A | Discharge status: Home / Self Care | Payer: Medicaid Other | Attending: Internal Medicine | Admitting: Internal Medicine

## 2022-03-27 ENCOUNTER — Encounter (HOSPITAL_COMMUNITY): Payer: Self-pay

## 2022-03-27 ENCOUNTER — Ambulatory Visit (INDEPENDENT_AMBULATORY_CARE_PROVIDER_SITE_OTHER): Payer: Medicaid Other

## 2022-03-27 ENCOUNTER — Other Ambulatory Visit (HOSPITAL_COMMUNITY): Payer: Self-pay

## 2022-03-27 DIAGNOSIS — M19079 Primary osteoarthritis, unspecified ankle and foot: Secondary | ICD-10-CM | POA: Insufficient documentation

## 2022-03-27 DIAGNOSIS — M79671 Pain in right foot: Secondary | ICD-10-CM | POA: Diagnosis not present

## 2022-03-27 DIAGNOSIS — M25571 Pain in right ankle and joints of right foot: Secondary | ICD-10-CM | POA: Diagnosis not present

## 2022-03-27 LAB — CBC
HCT: 45.6 % (ref 39.0–52.0)
Hemoglobin: 14.9 g/dL (ref 13.0–17.0)
MCH: 29.7 pg (ref 26.0–34.0)
MCHC: 32.7 g/dL (ref 30.0–36.0)
MCV: 90.8 fL (ref 80.0–100.0)
Platelets: 274 10*3/uL (ref 150–400)
RBC: 5.02 MIL/uL (ref 4.22–5.81)
RDW: 15.9 % — ABNORMAL HIGH (ref 11.5–15.5)
WBC: 7.7 10*3/uL (ref 4.0–10.5)
nRBC: 0 % (ref 0.0–0.2)

## 2022-03-27 LAB — BASIC METABOLIC PANEL
Anion gap: 13 (ref 5–15)
BUN: 21 mg/dL — ABNORMAL HIGH (ref 6–20)
CO2: 24 mmol/L (ref 22–32)
Calcium: 9.2 mg/dL (ref 8.9–10.3)
Chloride: 100 mmol/L (ref 98–111)
Creatinine, Ser: 2.72 mg/dL — ABNORMAL HIGH (ref 0.61–1.24)
GFR, Estimated: 27 mL/min — ABNORMAL LOW (ref >60)
Glucose, Bld: 97 mg/dL (ref 70–99)
Potassium: 3.6 mmol/L (ref 3.5–5.1)
Sodium: 137 mmol/L (ref 135–145)

## 2022-03-27 LAB — URIC ACID: Uric Acid, Serum: 9.8 mg/dL — ABNORMAL HIGH (ref 3.7–8.6)

## 2022-03-27 MED ORDER — PREDNISONE 20 MG PO TABS
40.0000 mg | ORAL_TABLET | Freq: Every day | ORAL | 0 refills | Status: AC
  Filled 2022-03-27: qty 10, 5d supply, fill #0

## 2022-03-27 MED ORDER — METHYLPREDNISOLONE SODIUM SUCC 125 MG IJ SOLR
80.0000 mg | Freq: Once | INTRAMUSCULAR | Status: AC
  Administered 2022-03-27: 80 mg via INTRAMUSCULAR

## 2022-03-27 MED ORDER — METHYLPREDNISOLONE SODIUM SUCC 125 MG IJ SOLR
INTRAMUSCULAR | Status: AC
  Filled 2022-03-27: qty 2

## 2022-03-27 NOTE — Discharge Instructions (Addendum)
We drew blood work today in the clinic to assess for possible gouty arthritis cause for your foot pain, swelling, and redness.  If there is anything abnormal on your blood work, we will call you with the results to update you.  We gave you a steroid injection in the clinic to help with your pain and swelling of your foot.  Start taking 40 mg of prednisone once daily starting tomorrow.  Do not take any ibuprofen since this can make your kidney function worse.  You may take Tylenol as needed every 6 hours.  Continue taking all of your other medications as prescribed.  You may wear compression socks to help reduce the swelling and elevate your legs.  If you develop any new or worsening symptoms or do not improve in the next 2 to 3 days, please return.  If your symptoms are severe, please go to the emergency room.  Follow-up with your primary care provider for further evaluation and management of your symptoms as well as ongoing wellness visits.  I hope you feel better!
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
## 2022-03-28 ENCOUNTER — Other Ambulatory Visit (HOSPITAL_COMMUNITY): Payer: Self-pay

## 2022-04-07 ENCOUNTER — Other Ambulatory Visit (HOSPITAL_COMMUNITY): Payer: Self-pay

## 2022-04-07 ENCOUNTER — Other Ambulatory Visit (HOSPITAL_COMMUNITY): Payer: Self-pay | Admitting: Cardiology

## 2022-04-07 DIAGNOSIS — I5042 Chronic combined systolic (congestive) and diastolic (congestive) heart failure: Secondary | ICD-10-CM

## 2022-04-07 MED ORDER — ISOSORBIDE MONONITRATE ER 30 MG PO TB24
15.0000 mg | ORAL_TABLET | Freq: Every day | ORAL | 3 refills | Status: DC
  Filled 2022-04-07: qty 15, 30d supply, fill #0

## 2022-04-17 DIAGNOSIS — N1832 Chronic kidney disease, stage 3b: Secondary | ICD-10-CM | POA: Diagnosis not present

## 2022-04-17 DIAGNOSIS — I129 Hypertensive chronic kidney disease with stage 1 through stage 4 chronic kidney disease, or unspecified chronic kidney disease: Secondary | ICD-10-CM | POA: Diagnosis not present

## 2022-04-17 DIAGNOSIS — N2581 Secondary hyperparathyroidism of renal origin: Secondary | ICD-10-CM | POA: Diagnosis not present

## 2022-04-17 DIAGNOSIS — D631 Anemia in chronic kidney disease: Secondary | ICD-10-CM | POA: Diagnosis not present

## 2022-04-18 ENCOUNTER — Other Ambulatory Visit (HOSPITAL_COMMUNITY): Payer: Self-pay

## 2022-05-05 ENCOUNTER — Emergency Department (HOSPITAL_COMMUNITY): Payer: Medicaid Other

## 2022-05-05 ENCOUNTER — Emergency Department (HOSPITAL_COMMUNITY)
Admission: EM | Admit: 2022-05-05 | Discharge: 2022-05-05 | Disposition: A | Discharge status: Home / Self Care | Payer: Medicaid Other | Attending: Emergency Medicine | Admitting: Emergency Medicine

## 2022-05-05 DIAGNOSIS — I130 Hypertensive heart and chronic kidney disease with heart failure and stage 1 through stage 4 chronic kidney disease, or unspecified chronic kidney disease: Secondary | ICD-10-CM | POA: Diagnosis not present

## 2022-05-05 DIAGNOSIS — Z951 Presence of aortocoronary bypass graft: Secondary | ICD-10-CM | POA: Diagnosis not present

## 2022-05-05 DIAGNOSIS — N50811 Right testicular pain: Secondary | ICD-10-CM | POA: Diagnosis present

## 2022-05-05 DIAGNOSIS — N451 Epididymitis: Secondary | ICD-10-CM | POA: Insufficient documentation

## 2022-05-05 DIAGNOSIS — I2510 Atherosclerotic heart disease of native coronary artery without angina pectoris: Secondary | ICD-10-CM | POA: Insufficient documentation

## 2022-05-05 DIAGNOSIS — N183 Chronic kidney disease, stage 3 unspecified: Secondary | ICD-10-CM | POA: Diagnosis not present

## 2022-05-05 DIAGNOSIS — Z79899 Other long term (current) drug therapy: Secondary | ICD-10-CM | POA: Diagnosis not present

## 2022-05-05 DIAGNOSIS — N5089 Other specified disorders of the male genital organs: Secondary | ICD-10-CM

## 2022-05-05 DIAGNOSIS — Z7982 Long term (current) use of aspirin: Secondary | ICD-10-CM | POA: Insufficient documentation

## 2022-05-05 DIAGNOSIS — I5040 Unspecified combined systolic (congestive) and diastolic (congestive) heart failure: Secondary | ICD-10-CM | POA: Insufficient documentation

## 2022-05-05 LAB — CBC
HCT: 46.7 % (ref 39.0–52.0)
Hemoglobin: 15.7 g/dL (ref 13.0–17.0)
MCH: 29.6 pg (ref 26.0–34.0)
MCHC: 33.6 g/dL (ref 30.0–36.0)
MCV: 88.1 fL (ref 80.0–100.0)
Platelets: 281 10*3/uL (ref 150–400)
RBC: 5.3 MIL/uL (ref 4.22–5.81)
RDW: 15.9 % — ABNORMAL HIGH (ref 11.5–15.5)
WBC: 8.8 10*3/uL (ref 4.0–10.5)
nRBC: 0 % (ref 0.0–0.2)

## 2022-05-05 LAB — URINALYSIS, ROUTINE W REFLEX MICROSCOPIC
Bacteria, UA: NONE SEEN
Bilirubin Urine: NEGATIVE
Glucose, UA: NEGATIVE mg/dL
Ketones, ur: 5 mg/dL — AB
Nitrite: NEGATIVE
Protein, ur: >=300 mg/dL — AB
Specific Gravity, Urine: 1.026 (ref 1.005–1.030)
WBC, UA: >50 WBC/hpf — ABNORMAL HIGH (ref 0–5)
pH: 5 (ref 5.0–8.0)

## 2022-05-05 LAB — BASIC METABOLIC PANEL
Anion gap: 11 (ref 5–15)
BUN: 12 mg/dL (ref 6–20)
CO2: 24 mmol/L (ref 22–32)
Calcium: 9.4 mg/dL (ref 8.9–10.3)
Chloride: 102 mmol/L (ref 98–111)
Creatinine, Ser: 2.27 mg/dL — ABNORMAL HIGH (ref 0.61–1.24)
GFR, Estimated: 34 mL/min — ABNORMAL LOW (ref >60)
Glucose, Bld: 115 mg/dL — ABNORMAL HIGH (ref 70–99)
Potassium: 3.3 mmol/L — ABNORMAL LOW (ref 3.5–5.1)
Sodium: 137 mmol/L (ref 135–145)

## 2022-05-05 MED ORDER — DOXYCYCLINE HYCLATE 100 MG PO CAPS
100.0000 mg | ORAL_CAPSULE | Freq: Two times a day (BID) | ORAL | 0 refills | Status: DC
  Filled 2022-05-05: qty 20, 10d supply, fill #0

## 2022-05-05 MED ORDER — HYDROCODONE-ACETAMINOPHEN 5-325 MG PO TABS: 2.0000 | ORAL_TABLET | Freq: Four times a day (QID) | ORAL | 0 refills | Status: DC | PRN

## 2022-05-05 MED ORDER — HYDROCODONE-ACETAMINOPHEN 5-325 MG PO TABS
2.0000 | ORAL_TABLET | Freq: Four times a day (QID) | ORAL | 0 refills | Status: DC | PRN
  Filled 2022-05-05: qty 8, 1d supply, fill #0

## 2022-05-05 MED ORDER — DOXYCYCLINE HYCLATE 100 MG PO CAPS: 100.0000 mg | ORAL_CAPSULE | Freq: Two times a day (BID) | ORAL | 0 refills | Status: AC

## 2022-05-05 MED ORDER — CEFTRIAXONE SODIUM 500 MG IJ SOLR
500.0000 mg | Freq: Once | INTRAMUSCULAR | Status: AC
  Administered 2022-05-05: 500 mg via INTRAMUSCULAR
  Filled 2022-05-05: qty 500

## 2022-05-05 MED ORDER — OXYCODONE-ACETAMINOPHEN 5-325 MG PO TABS
1.0000 | ORAL_TABLET | Freq: Once | ORAL | Status: AC
  Administered 2022-05-05: 1 via ORAL
  Filled 2022-05-05: qty 1

## 2022-05-05 MED ORDER — HYDRALAZINE HCL 25 MG PO TABS
100.0000 mg | ORAL_TABLET | Freq: Once | ORAL | Status: AC
  Administered 2022-05-05: 100 mg via ORAL
  Filled 2022-05-05: qty 4

## 2022-05-05 NOTE — ED Notes (Signed)
RN reviewed discharge instructions with pt. Pt verbalized understanding and had no further questions. VSS upon discharge.  

## 2022-05-05 NOTE — ED Provider Notes (Signed)
[facility] EMERGENCY DEPARTMENT Provider Note   CSN: [id number] Arrival date & time: 05/05/22  1712     History  Chief Complaint  Patient presents with   Groin Swelling    Scott Salinas is a 51 y.o. male.  Past medical history of stage III CKD, CAD s/p CABG x 4, combined systolic and diastolic CHF, obesity, hypertension, ACE inhibitor aggravated angioedema, hyperlipidemia.  Presents to the emergency department today for right testicular pain and swelling for the past 3 days or so.  Has been progressively worsening.  Additionally has had some lower abdominal pressure and lower back pressure.  No dysuria or hematuria.  No penile discharge.  No genital lesions or ulcers.  Has not taken his blood pressure medication in 3 days.  Denies chest pain or shortness of breath.  Is sexually active.  Denies any fevers, chills, night sweats.  HPI     Home Medications Prior to Admission medications   Medication Sig Start Date End Date Taking? Authorizing Provider  amLODipine (NORVASC) 10 MG tablet Take 1 tablet (10 mg total) by mouth daily. 03/06/22   Larey Dresser, MD  aspirin EC 81 MG tablet Take 1 tablet (81 mg total) by mouth daily. Swallow whole. 12/21/21   [place], Maricela Bo, FNP  carvedilol (COREG) 25 MG tablet Take 1 tablet (25 mg total) by mouth 2 (two) times daily. 05/03/21   Larey Dresser, MD  doxycycline (VIBRAMYCIN) 100 MG capsule Take 1 capsule (100 mg total) by mouth 2 (two) times daily. 05/05/22   Phyllis Ginger, MD  Evolocumab with Infusor ([place]) 420 MG/3.5ML SOCT Inject 1 Dose (420 mg) into the skin every 30 (thirty) days. 03/06/22   Hilty, Nadean Corwin, MD  hydrALAZINE (APRESOLINE) 100 MG tablet Take 1 tablet (100 mg total) by mouth 3 (three) times daily. 05/03/21   Larey Dresser, MD  HYDROcodone-acetaminophen (NORCO/VICODIN) 5-325 MG tablet Take 2 tablets by mouth every 6 (six) hours as needed. 05/05/22   Phyllis Ginger, MD   isosorbide mononitrate (IMDUR) 30 MG 24 hr tablet Take 0.5 tablets (15 mg total) by mouth daily. 04/07/22   Larey Dresser, MD  NON FORMULARY Take 1 Dose by mouth daily. Mixed in juice ([place] Root) Patient not taking: Reported on 03/08/2022    [provider]  OVER THE COUNTER MEDICATION Take 1 Scoop by mouth in the morning. Organic Beet Root Powder Nitric Oxide Supplement Patient not taking: Reported on 03/08/2022    [provider]  potassium chloride SA (KLOR-CON M) 20 MEQ tablet Take 3 tablets (60 mEq total) by mouth 2 (two) times daily. 01/16/22   Larey Dresser, MD  spironolactone (ALDACTONE) 25 MG tablet Take 0.5 tablets (12.5 mg total) by mouth daily. 03/06/22   Larey Dresser, MD  torsemide (DEMADEX) 20 MG tablet Take 4 tablets (80 mg total) by mouth 2 (two) times daily. 03/06/22   Larey Dresser, MD      Allergies    Lisinopril    Review of Systems   Review of Systems  Physical Exam Updated Vital Signs BP (!) 212/141   Pulse 84   Temp 98 F (36.7 C) (Oral)   Resp 19   Ht 5\' 10"  (1.778 m)   Wt 108.4 kg   SpO2 99%   BMI 34.29 kg/m  Physical Exam Vitals and nursing note reviewed.  Constitutional:      General: He is not in acute distress.    Appearance: He  is well-developed. He is not ill-appearing or diaphoretic.  HENT:     Head: Normocephalic and atraumatic.     Right Ear: External ear normal.     Left Ear: External ear normal.  Eyes:     Conjunctiva/sclera: Conjunctivae normal.  Cardiovascular:     Rate and Rhythm: Normal rate and regular rhythm.     Heart sounds: No murmur heard. Pulmonary:     Effort: Pulmonary effort is normal. No respiratory distress.     Breath sounds: Normal breath sounds.  Abdominal:     General: Abdomen is flat.     Palpations: Abdomen is soft.     Tenderness: There is no abdominal tenderness. There is no guarding or rebound.     Hernia: No hernia is present. There is no hernia in the left inguinal area or  right inguinal area.  Genitourinary:    Penis: Normal.      Testes:        Right: Tenderness and swelling present. Mass or testicular hydrocele not present. Right testis is descended. Cremasteric reflex is present.         Left: Mass, tenderness, swelling or testicular hydrocele not present. Left testis is descended. Cremasteric reflex is present.      Epididymis:     Right: Inflamed and enlarged. Tenderness present. No mass.     Left: Not inflamed or enlarged. No mass or tenderness.  Musculoskeletal:        General: No swelling.     Cervical back: Neck supple.  Lymphadenopathy:     Lower Body: No right inguinal adenopathy. No left inguinal adenopathy.  Skin:    General: Skin is warm and dry.     Capillary Refill: Capillary refill takes less than 2 seconds.  Neurological:     Mental Status: He is alert.  Psychiatric:        Mood and Affect: Mood normal.     ED Results / Procedures / Treatments   Labs (all labs ordered are listed, but only abnormal results are displayed) Labs Reviewed  CBC - Abnormal; Notable for the following components:      Result Value   RDW 15.9 (*)    All other components within normal limits  BASIC METABOLIC PANEL - Abnormal; Notable for the following components:   Potassium 3.3 (*)    Glucose, Bld 115 (*)    Creatinine, Ser 2.27 (*)    GFR, Estimated 34 (*)    All other components within normal limits  URINALYSIS, ROUTINE W REFLEX MICROSCOPIC - Abnormal; Notable for the following components:   APPearance CLOUDY (*)    Hgb urine dipstick MODERATE (*)    Ketones, ur 5 (*)    Protein, ur >=300 (*)    Leukocytes,Ua SMALL (*)    WBC, UA >50 (*)    Non Squamous Epithelial 0-5 (*)    All other components within normal limits  GC/CHLAMYDIA PROBE AMP ([place]uth) NOT AT [facility]    EKG None  Radiology US SCROTUM W/DOPPLER  Result Date: 05/05/2022 CLINICAL DATA:  right testicle pain EXAM: ULTRASOUND OF SCROTUM TECHNIQUE: Complete ultrasound  examination of the testicles, epididymis, and other scrotal structures was performed. COMPARISON:  05/28/2016 FINDINGS: The right testis measured 3.9 x 4.4 x 2.9 cm, and the left testis measured 4.7 x 3.9 x 3.0 cm. The testes demonstrated blood flow with Doppler. Left epididymis was unremarkable. Right epididymis is prominent, heterogeneous and hypervascular consistent with epididymitis. No fluid collections or masses were seen.  IMPRESSION: Right-sided epididymitis.  No testicular abnormalities. Electronically Signed   By: Sammie Bench M.D.   On: 05/05/2022 18:34    Procedures Procedures    Medications Ordered in ED Medications  hydrALAZINE (APRESOLINE) tablet 100 mg (100 mg Oral Given 05/05/22 2308)  cefTRIAXone (ROCEPHIN) injection 500 mg (500 mg Intramuscular Given 05/05/22 2309)  oxyCODONE-acetaminophen (PERCOCET/ROXICET) 5-325 MG per tablet 1 tablet (1 tablet Oral Given 05/05/22 2314)    ED Course/ Medical Decision Making/ A&P                           Medical Decision Making Problems Addressed: Epididymitis: acute illness or injury with systemic symptoms Pain in right testicle: acute illness or injury that poses a threat to life or bodily functions Testicular swelling: acute illness or injury that poses a threat to life or bodily functions  Amount and/or Complexity of Data Reviewed Independent Historian: friend    Details: Significant other Labs: ordered. Decision-making details documented in ED Course. Radiology: ordered and independent interpretation performed. Decision-making details documented in ED Course.  Risk Prescription drug management.   This is a 51 y.o. male who presents to the emergency department with right testicular pain and swelling. History is obtained from patient. Medical history reviewed in EMR.  Past medical/surgical history that increases complexity of ED encounter:  stage III CKD, CAD s/p CABG x 4, combined systolic and diastolic CHF, obesity,  hypertension, ACE inhibitor aggravated angioedema, hyperlipidemia  Initial Assessment:  On initial evaluation, patient has pain and swelling around the right testicle, with severe tenderness to palpation around the epididymis.  I am concerned for epididymitis.  He is sexually active, denies any dysuria.  Continues to have pain and swelling.  Vital signs significant for hypertension.  Has not taken his antihypertensives in 3 days.  Otherwise vital signs are unremarkable.  Afebrile.  Initial Differential Diagnosis : Epididymitis, orchitis, testicular mass, testicular torsion, STI/STD, UTI  Initial Plan: CBC, BMP, urinalysis, urine GC chlamydia Scrotal/testicular ultrasound  Interpretation of Diagnostics: I personally reviewed the labs and ultrasound and my interpretation is as follows: No leukocytosis, BMP is stable with creatinine consistent with his stage III CKD.  Urinalysis with small amount of leukocytes and white blood cells but no bacteria.  Consistent with irritation of local inflammation from his epididymitis seen on ultrasound with hyperemic and inflamed epididymis.  Additionally, since patient is hypertensive, will give home hydralazine 100 mg.  He has not had any medications in the past couple of days.  He is otherwise asymptomatic, denying any chest pain, shortness of breath, headache, vision changes.  No signs of endorgan dysfunction on labs.  Will treat him for his epididymitis with Rocephin IM milligrams here and a prescription for doxycycline 100 mg twice daily for the next 10 days.  Additionally, because of severe pain, will give a few Norco for pain management as well as recommending Tylenol and ibuprofen as well as continued icing for swelling and anti-inflammatory properties.  Patient stable for discharge home.  encouraged him to follow-up with his cardiologist regarding his hypertension.  Provided information to follow-up with urology if he is not having relief of his symptoms  within the next 48 to 72 hours on antibiotics.  MDM generated using voice dictation software and may contain dictation errors. Please contact me for any clarification or with any questions.          Final Clinical Impression(s) / ED Diagnoses Final diagnoses:  Epididymitis  Testicular swelling  Pain in right testicle    Rx / DC Orders ED Discharge Orders          Ordered    doxycycline (VIBRAMYCIN) 100 MG capsule  2 times daily,   Status:  Discontinued        05/05/22 2302    HYDROcodone-acetaminophen (NORCO/VICODIN) 5-325 MG tablet  Every 6 hours PRN,   Status:  Discontinued        05/05/22 2302    Ambulatory referral to Cardiology       Comments: If you have not heard from the Cardiology office within the next 72 hours please call [phone].   05/05/22 2302    doxycycline (VIBRAMYCIN) 100 MG capsule  2 times daily        05/05/22 2309    HYDROcodone-acetaminophen (NORCO/VICODIN) 5-325 MG tablet  Every 6 hours PRN        05/05/22 2309              Phyllis Ginger, MD 05/05/22 4825    Malvin Johns, MD 05/05/22 2343

## 2022-05-05 NOTE — ED Triage Notes (Signed)
Pt to ED c/o right testicle pain/swelling x 3days,

## 2022-05-05 NOTE — ED Notes (Signed)
Pt encouraged to urinate

## 2022-05-05 NOTE — Discharge Instructions (Signed)
[name]:  Thank you for allowing [place] to take care of you today.  We hope you begin feeling better soon. You were seen today for testicular pain and swelling.  You are found to have epididymitis, which is an infection of the epididymis which surrounds your testicle.  This is a very painful condition, it is usually caused by an infection.  We are going to treat you with antibiotics.  We have given you 1 shot in the emergency department and another 1 for the next 10 days.  I have sent this to the nearby [facility] to pick up on your way home.  Additionally, your blood pressure is very high.  I recommend you follow-up with your primary doctor or your cardiologist on this to make sure you do not need to make any blood pressure medication changes  To-Do:  Please follow-up with your primary doctor within the next 2-3 days. It is important that you review any labs or imaging results (if any) that you had today with them. Your preliminary imaging results (if any) are attached. Please return to the Emergency Department or call 911 if you experience chest pain, shortness of breath, severe pain, severe fever, altered mental status, or have any reason to think that you need emergency medical care.  Thank you again.  Hope you feel better soon.  Phyllis Ginger, MD Department of Emergency Medicine

## 2022-05-05 NOTE — ED Provider Triage Note (Signed)
Emergency Medicine Provider Triage Evaluation Note  Scott Salinas , a 51 y.o. male  was evaluated in triage.  Pt complains of right testicle pain. States that same has been ongoing for the past 3 days since he smashed it against something. He states he has been icing it with no relief. Denies any dysuria or hematuria.  States he has not taken his blood pressure medication in 3 days.  Denies chest pain or shortness of breath.  Review of Systems  Positive:  Negative:   Physical Exam  BP (!) 219/141 (BP Location: Right Arm)   Pulse 79   Temp 98.2 F (36.8 C) (Oral)   Resp 16   Ht 5\' 10"  (1.778 m)   Wt 108.4 kg   SpO2 96%   BMI 34.29 kg/m  Gen:   Awake, no distress   Resp:  Normal effort  MSK:   Moves extremities without difficulty  Other:    Medical Decision Making  Medically screening exam initiated at 5:40 PM.  Appropriate orders placed.  Scott Salinas was informed that the remainder of the evaluation will be completed by another provider, this initial triage assessment does not replace that evaluation, and the importance of remaining in the ED until their evaluation is complete.     Bud Face, PA-C 05/05/22 1743

## 2022-05-06 ENCOUNTER — Other Ambulatory Visit: Payer: Self-pay

## 2022-05-06 ENCOUNTER — Other Ambulatory Visit (HOSPITAL_COMMUNITY): Payer: Self-pay

## 2022-05-09 LAB — GC/CHLAMYDIA PROBE AMP (~~LOC~~) NOT AT ARMC
Chlamydia: NEGATIVE
Comment: NEGATIVE
Comment: NORMAL
Neisseria Gonorrhea: NEGATIVE

## 2022-05-10 ENCOUNTER — Other Ambulatory Visit (HOSPITAL_COMMUNITY): Payer: Self-pay

## 2022-05-10 ENCOUNTER — Encounter (HOSPITAL_COMMUNITY): Payer: Self-pay | Admitting: Cardiology

## 2022-05-10 ENCOUNTER — Emergency Department (HOSPITAL_COMMUNITY)
Admission: EM | Admit: 2022-05-10 | Discharge: 2022-05-10 | Discharge status: Against Medical Advice | Payer: Medicaid Other

## 2022-05-10 ENCOUNTER — Ambulatory Visit (HOSPITAL_COMMUNITY)
Admission: RE | Admit: 2022-05-10 | Discharge: 2022-05-10 | Disposition: A | Discharge status: Home / Self Care | Payer: Medicaid Other | Source: Ambulatory Visit | Attending: Cardiology | Admitting: Cardiology

## 2022-05-10 VITALS — BP 132/78 | HR 72 | Wt 235.0 lb

## 2022-05-10 DIAGNOSIS — F17210 Nicotine dependence, cigarettes, uncomplicated: Secondary | ICD-10-CM | POA: Diagnosis not present

## 2022-05-10 DIAGNOSIS — I5022 Chronic systolic (congestive) heart failure: Secondary | ICD-10-CM | POA: Diagnosis not present

## 2022-05-10 DIAGNOSIS — M25561 Pain in right knee: Secondary | ICD-10-CM | POA: Insufficient documentation

## 2022-05-10 DIAGNOSIS — I130 Hypertensive heart and chronic kidney disease with heart failure and stage 1 through stage 4 chronic kidney disease, or unspecified chronic kidney disease: Secondary | ICD-10-CM | POA: Diagnosis not present

## 2022-05-10 DIAGNOSIS — N5082 Scrotal pain: Secondary | ICD-10-CM | POA: Diagnosis not present

## 2022-05-10 DIAGNOSIS — M25562 Pain in left knee: Secondary | ICD-10-CM | POA: Insufficient documentation

## 2022-05-10 DIAGNOSIS — B9689 Other specified bacterial agents as the cause of diseases classified elsewhere: Secondary | ICD-10-CM | POA: Insufficient documentation

## 2022-05-10 DIAGNOSIS — Z951 Presence of aortocoronary bypass graft: Secondary | ICD-10-CM | POA: Insufficient documentation

## 2022-05-10 DIAGNOSIS — Z79899 Other long term (current) drug therapy: Secondary | ICD-10-CM | POA: Insufficient documentation

## 2022-05-10 DIAGNOSIS — E785 Hyperlipidemia, unspecified: Secondary | ICD-10-CM | POA: Insufficient documentation

## 2022-05-10 DIAGNOSIS — I5042 Chronic combined systolic (congestive) and diastolic (congestive) heart failure: Secondary | ICD-10-CM | POA: Insufficient documentation

## 2022-05-10 DIAGNOSIS — N183 Chronic kidney disease, stage 3 unspecified: Secondary | ICD-10-CM | POA: Diagnosis not present

## 2022-05-10 DIAGNOSIS — N451 Epididymitis: Secondary | ICD-10-CM | POA: Diagnosis not present

## 2022-05-10 DIAGNOSIS — Z7982 Long term (current) use of aspirin: Secondary | ICD-10-CM | POA: Diagnosis not present

## 2022-05-10 DIAGNOSIS — I2510 Atherosclerotic heart disease of native coronary artery without angina pectoris: Secondary | ICD-10-CM | POA: Insufficient documentation

## 2022-05-10 DIAGNOSIS — I255 Ischemic cardiomyopathy: Secondary | ICD-10-CM | POA: Insufficient documentation

## 2022-05-10 LAB — CBC
HCT: 42 % (ref 39.0–52.0)
Hemoglobin: 13.9 g/dL (ref 13.0–17.0)
MCH: 29.4 pg (ref 26.0–34.0)
MCHC: 33.1 g/dL (ref 30.0–36.0)
MCV: 88.8 fL (ref 80.0–100.0)
Platelets: 314 10*3/uL (ref 150–400)
RBC: 4.73 MIL/uL (ref 4.22–5.81)
RDW: 16 % — ABNORMAL HIGH (ref 11.5–15.5)
WBC: 8 10*3/uL (ref 4.0–10.5)
nRBC: 0 % (ref 0.0–0.2)

## 2022-05-10 LAB — BASIC METABOLIC PANEL
Anion gap: 12 (ref 5–15)
BUN: 18 mg/dL (ref 6–20)
CO2: 21 mmol/L — ABNORMAL LOW (ref 22–32)
Calcium: 8.7 mg/dL — ABNORMAL LOW (ref 8.9–10.3)
Chloride: 101 mmol/L (ref 98–111)
Creatinine, Ser: 2.07 mg/dL — ABNORMAL HIGH (ref 0.61–1.24)
GFR, Estimated: 38 mL/min — ABNORMAL LOW (ref >60)
Glucose, Bld: 105 mg/dL — ABNORMAL HIGH (ref 70–99)
Potassium: 3.7 mmol/L (ref 3.5–5.1)
Sodium: 134 mmol/L — ABNORMAL LOW (ref 135–145)

## 2022-05-10 LAB — URIC ACID: Uric Acid, Serum: 7.1 mg/dL (ref 3.7–8.6)

## 2022-05-10 MED ORDER — SPIRONOLACTONE 25 MG PO TABS
25.0000 mg | ORAL_TABLET | Freq: Every day | ORAL | 3 refills | Status: DC
  Filled 2022-05-10: qty 90, 90d supply, fill #0

## 2022-05-10 MED ORDER — HYDRALAZINE HCL 100 MG PO TABS
100.0000 mg | ORAL_TABLET | Freq: Three times a day (TID) | ORAL | 3 refills | Status: DC
  Filled 2022-05-10: qty 270, 90d supply, fill #0

## 2022-05-10 MED ORDER — AMLODIPINE BESYLATE 10 MG PO TABS
10.0000 mg | ORAL_TABLET | Freq: Every day | ORAL | 3 refills | Status: DC
  Filled 2022-05-10: qty 90, 90d supply, fill #0

## 2022-05-10 MED ORDER — ISOSORBIDE MONONITRATE ER 30 MG PO TB24
15.0000 mg | ORAL_TABLET | Freq: Every day | ORAL | 3 refills | Status: DC
  Filled 2022-05-10: qty 45, 90d supply, fill #0

## 2022-05-10 NOTE — Patient Instructions (Signed)
INCREASE Spironolactone to 25 mg daily   Labs done today, your results will be available in MyChart, we will contact you for abnormal readings.  Repeat blood work in 10 days  You have been referred to Dr. Caryl Comes for an ICD. His office will call you to arrange your appointment.  Your physician recommends that you schedule a follow-up appointment in: 3 months  If you have any questions or concerns before your next appointment please send [place] a message through [place] or call our office at [phone].    TO LEAVE A MESSAGE FOR THE NURSE SELECT OPTION 2, PLEASE LEAVE A MESSAGE INCLUDING: YOUR NAME DATE OF BIRTH CALL BACK NUMBER REASON FOR CALL**this is important as we prioritize the call backs  YOU WILL RECEIVE A CALL BACK THE SAME DAY AS LONG AS YOU CALL BEFORE 4:00 PM  At the [facility], you and your health needs are our priority. As part of our continuing mission to provide you with exceptional heart care, we have created designated Provider Care Teams. These Care Teams include your primary Cardiologist (physician) and Advanced Practice Providers (APPs- Physician Assistants and Nurse Practitioners) who all work together to provide you with the care you need, when you need it.   You may see any of the following providers on your designated Care Team at your next follow up: Dr Glori Bickers Dr Loralie Champagne Dr. Roxana Hires, NP Lyda Jester, [place] [facility] [place], [place] Forestine Na, NP Audry Riles, PharmD   Please be sure to bring in all your medications bottles to every appointment.
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
## 2022-05-10 NOTE — ED Notes (Signed)
Second call for triage no answer 

## 2022-05-10 NOTE — ED Notes (Signed)
Called for triage. No answer. 

## 2022-05-11 ENCOUNTER — Other Ambulatory Visit (HOSPITAL_COMMUNITY): Payer: Self-pay

## 2022-05-11 ENCOUNTER — Telehealth: Payer: Self-pay | Admitting: Pharmacist

## 2022-05-11 MED ORDER — REPATHA SURECLICK 140 MG/ML ~~LOC~~ SOAJ
1.0000 mL | SUBCUTANEOUS | 11 refills | Status: AC
  Filled 2022-05-11: qty 2, 28d supply, fill #0

## 2022-05-11 NOTE — Telephone Encounter (Signed)
Received a message from Dr. [name] about patient not being able to use his Repatha device wanted to switch back to his old device.  Looks like he was switched by Dr. Debara Pickett to the 1 pump infusion that is once a month previously was on the [facility] every 2-week injections.  I called patient to discuss, advised we could bring him in to show him how to use the on pump or we can get him changed back to his old [place].  Patient states that he would prefer to just go back to the [facility].  He states while on the phone that he was seen for UTI on Saturday started on antibiotics on Sunday states that he still in a good amount of pain.  Went to the ER yesterday but wait time was 22 hours, he does not have a PCP.  I advised that he try one of our stand-alone ERs possibly the wait would be shorter, advised in the future for him to get a PCP as they are very handy in situations like this. Attempted PA but states already approved (must include all Repatha products in PA). Rx sent to [facility]- patient made aware.

## 2022-05-11 NOTE — Telephone Encounter (Signed)
Repatha approved until 02/17/23

## 2022-05-19 ENCOUNTER — Other Ambulatory Visit (HOSPITAL_COMMUNITY): Payer: Medicaid Other

## 2022-05-19 ENCOUNTER — Other Ambulatory Visit (HOSPITAL_COMMUNITY): Payer: Self-pay

## 2022-05-19 DIAGNOSIS — N453 Epididymo-orchitis: Secondary | ICD-10-CM | POA: Diagnosis not present

## 2022-05-19 DIAGNOSIS — N50811 Right testicular pain: Secondary | ICD-10-CM | POA: Diagnosis not present

## 2022-05-19 MED ORDER — SULFAMETHOXAZOLE-TRIMETHOPRIM 800-160 MG PO TABS
1.0000 | ORAL_TABLET | Freq: Two times a day (BID) | ORAL | 0 refills | Status: AC
  Filled 2022-05-19: qty 28, 14d supply, fill #0

## 2022-05-19 MED ORDER — OXYCODONE HCL 5 MG PO TABS
5.0000 mg | ORAL_TABLET | Freq: Four times a day (QID) | ORAL | 0 refills | Status: AC | PRN
  Filled 2022-05-19: qty 10, 3d supply, fill #0

## 2022-05-22 ENCOUNTER — Ambulatory Visit (HOSPITAL_COMMUNITY)
Admission: RE | Admit: 2022-05-22 | Discharge: 2022-05-22 | Disposition: A | Discharge status: Home / Self Care | Payer: Medicaid Other | Source: Ambulatory Visit | Attending: Cardiology | Admitting: Cardiology

## 2022-05-22 DIAGNOSIS — I5042 Chronic combined systolic (congestive) and diastolic (congestive) heart failure: Secondary | ICD-10-CM | POA: Diagnosis present

## 2022-05-22 LAB — BASIC METABOLIC PANEL
Anion gap: 7 (ref 5–15)
BUN: 12 mg/dL (ref 6–20)
CO2: 25 mmol/L (ref 22–32)
Calcium: 8.7 mg/dL — ABNORMAL LOW (ref 8.9–10.3)
Chloride: 105 mmol/L (ref 98–111)
Creatinine, Ser: 2.23 mg/dL — ABNORMAL HIGH (ref 0.61–1.24)
GFR, Estimated: 35 mL/min — ABNORMAL LOW (ref >60)
Glucose, Bld: 79 mg/dL (ref 70–99)
Potassium: 4.1 mmol/L (ref 3.5–5.1)
Sodium: 137 mmol/L (ref 135–145)

## 2022-05-29 ENCOUNTER — Other Ambulatory Visit (HOSPITAL_COMMUNITY): Payer: Self-pay

## 2022-06-12 ENCOUNTER — Encounter (HOSPITAL_BASED_OUTPATIENT_CLINIC_OR_DEPARTMENT_OTHER): Payer: Self-pay

## 2022-06-12 ENCOUNTER — Ambulatory Visit (HOSPITAL_BASED_OUTPATIENT_CLINIC_OR_DEPARTMENT_OTHER): Payer: Medicaid Other | Admitting: Internal Medicine

## 2022-06-14 ENCOUNTER — Ambulatory Visit: Payer: Medicaid Other | Admitting: Internal Medicine

## 2022-06-14 DIAGNOSIS — I255 Ischemic cardiomyopathy: Secondary | ICD-10-CM

## 2022-06-14 DIAGNOSIS — I5022 Chronic systolic (congestive) heart failure: Secondary | ICD-10-CM

## 2022-07-20 ENCOUNTER — Ambulatory Visit: Payer: Medicaid Other | Attending: Internal Medicine | Admitting: Internal Medicine

## 2022-08-18 ENCOUNTER — Telehealth (HOSPITAL_COMMUNITY): Payer: Self-pay

## 2022-08-18 NOTE — Telephone Encounter (Signed)
Called and left patient a voice message to confirm/remind patient of their appointment at [facility] on 08/21/22.

## 2022-08-21 ENCOUNTER — Encounter (HOSPITAL_COMMUNITY): Payer: Medicaid Other

## 2023-01-02 ENCOUNTER — Other Ambulatory Visit (HOSPITAL_COMMUNITY): Payer: Self-pay

## 2023-03-19 ENCOUNTER — Emergency Department (HOSPITAL_COMMUNITY)
Admission: EM | Admit: 2023-03-19 | Discharge: 2023-03-19 | Disposition: A | Discharge status: Home / Self Care | Payer: Medicaid Other | Attending: Emergency Medicine | Admitting: Emergency Medicine

## 2023-03-19 ENCOUNTER — Other Ambulatory Visit: Payer: Self-pay

## 2023-03-19 ENCOUNTER — Emergency Department (HOSPITAL_COMMUNITY): Payer: Medicaid Other

## 2023-03-19 ENCOUNTER — Encounter (HOSPITAL_COMMUNITY): Payer: Self-pay

## 2023-03-19 DIAGNOSIS — N189 Chronic kidney disease, unspecified: Secondary | ICD-10-CM | POA: Diagnosis not present

## 2023-03-19 DIAGNOSIS — I5042 Chronic combined systolic (congestive) and diastolic (congestive) heart failure: Secondary | ICD-10-CM

## 2023-03-19 DIAGNOSIS — R944 Abnormal results of kidney function studies: Secondary | ICD-10-CM | POA: Insufficient documentation

## 2023-03-19 DIAGNOSIS — R079 Chest pain, unspecified: Secondary | ICD-10-CM | POA: Diagnosis present

## 2023-03-19 DIAGNOSIS — I129 Hypertensive chronic kidney disease with stage 1 through stage 4 chronic kidney disease, or unspecified chronic kidney disease: Secondary | ICD-10-CM | POA: Insufficient documentation

## 2023-03-19 DIAGNOSIS — Z79899 Other long term (current) drug therapy: Secondary | ICD-10-CM | POA: Diagnosis not present

## 2023-03-19 DIAGNOSIS — Z7982 Long term (current) use of aspirin: Secondary | ICD-10-CM | POA: Diagnosis not present

## 2023-03-19 LAB — BASIC METABOLIC PANEL
Anion gap: 11 (ref 5–15)
BUN: 22 mg/dL — ABNORMAL HIGH (ref 6–20)
CO2: 25 mmol/L (ref 22–32)
Calcium: 8.8 mg/dL — ABNORMAL LOW (ref 8.9–10.3)
Chloride: 102 mmol/L (ref 98–111)
Creatinine, Ser: 2.88 mg/dL — ABNORMAL HIGH (ref 0.61–1.24)
GFR, Estimated: 25 mL/min — ABNORMAL LOW (ref >60)
Glucose, Bld: 81 mg/dL (ref 70–99)
Potassium: 3.5 mmol/L (ref 3.5–5.1)
Sodium: 138 mmol/L (ref 135–145)

## 2023-03-19 LAB — CBC
HCT: 38.7 % — ABNORMAL LOW (ref 39.0–52.0)
Hemoglobin: 12.6 g/dL — ABNORMAL LOW (ref 13.0–17.0)
MCH: 29.4 pg (ref 26.0–34.0)
MCHC: 32.6 g/dL (ref 30.0–36.0)
MCV: 90.4 fL (ref 80.0–100.0)
Platelets: 277 10*3/uL (ref 150–400)
RBC: 4.28 MIL/uL (ref 4.22–5.81)
RDW: 16 % — ABNORMAL HIGH (ref 11.5–15.5)
WBC: 6.2 10*3/uL (ref 4.0–10.5)
nRBC: 0 % (ref 0.0–0.2)

## 2023-03-19 LAB — TROPONIN I (HIGH SENSITIVITY)
Troponin I (High Sensitivity): 12 ng/L (ref <18)
Troponin I (High Sensitivity): 12 ng/L (ref <18)

## 2023-03-19 MED ORDER — ASPIRIN 81 MG PO CHEW
324.0000 mg | CHEWABLE_TABLET | Freq: Once | ORAL | Status: AC
  Administered 2023-03-19: 324 mg via ORAL
  Filled 2023-03-19: qty 4

## 2023-03-19 MED ORDER — POTASSIUM CHLORIDE CRYS ER 20 MEQ PO TBCR
60.0000 meq | EXTENDED_RELEASE_TABLET | Freq: Two times a day (BID) | ORAL | 0 refills | Status: AC
  Filled 2023-03-19: qty 42, 7d supply, fill #0

## 2023-03-19 MED ORDER — ISOSORBIDE MONONITRATE ER 30 MG PO TB24
15.0000 mg | ORAL_TABLET | Freq: Every day | ORAL | 0 refills | Status: AC
  Filled 2023-03-19: qty 4, 8d supply, fill #0

## 2023-03-19 MED ORDER — FUROSEMIDE 10 MG/ML IJ SOLN
20.0000 mg | Freq: Once | INTRAMUSCULAR | Status: AC
  Administered 2023-03-19: 20 mg via INTRAVENOUS
  Filled 2023-03-19: qty 2

## 2023-03-19 MED ORDER — HYDRALAZINE HCL 100 MG PO TABS
100.0000 mg | ORAL_TABLET | Freq: Three times a day (TID) | ORAL | 0 refills | Status: AC
  Filled 2023-03-19: qty 21, 7d supply, fill #0

## 2023-03-19 MED ORDER — SPIRONOLACTONE 25 MG PO TABS
25.0000 mg | ORAL_TABLET | Freq: Every day | ORAL | 0 refills | Status: AC
  Filled 2023-03-19: qty 7, 7d supply, fill #0

## 2023-03-19 MED ORDER — AMLODIPINE BESYLATE 10 MG PO TABS
10.0000 mg | ORAL_TABLET | Freq: Every day | ORAL | 0 refills | Status: AC
  Filled 2023-03-19: qty 7, 7d supply, fill #0

## 2023-03-19 MED ORDER — TORSEMIDE 20 MG PO TABS
80.0000 mg | ORAL_TABLET | Freq: Two times a day (BID) | ORAL | 0 refills | Status: AC
  Filled 2023-03-19: qty 56, 7d supply, fill #0

## 2023-03-19 MED ORDER — CARVEDILOL 25 MG PO TABS
25.0000 mg | ORAL_TABLET | Freq: Two times a day (BID) | ORAL | 0 refills | Status: AC
  Filled 2023-03-19: qty 60, 30d supply, fill #0

## 2023-03-19 NOTE — ED Provider Notes (Signed)
[facility] EMERGENCY DEPARTMENT AT [facility] Provider Note   CSN: [id number] Arrival date & time: 03/19/23  1315     History  Chief Complaint  Patient presents with   Chest Pain    Scott Salinas is a 52 y.o. male.  With a past history of CKD, status post CABG, MI and hypertension who presents to the ED for chest pain.  Patient reports ongoing chest pain for the last 4 days.  He has had some associated shortness of breath and feels as though there is fluid accumulating in his abdomen.  Shortness of breath is worse with exertion.  He used to live here but is visiting from [place] where he now lives.  Has an upcoming appointment with his nephrologist next week.  Denies nausea vomiting fevers chills and recent illness.   Chest Pain      Home Medications Prior to Admission medications   Medication Sig Start Date End Date Taking? Authorizing Provider  amLODipine (NORVASC) 10 MG tablet Take 1 tablet (10 mg total) by mouth daily for 7 days. 03/19/23 03/26/23  Royanne Foots, DO  aspirin EC 81 MG tablet Take 1 tablet (81 mg total) by mouth daily. Swallow whole. 12/21/21   [place], Anderson Malta, FNP  carvedilol (COREG) 25 MG tablet Take 1 tablet (25 mg total) by mouth 2 (two) times daily for 7 days. 03/19/23 03/26/23  Royanne Foots, DO  doxycycline (VIBRAMYCIN) 100 MG capsule Take 1 capsule (100 mg total) by mouth 2 (two) times daily. 05/05/22   Gust Brooms, MD  Evolocumab (REPATHA SURECLICK) 140 MG/ML SOAJ Inject 140 mg into the skin every 14 (fourteen) days. 05/11/22   Hilty, Lisette Abu, MD  hydrALAZINE (APRESOLINE) 100 MG tablet Take 1 tablet (100 mg total) by mouth 3 (three) times daily for 7 days. 03/19/23 03/26/23  Royanne Foots, DO  isosorbide mononitrate (IMDUR) 30 MG 24 hr tablet Take 1/2 tablet (15 mg total) by mouth daily. 03/19/23 03/26/23  Royanne Foots, DO  oxyCODONE (OXY IR/ROXICODONE) 5 MG immediate release tablet Take 1 tablet (5 mg total) by mouth every  6 (six) hours as needed for pain 05/19/22     potassium chloride SA (KLOR-CON M) 20 MEQ tablet Take 3 tablets (60 mEq total) by mouth 2 (two) times daily for 7 days. 03/19/23 03/26/23  Royanne Foots, DO  spironolactone (ALDACTONE) 25 MG tablet Take 1 tablet (25 mg total) by mouth daily for 7 days. 03/19/23 03/26/23  Royanne Foots, DO  sulfamethoxazole-trimethoprim (BACTRIM DS) 800-160 MG tablet Take 1 tablet by mouth 2 (two) times daily. 05/19/22     torsemide (DEMADEX) 20 MG tablet Take 4 tablets (80 mg total) by mouth 2 (two) times daily for 7 days. 03/19/23 03/26/23  Royanne Foots, DO      Allergies    Lisinopril    Review of Systems   Review of Systems  Cardiovascular:  Positive for chest pain.    Physical Exam Updated Vital Signs BP (!) 178/138   Pulse 75   Temp 98.3 F (36.8 C) (Oral)   Resp 19   SpO2 99%  Physical Exam Vitals and nursing note reviewed.  HENT:     Head: Normocephalic and atraumatic.  Eyes:     Pupils: Pupils are equal, round, and reactive to light.  Cardiovascular:     Rate and Rhythm: Normal rate and regular rhythm.  Pulmonary:     Effort: Pulmonary effort is normal.  Breath sounds: Normal breath sounds.  Abdominal:     Palpations: Abdomen is soft.     Tenderness: There is no abdominal tenderness.  Skin:    General: Skin is warm and dry.  Neurological:     Mental Status: He is alert.  Psychiatric:        Mood and Affect: Mood normal.     ED Results / Procedures / Treatments   Labs (all labs ordered are listed, but only abnormal results are displayed) Labs Reviewed  BASIC METABOLIC PANEL - Abnormal; Notable for the following components:      Result Value   BUN 22 (*)    Creatinine, Ser 2.88 (*)    Calcium 8.8 (*)    GFR, Estimated 25 (*)    All other components within normal limits  CBC - Abnormal; Notable for the following components:   Hemoglobin 12.6 (*)    HCT 38.7 (*)    RDW 16.0 (*)    All other components within  normal limits  TROPONIN I (HIGH SENSITIVITY)  TROPONIN I (HIGH SENSITIVITY)    EKG EKG Interpretation Date/Time:  Monday March 19 2023 13:31:26 EST Ventricular Rate:  87 PR Interval:  176 QRS Duration:  118 QT Interval:  414 QTC Calculation: 498 R Axis:   55  Text Interpretation: Normal sinus rhythm with sinus arrhythmia Left ventricular hypertrophy with QRS widening and repolarization abnormality ( Cornell product ) Prolonged QT Abnormal ECG When compared with ECG of 10-May-2022 14:57, PREVIOUS ECG IS PRESENT Confirmed by Estelle June [phone]) on 03/19/2023 7:27:07 PM  Radiology DG Chest 2 View  Result Date: 03/19/2023 CLINICAL DATA:  Chest pain and shortness of breath for several days. Coronary artery disease. EXAM: CHEST - 2 VIEW COMPARISON:  10/25/2019 FINDINGS: Stable mild cardiomegaly. Prior CABG again noted. Both lungs are clear. IMPRESSION: Mild cardiomegaly. No active lung disease. Electronically Signed   By: Danae Orleans M.D.   On: 03/19/2023 16:16    Procedures Procedures    Medications Ordered in ED Medications  furosemide (LASIX) injection 20 mg (has no administration in time range)  aspirin chewable tablet 324 mg (324 mg Oral Given 03/19/23 1700)    ED Course/ Medical Decision Making/ A&P Clinical Course as of 03/19/23 1934  Mon Mar 19, 2023  1922 Initial troponin of 12 with delta troponin also of 12.  Low suspicion for ACS.  No ischemic changes on EKG.  Renal function is slightly elevated and I discussed this with the patient.  He will follow-up with his nephrologist next week when he gets back to [facility].  Declined offer for admission at this time.  Chest pain has resolved.  He is requesting a dose of Lasix here since she has not taken his torsemide in the last few days.  Will give him a dose of Lasix here and discharge him with refills of his home meds for the next week until he gets back home to [place].  He has remained stable on room air [MP]    Clinical  Course User Index [MP] Royanne Foots, DO                                 Medical Decision Making 52 year old male with history as above presenting for chest pain shortness of breath x 4 days.  Significant cardiac history.  Benign physical exam with no adventitious lung sounds or peripheral edema.  Will evaluate for ACS  with high-sensitivity troponin and EKG.  Concerning for acute pulmonary edema with a history of CKD.  Will obtain laboratory workup and chest x-ray.  He is visiting the area from [place] and has not taken his prescribed medications including antihypertensives and diuretics in the last 4 days which would be the most likely reason for his presentation here today.  If his workup looks okay and he does not require admission he will need refills.  Amount and/or Complexity of Data Reviewed Labs: ordered. Radiology: ordered.  Risk OTC drugs. Prescription drug management.           Final Clinical Impression(s) / ED Diagnoses Final diagnoses:  Chest pain, unspecified type  Chronic kidney disease, unspecified CKD stage    Rx / DC Orders ED Discharge Orders          Ordered    torsemide (DEMADEX) 20 MG tablet  2 times daily        03/19/23 1933    spironolactone (ALDACTONE) 25 MG tablet  Daily       Note to Pharmacy: Please cancel all previous orders for current medication. Change in dosage or pill size.   03/19/23 1933    potassium chloride SA (KLOR-CON M) 20 MEQ tablet  2 times daily        03/19/23 1933    isosorbide mononitrate (IMDUR) 30 MG 24 hr tablet  Daily        03/19/23 1933    hydrALAZINE (APRESOLINE) 100 MG tablet  3 times daily        03/19/23 1933    carvedilol (COREG) 25 MG tablet  2 times daily       Note to Pharmacy: [facility], Please cancel all previous orders for current medication. Change in dosage or pill size.   03/19/23 1933    amLODipine (NORVASC) 10 MG tablet  Daily        03/19/23 1933              Royanne Foots, DO 03/19/23 1934

## 2023-03-19 NOTE — ED Notes (Signed)
Oxygen removed from pt 

## 2023-03-19 NOTE — ED Triage Notes (Signed)
Pt c.o sob since Friday and then chest pains came on Saturday, symptoms get worse when he exerts himself. Hx of CHF and bypass. Pt has been out of his meds since Friday because the airport lost his luggage, pt is from [place].

## 2023-03-19 NOTE — Discharge Instructions (Addendum)
You were seen in the emerged department for chest pain and shortness of breath Your blood work and EKG looked okay Your kidney numbers are up slightly from the last numbers we have in our system Is important that you follow-up with the kidney doctor when you get back to [place] We have called in 1 week worth of medication for you to pick up from your pharmacy and continue taking as directed until you are back home in [place] Return to the emerged department for chest pain shortness of breath or any other concerns

## 2023-03-20 ENCOUNTER — Other Ambulatory Visit (HOSPITAL_COMMUNITY): Payer: Self-pay
